# Patient Record
Sex: Female | Born: 1965 | Race: Black or African American | Hispanic: No | Marital: Single | Smoking: Former smoker
Health system: Southern US, Community
[De-identification: ages and names within clinical notes are randomized; demographics above are authoritative.]

## PROBLEM LIST (undated history)

## (undated) DIAGNOSIS — C8191 Hodgkin lymphoma, unspecified, lymph nodes of head, face, and neck: Secondary | ICD-10-CM

## (undated) DIAGNOSIS — R599 Enlarged lymph nodes, unspecified: Secondary | ICD-10-CM

## (undated) DIAGNOSIS — M62838 Other muscle spasm: Secondary | ICD-10-CM

## (undated) DIAGNOSIS — Z1231 Encounter for screening mammogram for malignant neoplasm of breast: Secondary | ICD-10-CM

## (undated) DIAGNOSIS — R3915 Urgency of urination: Secondary | ICD-10-CM

## (undated) DIAGNOSIS — I10 Essential (primary) hypertension: Secondary | ICD-10-CM

## (undated) DIAGNOSIS — Z01811 Encounter for preprocedural respiratory examination: Secondary | ICD-10-CM

## (undated) DIAGNOSIS — R221 Localized swelling, mass and lump, neck: Secondary | ICD-10-CM

## (undated) DIAGNOSIS — R7303 Prediabetes: Secondary | ICD-10-CM

## (undated) DIAGNOSIS — Z452 Encounter for adjustment and management of vascular access device: Secondary | ICD-10-CM

## (undated) DIAGNOSIS — IMO0002 Reserved for concepts with insufficient information to code with codable children: Secondary | ICD-10-CM

## (undated) DIAGNOSIS — Z13220 Encounter for screening for lipoid disorders: Secondary | ICD-10-CM

## (undated) DIAGNOSIS — R52 Pain, unspecified: Secondary | ICD-10-CM

## (undated) DIAGNOSIS — Z01818 Encounter for other preprocedural examination: Secondary | ICD-10-CM

## (undated) DIAGNOSIS — C8590 Non-Hodgkin lymphoma, unspecified, unspecified site: Secondary | ICD-10-CM

## (undated) DIAGNOSIS — M79674 Pain in right toe(s): Secondary | ICD-10-CM

## (undated) DIAGNOSIS — D649 Anemia, unspecified: Secondary | ICD-10-CM

## (undated) DIAGNOSIS — K219 Gastro-esophageal reflux disease without esophagitis: Secondary | ICD-10-CM

## (undated) DIAGNOSIS — R220 Localized swelling, mass and lump, head: Secondary | ICD-10-CM

## (undated) DIAGNOSIS — N893 Dysplasia of vagina, unspecified: Secondary | ICD-10-CM

## (undated) DIAGNOSIS — E669 Obesity, unspecified: Principal | ICD-10-CM

## (undated) DIAGNOSIS — C801 Malignant (primary) neoplasm, unspecified: Secondary | ICD-10-CM

## (undated) DIAGNOSIS — T8859XA Other complications of anesthesia, initial encounter: Secondary | ICD-10-CM

## (undated) DIAGNOSIS — J45909 Unspecified asthma, uncomplicated: Secondary | ICD-10-CM

## (undated) HISTORY — PX: PORTACATH PLACEMENT: SHX2246

## (undated) HISTORY — PX: OTHER SURGICAL HISTORY: SHX169

## (undated) HISTORY — PX: ABDOMINAL HYSTERECTOMY: SHX81

---

## 2009-03-24 LAB — HM PAP SMEAR

## 2009-03-25 LAB — HEPATITIS B SURFACE ANTIBODY
Hep B S Ab Interp: NEGATIVE — AB
Hep B S Ab: <0.1 Index — ABNORMAL LOW (ref >1.00)

## 2009-03-25 LAB — RPR
RPR: NONREACTIVE
RPR: NONREACTIVE

## 2009-03-25 LAB — HEP B SURFACE AB
Hep B surface Ab Interp.: NEGATIVE — AB
Hepatitis B surface Ab: <0.1 Index — ABNORMAL LOW (ref >1.00)

## 2009-03-25 LAB — HEP B SURFACE AG
Hep B surface Ag Interp.: NEGATIVE
Hepatitis B surface Ag: <0.1 Index (ref <1.00)

## 2009-03-27 LAB — HSV TYPE 2-SPECIFIC ABS, IGG W/REFL SUPPLEMENTAL TESTING: HSV-2 Glycoprotein, IgG: 6.4 IV

## 2009-05-13 LAB — HIV 1/2 AB SCREEN W RFLX CONFIRM
HIV 1/2 Interpretation: NONREACTIVE
HIV1/2 INTERPRETATION, HHIVI: NONREACTIVE

## 2011-04-24 MED ORDER — SERTRALINE 50 MG TAB
50 mg | ORAL_TABLET | Freq: Every day | ORAL | Status: DC
Start: 2011-04-24 — End: 2012-05-26

## 2011-04-24 NOTE — Progress Notes (Signed)
Subjective:   45 y.o. female for annual routine Pap and checkup.  No LMP recorded. Patient has had a hysterectomy.    Social History: has sex with males.  Pertinent past medical hstory: see below.    There are no active problems to display for this patient.    No past medical history on file.  Past Surgical History   Procedure Date   ??? Hx gyn      BTL   ??? Hx hysterectomy 2004     History   Substance Use Topics   ??? Smoking status: Current Everyday Smoker   ??? Smokeless tobacco: Not on file   ??? Alcohol Use: Yes        ROS:  Feeling well. No dyspnea or chest pain on exertion.  No abdominal pain, change in bowel habits, black or bloody stools.  No urinary tract symptoms. GYN ROS: no menses, no abnormal bleeding, pelvic pain or discharge, no breast pain or new or enlarging lumps on self exam. No neurological complaints.    Objective:   BP 150/97   Pulse 90   Wt 176 lb (79.833 kg)  The patient appears well, alert, oriented x 3, in no distress.  ENT normal.  Neck supple. No adenopathy or thyromegaly. PERLA. Lungs are clear, good air entry, no wheezes, rhonchi or rales. S1 and S2 normal, no murmurs, regular rate and rhythm. Abdomen soft without tenderness, guarding, mass or organomegaly. Extremities show no edema, normal peripheral pulses. Neurological is normal, no focal findings.    BREAST EXAM: breasts appear normal, no suspicious masses, no skin or nipple changes or axillary nodes    PELVIC EXAM: normal external genitalia, vulva, vagina, and adnexa; cx/uterus absent    Assessment/Plan:   45 yo g5p5 well woman  mammogram  pap smear of vagina  zoloft 50mg  po daily for anxiety; if not improved in 2 weeks, will increase to 100mg   return annually or prn  reviewed diet, exercise and weight control.

## 2011-04-24 NOTE — Progress Notes (Signed)
Addended by: Luanne Bras on: 04/24/2011 02:44 PM     Modules accepted: Level of Service

## 2011-04-24 NOTE — Patient Instructions (Addendum)
MyChart Activation    Thank you for requesting access to MyChart. Please follow the instructions below to securely access and download your online medical record. MyChart allows you to send messages to your doctor, view your test results, renew your prescriptions, schedule appointments, and more.    How Do I Sign Up?    1. In your internet browser, go to [email or web address]  2. Click on the First Time User? Click Here link in the Sign In box. You will be redirect to the New Member Sign Up page.  3. Enter your MyChart Access Code exactly as it appears below. You will not need to use this code after you???ve completed the sign-up process. If you do not sign up before the expiration date, you must request a new code.    MyChart Access Code: [email or web address]  Expires: 07/23/2011 11:49 AM (This is the date your MyChart access code will expire)    4. Enter the last four digits of your Social Security Number (xxxx) and Date of Birth (mm/dd/yyyy) as indicated and click Submit. You will be taken to the next sign-up page.  5. Create a MyChart ID. This will be your MyChart login ID and cannot be changed, so think of one that is secure and easy to remember.  6. Create a MyChart password. You can change your password at any time.  7. Enter your Password Reset Question and Answer. This can be used at a later time if you forget your password.   8. Enter your e-mail address. You will receive e-mail notification when new information is available in MyChart.  9. Click Sign Up. You can now view and download portions of your medical record.  10. Click the Download Summary menu link to download a portable copy of your medical information.    Additional Information    If you have questions, please visit the Frequently Asked Questions section of the MyChart website at [email or web address] Remember, MyChart is NOT to be used for urgent needs. For medical emergencies, dial 911.        Pap Test: After Your Visit   Your Care Instructions  The Pap test (also called a Pap smear) is a screening test for cancer of the cervix, which is the lower part of the uterus that opens into the vagina. The test can help your doctor find early changes in the cells that could lead to cancer.  The sample of cells taken during your test has been sent to a lab so that an expert can look at the cells. It usually takes a week or two to get the results back.  Follow-up care is a key part of your treatment and safety. Be sure to make and go to all appointments, and call your doctor if you are having problems. It???s also a good idea to know your test results and keep a list of the medicines you take.  What do the results mean?  ?? A normal result means that the test did not find any abnormal cells in the sample.  ?? An abnormal result can mean many things. Most of these are not cancer. The results of your test may be abnormal because:  ?? You have an infection of the vagina or cervix, such as a yeast infection.  ?? You have an IUD (intrauterine device for birth control).  ?? You have low estrogen levels after menopause that are causing the cells to change.  ?? You have cell changes that may be a sign of precancer  or cancer. The results are ranked based on how serious the changes might be.  There are many other reasons why you might not get a normal result. If the results were abnormal, you may need to get another test within a few weeks or months. If the results show changes that could be a sign of cancer, you may need a test called a colposcopy, which provides a more complete view of the cervix.  Sometimes the lab cannot use the sample because it does not contain enough cells or was not preserved well. If so, you may need to have the test again. This is not common, but it does happen from time to time.  When should you call for help?  Watch closely for changes in your health, and be sure to contact your doctor if:   ?? You have vaginal bleeding or pain for more than 2 days after the test. It is normal to have a small amount of bleeding for a day or two after the test.   Where can you learn more?   Go to [email or web address]  Enter [phone] in the search box to learn more about "Pap Test: After Your Visit."   ?? 2006-2012 Healthwise, Incorporated. Care instructions adapted under license by [employer] (which disclaims liability or warranty for this information). This care instruction is for use with your licensed healthcare professional. If you have questions about a medical condition or this instruction, always ask your healthcare professional. Health[employer], Incorporated disclaims any warranty or liability for your use of this information.  Content Version: 9.4.94723; Last Revised: January 30, 2010              Breast Cancer Screening: After Your Visit  Your Care Instructions  A breast X-ray (mammogram) and an exam by your doctor can help find breast cancer early, when it is easier to treat. If you are age 44 or older, ask your doctor when to start and how often to have a mammogram. The X-ray can spot tumors that are too small to be felt by hand. (It also can show harmless lumps, such as fluid-filled cysts).  During a breast exam, your doctor will feel your breasts for unusual lumps or any other possible signs of cancer. During a mammogram, a machine squeezes your breasts to make them flatter and easier to X-ray. You may feel some brief discomfort during the test. After the test, a doctor who is an expert at reading X-rays will study your mammogram. Your doctor will tell you the results and whether you need any follow-up tests.  Follow-up care is a key part of your treatment and safety. Be sure to make and go to all appointments, and call your doctor if you are having problems. It???s also a good idea to know your test results and keep a list of the medicines you take.  What should you do to get ready for a mammogram?   ?? If you are still having menstrual periods, have your mammogram done within 2 weeks after your menstrual period ends. The test will be more comfortable at this time than it would be before your period.  ?? On the day of the mammogram, do not use any deodorant, perfume, powders, or lotions on your breasts or armpits. They may affect the X-rays.  ?? Remove any jewelry. You will need to take off your clothes above the waist. You will put on a cloth or paper top. If you are concerned about an area of your breast,  show the technologist so that the area can be noted.  How can you care for yourself at home?  ?? If you have breast pain after the mammogram, take an over-the-counter pain medicine, such as acetaminophen (Tylenol), ibuprofen (Advil, Motrin), or naproxen (Aleve). Read and follow all instructions on the label.  ?? Do not take two or more pain medicines at the same time unless the doctor told you to. Many pain medicines have acetaminophen, which is Tylenol. Too much acetaminophen (Tylenol) can be harmful.  When should you call for help?  Watch closely for changes in your health, and be sure to contact your doctor if:  ?? You notice any changes in your breasts or the skin on your breasts. These may include lumps, fluid leaking suddenly from your nipples, or changes to the skin on your breast or nipple.   Where can you learn more?   Go to [email or web address]  Enter H706 in the search box to learn more about "Breast Cancer Screening: After Your Visit."   ?? 2006-2012 Healthwise, Incorporated. Care instructions adapted under license by [employer] (which disclaims liability or warranty for this information). This care instruction is for use with your licensed healthcare professional. If you have questions about a medical condition or this instruction, always ask your healthcare professional. Healthwise, Incorporated disclaims any warranty or liability for your use of this information.   Content Version: 9.4.94723; Last Revised: February 09, 2010

## 2011-04-26 NOTE — Progress Notes (Signed)
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 Please schedule for colposcopy of vagina. thanks  ______

## 2011-04-26 NOTE — Telephone Encounter (Signed)
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  Please schedule for colposcopy of vagina.  thanks

## 2011-04-26 NOTE — Telephone Encounter (Signed)
Left message on patient voicemail  to call the office.

## 2011-05-03 MED ORDER — HYDROCHLOROTHIAZIDE 25 MG TAB
25 mg | ORAL_TABLET | Freq: Every day | ORAL | Status: DC
Start: 2011-05-03 — End: 2011-05-20

## 2011-05-03 NOTE — Progress Notes (Signed)
HISTORY OF PRESENT ILLNESS    Melissa Leblanc is a 45 y.o. year old female who comes in today as a new patient to our practice to be seen for: Elevated blood pressure.    Hypertension ROS: no TIA's, no chest pain on exertion, no dyspnea on exertion, no swelling of ankles.   New concerns: none.   Past medications tried and failed none.    Does have a lot of stress and was started on Zoloft by OB about 1-2 weeks ago.    Current Outpatient Prescriptions on File Prior to Visit   Medication Sig Dispense Refill   ??? sertraline (ZOLOFT) 50 mg tablet Take 1 Tab by mouth daily.  90 Tab  5     Objective:   BP 142/100   Pulse 69   Temp(Src) 99 ??F (37.2 ??C) (Oral)   Resp 16   Ht 5\' 5"  (1.651 m)   Wt 176 lb (79.833 kg)   BMI 29.29 kg/m2   SpO2 98%  GEN:  Appears stated age in NAD.  HEENT: Conjunctiva/lids normal.  External ears and nose without lesions/trauma.  Hearing Intact.  Tongue midline.  NECK: Trachea midline.  Supple.  Full ROM  CARDIAC:  regular rate and rhythm. no Murmur, no peripheral edema.  LUNGS: lungs clear to auscultation, no accessory muscle use.  MS: no clubbing/cyanosis.  SKIN: Warm/dry without rash.    Assessment/Plan:  Encounter Diagnoses   Name Primary?   ??? HTN (hypertension) Yes   ??? Well woman exam      Orders Placed This Encounter   ??? LIPID PANEL     Standing Status: Future      Number of Occurrences:       Standing Expiration Date: 10/31/2011   ??? METABOLIC PANEL, COMPREHENSIVE     Standing Status: Future      Number of Occurrences:       Standing Expiration Date: 10/31/2011   ??? CBC WITH AUTOMATED DIFF   ??? hydrochlorothiazide (HYDRODIURIL) 25 mg tablet     Sig: Take 1 Tab by mouth daily.     Dispense:  30 Tab     Refill:  1     Will give DASH diet and discussed smoking cessation.    RTC 2 weeks.    Labs done fasting in 1 week.

## 2011-05-03 NOTE — Patient Instructions (Addendum)
DASH Diet: After Your Visit  Your Care Instructions  The DASH diet is an eating plan that can help lower your blood pressure. DASH stands for Dietary Approaches to Stop Hypertension. Hypertension is high blood pressure.  The DASH diet focuses on eating foods that are high in calcium, potassium, and magnesium. These nutrients can lower blood pressure. The foods that are highest in these nutrients are fruits, vegetables, low-fat dairy products, nuts, seeds, and legumes. But taking calcium, potassium, and magnesium supplements instead of eating foods that are high in those nutrients does not have the same effect. The DASH diet also includes whole grains, fish, and poultry.  The DASH diet is one of several lifestyle changes your doctor may recommend to lower your high blood pressure. Your doctor may also want you to decrease the amount of sodium in your diet. Lowering sodium while following the DASH diet can lower blood pressure even further than just the DASH diet alone.  Follow-up care is a key part of your treatment and safety. Be sure to make and go to all appointments, and call your doctor if you are having problems. It???s also a good idea to know your test results and keep a list of the medicines you take.  How can you care for yourself at home?  Following the DASH diet  ?? Eat 4 to 5 servings of fruit each day. A serving is 1 medium-sized piece of fruit, ?? cup chopped or canned fruit, 1/4 cup dried fruit, or 4 ounces (?? cup) of fruit juice. Choose fruit more often than fruit juice.   ?? Eat 4 to 5 servings of vegetables each day. A serving is 1 cup of lettuce or raw leafy vegetables, ?? cup of chopped or cooked vegetables, or 4 ounces (?? cup) of vegetable juice. Choose vegetables more often than vegetable juice.   ?? Get 2 to 3 servings of low-fat and fat-free dairy each day. A serving is 8 ounces of milk, 1 cup of yogurt, or 1 ?? ounces of cheese.    ?? Eat 7 to 8 servings of grains each day. A serving is 1 slice of bread, 1 ounce of dry cereal, or ?? cup of cooked rice, pasta, or cooked cereal. Try to choose whole-grain products as much as possible.   ?? Limit lean meat, poultry, and fish to 6 ounces each day. Six ounces is about the size of two decks of cards.   ?? Eat 4 to 5 servings of nuts, seeds, and legumes (cooked dried beans, lentils, and split peas) each week. A serving is 1/3 cup of nuts, 2 tablespoons of seeds, or ?? cup cooked dried beans or peas.   ?? Limit sweets and added sugars to 5 servings or less a week. A serving is 1 tablespoon jelly or jam, ?? cup sorbet, or 1 cup of lemonade.   Tips for success  ?? Start small. Do not try to make dramatic changes to your diet all at once. You might feel that you are missing out on your favorite foods and then be more likely to not follow the plan. Make small changes, and stick with them. Once those changes become habit, add a few more changes.   ?? Try some of the following:   ?? Make it a goal to eat a fruit or vegetable at every meal and at snacks. This will make it easy to get the recommended amount of fruits and vegetables each day.   ?? Try yogurt topped with   fruit and nuts for a snack or healthy dessert.   ?? Add lettuce, tomato, cucumber, and onion to sandwiches.   ?? Combine a ready-made pizza crust with low-fat mozzarella cheese and lots of vegetable toppings. Try using tomatoes, squash, spinach, broccoli, carrots, cauliflower, and onions.   ?? Have a variety of cut-up vegetables with a low-fat dip as an appetizer instead of chips and dip.   ?? Sprinkle sunflower seeds or chopped almonds over salads. Or try adding chopped walnuts or almonds to cooked vegetables.   ?? Try some vegetarian meals using beans and peas. Add garbanzo or kidney beans to salads. Make burritos and tacos with mashed pinto beans or black beans.     Where can you learn more?    Go to http://www.healthwise.net/[facility]    Enter H967 in the search box to learn more about "DASH Diet: After Your Visit."    ?? 2006-2012 Healthwise, Incorporated. Care instructions adapted under license by [employer] (which disclaims liability or warranty for this information). This care instruction is for use with your licensed healthcare professional. If you have questions about a medical condition or this instruction, always ask your healthcare professional. Healthwise, Incorporated disclaims any warranty or liability for your use of this information.  Content Version: 9.4.94723; Last Revised: November 01, 2010

## 2011-05-08 LAB — METABOLIC PANEL, COMPREHENSIVE
A-G Ratio: 1.2 (ref 1.1–2.5)
ALT (SGPT): 12 IU/L (ref 0–40)
AST (SGOT): 14 IU/L (ref 0–40)
Albumin: 4.2 g/dL (ref 3.5–5.5)
Alk. phosphatase: 65 IU/L (ref 25–150)
BUN/Creatinine ratio: 11 (ref 9–23)
BUN: 8 mg/dL (ref 6–24)
Bilirubin, total: 0.2 mg/dL (ref 0.0–1.2)
CO2: 22 mmol/L (ref 20–32)
Calcium: 9.9 mg/dL (ref 8.7–10.2)
Chloride: 101 mmol/L (ref 97–108)
Creatinine: 0.74 mg/dL (ref 0.57–1.00)
GFR est non-AA: 99 mL/min/{1.73_m2} (ref >59)
GLOBULIN, TOTAL: 3.5 g/dL (ref 1.5–4.5)
Glucose: 106 mg/dL — ABNORMAL HIGH (ref 65–99)
Potassium: 4.8 mmol/L (ref 3.5–5.2)
Protein, total: 7.7 g/dL (ref 6.0–8.5)
Sodium: 140 mmol/L (ref 134–144)
eGFR If African American: 114 mL/min/{1.73_m2} (ref >59)

## 2011-05-08 LAB — LIPID PANEL
Cholesterol, total: 207 mg/dL — ABNORMAL HIGH (ref 100–199)
HDL Cholesterol: 53 mg/dL (ref >39)
LDL, calculated: 130 mg/dL — ABNORMAL HIGH (ref 0–99)
Triglyceride: 121 mg/dL (ref 0–149)
VLDL, calculated: 24 mg/dL (ref 5–40)

## 2011-05-08 NOTE — Telephone Encounter (Signed)
Called patient left message on her home number reminding her to keep her 05/17/11 appointment so that Dr. Sidney Ace can recheck her HTN and also go over her lab results that day.

## 2011-05-08 NOTE — Telephone Encounter (Signed)
Message copied by Melina Schools on Wed May 08, 2011  8:42 AM  ------       Message from: [facility]       Created: Wed May 08, 2011  8:17 AM         Please let Pt know needs to return for appt to discuss lab results (prediabetes, lipids).

## 2011-05-08 NOTE — Progress Notes (Signed)
Quick Note:    Patient has follow up on 05/17/11, called and reminded pt to make sure she makes it.  ______

## 2011-05-10 NOTE — Progress Notes (Signed)
45 yo g5p5 presents for colposcopy.  Recent pap of vagina - vain 1    Pe: There were no vitals taken for this visit.  Nad, rrr, ctab, abd soft nt nd  Extr: no c/c/e  V/v: awe at after application of acetic acid.  bx taken at vaginal cuff approx 11:00    A/P: 45 yo g5p5 with vain  1.  bx pending.  F/u with pap in 6 months; repeat colpo if vain2 +    jc

## 2011-05-10 NOTE — Patient Instructions (Addendum)
MyChart Activation    Thank you for requesting access to MyChart. Please follow the instructions below to securely access and download your online medical record. MyChart allows you to send messages to your doctor, view your test results, renew your prescriptions, schedule appointments, and more.    How Do I Sign Up?    1. In your internet browser, go to [email or web address]  2. Click on the First Time User? Click Here link in the Sign In box. You will be redirect to the New Member Sign Up page.  3. Enter your MyChart Access Code exactly as it appears below. You will not need to use this code after you???ve completed the sign-up process. If you do not sign up before the expiration date, you must request a new code.    MyChart Access Code: [email or web address]  Expires: 07/23/2011 11:49 AM (This is the date your MyChart access code will expire)    4. Enter the last four digits of your Social Security Number (xxxx) and Date of Birth (mm/dd/yyyy) as indicated and click Submit. You will be taken to the next sign-up page.  5. Create a MyChart ID. This will be your MyChart login ID and cannot be changed, so think of one that is secure and easy to remember.  6. Create a MyChart password. You can change your password at any time.  7. Enter your Password Reset Question and Answer. This can be used at a later time if you forget your password.   8. Enter your e-mail address. You will receive e-mail notification when new information is available in MyChart.  9. Click Sign Up. You can now view and download portions of your medical record.  10. Click the Download Summary menu link to download a portable copy of your medical information.    Additional Information    If you have questions, please visit the Frequently Asked Questions section of the MyChart website at [email or web address] Remember, MyChart is NOT to be used for urgent needs. For medical emergencies, dial 911.      Colposcopy: What to Expect at Home   Your Recovery  You may feel some soreness in your vagina for a day or two if you had a biopsy. Some vaginal bleeding or discharge is normal for up to a week after a biopsy. The discharge may be dark-colored if a solution was put on your cervix. You can use a sanitary pad for the bleeding.  It may take a week or two for you to get the test results.  This care sheet gives you a general idea about how long it will take for you to recover. But each person recovers at a different pace. Follow the steps below to feel better as quickly as possible.  How can you care for yourself at home?  Activity  ?? You can return to work and most daily activities right after the test.   Exercise  ?? Do not exercise for 1 day after the test.   Medicines  ?? Take an over-the-counter pain medicine, such as acetaminophen (Tylenol), ibuprofen (Advil, Motrin), or naproxen (Aleve). Read and follow all instructions on the label. Do not take two or more pain medicines at the same time unless the doctor told you to. Many pain medicines have acetaminophen, which is Tylenol. Too much acetaminophen (Tylenol) can be harmful.   Other instructions  ?? Use a pad if you have some bleeding.   ?? Do not douche, have sexual intercourse, or use tampons for 1 week  if you had a biopsy. This will allow time for your cervix to heal.   ?? You can take a bath or shower anytime after the test.   Follow-up care is a key part of your treatment and safety. Be sure to make and go to all appointments, and call your doctor if you are having problems. It's also a good idea to know your test results and keep a list of the medicines you take.  When should you call for help?  Call your doctor now or seek immediate medical care if:  ?? You have severe vaginal bleeding. You are passing clots of blood and soaking through your usual pads or tampons each hour for 2 or more hours.   ?? You have pain that does not get better after you take pain medicine.    ?? You have signs of infection, such as:   ?? Increased pain.   ?? Bad-smelling vaginal discharge.   ?? A fever.   Watch closely for any changes in your health, and be sure to contact your doctor if:  ?? You have questions or concerns.     Where can you learn more?    Go to [email or web address]   Enter M523 in the search box to learn more about "Colposcopy: What to Expect at Home."    ?? 2006-2012 Healthwise, Incorporated. Care instructions adapted under license by [employer] (which disclaims liability or warranty for this information). This care instruction is for use with your licensed healthcare professional. If you have questions about a medical condition or this instruction, always ask your healthcare professional. Healthwise, Incorporated disclaims any warranty or liability for your use of this information.  Content Version: 9.4.94723; Last Revised: April 20, 2010

## 2011-05-14 NOTE — Progress Notes (Signed)
Quick Note:    Vaginal bx is normal  ______

## 2011-05-16 NOTE — Telephone Encounter (Signed)
Message copied by Gardiner Rhyme on Thu May 16, 2011  2:16 PM  ------       Message from: Sheran Luz B       Created: Tue May 14, 2011  5:55 PM         Vaginal bx is normal

## 2011-05-16 NOTE — Telephone Encounter (Signed)
Message copied by Gardiner Rhyme on Thu May 16, 2011  9:39 AM  ------       Message from: Sheran Luz B       Created: Tue May 14, 2011  5:55 PM         Vaginal bx is normal

## 2011-05-16 NOTE — Telephone Encounter (Signed)
Spoke with patient informed her that her cervical biopsy was normal. Pt verbalized understanding.

## 2011-05-16 NOTE — Telephone Encounter (Signed)
Left message on patient voicemail to call the office.

## 2011-05-20 MED ORDER — LISINOPRIL-HYDROCHLOROTHIAZIDE 20 MG-25 MG TAB
20-25 mg | ORAL_TABLET | Freq: Every day | ORAL | Status: DC
Start: 2011-05-20 — End: 2011-09-10

## 2011-05-20 NOTE — Patient Instructions (Signed)
DASH Diet: After Your Visit  Your Care Instructions  The DASH diet is an eating plan that can help lower your blood pressure. DASH stands for Dietary Approaches to Stop Hypertension. Hypertension is high blood pressure.  The DASH diet focuses on eating foods that are high in calcium, potassium, and magnesium. These nutrients can lower blood pressure. The foods that are highest in these nutrients are fruits, vegetables, low-fat dairy products, nuts, seeds, and legumes. But taking calcium, potassium, and magnesium supplements instead of eating foods that are high in those nutrients does not have the same effect. The DASH diet also includes whole grains, fish, and poultry.  The DASH diet is one of several lifestyle changes your doctor may recommend to lower your high blood pressure. Your doctor may also want you to decrease the amount of sodium in your diet. Lowering sodium while following the DASH diet can lower blood pressure even further than just the DASH diet alone.  Follow-up care is a key part of your treatment and safety. Be sure to make and go to all appointments, and call your doctor if you are having problems. It???s also a good idea to know your test results and keep a list of the medicines you take.  How can you care for yourself at home?  Following the DASH diet  ?? Eat 4 to 5 servings of fruit each day. A serving is 1 medium-sized piece of fruit, ?? cup chopped or canned fruit, 1/4 cup dried fruit, or 4 ounces (?? cup) of fruit juice. Choose fruit more often than fruit juice.   ?? Eat 4 to 5 servings of vegetables each day. A serving is 1 cup of lettuce or raw leafy vegetables, ?? cup of chopped or cooked vegetables, or 4 ounces (?? cup) of vegetable juice. Choose vegetables more often than vegetable juice.   ?? Get 2 to 3 servings of low-fat and fat-free dairy each day. A serving is 8 ounces of milk, 1 cup of yogurt, or 1 ?? ounces of cheese.    ?? Eat 7 to 8 servings of grains each day. A serving is 1 slice of bread, 1 ounce of dry cereal, or ?? cup of cooked rice, pasta, or cooked cereal. Try to choose whole-grain products as much as possible.   ?? Limit lean meat, poultry, and fish to 6 ounces each day. Six ounces is about the size of two decks of cards.   ?? Eat 4 to 5 servings of nuts, seeds, and legumes (cooked dried beans, lentils, and split peas) each week. A serving is 1/3 cup of nuts, 2 tablespoons of seeds, or ?? cup cooked dried beans or peas.   ?? Limit sweets and added sugars to 5 servings or less a week. A serving is 1 tablespoon jelly or jam, ?? cup sorbet, or 1 cup of lemonade.   Tips for success  ?? Start small. Do not try to make dramatic changes to your diet all at once. You might feel that you are missing out on your favorite foods and then be more likely to not follow the plan. Make small changes, and stick with them. Once those changes become habit, add a few more changes.   ?? Try some of the following:   ?? Make it a goal to eat a fruit or vegetable at every meal and at snacks. This will make it easy to get the recommended amount of fruits and vegetables each day.   ?? Try yogurt topped with   fruit and nuts for a snack or healthy dessert.   ?? Add lettuce, tomato, cucumber, and onion to sandwiches.   ?? Combine a ready-made pizza crust with low-fat mozzarella cheese and lots of vegetable toppings. Try using tomatoes, squash, spinach, broccoli, carrots, cauliflower, and onions.   ?? Have a variety of cut-up vegetables with a low-fat dip as an appetizer instead of chips and dip.   ?? Sprinkle sunflower seeds or chopped almonds over salads. Or try adding chopped walnuts or almonds to cooked vegetables.   ?? Try some vegetarian meals using beans and peas. Add garbanzo or kidney beans to salads. Make burritos and tacos with mashed pinto beans or black beans.     Where can you learn more?    Go to http://www.healthwise.net/[facility]    Enter H967 in the search box to learn more about "DASH Diet: After Your Visit."    ?? 2006-2012 Healthwise, Incorporated. Care instructions adapted under license by [place] (which disclaims liability or warranty for this information). This care instruction is for use with your licensed healthcare professional. If you have questions about a medical condition or this instruction, always ask your healthcare professional. Healthwise, Incorporated disclaims any warranty or liability for your use of this information.  Content Version: 9.4.94723; Last Revised: November 01, 2010          Prediabetes: After Your Visit  Your Care Instructions  Prediabetes is a warning sign that you are at risk for getting type 2 diabetes. It means that your blood sugar is higher than it should be. Most people who get type 2 diabetes have prediabetes first. The good news is that lifestyle changes may help you get your blood sugar back to normal and avoid or delay diabetes. Also, pregnant women who get gestational diabetes may have prediabetes first.  Type 2 diabetes is a lifelong disease in which the body does not respond properly to a hormone called insulin or does not make enough of the hormone. Insulin helps sugar from your food enter your body cells to be used as energy.  Without insulin, the sugar cannot get into the cells to do its work. It stays in the blood instead. This can cause high blood sugar levels. A person has diabetes when the blood sugar stays too high too much of the time.  Follow-up care is a key part of your treatment and safety. Be sure to make and go to all appointments, and call your doctor if you are having problems. It???s also a good idea to know your test results and keep a list of the medicines you take.  How can you care for yourself at home?  ?? Watch your weight. A healthy weight helps your body use insulin properly.    ?? Eat a balanced diet. This may help you prevent or delay diabetes. Try to eat an even amount of carbohydrate throughout the day. This can help you avoid sudden peaks in blood sugar.   ?? Ask your doctor if you should see a dietitian. A registered dietitian can help you develop a meal plan that fits your lifestyle.   ?? Get at least 30 minutes of exercise on most days of the week. Exercise helps control your blood sugar. It also helps you maintain a healthy weight. Walking is a good choice. You also may want to do other activities, such as running, swimming, cycling, or playing tennis or team sports.   ?? Do not smoke. Smoking can make prediabetes worse. If you   need help quitting, talk to your doctor about stop-smoking programs and medicines. These can increase your chances of quitting for good.   ?? If your doctor prescribed medicines, take them exactly as prescribed. Call your doctor if you think you are having a problem with your medicine. You will get more details on the specific medicines your doctor prescribes.   When should you call for help?  Watch closely for changes in your health, and be sure to contact your doctor if:  ?? You have any symptoms of diabetes. These may include:   ?? Being thirsty more often.   ?? Urinating more.   ?? Being hungrier.   ?? Losing weight.   ?? Being very tired.   ?? Having blurry vision.   ?? You have a wound that will not heal.   ?? You have an infection that will not go away.   ?? You have problems with your blood pressure.   ?? You want more information about diabetes and how you can keep from getting it.     Where can you learn more?    Go to http://www.healthwise.net/[facility]   Enter I222 in the search box to learn more about "Prediabetes: After Your Visit."     ?? 2006-2012 Healthwise, Incorporated. Care instructions adapted under license by [employer] (which disclaims liability or warranty for this information). This care instruction is for use with your licensed healthcare professional. If you have questions about a medical condition or this instruction, always ask your healthcare professional. Healthwise, Incorporated disclaims any warranty or liability for your use of this information.  Content Version: 9.4.94723; Last Revised: September 05, 2009

## 2011-05-20 NOTE — Progress Notes (Signed)
Subjective:   Melissa Leblanc is a 45 y.o. female with hypertension.    Hypertension ROS: taking medications as instructed, no medication side effects noted, no TIA's, no chest pain on exertion, no dyspnea on exertion, no swelling of ankles.  Never Tx with any other BP med.  New concerns: none.     Current Outpatient Prescriptions   Medication Sig Dispense Refill   ??? hydrochlorothiazide (HYDRODIURIL) 25 mg tablet Take 1 Tab by mouth daily.  30 Tab  1   ??? sertraline (ZOLOFT) 50 mg tablet Take 1 Tab by mouth daily.  90 Tab  5      Patient Active Problem List   Diagnoses Code   ??? 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     Lab Results   Component Value Date/Time    Cholesterol, total 207 05/07/2011  7:57 AM    HDL Cholesterol 53 05/07/2011  7:57 AM    LDL, calculated 130 05/07/2011  7:57 AM    VLDL, calculated 24 05/07/2011  7:57 AM    Triglyceride 121 05/07/2011  7:57 AM     Lab Results   Component Value Date/Time    Sodium 140 05/07/2011  7:57 AM    Potassium 4.8 05/07/2011  7:57 AM    Chloride 101 05/07/2011  7:57 AM    CO2 22 05/07/2011  7:57 AM    Glucose 106 05/07/2011  7:57 AM    BUN 8 05/07/2011  7:57 AM    Creatinine 0.74 05/07/2011  7:57 AM    BUN/Creatinine ratio 11 05/07/2011  7:57 AM    GFR est non-AA 99 05/07/2011  7:57 AM    Calcium 9.9 05/07/2011  7:57 AM    Bilirubin, total 0.2 05/07/2011  7:57 AM    ALT 12 05/07/2011  7:57 AM    AST 14 05/07/2011  7:57 AM    Alk. phosphatase 65 05/07/2011  7:57 AM    Protein, total 7.7 05/07/2011  7:57 AM    Albumin 4.2 05/07/2011  7:57 AM    A-G Ratio 1.2 05/07/2011  7:57 AM     Wt Readings from Last 3 Encounters:   05/20/11 172 lb (78.019 kg)   05/03/11 176 lb (79.833 kg)   04/24/11 176 lb (79.833 kg)       Objective:   BP 155/92   Pulse 85   Temp(Src) 97.7 ??F (36.5 ??C) (Oral)   Resp 16   Ht 5\' 5"  (1.651 m)   Wt 172 lb (78.019 kg)   BMI 28.62 kg/m2   SpO2 98%    BP 155/92   Pulse 85   Temp(Src) 97.7 ??F (36.5 ??C) (Oral)   Resp 16   Ht 5\' 5"  (1.651 m)   Wt 172 lb (78.019 kg)   BMI 28.62 kg/m2   SpO2 98%  GEN:  Appears stated age in NAD.  HEENT: Conjunctiva/lids normal.  External ears and nose without lesions/trauma.  Hearing Intact.  Tongue midline.  NECK: Trachea midline.  Supple.  Full ROM  CARDIAC:  regular rate and rhythm. no Murmur, no peripheral edema.  LUNGS: lungs clear to auscultation, no accessory muscle use.  MS: no clubbing/cyanosis.  SKIN: Warm/dry without rash.    Labs from 05/07/11 show Pre DM (Fasting glucose 106, LDL 130)     Assessment:    Encounter Diagnoses   Name Primary?   ??? HTN (hypertension) Yes   ??? Prediabetes      HTN: uncontrolled    Plan:  Orders Placed This Encounter   ??? lisinopril-hydrochlorothiazide (PRINZIDE, ZESTORETIC) 20-25 mg per tablet     Sig: Take 1 Tab by mouth daily.     Dispense:  30 Tab     Refill:  3     Discussed lifestyle changes.    Start exercise 1/2 per day.    RTC 2 weeks.

## 2011-06-03 DIAGNOSIS — R7303 Prediabetes: Secondary | ICD-10-CM | POA: Insufficient documentation

## 2011-06-03 DIAGNOSIS — I10 Essential (primary) hypertension: Secondary | ICD-10-CM | POA: Insufficient documentation

## 2011-06-03 NOTE — Patient Instructions (Signed)
Elevated Blood Pressure: After Your Visit  Your Care Instructions  Blood pressure is a measure of how hard the blood pushes against the walls of your arteries. It's normal for blood pressure to go up and down throughout the day. But if it stays up over time, you have high blood pressure.  Two numbers tell you your blood pressure. The first number is the systolic pressure. It shows how hard the blood pushes when your heart is pumping. The second number is the diastolic pressure. It shows how hard the blood pushes between heartbeats, when your heart is relaxed and filling with blood. A normal blood pressure in adults is less than 120/80 (say "120 over 80"). High blood pressure is 140/90 or higher.  The main test for high blood pressure is simple, fast, and painless. To diagnose high blood pressure, your doctor will test your blood pressure at different times. You may have to check your blood pressure at home if there is reason to think that the results in the doctor's office aren't accurate.  If you are diagnosed with high blood pressure, you can work with your doctor to make a long-term plan to manage it.  Follow-up care is a key part of your treatment and safety. Be sure to make and go to all appointments, and call your doctor if you are having problems. It's also a good idea to know your test results and keep a list of the medicines you take.  How can you care for yourself at home?  ?? Do not smoke. Smoking increases your risk for heart attack and stroke. If you need help quitting, talk to your doctor about stop-smoking programs and medicines. These can increase your chances of quitting for good.   ?? Stay at a healthy weight.   ?? Limit sodium.   ?? Be physically active. Get at least 30 minutes of exercise on most days of the week. Walking is a good choice. You also may want to do other activities, such as running, swimming, cycling, or playing tennis or team sports.    ?? Avoid or limit alcohol. Talk to your doctor about whether you can drink any alcohol.   ?? Eat plenty of fruits, vegetables, and low-fat dairy products. Eat less saturated and total fats.   ?? Learn how to check your blood pressure at home.   When should you call for help?  Call your doctor now or seek immediate medical care if:  ?? Your blood pressure is much higher than normal (such as 180/110 or higher).   ?? You think high blood pressure is causing symptoms such as:   ?? Severe headache.   ?? Blurry vision.   ?? Nausea or vomiting.   Watch closely for changes in your health, and be sure to contact your doctor if:  ?? You do not get better as expected.     Where can you learn more?    Go to [email or web address]   Enter [id number] in the search box to learn more about "Elevated Blood Pressure: After Your Visit."    ?? 2006-2012 Healthwise, Incorporated. Care instructions adapted under license by [employer] (which disclaims liability or warranty for this information). This care instruction is for use with your licensed healthcare professional. If you have questions about a medical condition or this instruction, always ask your healthcare professional. [employer] any warranty or liability for your use of this information.  Content Version: 9.4.94723; Last Revised: December 12, 2010  DASH Diet: After Your Visit  Your Care Instructions  The DASH diet is an eating plan that can help lower your blood pressure. DASH stands for Dietary Approaches to Stop Hypertension. Hypertension is high blood pressure.   The DASH diet focuses on eating foods that are high in calcium, potassium, and magnesium. These nutrients can lower blood pressure. The foods that are highest in these nutrients are fruits, vegetables, low-fat dairy products, nuts, seeds, and legumes. But taking calcium, potassium, and magnesium supplements instead of eating foods that are high in those nutrients does not have the same effect. The DASH diet also includes whole grains, fish, and poultry.  The DASH diet is one of several lifestyle changes your doctor may recommend to lower your high blood pressure. Your doctor may also want you to decrease the amount of sodium in your diet. Lowering sodium while following the DASH diet can lower blood pressure even further than just the DASH diet alone.  Follow-up care is a key part of your treatment and safety. Be sure to make and go to all appointments, and call your doctor if you are having problems. It???s also a good idea to know your test results and keep a list of the medicines you take.  How can you care for yourself at home?  Following the DASH diet  ?? Eat 4 to 5 servings of fruit each day. A serving is 1 medium-sized piece of fruit, ?? cup chopped or canned fruit, 1/4 cup dried fruit, or 4 ounces (?? cup) of fruit juice. Choose fruit more often than fruit juice.   ?? Eat 4 to 5 servings of vegetables each day. A serving is 1 cup of lettuce or raw leafy vegetables, ?? cup of chopped or cooked vegetables, or 4 ounces (?? cup) of vegetable juice. Choose vegetables more often than vegetable juice.   ?? Get 2 to 3 servings of low-fat and fat-free dairy each day. A serving is 8 ounces of milk, 1 cup of yogurt, or 1 ?? ounces of cheese.   ?? Eat 7 to 8 servings of grains each day. A serving is 1 slice of bread, 1 ounce of dry cereal, or ?? cup of cooked rice, pasta, or cooked cereal. Try to choose whole-grain products as much as possible.    ?? Limit lean meat, poultry, and fish to 6 ounces each day. Six ounces is about the size of two decks of cards.   ?? Eat 4 to 5 servings of nuts, seeds, and legumes (cooked dried beans, lentils, and split peas) each week. A serving is 1/3 cup of nuts, 2 tablespoons of seeds, or ?? cup cooked dried beans or peas.   ?? Limit sweets and added sugars to 5 servings or less a week. A serving is 1 tablespoon jelly or jam, ?? cup sorbet, or 1 cup of lemonade.   Tips for success  ?? Start small. Do not try to make dramatic changes to your diet all at once. You might feel that you are missing out on your favorite foods and then be more likely to not follow the plan. Make small changes, and stick with them. Once those changes become habit, add a few more changes.   ?? Try some of the following:   ?? Make it a goal to eat a fruit or vegetable at every meal and at snacks. This will make it easy to get the recommended amount of fruits and vegetables each day.   ?? Try yogurt topped   with fruit and nuts for a snack or healthy dessert.   ?? Add lettuce, tomato, cucumber, and onion to sandwiches.   ?? Combine a ready-made pizza crust with low-fat mozzarella cheese and lots of vegetable toppings. Try using tomatoes, squash, spinach, broccoli, carrots, cauliflower, and onions.   ?? Have a variety of cut-up vegetables with a low-fat dip as an appetizer instead of chips and dip.   ?? Sprinkle sunflower seeds or chopped almonds over salads. Or try adding chopped walnuts or almonds to cooked vegetables.   ?? Try some vegetarian meals using beans and peas. Add garbanzo or kidney beans to salads. Make burritos and tacos with mashed pinto beans or black beans.     Where can you learn more?    Go to [email or web address]   Enter H967 in the search box to learn more about "DASH Diet: After Your Visit."     ?? 2006-2012 Healthwise, Incorporated. Care instructions adapted under license by [employer] (which disclaims liability or warranty for this information). This care instruction is for use with your licensed healthcare professional. If you have questions about a medical condition or this instruction, always ask your healthcare professional. Healthwise, Inc[employer] disclaims any warranty or liability for your use of this information.  Content Version: 9.4.94723; Last Revised: November 01, 2010          Prediabetes: After Your Visit  Your Care Instructions  Prediabetes is a warning sign that you are at risk for getting type 2 diabetes. It means that your blood sugar is higher than it should be. Most people who get type 2 diabetes have prediabetes first. The good news is that lifestyle changes may help you get your blood sugar back to normal and avoid or delay diabetes. Also, pregnant women who get gestational diabetes may have prediabetes first.  Type 2 diabetes is a lifelong disease in which the body does not respond properly to a hormone called insulin or does not make enough of the hormone. Insulin helps sugar from your food enter your body cells to be used as energy.  Without insulin, the sugar cannot get into the cells to do its work. It stays in the blood instead. This can cause high blood sugar levels. A person has diabetes when the blood sugar stays too high too much of the time.  Follow-up care is a key part of your treatment and safety. Be sure to make and go to all appointments, and call your doctor if you are having problems. It???s also a good idea to know your test results and keep a list of the medicines you take.  How can you care for yourself at home?  ?? Watch your weight. A healthy weight helps your body use insulin properly.    ?? Eat a balanced diet. This may help you prevent or delay diabetes. Try to eat an even amount of carbohydrate throughout the day. This can help you avoid sudden peaks in blood sugar.   ?? Ask your doctor if you should see a dietitian. A registered dietitian can help you develop a meal plan that fits your lifestyle.   ?? Get at least 30 minutes of exercise on most days of the week. Exercise helps control your blood sugar. It also helps you maintain a healthy weight. Walking is a good choice. You also may want to do other activities, such as running, swimming, cycling, or playing tennis or team sports.   ?? Do not smoke. Smoking can make prediabetes worse. If  you need help quitting, talk to your doctor about stop-smoking programs and medicines. These can increase your chances of quitting for good.   ?? If your doctor prescribed medicines, take them exactly as prescribed. Call your doctor if you think you are having a problem with your medicine. You will get more details on the specific medicines your doctor prescribes.   When should you call for help?  Watch closely for changes in your health, and be sure to contact your doctor if:  ?? You have any symptoms of diabetes. These may include:   ?? Being thirsty more often.   ?? Urinating more.   ?? Being hungrier.   ?? Losing weight.   ?? Being very tired.   ?? Having blurry vision.   ?? You have a wound that will not heal.   ?? You have an infection that will not go away.   ?? You have problems with your blood pressure.   ?? You want more information about diabetes and how you can keep from getting it.     Where can you learn more?    Go to [email or web address]   Enter I222 in the search box to learn more about "Prediabetes: After Your Visit."     ?? 2006-2012 Healthwise, Incorporated. Care instructions adapted under license by [employer] (which disclaims liability or warranty for this information). This care instruction is for use with your licensed healthcare professional. If you have questions about a medical condition or this instruction, always ask your healthcare professional. Health[employer], Inc[employer] disclaims any warranty or liability for your use of this information.  Content Version: 9.4.94723; Last Revised: September 05, 2009

## 2011-06-03 NOTE — Progress Notes (Signed)
Subjective:   Melissa Leblanc is a 45 y.o. female with hypertension, prediabetes.    Hypertension ROS: taking medications as instructed, no medication side effects noted, no TIA's, no chest pain on exertion, no dyspnea on exertion, no swelling of ankles.   No issues with med for last 2 weeks.  Not much in the way of exercise yet.    Other symptoms and concerns: none, does admit to some salty foods, but much improved.  Does admit to eating a lot of "junk" from time to time.  Past medications tried and failed none.    Current Outpatient Prescriptions   Medication Sig Dispense Refill   ??? lisinopril-hydrochlorothiazide (PRINZIDE, ZESTORETIC) 20-25 mg per tablet Take 1 Tab by mouth daily.  30 Tab  3   ??? sertraline (ZOLOFT) 50 mg tablet Take 1 Tab by mouth daily.  90 Tab  5      Past Medical History   Diagnosis Date   ??? Hypertension      Family History   Problem Relation Age of Onset   ??? Cancer Mother    ??? Breast Cancer Mother    ??? Cancer Father    ??? Alcohol abuse Neg Hx    ??? Arthritis-rheumatoid Neg Hx    ??? Asthma Neg Hx    ??? Bleeding Prob Neg Hx    ??? Diabetes Neg Hx    ??? Elevated Lipids Neg Hx    ??? Headache Neg Hx    ??? Heart Disease Neg Hx    ??? Hypertension Neg Hx    ??? Lung Disease Neg Hx    ??? Migraines Neg Hx    ??? Psychiatric Disorder Neg Hx    ??? Stroke Neg Hx    ??? Mental Retardation Neg Hx      Lab Results   Component Value Date/Time    Cholesterol, total 207 05/07/2011  7:57 AM    HDL Cholesterol 53 05/07/2011  7:57 AM    LDL, calculated 130 05/07/2011  7:57 AM    VLDL, calculated 24 05/07/2011  7:57 AM    Triglyceride 121 05/07/2011  7:57 AM     Lab Results   Component Value Date/Time    Sodium 140 05/07/2011  7:57 AM    Potassium 4.8 05/07/2011  7:57 AM    Chloride 101 05/07/2011  7:57 AM    CO2 22 05/07/2011  7:57 AM    Glucose 106 05/07/2011  7:57 AM    BUN 8 05/07/2011  7:57 AM    Creatinine 0.74 05/07/2011  7:57 AM    BUN/Creatinine ratio 11 05/07/2011  7:57 AM    GFR est non-AA 99 05/07/2011  7:57 AM     Calcium 9.9 05/07/2011  7:57 AM    Bilirubin, total 0.2 05/07/2011  7:57 AM    ALT 12 05/07/2011  7:57 AM    AST 14 05/07/2011  7:57 AM    Alk. phosphatase 65 05/07/2011  7:57 AM    Protein, total 7.7 05/07/2011  7:57 AM    Albumin 4.2 05/07/2011  7:57 AM    A-G Ratio 1.2 05/07/2011  7:57 AM     Wt Readings from Last 3 Encounters:   06/03/11 172 lb (78.019 kg)   05/20/11 172 lb (78.019 kg)   05/03/11 176 lb (79.833 kg)     Objective:   BP 116/78   Pulse 80   Temp(Src) 100.7 ??F (38.2 ??C) (Oral)   Resp 16   Ht 5\' 5"  (1.651 m)  Wt 172 lb (78.019 kg)   BMI 28.62 kg/m2   SpO2 98%    GEN:  Appears stated age in NAD.  CARDIAC:  RRR S1S2. No Murmur, No peripheral edema.  LUNGS: CTAB w/ normal effort.  MS: No clubbing/cyanosis.    Assessment:    Hypertension well controlled, improved.   Prediabetes    Plan:   current treatment plan is effective, no change in therapy  reviewed diet, exercise and weight control  recommended sodium restriction.    Recheck BMP (lisinopril).    RCT 3 months.

## 2011-06-04 LAB — METABOLIC PANEL, BASIC
BUN/Creatinine ratio: 10 (ref 9–23)
BUN: 8 mg/dL (ref 6–24)
CO2: 22 mmol/L (ref 20–32)
Calcium: 9.1 mg/dL (ref 8.7–10.2)
Chloride: 99 mmol/L (ref 97–108)
Creatinine: 0.78 mg/dL (ref 0.57–1.00)
GFR est non-AA: 93 mL/min/{1.73_m2} (ref >59)
Glucose: 66 mg/dL (ref 65–99)
Potassium: 4.1 mmol/L (ref 3.5–5.2)
Sodium: 136 mmol/L (ref 134–144)
eGFR If African American: 107 mL/min/{1.73_m2} (ref >59)

## 2011-06-04 NOTE — Progress Notes (Signed)
Quick Note:    Letter sent  ______

## 2011-06-04 NOTE — Progress Notes (Signed)
Quick Note:    Please send letter or call Pt and let them know labs are within normal limits. Blood sugar is looking good and blood pressure med is causing no problems. Keep up the good work and we'll see her in 3 months.  ______

## 2011-09-10 MED ORDER — LISINOPRIL-HYDROCHLOROTHIAZIDE 20 MG-25 MG TAB
20-25 mg | ORAL_TABLET | Freq: Every day | ORAL | Status: DC
Start: 2011-09-10 — End: 2012-02-07

## 2011-09-10 NOTE — Patient Instructions (Signed)
Low Back Pain: Exercises  Your Care Instructions  Here are some examples of typical rehabilitation exercises for your condition. Start each exercise slowly. Ease off the exercise if you start to have pain.  Your doctor or physical therapist will tell you when you can start these exercises and which ones will work best for you.  How to do the exercises  Press-up    1. Lie on your stomach, supporting your body with your forearms.   2. Press your elbows down into the floor to raise your upper back. As you do this, relax your stomach muscles and allow your back to arch without using your back muscles. As your press up, do not let your hips or pelvis come off the floor.   3. Hold for 15 to 30 seconds, then relax.   4. Repeat 2 to 4 times.   Alternate arm and leg (bird dog) exercise    Note: Do this exercise slowly. Try to keep your body straight at all times, and do not let one hip drop lower than the other.  1. Start on the floor, on your hands and knees.   2. Tighten your belly muscles.   3. Raise one leg off the floor, and hold it straight out behind you. Be careful not to let your hip drop down, because that will twist your trunk.   4. Hold for about 6 seconds, then lower your leg and switch to the other leg.   5. Repeat 8 to 12 times on each leg.   6. Over time, work up to holding for 10 to 30 seconds each time.   7. If you feel stable and secure with your leg raised, try raising the opposite arm straight out in front of you at the same time.   Knee-to-chest exercise    1. Lie on your back with your knees bent and your feet flat on the floor.   2. Bring one knee to your chest, keeping the other foot flat on the floor (or keeping the other leg straight, whichever feels better on your lower back).   3. Keep your lower back pressed to the floor. Hold for at least 15 to 30 seconds.   4. Relax, and lower the knee to the starting position.   5. Repeat with the other leg. Repeat 2 to 4 times with each leg.   6. To get  more stretch, put your other leg flat on the floor while pulling your knee to your chest.   Curl-ups    1. Lie on the floor on your back with your knees bent at a 90-degree angle. Your feet should be flat on the floor, about 12 inches from your buttocks.   2. Cross your arms over your chest.   3. Slowly tighten your belly muscles and raise your shoulder blades off the floor.   4. Keep your head in line with your body, and do not press your chin to your chest.   5. Hold this position for 1 or 2 seconds, then slowly lower yourself back down to the floor.   6. Repeat 8 to 12 times.   Pelvic tilt exercise    1. Lie on your back with your knees bent.   2. "Brace" your stomach. This means to tighten your muscles by pulling in and imagining your belly button moving toward your spine. You should feel like your back is pressing to the floor and your hips and pelvis are rocking back.     3. Hold for about 6 seconds while you breathe smoothly.   4. Repeat 8 to 12 times.   Heel dig bridging    1. Lie on your back with both knees bent and your ankles bent so that only your heels are digging into the floor. Your knees should be bent about 90 degrees.   2. Then push your heels into the floor, squeeze your buttocks, and lift your hips off the floor until your shoulders, hips, and knees are all in a straight line.   3. Hold for about 6 seconds as you continue to breathe normally, and then slowly lower your hips back down to the floor and rest for up to 10 seconds.   4. Do 8 to 12 repetitions.   Hamstring stretch in doorway    1. Lie on your back in a doorway, with one leg through the open door.   2. Slide your leg up the wall to straighten your knee. You should feel a gentle stretch down the back of your leg.   3. Hold the stretch for at least 15 to 30 seconds. Do not arch your back, point your toes, or bend either knee. Keep one heel touching the floor and the other heel touching the wall.   4. Repeat with your other leg.   5. Do 2  to 4 times for each leg.   Hip flexor stretch    1. Kneel on the floor with one knee bent and one leg behind you. Place your forward knee over your foot. Keep your other knee touching the floor.   2. Slowly push your hips forward until you feel a stretch in the upper thigh of your rear leg.   3. Hold the stretch for at least 15 to 30 seconds. Repeat with your other leg.   4. Do 2 to 4 times on each side.   Wall sit    1. Stand with your back 10 to 12 inches away from a wall.   2. Lean into the wall until your back is flat against it.   3. Slowly slide down until your knees are slightly bent, pressing your lower back into the wall.   4. Hold for about 6 seconds, then slide back up the wall.   5. Repeat 8 to 12 times.   Follow-up care is a key part of your treatment and safety. Be sure to make and go to all appointments, and call your doctor if you are having problems. It's also a good idea to know your test results and keep a list of the medicines you take.    Where can you learn more?    Go to http://www.[email or web address].net/[email or web address]   Enter Z938 in the search box to learn more about "Low Back Pain: Exercises."    ?? 2006-2012 [employer]. Care instructions adapted under license by [facility] (which disclaims liability or warranty for this information). This care instruction is for use with your licensed healthcare professional. If you have questions about a medical condition or this instruction, always ask your healthcare professional. Healthwise, Incorporated disclaims any warranty or liability for your use of this information.  Content Version: 9.5.76532; Last Revised: July 03, 2010

## 2011-09-10 NOTE — Progress Notes (Signed)
Subjective:   Melissa Leblanc is a 46 y.o. female with hypertension & pre-diabetes.    Hypertension ROS: taking medications as instructed, no medication side effects noted, no TIA's, no chest pain on exertion, no dyspnea on exertion, no swelling of ankles.     Other symptoms and concerns: some back pain lower and up to shoulder.  Feels stiff.  Worse with sitting any time.  Has been trying to stretch it out.  Also pain with laying down.  Rated 5/10.    No numbness, tingling, weakness, F, C, incontinence. All othert systems reviewed and neg.  Did have flu about 2 weeks ago.    She has improved diet and is walking for exercise.  Also working on stopping smoking.    Current Outpatient Prescriptions   Medication Sig Dispense Refill   ??? lisinopril-hydrochlorothiazide (PRINZIDE, ZESTORETIC) 20-25 mg per tablet Take 1 Tab by mouth daily.  30 Tab  3   ??? sertraline (ZOLOFT) 50 mg tablet Take 1 Tab by mouth daily.  90 Tab  5      Past Medical History   Diagnosis Date   ??? Hypertension      Family History   Problem Relation Age of Onset   ??? Cancer Mother    ??? Breast Cancer Mother    ??? Cancer Father    ??? Alcohol abuse Neg Hx    ??? Arthritis-rheumatoid Neg Hx    ??? Asthma Neg Hx    ??? Bleeding Prob Neg Hx    ??? Diabetes Neg Hx    ??? Elevated Lipids Neg Hx    ??? Headache Neg Hx    ??? Heart Disease Neg Hx    ??? Hypertension Neg Hx    ??? Lung Disease Neg Hx    ??? Migraines Neg Hx    ??? Psychiatric Disorder Neg Hx    ??? Stroke Neg Hx    ??? Mental Retardation Neg Hx      Lab Results   Component Value Date/Time    Cholesterol, total 207 05/07/2011  7:57 AM    HDL Cholesterol 53 05/07/2011  7:57 AM    LDL, calculated 130 05/07/2011  7:57 AM    VLDL, calculated 24 05/07/2011  7:57 AM    Triglyceride 121 05/07/2011  7:57 AM     Lab Results   Component Value Date/Time    Sodium 136 06/03/2011 11:10 AM    Potassium 4.1 06/03/2011 11:10 AM    Chloride 99 06/03/2011 11:10 AM    CO2 22 06/03/2011 11:10 AM    Glucose 66 06/03/2011 11:10 AM    BUN 8 06/03/2011 11:10  AM    Creatinine 0.78 06/03/2011 11:10 AM    BUN/Creatinine ratio 10 06/03/2011 11:10 AM    GFR est non-AA 93 06/03/2011 11:10 AM    Calcium 9.1 06/03/2011 11:10 AM    Bilirubin, total 0.2 05/07/2011  7:57 AM    ALT 12 05/07/2011  7:57 AM    AST 14 05/07/2011  7:57 AM    Alk. phosphatase 65 05/07/2011  7:57 AM    Protein, total 7.7 05/07/2011  7:57 AM    Albumin 4.2 05/07/2011  7:57 AM    A-G Ratio 1.2 05/07/2011  7:57 AM     Wt Readings from Last 3 Encounters:   09/10/11 169 lb (76.658 kg)   06/03/11 172 lb (78.019 kg)   05/20/11 172 lb (78.019 kg)       Objective:     BP 124/84  Pulse 80   Temp(Src) 97.7 ??F (36.5 ??C) (Oral)   Resp 16   Ht 5\' 5"  (1.651 m)   Wt 169 lb (76.658 kg)   BMI 28.12 kg/m2   SpO2 100%  CARDIAC:  RRR S1S2. No Murmur, No peripheral edema.  LUNGS: CTAB w/ normal effort.  GEN:  Appears stated age in NAD.  NEURO:  Sensation intact to light touch.  Reflexes +2/4 patellar and Achilles bilaterally.  M/S:  Examined standing and supine.  SLR negative.  Slump negative.  Standing flexion test positive right  Stork positive right  ASIS low right  Iliac crests high left Pubes level bilateral   Medial malleolus low right  Sacral base posterior right  ILA inferior right  Sphinx test Positive TTA at L4 on the left LE Strength +5/5 bilateral   EXT:  no clubbing/cyanosis.  no edema.  SKIN: Warm & dry w/o rash.      Assessment:    Encounter Diagnoses   Name Primary?   ??? HTN (hypertension) Yes   ??? Lumbago    ??? Lumbar region somatic dysfunction    ??? Pelvic somatic dysfunction    ??? Sacral region somatic dysfunction      Plan:   current treatment plan is effective, no change in therapy.  Orders Placed This Encounter   ??? PR OSTEOPATHIC MANIP,3-4 BODY REGN     RTC 3 months, sooner if needed.  Fastin labs 1 week prior.    Lumbar, Sacral, Pelvic SD treated with ME.  Correction of previous malalignments verified after Tx.    Pt tolerated well.  Notes improvement of Sx and pain is now rated 1/10.    HEP/stretches daily.  Discussed stetching/strengthening/posture.

## 2011-11-08 ENCOUNTER — Other Ambulatory Visit

## 2011-11-08 MED ORDER — FLUCONAZOLE 150 MG TAB
150 mg | ORAL_TABLET | Freq: Every day | ORAL | Status: AC
Start: 2011-11-08 — End: 2011-11-09

## 2011-11-08 NOTE — Progress Notes (Signed)
46 yo g5p5 presents for repeat pap.  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  Here for f/u    Past Medical History   Diagnosis Date   ??? Hypertension      Past Surgical History   Procedure Date   ??? Hx gyn      BTL   ??? Hx hysterectomy 2004     OB History     Grav Para Term Preterm Abortions TAB SAB Ect Mult Living    5 5 5  0 0 0 0 0 0 5        History     Social History   ??? Marital Status: SINGLE     Spouse Name: N/A     Number of Children: N/A   ??? Years of Education: N/A     Occupational History   ??? Not on file.     Social History Main Topics   ??? Smoking status: Current Everyday Smoker   ??? Smokeless tobacco: Not on file   ??? Alcohol Use: Yes   ??? Drug Use: No   ??? Sexually Active: No     Other Topics Concern   ??? Not on file     Social History Narrative   ??? No narrative on file     Pe: Blood pressure 130/93, pulse 91, weight 170 lb (77.111 kg).  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  Repeat pap  2.  colpo if abnormal; if normal - yearly screening  3.  mammo due 04/2012  4.  Diflucan for yeast infection    jc

## 2011-11-08 NOTE — Patient Instructions (Signed)
MyChart Activation    Thank you for requesting access to MyChart. Please follow the instructions below to securely access and download your online medical record. MyChart allows you to send messages to your doctor, view your test results, renew your prescriptions, schedule appointments, and more.    How Do I Sign Up?    1. In your internet browser, go to [email or web address]  2. Click on the First Time User? Click Here link in the Sign In box. You will be redirect to the New Member Sign Up page.  3. Enter your MyChart Access Code exactly as it appears below. You will not need to use this code after you???ve completed the sign-up process. If you do not sign up before the expiration date, you must request a new code.    MyChart Access Code: [email or web address]  Expires: 02/06/2012 10:17 AM (This is the date your MyChart access code will expire)    4. Enter the last four digits of your Social Security Number (xxxx) and Date of Birth (mm/dd/yyyy) as indicated and click Submit. You will be taken to the next sign-up page.  5. Create a MyChart ID. This will be your MyChart login ID and cannot be changed, so think of one that is secure and easy to remember.  6. Create a MyChart password. You can change your password at any time.  7. Enter your Password Reset Question and Answer. This can be used at a later time if you forget your password.   8. Enter your e-mail address. You will receive e-mail notification when new information is available in MyChart.  9. Click Sign Up. You can now view and download portions of your medical record.  10. Click the Download Summary menu link to download a portable copy of your medical information.    Additional Information    If you have questions, please visit the Frequently Asked Questions section of the MyChart website at [email or web address] Remember, MyChart is NOT to be used for urgent needs. For medical emergencies, dial 911.

## 2011-11-12 NOTE — Progress Notes (Signed)
Quick Note:    Pap is normal. pls let pt know thanks  ______

## 2011-11-13 NOTE — Telephone Encounter (Signed)
Spoke with patient informed her of normal pap. Pt verbalized understanding.

## 2011-11-13 NOTE — Telephone Encounter (Signed)
Message copied by Gardiner Rhyme on Wed Nov 13, 2011  9:29 AM  ------       Message from: Sheran Luz B       Created: Tue Nov 12, 2011  9:31 AM         Pap is normal.  pls let pt know thanks
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
## 2011-11-26 NOTE — Telephone Encounter (Signed)
Called patient on [phone] (home)  not her number or voice mail.  Then tried phone number [phone] (this is a number on an order) left a message for her to call me back (no detail to why non-secure line).      We have received two faxes for an order on this patient that we have never ordered before need to know if it goes to her OB/GYN or who ordered these products before.  The forms are on my desk.

## 2011-11-30 LAB — ETHYL ALCOHOL: ALCOHOL(ETHYL),SERUM: 222 MG/DL — ABNORMAL HIGH (ref 0–3)

## 2011-11-30 MED ORDER — SODIUM CHLORIDE 0.9% BOLUS IV
0.9 % | Freq: Once | INTRAVENOUS | Status: DC
Start: 2011-11-30 — End: 2011-11-30

## 2011-11-30 MED ORDER — NAPROXEN 500 MG TAB
500 mg | ORAL_TABLET | Freq: Two times a day (BID) | ORAL | Status: DC
Start: 2011-11-30 — End: 2013-09-29

## 2011-11-30 MED FILL — SODIUM CHLORIDE 0.9 % IV: INTRAVENOUS | Qty: 1000

## 2011-11-30 NOTE — ED Notes (Signed)
Patient left AMA.
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
## 2011-11-30 NOTE — ED Notes (Signed)
Patient attempting to walk out the front door with IV in hand and fluids running. Patient was brought back to room where she refused to let nurse take IV out of her head, instead she took it out herself while using profanity to the nurse. Security was asked to escort patient out of hospital. Housekeeping was called to clean blood up off floor.

## 2011-12-03 LAB — METABOLIC PANEL, COMPREHENSIVE
A-G Ratio: 1.1 (ref 1.1–2.5)
ALT (SGPT): 14 IU/L (ref 0–32)
AST (SGOT): 19 IU/L (ref 0–40)
Albumin: 3.1 g/dL — ABNORMAL LOW (ref 3.5–5.5)
Alk. phosphatase: 56 IU/L (ref 25–150)
BUN/Creatinine ratio: 8 — ABNORMAL LOW (ref 9–23)
BUN: 5 mg/dL — ABNORMAL LOW (ref 6–24)
Bilirubin, total: 0.4 mg/dL (ref 0.0–1.2)
CO2: 23 mmol/L (ref 20–32)
Calcium: 8.3 mg/dL — ABNORMAL LOW (ref 8.7–10.2)
Chloride: 104 mmol/L (ref 97–108)
Creatinine: 0.66 mg/dL (ref 0.57–1.00)
GFR est non-AA: 108 mL/min/{1.73_m2} (ref >59)
GLOBULIN, TOTAL: 2.7 g/dL (ref 1.5–4.5)
Glucose: 96 mg/dL (ref 65–99)
Potassium: 4.3 mmol/L (ref 3.5–5.2)
Protein, total: 5.8 g/dL — ABNORMAL LOW (ref 6.0–8.5)
Sodium: 136 mmol/L (ref 134–144)
eGFR If African American: 124 mL/min/{1.73_m2} (ref >59)

## 2011-12-03 LAB — LIPID PANEL
Cholesterol, total: 154 mg/dL (ref 100–199)
HDL Cholesterol: 48 mg/dL (ref >39)
LDL, calculated: 88 mg/dL (ref 0–99)
Triglyceride: 89 mg/dL (ref 0–149)
VLDL, calculated: 18 mg/dL (ref 5–40)

## 2011-12-13 LAB — AMB POC URINALYSIS DIP STICK AUTO W/O MICRO
Bilirubin (UA POC): NEGATIVE
Blood (UA POC): NEGATIVE
Glucose (UA POC): NEGATIVE
Ketones (UA POC): NEGATIVE
Leukocyte esterase (UA POC): NEGATIVE
Nitrites (UA POC): NEGATIVE
Protein (UA POC): NEGATIVE mg/dL
Specific gravity (UA POC): 1.01 (ref 1.001–1.035)
Urobilinogen (UA POC): 0.2 (ref 0.2–1)
pH (UA POC): 5.5 (ref 4.6–8.0)

## 2011-12-13 MED ORDER — SOLIFENACIN 5 MG TAB
5 mg | ORAL_TABLET | Freq: Every day | ORAL | Status: DC
Start: 2011-12-13 — End: 2012-02-07
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
## 2011-12-13 NOTE — Patient Instructions (Signed)
Kegel Exercises: After Your Visit  Your Care Instructions  Kegel exercises strengthen muscles around the bladder that control the flow of urine. They can help prevent urine leakage and keep the pelvic organs in place. Kegel exercises are sometime called "pelvic floor" exercises.  A woman who just had a baby might want to try Kegel exercises to strengthen pelvic muscles weakened by pregnancy and delivery. A man or woman may use Kegel exercises to treat urine leakage.  Kegel exercises are done by tightening the muscles used during urination. The exercises are repeated during each session. You usually need to do these exercises for several weeks to get better.  Follow-up care is a key part of your treatment and safety. Be sure to make and go to all appointments, and call your doctor if you are having problems. It???s also a good idea to know your test results and keep a list of the medicines you take.  How can you care for yourself at home?  ?? Do pelvic floor (Kegel) exercises, which tighten and strengthen pelvic muscles. To do Kegel exercises:   ?? Find the muscles you need to strengthen. To do this, tighten the muscles that stop your urine while you are going to the bathroom. These are the same muscles you squeeze during Kegel exercises.   ?? Squeeze the muscles as hard as you can. Your belly and rear end (buttocks) should not move.   ?? Hold the squeeze for 3 seconds. Then relax for 3 seconds.   ?? Repeat this as many times as you can. Work up to 15 times a session.   ?? Try to do three sessions every day.   ?? You can check to see if you are using the right muscles by placing a finger in your vagina and squeezing around it. You are doing them right when you feel pressure around your finger. Your doctor may also suggest that you put special weights in your vagina while doing the exercises.   ?? Do not smoke. It can irritate the bladder. If you need help quitting, talk to your doctor about stop-smoking programs and  medicines. These can increase your chances of quitting for good.     Where can you learn more?    Go to http://www.healthwise.net/[facility]   Enter Q681 in the search box to learn more about "Kegel Exercises: After Your Visit."    ?? 2006-2013 Healthwise, Incorporated. Care instructions adapted under license by [employer] (which disclaims liability or warranty for this information). This care instruction is for use with your licensed healthcare professional. If you have questions about a medical condition or this instruction, always ask your healthcare professional. Healthwise, Incorporated disclaims any warranty or liability for your use of this information.  Content Version: 9.6.101520; Last Revised: July 06, 2010

## 2011-12-27 DIAGNOSIS — N3941 Urge incontinence: Secondary | ICD-10-CM | POA: Insufficient documentation
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
## 2012-02-07 MED ORDER — LISINOPRIL-HYDROCHLOROTHIAZIDE 20 MG-25 MG TAB
20-25 mg | ORAL_TABLET | Freq: Every day | ORAL | Status: DC
Start: 2012-02-07 — End: 2012-06-24

## 2012-02-07 MED ORDER — FLUTICASONE 50 MCG/ACTUATION NASAL SPRAY, SUSP
50 mcg/actuation | Freq: Every day | NASAL | Status: DC
Start: 2012-02-07 — End: 2013-09-29

## 2012-02-07 MED ORDER — SOLIFENACIN 5 MG TAB
5 mg | ORAL_TABLET | Freq: Every day | ORAL | Status: DC
Start: 2012-02-07 — End: 2013-09-29

## 2012-02-07 NOTE — Progress Notes (Signed)
HISTORY OF PRESENT ILLNESS    Melissa Leblanc is a 46 y.o. year old female here today to follow up for:  Urine Incontinence, HTN, itchy throat    Has been taking 1/2 tab of Vesicare for over a month as too much cramping with a whole tab.  Stil going frequently but able to hold her urine better now.  Still has nocturia but depending on how many liquids she drinks.  Does seem to drink more when she gets some scratching in her throat and that will make her drink more.  Denies nasal congestion but does seem to need to blow her nose in the morning and will have rhinorrhea.    Would also like refill of zestoretic for stable well controlled HTN.    Current Outpatient Prescriptions   Medication Sig Dispense Refill   ??? solifenacin (VESICARE) 5 mg tablet Take 1 Tab by mouth daily.  30 Tab  1   ??? naproxen (NAPROSYN) 500 mg tablet Take 1 Tab by mouth two (2) times daily (with meals).  20 Tab  0   ??? lisinopril-hydrochlorothiazide (PRINZIDE, ZESTORETIC) 20-25 mg per tablet Take 1 Tab by mouth daily.  90 Tab  1   ??? sertraline (ZOLOFT) 50 mg tablet Take 1 Tab by mouth daily.  90 Tab  5     Past Medical History   Diagnosis Date   ??? Hypertension    ??? Prediabetes    ??? VAIN I (vaginal intraepithelial neoplasia grade I)        ROS:  No F, C    Objective:  BP 118/78   Pulse 92   Temp(Src) 98 ??F (36.7 ??C) (Oral)   Resp 16   Ht 5\' 5"  (1.651 m)   Wt 170 lb (77.111 kg)   BMI 28.29 kg/m2   SpO2 98%  GEN:  Appears stated age in NAD.  HEENT: Conjunctiva/lids normal.  External ears and nose without lesions/trauma.  Hearing Intact.  Tongue midline.  + boggy turbinates b/l and cobblestoning.  NECK: Trachea midline.  Supple.  Full ROM  CARDIAC:  regular rate and rhythm. no Murmur, no peripheral edema.  LUNGS: lungs clear to auscultation, no accessory muscle use.  MS: no clubbing/cyanosis.  SKIN: Warm/dry without rash.  ABD: NTND, No HSM    Assessment/Plan:   1. Urge incontinence  solifenacin (VESICARE) 5 mg tablet   2. HTN (hypertension)   lisinopril-hydrochlorothiazide (PRINZIDE, ZESTORETIC) 20-25 mg per tablet   3. Allergic rhinitis  fluticasone (FLONASE) 50 mcg/actuation nasal spray     Discussed flonase PRN and 1/2 tab vesicare.    RCT 3-6 months pr PRN.

## 2012-02-07 NOTE — Patient Instructions (Signed)
Allergies: After Your Visit  Your Care Instructions  Allergies occur when your body's defense system (immune system) overreacts to certain substances. The immune system treats a harmless substance as if it were a harmful germ or virus. Many things can cause this overreaction, including pollens, medicine, food, dust, animal dander, and mold.  Allergies can be mild or severe. Mild allergies can be managed with home treatment. But medicine may be needed to prevent problems.  Managing your allergies is an important part of staying healthy. Your doctor may suggest that you have allergy testing to help find out what is causing your allergies. When you know what things trigger your symptoms, you can avoid them. This can prevent allergy symptoms and other health problems.  For severe allergies that cause reactions that affect your whole body (anaphylactic reactions), your doctor may prescribe a shot of epinephrine (such as EpiPen) to carry with you in case you have a severe reaction. Learn how to give yourself the shot and keep it with you at all times. Make sure it is not expired.  Follow-up care is a key part of your treatment and safety. Be sure to make and go to all appointments, and call your doctor if you are having problems. It's also a good idea to know your test results and keep a list of the medicines you take.  How can you care for yourself at home?  ?? If you have been told by your doctor that dust or dust mites are causing your allergy, decrease the dust around your bed:   ?? Wash sheets, pillowcases, and other bedding in hot water every week.   ?? Use dust-proof covers for pillows, duvets, and mattresses. Avoid plastic covers because they tear easily and do not "breathe." Wash as instructed on the label.   ?? Do not use any blankets and pillows that you do not need.   ?? Use blankets that you can wash in your washing machine.   ?? Consider removing drapes and carpets, which attract and hold dust, from your  bedroom.   ?? If you are allergic to house dust and mites, do not use home humidifiers. Your doctor can suggest ways you can control dust and mites.   ?? Look for signs of cockroaches. Cockroaches cause allergic reactions. Use cockroach baits to get rid of them. Then, clean your home well. Cockroaches like areas where grocery bags, newspapers, empty bottles, or cardboard boxes are stored. Do not keep these inside your home, and keep trash and food containers sealed. Seal off any spots where cockroaches might enter your home.   ?? If you are allergic to mold, get rid of furniture, rugs, and drapes that smell musty. Check for mold in the bathroom.   ?? If you are allergic to outdoor pollen or mold spores, use air-conditioning. Change or clean all filters every month. Keep windows closed.   ?? If you are allergic to pollen, stay inside when pollen counts are high. Use a vacuum cleaner with a HEPA filter or a double-thickness filter at least two times each week.   ?? Stay inside when air pollution is bad. Avoid paint fumes, perfumes, and other strong odors.   ?? Avoid conditions that make your allergies worse. Stay away from smoke. Do not smoke or let anyone else smoke in your house. Do not use fireplaces or wood-burning stoves.   ?? If you are allergic to your pets, change the air filter in your furnace every month. Use high-efficiency filters.   ??   If you are allergic to pet dander, keep pets outside or out of your bedroom. Old carpet and cloth furniture can hold a lot of animal dander. You may need to replace them.   When should you call for help?  Call 911 anytime you think you may need emergency care. For example, call if:  ?? You have severe trouble breathing.   ?? You passed out (lost consciousness).   Note: After calling 911, use the allergy kit prescribed by your doctor if you have one.  Watch closely for changes in your health, and be sure to contact your doctor if:  ?? You need help controlling your allergies.   ?? You  have questions about allergy testing.   ?? You do not get better as expected.     Where can you learn more?    Go to http://www.healthwise.net/[facility]   Enter W171 in the search box to learn more about "Allergies: After Your Visit."    ?? 2006-2013 Healthwise, Incorporated. Care instructions adapted under license by [employer] (which disclaims liability or warranty for this information). This care instruction is for use with your licensed healthcare professional. If you have questions about a medical condition or this instruction, always ask your healthcare professional. Healthwise, Incorporated disclaims any warranty or liability for your use of this information.  Content Version: 9.7.130178; Last Revised: October 26, 2010

## 2012-03-25 NOTE — Telephone Encounter (Signed)
Called and left a non-detailed message asking patient to call back.  We need to reschedule her 05/11/12 appointment provider will be out of office.

## 2012-04-17 ENCOUNTER — Encounter

## 2012-05-05 ENCOUNTER — Encounter

## 2012-05-14 NOTE — Progress Notes (Signed)
Spoke with patient she forgot about her appointment on 05/13/2012 we did reschedule her appointment for 05/25/2012.

## 2012-05-26 MED ORDER — PAROXETINE 20 MG TAB
20 mg | ORAL_TABLET | Freq: Every day | ORAL | Status: DC
Start: 2012-05-26 — End: 2012-06-24
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
## 2012-05-26 NOTE — Patient Instructions (Signed)
Depression Treatment: After Your Visit  Your Care Instructions  Depression is a condition that affects the way you feel, think, and act. It causes symptoms such as low energy, loss of interest in daily activities, and sadness or grouchiness that goes on for a long time. Depression is very common and affects men and women of all ages.  Depression is a medical illness caused by changes in the natural chemicals in your brain. It is not a character flaw, and it does not mean that you are a bad or weak person. It does not mean that you are going crazy.  It is important to know that depression can be treated. Medicines, counseling, and self-care can all help. Many people do not get help because they are embarrassed or think that they will get over the depression on their own. But some people do not get better without treatment.  Follow-up care is a key part of your treatment and safety. Be sure to make and go to all appointments, and call your doctor if you are having problems. It???s also a good idea to know your test results and keep a list of the medicines you take.  How can you care for yourself at home?  Learn about antidepressant medicines  Antidepressant medicines can improve or end the symptoms of depression. You may need to take the medicine for at least 6 months, and often longer. Keep taking your medicine even if you feel better. If you stop taking it too soon, your symptoms may come back or get worse.  You may start to feel better within 1 to 3 weeks of taking antidepressant medicine. But it can take as many as 6 to 8 weeks to see more improvement. Talk to your doctor if you have problems with your medicine or if you do not notice any improvement after 3 weeks.  Antidepressants can make you feel tired, dizzy, or nervous. Some people have dry mouth, constipation, headaches, sexual problems, an upset stomach, or diarrhea. Many of these side effects are mild and go away on their own after you take the medicine  for a few weeks. Some may last longer. Talk to your doctor if side effects bother you too much. You might be able to try a different medicine. If you are pregnant or breast-feeding, talk to your doctor about what medicines you can take.  Learn about counseling  In many cases, counseling can work as well as medicines to treat mild to moderate depression. Counseling is done by licensed mental health providers, such as psychologists, social workers, and some types of nurses. It can be done in one-on-one sessions or in a group setting. Many people find group sessions helpful.  Cognitive-behavioral therapy is a type of counseling. In this treatment therapy, you learn how to see and change unhelpful thinking styles that may be adding to your depression. Counseling and medicines often work well when used together.  To manage depression  ?? Be physically active. Getting 30 minutes of exercise each day is good for your body and your mind. Begin slowly if it is hard for you to get started. If you already exercise, keep it up.   ?? Plan something pleasant for yourself every day. Include activities that you have enjoyed in the past.   ?? Get enough sleep. Talk to your doctor if you have problems sleeping.   ?? Eat a balanced diet. If you do not feel hungry, eat small snacks rather than large meals.   ?? Do   not drink alcohol, use illegal drugs, or take medicines that your doctor has not prescribed for you. They may interfere with your treatment.   ?? Spend time with family and friends. It may help to speak openly about your depression with people you trust.   ?? Take your medicines exactly as prescribed. Call your doctor if you think you are having a problem with your medicine.   ?? Do not make major life decisions while you are depressed. Depression may change the way you think. You will be able to make better decisions after you feel better.   ?? Think positively. Challenge negative thoughts with statements such as "I am hopeful";  "Things will get better"; and "I can ask for the help I need." Write down these statements and read them often, even if you don't believe them yet.   ?? Be patient with yourself. It took time for your depression to develop, and it will take time for your symptoms to improve. Do not take on too much or be too hard on yourself.   ?? Learn all you can about depression from written and online materials.   ?? Check out behavioral health classes to learn more about dealing with depression.   When should you call for help?  Call 911 anytime you think you may need emergency care. For example, call if:  ?? You feel you cannot stop from hurting yourself or someone else.   Call your doctor now or seek immediate medical care if:  ?? You hear voices.   ?? You feel much more depressed.   Watch closely for changes in your health, and be sure to contact your doctor if:  ?? You are having problems with your depression medicine.   ?? You are not getting better as expected.     Where can you learn more?    Go to http://www.healthwise.net/[facility]   Enter G693 in the search box to learn more about "Depression Treatment: After Your Visit."    ?? 2006-2013 Health[employer]. Care instructions adapted under license by [facility] (which disclaims liability or warranty for this information). This care instruction is for use with your licensed healthcare professional. If you have questions about a medical condition or this instruction, always ask your healthcare professional. Healthwise, Incorporated disclaims any warranty or liability for your use of this information.  Content Version: 9.7.130178; Last Revised: November 03, 2009

## 2012-06-24 MED ORDER — PAROXETINE 20 MG TAB
20 mg | ORAL_TABLET | Freq: Every day | ORAL | Status: DC
Start: 2012-06-24 — End: 2013-09-29

## 2012-06-24 MED ORDER — LISINOPRIL-HYDROCHLOROTHIAZIDE 20 MG-25 MG TAB
20-25 mg | ORAL_TABLET | Freq: Every day | ORAL | Status: DC
Start: 2012-06-24 — End: 2013-02-28
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
## 2012-06-25 LAB — CBC WITH AUTOMATED DIFF
ABS. BASOPHILS: 0 10*3/uL (ref 0.0–0.2)
ABS. EOSINOPHILS: 0 10*3/uL (ref 0.0–0.4)
ABS. IMM. GRANS.: 0 10*3/uL (ref 0.0–0.1)
ABS. MONOCYTES: 0.5 10*3/uL (ref 0.1–0.9)
ABS. NEUTROPHILS: 2.9 10*3/uL (ref 1.4–7.0)
Abs Lymphocytes: 1 10*3/uL (ref 0.7–3.1)
BASOPHILS: 0 % (ref 0–3)
EOSINOPHILS: 0 % (ref 0–5)
HCT: 40.8 % (ref 34.0–46.6)
HGB: 13.3 g/dL (ref 11.1–15.9)
IMMATURE GRANULOCYTES: 0 % (ref 0–2)
Lymphocytes: 23 % (ref 14–46)
MCH: 28.1 pg (ref 26.6–33.0)
MCHC: 32.6 g/dL (ref 31.5–35.7)
MCV: 86 fL (ref 79–97)
MONOCYTES: 11 % (ref 4–12)
NEUTROPHILS: 66 % (ref 40–74)
PLATELET: 313 10*3/uL (ref 155–379)
RBC: 4.74 x10E6/uL (ref 3.77–5.28)
RDW: 16.4 % — ABNORMAL HIGH (ref 12.3–15.4)
WBC: 4.4 10*3/uL (ref 3.4–10.8)

## 2012-06-25 LAB — METABOLIC PANEL, COMPREHENSIVE
A-G Ratio: 1.1 (ref 1.1–2.5)
ALT (SGPT): 13 IU/L (ref 0–32)
AST (SGOT): 19 IU/L (ref 0–40)
Albumin: 3.9 g/dL (ref 3.5–5.5)
Alk. phosphatase: 80 IU/L (ref 39–117)
BUN/Creatinine ratio: 7 — ABNORMAL LOW (ref 9–23)
BUN: 5 mg/dL — ABNORMAL LOW (ref 6–24)
Bilirubin, total: 0.2 mg/dL (ref 0.0–1.2)
CO2: 25 mmol/L (ref 19–28)
Calcium: 9.8 mg/dL (ref 8.7–10.2)
Chloride: 97 mmol/L (ref 97–108)
Creatinine: 0.72 mg/dL (ref 0.57–1.00)
GFR est AA: 117 mL/min/{1.73_m2} (ref >59)
GFR est non-AA: 101 mL/min/{1.73_m2} (ref >59)
GLOBULIN, TOTAL: 3.6 g/dL (ref 1.5–4.5)
Glucose: 87 mg/dL (ref 65–99)
Potassium: 4.4 mmol/L (ref 3.5–5.2)
Protein, total: 7.5 g/dL (ref 6.0–8.5)
Sodium: 136 mmol/L (ref 134–144)

## 2012-06-29 LAB — HEMATOPATH CONSULTATION, SMEAR
Platelets: NORMAL
RBC: NORMAL
WBC: NORMAL

## 2012-06-30 ENCOUNTER — Encounter

## 2012-07-03 ENCOUNTER — Encounter

## 2012-07-03 NOTE — Telephone Encounter (Signed)
LM that she needs CT to eval neck mass but I will call her later today to discuss person to person.

## 2012-07-09 ENCOUNTER — Encounter

## 2012-07-09 MED ORDER — IOVERSOL 320 MG/ML IV SOLN
320 mg iodine/mL | Freq: Once | INTRAVENOUS | Status: AC
Start: 2012-07-09 — End: 2012-07-09
  Administered 2012-07-09: 16:00:00 via INTRAVENOUS

## 2012-07-09 MED FILL — OPTIRAY 320 MG IODINE/ML INTRAVENOUS SOLUTION: 320 mg iodine/mL | INTRAVENOUS | Qty: 100

## 2012-07-10 NOTE — Telephone Encounter (Signed)
Pt called office would like results of her ct scan . She can be reached [phone]. Thanks

## 2012-07-13 ENCOUNTER — Encounter

## 2012-07-14 NOTE — Telephone Encounter (Signed)
Pt called office states her phone is currently not in service . She can be reached at [phone]. FYI:  Thanks.

## 2012-07-16 NOTE — Telephone Encounter (Signed)
Called and LM about CT.

## 2012-07-16 NOTE — Telephone Encounter (Signed)
Left message I will call her back.  Goes straight to voicemail.

## 2012-07-23 ENCOUNTER — Encounter

## 2012-07-23 MED ORDER — LORAZEPAM 0.5 MG TAB
0.5 mg | ORAL_TABLET | Freq: Three times a day (TID) | ORAL | Status: DC | PRN
Start: 2012-07-23 — End: 2013-09-29

## 2012-07-23 NOTE — Telephone Encounter (Signed)
Pt called office would like return call to [phone]. Confirmed alternate number again is [phone]. Thanks

## 2012-07-23 NOTE — Telephone Encounter (Signed)
Discussed we need to do biopsy as CT and ultrasound very suspicious for lymphoma.  She wanted something to help calm her down so I gave her #4 0.5mg  ativan and advised her to take them with her to the CT guided biopsy and ask if they will give her something there or should she take the ativan she brought.  She will return to office after biopsy complete for results.

## 2012-07-27 ENCOUNTER — Inpatient Hospital Stay: Payer: MEDICARE

## 2012-07-27 LAB — METABOLIC PANEL, BASIC
Anion gap: 6 mmol/L (ref 3.0–18)
BUN/Creatinine ratio: 11 — ABNORMAL LOW (ref 12–20)
BUN: 7 MG/DL (ref 7.0–18)
CO2: 26 MMOL/L (ref 21–32)
Calcium: 9.3 MG/DL (ref 8.5–10.1)
Chloride: 107 MMOL/L (ref 100–108)
Creatinine: 0.64 MG/DL (ref 0.6–1.3)
GFR est AA: >60 mL/min/{1.73_m2} (ref >60)
GFR est non-AA: >60 mL/min/{1.73_m2} (ref >60)
Glucose: 83 MG/DL (ref 74–99)
Potassium: 4.7 MMOL/L (ref 3.5–5.5)
Sodium: 139 MMOL/L (ref 136–145)

## 2012-07-27 LAB — CBC W/O DIFF
HCT: 37 % (ref 35.0–45.0)
HGB: 12.4 g/dL (ref 12.0–16.0)
MCH: 26.9 PG (ref 24.0–34.0)
MCHC: 33.5 g/dL (ref 31.0–37.0)
MCV: 80.3 FL (ref 74.0–97.0)
MPV: 10 FL (ref 9.2–11.8)
PLATELET: 308 10*3/uL (ref 135–420)
RBC: 4.61 M/uL (ref 4.20–5.30)
RDW: 15.8 % — ABNORMAL HIGH (ref 11.6–14.5)
WBC: 4.4 10*3/uL — ABNORMAL LOW (ref 4.6–13.2)

## 2012-07-27 LAB — PTT: aPTT: 31.9 s (ref 24.6–37.7)

## 2012-07-27 LAB — PROTHROMBIN TIME + INR
INR: 0.9 (ref 0.8–1.2)
Prothrombin time: 11.8 s (ref 11.5–15.2)

## 2012-07-27 MED ORDER — MIDAZOLAM 1 MG/ML IJ SOLN
1 mg/mL | INTRAMUSCULAR | Status: DC | PRN
Start: 2012-07-27 — End: 2012-07-27
  Administered 2012-07-27 (×2): via INTRAVENOUS

## 2012-07-27 MED ORDER — HYDROCODONE-ACETAMINOPHEN 5 MG-325 MG TAB
5-325 mg | ORAL | Status: DC | PRN
Start: 2012-07-27 — End: 2012-07-27

## 2012-07-27 MED ORDER — SODIUM CHLORIDE 0.9 % IJ SYRG
INTRAMUSCULAR | Status: DC | PRN
Start: 2012-07-27 — End: 2012-08-27

## 2012-07-27 MED ORDER — NALOXONE 0.4 MG/ML INJECTION
0.4 mg/mL | INTRAMUSCULAR | Status: DC | PRN
Start: 2012-07-27 — End: 2012-07-27

## 2012-07-27 MED ORDER — MIDAZOLAM 1 MG/ML IJ SOLN
1 mg/mL | INTRAMUSCULAR | Status: DC
Start: 2012-07-27 — End: 2012-07-27

## 2012-07-27 MED ORDER — FENTANYL CITRATE (PF) 50 MCG/ML IJ SOLN
50 mcg/mL | INTRAMUSCULAR | Status: DC | PRN
Start: 2012-07-27 — End: 2012-07-27
  Administered 2012-07-27 (×2): via INTRAVENOUS

## 2012-07-27 MED ORDER — SODIUM CHLORIDE 0.9 % IV
INTRAVENOUS | Status: DC
Start: 2012-07-27 — End: 2012-08-27

## 2012-07-27 MED ORDER — FENTANYL CITRATE (PF) 50 MCG/ML IJ SOLN
50 mcg/mL | INTRAMUSCULAR | Status: DC
Start: 2012-07-27 — End: 2012-07-27

## 2012-07-27 MED ORDER — SODIUM CHLORIDE 0.9 % IV
INTRAVENOUS | Status: DC
Start: 2012-07-27 — End: 2012-07-27
  Administered 2012-07-27: 17:00:00 via INTRAVENOUS

## 2012-07-27 MED ORDER — SODIUM CHLORIDE 0.9 % IJ SYRG
Freq: Three times a day (TID) | INTRAMUSCULAR | Status: DC
Start: 2012-07-27 — End: 2012-08-27

## 2012-07-27 MED ORDER — FLUMAZENIL 0.1 MG/ML IV SOLN
0.1 mg/mL | INTRAVENOUS | Status: DC | PRN
Start: 2012-07-27 — End: 2012-07-27

## 2012-07-27 MED FILL — MIDAZOLAM 1 MG/ML IJ SOLN: 1 mg/mL | INTRAMUSCULAR | Qty: 2

## 2012-07-27 MED FILL — SODIUM CHLORIDE 0.9 % IV: INTRAVENOUS | Qty: 1000

## 2012-07-27 MED FILL — FENTANYL CITRATE (PF) 50 MCG/ML IJ SOLN: 50 mcg/mL | INTRAMUSCULAR | Qty: 2

## 2012-07-27 MED FILL — BD POSIFLUSH NORMAL SALINE 0.9 % INJECTION SYRINGE: INTRAMUSCULAR | Qty: 10

## 2012-07-27 NOTE — Other (Signed)
Received report and effective handoff at the bedside  from [employer]. Pt in stable condition.

## 2012-07-27 NOTE — Progress Notes (Signed)
Dr. Marney Doctor called for follow-up of previous call. MD to be in route to evaluate patient

## 2012-07-27 NOTE — Progress Notes (Signed)
Dr. Marney Doctor and Steward Drone, RN at bedside. Orders to observe patient for another hour.

## 2012-07-27 NOTE — Progress Notes (Signed)
Increased swelling noted around biopsy site/ bandage. No bleeding noted. Dr. Larry Sierras called to make aware, he will call back to check on patient. Patient in no acute distress.

## 2012-07-27 NOTE — Other (Addendum)
Seen again by Dr. Algis Downs. Melissa Leblanc. Swelling on neck had been on the left side of neck prior to biopsy procedure per Dr. Larry Leblanc.  Discharged home via wheelchair assisted by friend Kenton Kingfisher to the car in stable condition. Vital signs stable.

## 2012-07-27 NOTE — Progress Notes (Signed)
Swelling to left neck remains unchanged. No obvious bleeding, area soft to touch and tender with palpation at biopsy site when evaluated by Dr. Larry Sierras. Bandage is dry and intact. Patient in no distress.

## 2012-07-27 NOTE — Other (Signed)
TRANSFER - OUT REPORT:    Verbal report given to [facility] RN on Melissa Leblanc  being transferred to  Phase 2 for routine post - op       Report consisted of patient???s Situation, Background, Assessment and   Recommendations(SBAR).     Information from the following report(s) SBAR, Kardex and MAR was reviewed with the receiving nurse.    Opportunity for questions and clarification was provided.

## 2012-07-27 NOTE — H&P (Signed)
OUTPATIENT HISTORY AND PHYSICAL      Today 07/27/2012     Indication/Symptoms:   Melissa Leblanc is a 46 y.o. female here with left supraclavicular lymphadenopathy here for US guided LN Bx    Current Meds:    Prior to Admission medications    Medication Sig Start Date End Date Taking? Authorizing Provider   LORazepam (ATIVAN) 0.5 mg tablet Take 1-2 Tabs by mouth every eight (8) hours as needed for Anxiety. 1/2 hour before procedure. 07/23/12  Yes Steffanie Dunn, DO   PARoxetine (PAXIL) 20 mg tablet Take 1 Tab by mouth daily. 06/24/12  Yes Steffanie Dunn, DO   lisinopril-hydrochlorothiazide (PRINZIDE, ZESTORETIC) 20-25 mg per tablet Take 1 Tab by mouth daily. 06/24/12  Yes Steffanie Dunn, DO   solifenacin (VESICARE) 5 mg tablet Take 1 Tab by mouth daily. 02/07/12  Yes Steffanie Dunn, DO   fluticasone (FLONASE) 50 mcg/actuation nasal spray 2 Sprays by Both Nostrils route daily. 02/07/12  Yes Steffanie Dunn, DO   naproxen (NAPROSYN) 500 mg tablet Take 1 Tab by mouth two (2) times daily (with meals). 11/30/11  Yes Rene Moncion, DO       Allergies:    No Known Allergies    Comorbid Conditions:    Past Medical History   Diagnosis Date   ??? Hypertension    ??? Prediabetes    ??? VAIN I (vaginal intraepithelial neoplasia grade I)           Past Surgical History   Procedure Laterality Date   ??? Hx gyn       BTL   ??? Hx hysterectomy  2004     Data:    BP 112/72   Pulse 76   Temp(Src) 98.7 ??F (37.1 ??C)   Resp 18   Ht 5\' 5"  (1.651 m)   Wt 161 lb 1.6 oz   BMI 26.81 kg/m2   SpO2 97%:  Recent Labs      07/27/12   1145   PLT  308     Recent Labs      07/27/12   1145   INR  0.9   APTT  31.9       The H & P and/or progress notes and any available imaging were reviewed.  The risks, indications and possible alternatives to the procedure, including doing nothing, were discussed and informed consent was obtained.    Physical Exam:      Mental status:   Alert and oriented.   Examination specific to the procedure proposed to be performed and any co  morbid conditions:   Mallampati classification 2 ,  ASA2   Heart:   RRR.   Lungs:   CTAB.  No wheezes, rales or rhonchi.    The patient is an appropriate candidate to undergo the planned procedure and sedation.    Delia Heady, MD

## 2012-07-27 NOTE — Progress Notes (Signed)
Swelling noted around biopsy site/bandage. Patient indicates that the area remains numb to touch and is without discomfort. Patient and family also indicate that the left neck was swollen prior to procedure mildly.

## 2012-07-27 NOTE — Procedures (Signed)
RADIOLOGY POST PROCEDURE NOTE     July 27, 2012       2:14 PM     Preoperative Diagnosis:   Left supraclavicular lymphadenopathy/    Postoperative Diagnosis:  Same.    Operator:  Dr. Larry Sierras    Assistant:  None.    Type of Anesthesia: 1% plain lidocaine and IV moderate sedation with Versed and Fentanyl    Procedure/Description:  US guided core needle Bx of the left supraclavicular LN    Findings:   See report.    Estimated blood Loss:  Minimal    Specimen Removed:   yes    Blood transfusions:  None.    Implants:  None.    Complications: None    Condition: Stable    Discharge Plan:  discharge home  after 2 hours of observation if stable and no bleeding.    Delia Heady, MD

## 2012-07-27 NOTE — Procedures (Signed)
Procedures  by Chanda Busing, MD at 07/27/12 1413                Author: Chanda Busing, MD  Service: --  Author Type: Physician       Filed: 07/27/12 1415  Date of Service: 07/27/12 1413  Status: Signed          Editor: Chanda Busing, MD (Physician)                         RADIOLOGY POST PROCEDURE NOTE          July 27, 2012       2:14 PM        Preoperative Diagnosis:   Left supraclavicular lymphadenopathy/      Postoperative Diagnosis:  Same.      Operator:  Dr. Larry Sierras      Assistant:  None.      Type of Anesthesia: 1% plain lidocaine and IV moderate sedation with Versed and Fentanyl      Procedure/Description:  US guided core needle Bx of the left supraclavicular LN      Findings:   See report.      Estimated blood Loss:  Minimal      Specimen Removed:   yes      Blood transfusions:  None.      Implants:  None.      Complications: None      Condition: Stable      Discharge Plan:  discharge home  after 2 hours of observation if stable and no bleeding.      Delia Heady, MD

## 2012-07-29 NOTE — Telephone Encounter (Signed)
Called patient on [phone] (home)  (non-secure) left message for her to call and set up an appointment to see Dr. Sidney Ace.  If patient calls to find out why please get Cheryl/cma

## 2012-07-29 NOTE — Progress Notes (Signed)
Quick Note:    Left patient message to call and set up appt.  ______

## 2012-07-29 NOTE — Progress Notes (Signed)
Quick Note:    Spoke with patient she will call back to set up appointment needs to arrange a ride  ______

## 2012-07-29 NOTE — Telephone Encounter (Signed)
Dr. Sidney Ace spoke with patient.

## 2012-07-29 NOTE — Telephone Encounter (Signed)
Patient called back advised that Dr. Sidney Ace needs to see her to discuss her pathology results.  Tried to set up an appointment patient stated she needs to talk with her Daughter-in-law to see when she can bring her in.  She will call back to set up appointment.

## 2012-07-29 NOTE — Progress Notes (Signed)
Quick Note:    Patient has follow up on 08/04/12 @ 11:40 am    ______

## 2012-07-29 NOTE — Telephone Encounter (Signed)
Patient's mother Kenton Kingfisher) called on behalf of her daughter today. Her daughter is very worried and would like to know the results of her biopsy. The only apt available is at 2pm on Friday but they cannot make that because her mother has to be to work at 3. She would like to know if she could have her results discussed with by a different doctor or if there is anything that can be done. Her mother can be reached at [phone].

## 2012-07-31 MED ORDER — ALPRAZOLAM 0.5 MG TAB
0.5 mg | ORAL_TABLET | Freq: Three times a day (TID) | ORAL | Status: DC | PRN
Start: 2012-07-31 — End: 2013-09-29

## 2012-07-31 NOTE — Telephone Encounter (Signed)
Left message for patient to return call to office.

## 2012-07-31 NOTE — Progress Notes (Signed)
Patient here today with Daughter who assists with history.    HISTORY OF PRESENT ILLNESS    Melissa Leblanc is a 46 y.o. year old female here today to follow up for:  Biopsy Results    Has had ultrasound and CT done suspicious for lymphoma and needle Bx also very suspicious but wants more Sx.    Does have some night sweats and some abdominal cramps but denies night sweats.    Current Outpatient Prescriptions   Medication Sig Dispense Refill   ??? LORazepam (ATIVAN) 0.5 mg tablet Take 1-2 Tabs by mouth every eight (8) hours as needed for Anxiety. 1/2 hour before procedure.  4 Tab  0   ??? PARoxetine (PAXIL) 20 mg tablet Take 1 Tab by mouth daily.  90 Tab  1   ??? lisinopril-hydrochlorothiazide (PRINZIDE, ZESTORETIC) 20-25 mg per tablet Take 1 Tab by mouth daily.  90 Tab  1   ??? solifenacin (VESICARE) 5 mg tablet Take 1 Tab by mouth daily.  90 Tab  1   ??? fluticasone (FLONASE) 50 mcg/actuation nasal spray 2 Sprays by Both Nostrils route daily.  1 Bottle  3   ??? naproxen (NAPROSYN) 500 mg tablet Take 1 Tab by mouth two (2) times daily (with meals).  20 Tab  0     No current facility-administered medications for this visit.     Facility-Administered Medications Ordered in Other Visits   Medication Dose Route Frequency Provider Last Rate Last Dose   ??? 0.9% sodium chloride infusion  100 mL/hr IntraVENous RAD CONTINUOUS Dmitri E Samoilov, MD       ??? sodium chloride (NS) flush 5-10 mL  5-10 mL IntraVENous Q8H Dmitri E Samoilov, MD       ??? sodium chloride (NS) flush 5-10 mL  5-10 mL IntraVENous PRN Delia Heady, MD         Past Medical History   Diagnosis Date   ??? Hypertension    ??? Prediabetes    ??? VAIN I (vaginal intraepithelial neoplasia grade I)        Objective:  BP 140/86   Pulse 79   Temp(Src) 100.6 ??F (38.1 ??C) (Oral)   Resp 16   Ht 5\' 5"  (1.651 m)   Wt 164 lb (74.39 kg)   BMI 27.29 kg/m2   SpO2 98%  GEN:  Appears stated age in NAD.  HEENT: Conjunctiva/lids normal.  External ears and nose without lesions/trauma.  Hearing Intact.   Tongue midline.  NECK: Trachea midline.  Supple.  Full ROM  CARDIAC:  regular rate and rhythm. no Murmur, no peripheral edema.  LUNGS: lungs clear to auscultation, no accessory muscle use.  MS: no clubbing/cyanosis.  Strange swelling left neck and infraclavicular.  SKIN: Warm/dry without rash.      Assessment/Plan:   1. Neck mass  REFERRAL TO GENERAL SURGERY   2. Adenopathy  REFERRAL TO GENERAL SURGERY   3. Lymphoma  REFERRAL TO GENERAL SURGERY   4. Anxiety  ALPRAZolam (XANAX) 0.5 mg tablet     Will refer to gen surgery and await biopsy results.  Discussed likely lymphoma and need for tissue sample and likely plan going forward.  Xanax as needed.

## 2012-07-31 NOTE — Patient Instructions (Signed)
Anxiety Disorder: After Your Visit  Your Care Instructions  Anxiety is a normal reaction to stress. Difficult situations can cause you to have symptoms such as sweaty palms and a nervous feeling.  In an anxiety disorder, the symptoms are far more severe. Constant worry, muscle tension, trouble sleeping, nausea and diarrhea, and other symptoms can make normal daily activities difficult or impossible. These symptoms may occur for no reason, and they can affect your work, school, or social life. Medicines, counseling, and self-care can all help.  Follow-up care is a key part of your treatment and safety. Be sure to make and go to all appointments, and call your doctor if you are having problems. It's also a good idea to know your test results and keep a list of the medicines you take.  How can you care for yourself at home?  ?? Take medicines exactly as directed. Call your doctor if you think you are having a problem with your medicine.  ?? Go to your counseling sessions and follow-up appointments.  ?? Recognize and accept your anxiety. Then, when you are in a situation that makes you anxious, say to yourself, "This is not an emergency. I feel uncomfortable, but I am not in danger. I can keep going even if I feel anxious."  ?? Be kind to your body:  ?? Relieve tension with exercise or a massage.  ?? Get enough rest.  ?? Avoid alcohol, caffeine, nicotine, and illegal drugs. They can increase your anxiety level and cause sleep problems.  ?? Learn and do relaxation techniques. See below for more about these techniques.  ?? Engage your mind. Get out and do something you enjoy. Go to a funny movie, or take a walk or hike. Plan your day. Having too much or too little to do can make you anxious.  ?? Keep a record of your symptoms. Discuss your fears with a good friend or family member, or join a support group for people with similar problems. Talking to others sometimes relieves stress.  ?? Get involved in social groups, or volunteer  to help others. Being alone sometimes makes things seem worse than they are.  ?? Get at least 30 minutes of exercise on most days of the week to relieve stress. Walking is a good choice. You also may want to do other activities, such as running, swimming, cycling, or playing tennis or team sports.  Relaxation techniques  Do relaxation exercises 10 to 20 minutes a day. You can play soothing, relaxing music while you do them, if you wish.  ?? Tell others in your house that you are going to do your relaxation exercises. Ask them not to disturb you.  ?? Find a comfortable place, away from all distractions and noise.  ?? Lie down on your back, or sit with your back straight.  ?? Focus on your breathing. Make it slow and steady.  ?? Breathe in through your nose. Breathe out through either your nose or mouth.  ?? Breathe deeply, filling up the area between your navel and your rib cage. Breathe so that your belly goes up and down.  ?? Do not hold your breath.  ?? Breathe like this for 5 to 10 minutes. Notice the feeling of calmness throughout your whole body.  As you continue to breathe slowly and deeply, relax by doing the following for another 5 to 10 minutes:  ?? Tighten and relax each muscle group in your body. You can begin at your toes and work   your way up to your head.  ?? Imagine your muscle groups relaxing and becoming heavy.  ?? Empty your mind of all thoughts.  ?? Let yourself relax more and more deeply.  ?? Become aware of the state of calmness that surrounds you.  ?? When your relaxation time is over, you can bring yourself back to alertness by moving your fingers and toes and then your hands and feet and then stretching and moving your entire body. Sometimes people fall asleep during relaxation, but they usually wake up shortly afterward.  ?? Always give yourself time to return to full alertness before you drive a car or do anything that might cause an accident if you are not fully alert. Never play a relaxation tape while  you drive a car.  When should you call for help?  Call 911 anytime you think you may need emergency care. For example, call if:  ?? You feel you cannot stop from hurting yourself or someone else.  Watch closely for changes in your health, and be sure to contact your doctor if:  ?? You have anxiety or fear that affects your life.  ?? You have symptoms of anxiety that are new or different from those you had before.    Where can you learn more?    Go to http://www.healthwise.net/[facility]   Enter P754 in the search box to learn more about "Anxiety Disorder: After Your Visit."    ?? 2006-2013 Healthwise[employer]. Care instructions adapted under license by [employer] (which disclaims liability or warranty for this information). This care instruction is for use with your licensed healthcare professional. If you have questions about a medical condition or this instruction, always ask your healthcare professional. Healthwise, Incorporated disclaims any warranty or liability for your use of this information.  Content Version: 9.8.193578; Last Revised: February 13, 2010
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
## 2012-08-24 ENCOUNTER — Encounter

## 2012-08-24 LAB — CBC WITH AUTOMATED DIFF
ABS. BASOPHILS: 0 10*3/uL (ref 0.0–0.06)
ABS. EOSINOPHILS: 0 10*3/uL (ref 0.0–0.4)
ABS. LYMPHOCYTES: 1.6 10*3/uL (ref 0.9–3.6)
ABS. MONOCYTES: 0.5 10*3/uL (ref 0.05–1.2)
ABS. NEUTROPHILS: 2.6 10*3/uL (ref 1.8–8.0)
BASOPHILS: 0 % (ref 0–2)
EOSINOPHILS: 0 % (ref 0–5)
HCT: 35.2 % (ref 35.0–45.0)
HGB: 11.6 g/dL — ABNORMAL LOW (ref 12.0–16.0)
LYMPHOCYTES: 34 % (ref 21–52)
MCH: 26.6 PG (ref 24.0–34.0)
MCHC: 33 g/dL (ref 31.0–37.0)
MCV: 80.7 FL (ref 74.0–97.0)
MONOCYTES: 10 % (ref 3–10)
MPV: 10.2 FL (ref 9.2–11.8)
NEUTROPHILS: 56 % (ref 40–73)
PLATELET: 312 10*3/uL (ref 135–420)
RBC: 4.36 M/uL (ref 4.20–5.30)
RDW: 15.7 % — ABNORMAL HIGH (ref 11.6–14.5)
WBC: 4.7 10*3/uL (ref 4.6–13.2)

## 2012-08-24 LAB — METABOLIC PANEL, BASIC
Anion gap: 6 mmol/L (ref 3.0–18)
BUN/Creatinine ratio: 8 — ABNORMAL LOW (ref 12–20)
BUN: 6 MG/DL — ABNORMAL LOW (ref 7.0–18)
CO2: 29 MMOL/L (ref 21–32)
Calcium: 9 MG/DL (ref 8.5–10.1)
Chloride: 105 MMOL/L (ref 100–108)
Creatinine: 0.78 MG/DL (ref 0.6–1.3)
GFR est AA: >60 mL/min/{1.73_m2} (ref >60)
GFR est non-AA: >60 mL/min/{1.73_m2} (ref >60)
Glucose: 85 MG/DL (ref 74–99)
Potassium: 4.5 MMOL/L (ref 3.5–5.5)
Sodium: 140 MMOL/L (ref 136–145)

## 2012-08-24 LAB — EKG, 12 LEAD, INITIAL
Atrial Rate: 75 {beats}/min
Calculated P Axis: 43 degrees
Calculated R Axis: 39 degrees
Calculated T Axis: 31 degrees
Diagnosis: NORMAL
P-R Interval: 154 ms
Q-T Interval: 390 ms
QRS Duration: 86 ms
QTC Calculation (Bezet): 435 ms
Ventricular Rate: 75 {beats}/min

## 2012-08-26 ENCOUNTER — Inpatient Hospital Stay: Payer: MEDICARE

## 2012-08-26 LAB — DRUG SCREEN, URINE
AMPHETAMINES: NEGATIVE
BARBITURATES: NEGATIVE
BENZODIAZEPINES: NEGATIVE
COCAINE: NEGATIVE
METHADONE: NEGATIVE
OPIATES: NEGATIVE
PCP(PHENCYCLIDINE): NEGATIVE
THC (TH-CANNABINOL): NEGATIVE

## 2012-08-26 LAB — GLUCOSE, POC
Glucose (POC): 90 mg/dL (ref 70–110)
Glucose (POC): 94 mg/dL (ref 70–110)

## 2012-08-26 MED ORDER — BUPIVACAINE (PF) 0.25 % (2.5 MG/ML) IJ SOLN
0.25 % (2.5 mg/mL) | INTRAMUSCULAR | Status: DC | PRN
Start: 2012-08-26 — End: 2012-08-26
  Administered 2012-08-26: 14:00:00

## 2012-08-26 MED ORDER — SODIUM CHLORIDE 0.9 % IJ SYRG
INTRAMUSCULAR | Status: DC | PRN
Start: 2012-08-26 — End: 2012-08-26

## 2012-08-26 MED ORDER — PROMETHAZINE 25 MG/ML INJECTION
25 mg/mL | INTRAMUSCULAR | Status: DC | PRN
Start: 2012-08-26 — End: 2012-08-26

## 2012-08-26 MED ORDER — HYDROCODONE-ACETAMINOPHEN 5 MG-325 MG TAB
5-325 mg | ORAL_TABLET | ORAL | Status: DC
Start: 2012-08-26 — End: 2012-09-01

## 2012-08-26 MED ORDER — NALOXONE 0.4 MG/ML INJECTION
0.4 mg/mL | INTRAMUSCULAR | Status: DC | PRN
Start: 2012-08-26 — End: 2012-08-26

## 2012-08-26 MED ORDER — BUPIVACAINE (PF) 0.25 % (2.5 MG/ML) IJ SOLN
0.25 % (2.5 mg/mL) | INTRAMUSCULAR | Status: AC
Start: 2012-08-26 — End: ?

## 2012-08-26 MED ORDER — LACTATED RINGERS IV
INTRAVENOUS | Status: DC
Start: 2012-08-26 — End: 2012-08-26

## 2012-08-26 MED ORDER — MIDAZOLAM 1 MG/ML IJ SOLN
1 mg/mL | INTRAMUSCULAR | Status: AC
Start: 2012-08-26 — End: ?

## 2012-08-26 MED ORDER — CEFAZOLIN 2 GM/50 ML IN DEXTROSE (ISO-OSMOTIC) IVPB
2 gram/50 mL | Freq: Once | INTRAVENOUS | Status: DC
Start: 2012-08-26 — End: 2012-08-26

## 2012-08-26 MED ORDER — HYDROMORPHONE (PF) 2 MG/ML IJ SOLN
2 mg/mL | INTRAMUSCULAR | Status: DC | PRN
Start: 2012-08-26 — End: 2012-08-26
  Administered 2012-08-26 (×2): via INTRAVENOUS

## 2012-08-26 MED ORDER — FENTANYL CITRATE (PF) 50 MCG/ML IJ SOLN
50 mcg/mL | INTRAMUSCULAR | Status: AC
Start: 2012-08-26 — End: ?

## 2012-08-26 MED ORDER — SODIUM CHLORIDE 0.9 % IJ SYRG
Freq: Three times a day (TID) | INTRAMUSCULAR | Status: DC
Start: 2012-08-26 — End: 2012-08-26

## 2012-08-26 MED ORDER — CEFAZOLIN 2 GM/50 ML IN DEXTROSE (ISO-OSMOTIC) IVPB
2 gram/50 mL | INTRAVENOUS | Status: AC
Start: 2012-08-26 — End: 2012-08-26
  Administered 2012-08-26: 14:00:00 via INTRAVENOUS

## 2012-08-26 MED ORDER — LACTATED RINGERS IV
INTRAVENOUS | Status: DC
Start: 2012-08-26 — End: 2012-08-26
  Administered 2012-08-26: 13:00:00 via INTRAVENOUS

## 2012-08-26 MED ORDER — ONDANSETRON (PF) 4 MG/2 ML INJECTION
4 mg/2 mL | Freq: Once | INTRAMUSCULAR | Status: DC | PRN
Start: 2012-08-26 — End: 2012-08-26
  Administered 2012-08-26: 16:00:00 via INTRAVENOUS

## 2012-08-26 MED ORDER — FAMOTIDINE 20 MG TAB
20 mg | Freq: Once | ORAL | Status: AC
Start: 2012-08-26 — End: 2012-08-26
  Administered 2012-08-26: 13:00:00 via ORAL

## 2012-08-26 MED FILL — BD POSIFLUSH NORMAL SALINE 0.9 % INJECTION SYRINGE: INTRAMUSCULAR | Qty: 10

## 2012-08-26 MED FILL — MIDAZOLAM 1 MG/ML IJ SOLN: 1 mg/mL | INTRAMUSCULAR | Qty: 2

## 2012-08-26 MED FILL — BUPIVACAINE (PF) 0.25 % (2.5 MG/ML) IJ SOLN: 0.25 % (2.5 mg/mL) | INTRAMUSCULAR | Qty: 30

## 2012-08-26 MED FILL — LACTATED RINGERS IV: INTRAVENOUS | Qty: 1000

## 2012-08-26 MED FILL — DILAUDID (PF) 2 MG/ML INJECTION SOLUTION: 2 mg/mL | INTRAMUSCULAR | Qty: 1

## 2012-08-26 MED FILL — CEFAZOLIN 2 GM/50 ML IN DEXTROSE (ISO-OSMOTIC) IVPB: 2 gram/50 mL | INTRAVENOUS | Qty: 50

## 2012-08-26 MED FILL — ONDANSETRON HCL 4 MG/2 ML INTRAVENOUS SYRINGE: 4 mg/2 mL | INTRAVENOUS | Qty: 2

## 2012-08-26 MED FILL — FENTANYL CITRATE (PF) 50 MCG/ML IJ SOLN: 50 mcg/mL | INTRAMUSCULAR | Qty: 2

## 2012-08-26 MED FILL — FAMOTIDINE 20 MG TAB: 20 mg | ORAL | Qty: 1
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
## 2012-08-26 NOTE — Op Note (Signed)
[facility]                               [address] [place], [place] [address]                                 OPERATIVE REPORT    PATIENT:     Melissa Leblanc, Melissa Leblanc  MRN:            [id number]      DATE:   08/26/2012  BILLING:     [id number]  ROOM:        PACUPACUPL  ATTENDING:   Marylyn Ishihara, MD  DICTATING:   Marylyn Ishihara, MD      PREOPERATIVE DIAGNOSIS: Left supraclavicular lymphadenopathy.    POSTOPERATIVE DIAGNOSIS: Left supraclavicular lymphadenopathy.    PROCEDURES PERFORMED: Excisional biopsy of left supraclavicular lymph  nodes.    SURGEON: Jezreel Justiniano P. Mayford Knife, MD    ASSISTANT: Lucretia Roers    ANESTHESIA: General and local (0.25% Marcaine plain, 1% lidocaine plain 1:1  solution).    FINDINGS: Three palpable lymph nodes in the left supraclavicular region.    SPECIMENS REMOVED: Two lymph nodes, one for permanent, the other one fresh  for flow cytology.    ESTIMATED BLOOD LOSS: 10 mL.    FLUIDS GIVEN: 700 mL.    OPERATIVE INDICATION: Painful supraclavicular lymphadenopathy on the left  in a patient who is a tobacco abuser.    DESCRIPTION OF PROCEDURE: The patient was identified in the holding area,  where consent for a left supraclavicular lymph node excisional biopsy was  verified. In the operating room, the patient was placed under general  anesthesia. She did receive perioperative cefazolin 2 g. The left chest  wall and neck were prepped and draped in sterile fashion using  chlorhexidine solution with a 3 minute dry time for the chlorhexidine.  Sterile drapes were applied.   The time-out was performed to ensure correct procedure. Local anesthetic  was infiltrated into the skin and deep dermal tissues overlying the  enlarged lymph nodes. The incision was made using a #15 blade through the  skin and subcutaneous tissue. The rest of the dissection down to the lymph  nodes through the platysma was performed using electrocautery. A   combination of sharp dissection and electrocautery was used to expose the  lymph nodes. The lymphatic vessels were clipped with medium size clip, and  ligated.    Two lymph nodes were removed, the first was sent fresh for flow cytometry.  The second was sent for permanent pathology.    The area was irrigated using saline solution. Hemostasis was obtained using  electrocautery. A Surgicel was placed into the cavity to prevent hematoma  formation. The platysma was reapproximated using 3-0 Vicryl suture in a  running fashion. Running subcuticular stitch of 4-0 Monocryl was placed in  the skin. The patient tolerated the procedure very well. Sterile dressings  were applied.    DISPOSITION: The patient was extubated and stable upon transport to the  recovery room.                     Marylyn Ishihara, MD    CPW:wmx  D: 08/26/2012 T: 08/26/2012  10:36 A  Job: [id number]  CScriptDoc #: [id number]  cc:   Elissa Hefty [facility], DO        Marylyn Ishihara, MD

## 2012-08-26 NOTE — Brief Op Note (Signed)
BRIEF OPERATIVE NOTE    Date of Procedure: 08/26/2012   Preoperative Diagnosis: 785.16  Postoperative Diagnosis: 785.16    Procedure(s):  LEFT SUPRACLAVICULAR LYMPH NODE BIOPSY  Surgeon(s) and Role:     * Marylyn Ishihara, MD - Primary  Anesthesia: General   Estimated Blood Loss: 10 mL  Specimens:   ID Type Source Tests Collected by Time Destination   1 : LEFT LYMPH NODE Fresh Lymph Node  Marylyn Ishihara, MD 08/26/2012 0935 Pathology   2 : LEFT LYMPH NODE (NECK) Preservative Lymph Node  Marylyn Ishihara, MD 08/26/2012 1000 Pathology      Findings: lymphadenopathy   Complications: None  Implants: * No implants in log *  Job ID:  [id number]

## 2012-08-26 NOTE — Progress Notes (Signed)
Post-Anesthesia Evaluation & Assessment    Visit Vitals   Item Reading   ??? BP 130/84   ??? Pulse 61   ??? Temp 97.5 ??F (36.4 ??C)   ??? Resp 19   ??? Ht 5\' 5"  (1.651 m)   ??? Wt 73.483 kg (162 lb)   ??? BMI 26.96 kg/m2   ??? SpO2 98%       Nausea/Vomiting: no nausea and no vomiting    Post-operative hydration adequate.    Pain score (VAS): 0    Mental status & Level of consciousness: alert and oriented x 3    Neurological status: moves all extremities, sensation grossly intact    Pulmonary status: airway patent, no supplemental oxygen required    Complications related to anesthesia: none    Patient has met all discharge requirements.    Additional comments:        Philomena Course, MD

## 2012-08-26 NOTE — Interval H&P Note (Signed)
H&P Update:  Zaylia Riolo was seen and examined.  History and physical has been reviewed. There have been no significant clinical changes since the completion of the originally dated History and Physical.    Signed By: Marylyn Ishihara, MD     August 26, 2012 6:25 AM

## 2012-08-26 NOTE — Progress Notes (Signed)
Patient armband removed and shredded  If you experience chest pain call 911. Do not drive yourself.     I have reviewed discharge instructions with the patient and daughter in law.  The patient and daughter in law verbalized understanding.

## 2012-08-26 NOTE — Op Note (Signed)
[facility]                               [address] [place], [place] [address]                                 OPERATIVE REPORT    PATIENT:     Melissa Leblanc, Melissa Leblanc  MRN:            [id number]      DATE:   08/26/2012  BILLING:     [id number]  ROOM:        PACUPACUPL  ATTENDING:   Marylyn Ishihara, MD  DICTATING:   Marylyn Ishihara, MD      PREOPERATIVE DIAGNOSIS: Left supraclavicular lymphadenopathy.    POSTOPERATIVE DIAGNOSIS: Left supraclavicular lymphadenopathy.    PROCEDURES PERFORMED: Excisional biopsy of left supraclavicular lymph  nodes.    SURGEON: Teriyah Purington P. Mayford Knife, MD    ASSISTANT: Lucretia Roers    ANESTHESIA: General and local (0.25% Marcaine plain, 1% lidocaine plain 1:1  solution).    FINDINGS: Three palpable lymph nodes in the left supraclavicular region.    SPECIMENS REMOVED: Two lymph nodes, one for permanent, the other one fresh  for flow cytology.    ESTIMATED BLOOD LOSS: 10 mL.    FLUIDS GIVEN: 700 mL.    OPERATIVE INDICATION: Painful supraclavicular lymphadenopathy on the left  in a patient who is a tobacco abuser.    DESCRIPTION OF PROCEDURE: The patient was identified in the holding area,  where consent for a left supraclavicular lymph node excisional biopsy was  verified. In the operating room, the patient was placed under general  anesthesia. She did receive perioperative cefazolin 2 g. The left chest  wall and neck were prepped and draped in sterile fashion using  chlorhexidine solution with a 3 minute dry time for the chlorhexidine.  Sterile drapes were applied.   The time-out was performed to ensure correct procedure. Local anesthetic  was infiltrated into the skin and deep dermal tissues overlying the  enlarged lymph nodes. The incision was made using a #15 blade through the  skin and subcutaneous tissue. The rest of the dissection down to the lymph  nodes through the platysma was performed using electrocautery.  A  combination of sharp dissection and electrocautery was used to expose the  lymph nodes. The lymphatic vessels were clipped with medium size clip, and  ligated.    Two lymph nodes were removed, the first was sent fresh for flow cytometry.  The second was sent for permanent pathology.    The area was irrigated using saline solution. Hemostasis was obtained using  electrocautery. A Surgicel was placed into the cavity to prevent hematoma  formation. The platysma was reapproximated using 3-0 Vicryl suture in a  running fashion. Running subcuticular stitch of 4-0 Monocryl was placed in  the skin. The patient tolerated the procedure very well. Sterile dressings  were applied.    DISPOSITION: The patient was extubated and stable upon transport to the  recovery room.                     Marylyn Ishihara, MD    CPW:wmx  D: 08/26/2012 T: 08/26/2012  10:36 A  Job: [id number]  CScriptDoc #: [id number]  cc:   Elissa Hefty [facility], DO        Marylyn Ishihara, MD

## 2012-08-28 MED FILL — ONDANSETRON (PF) 4 MG/2 ML INJECTION: 4 mg/2 mL | INTRAMUSCULAR | Qty: 2

## 2012-08-28 MED FILL — KETOROLAC TROMETHAMINE 30 MG/ML INJECTION: 30 mg/mL (1 mL) | INTRAMUSCULAR | Qty: 1

## 2012-08-28 MED FILL — LIDOCAINE (PF) 20 MG/ML (2 %) IJ SOLN: 20 mg/mL (2 %) | INTRAMUSCULAR | Qty: 3.75

## 2012-08-28 MED FILL — LACTATED RINGERS IV: INTRAVENOUS | Qty: 1000

## 2012-08-28 MED FILL — DIPRIVAN 10 MG/ML INTRAVENOUS EMULSION: 10 mg/mL | INTRAVENOUS | Qty: 20

## 2012-09-01 MED ORDER — HYDROCODONE-ACETAMINOPHEN 5 MG-325 MG TAB
5-325 mg | ORAL_TABLET | ORAL | Status: DC
Start: 2012-09-01 — End: 2013-01-15

## 2012-09-01 MED ORDER — HYDROCODONE-ACETAMINOPHEN 5 MG-325 MG TAB
5-325 mg | ORAL_TABLET | Freq: Four times a day (QID) | ORAL | Status: DC | PRN
Start: 2012-09-01 — End: 2013-01-15

## 2012-09-01 NOTE — Telephone Encounter (Signed)
The patient had surgery on 08-26-2012 by Dr Mayford Knife. The patient was given Vicodin for pain. The pt called to say that she is experiencing numbness and itching . I have advised the pt to stop taking the medication and to take tylenol for pain and to apply cool compresses to the area. I have sent a message to Dr Mayford Knife via phone to advise him and to let him know the patient is requesting another medication. The patient is aware that Dr Mayford Knife is not in the office . I will call the patient with a new medication order when one is ordered.

## 2012-09-04 NOTE — Telephone Encounter (Signed)
Pt called office would like to know if provider has results from her lymph node surgery. She can be reached at [phone]. Thanks

## 2012-09-04 NOTE — Telephone Encounter (Signed)
Discussed likely Hodgkins and need for referral to onc and mediport to prepare for chemo.  All questions answered.

## 2012-09-04 NOTE — Progress Notes (Signed)
Eliyanah Elgersma P. Mayford Knife, M.D. FACS  PROGRESS NOTE    Name: Melissa Leblanc MRN: [id number]   DOB: Nov 20, 1965 Hospital: [facility]   Date: 09/04/2012 Admission Date: No admission date for patient encounter.     Subjective:  Pt without c/o.  We discussed the diagnosis of Hodgkin's lymphoma and the need to see Dr. Alanda Slim - med onc.  Also the need for mediport placement   Objective:  BP 124/64   Pulse 72   Temp(Src) 99.6 ??F (37.6 ??C) (Oral)   Resp 16   Ht 5\' 5"  (1.651 m)   Wt 73.483 kg (162 lb)   BMI 26.96 kg/m2    Physical Exam:    General: well developed and well nourished    Skin:  Left anterior neck incision healing well.      Labs:  No results found for this or any previous visit (from the past 24 hour(s)).    Current Medications:  Current Outpatient Prescriptions   Medication Sig Dispense Refill   ??? HYDROcodone-acetaminophen (NORCO) 5-325 mg per tablet Take 1-2 Tabs by mouth every six (6) hours as needed for Pain.  40 Tab  0   ??? HYDROcodone-acetaminophen (NORCO) 5-325 mg per tablet 1- 2 tablets every 4 -6 hours as needed for pain  30 Tab  o   ??? ALPRAZolam (XANAX) 0.5 mg tablet Take 1 Tab by mouth three (3) times daily as needed for Anxiety.  30 Tab  0   ??? LORazepam (ATIVAN) 0.5 mg tablet Take 1-2 Tabs by mouth every eight (8) hours as needed for Anxiety. 1/2 hour before procedure.  4 Tab  0   ??? PARoxetine (PAXIL) 20 mg tablet Take 1 Tab by mouth daily.  90 Tab  1   ??? lisinopril-hydrochlorothiazide (PRINZIDE, ZESTORETIC) 20-25 mg per tablet Take 1 Tab by mouth daily.  90 Tab  1   ??? solifenacin (VESICARE) 5 mg tablet Take 1 Tab by mouth daily.  90 Tab  1   ??? fluticasone (FLONASE) 50 mcg/actuation nasal spray 2 Sprays by Both Nostrils route daily.  1 Bottle  3   ??? naproxen (NAPROSYN) 500 mg tablet Take 1 Tab by mouth two (2) times daily (with meals).  20 Tab  0     No current facility-administered medications for this visit.       Chart and notes reviewed. Data reviewed. I have evaluated and examined the patient.         IMPRESSION:   ?? Pt with sclerosing Hodgkin's lymphoma.       PLAN:/DISCUSION:   ?? CT chest abd pelvis  ?? Referral to Dr. Alanda Slim  ?? F/u in 3 wks        Marylyn Ishihara, MD

## 2012-09-10 ENCOUNTER — Encounter

## 2012-09-10 MED ORDER — IOVERSOL 320 MG/ML IV SOLN
320 mg iodine/mL | Freq: Once | INTRAVENOUS | Status: AC
Start: 2012-09-10 — End: 2012-09-10
  Administered 2012-09-10: 18:00:00 via INTRAVENOUS

## 2012-09-10 MED FILL — OPTIRAY 320 MG IODINE/ML INTRAVENOUS SOLUTION: 320 mg iodine/mL | INTRAVENOUS | Qty: 100

## 2012-09-11 LAB — CREATININE, POC
Creatinine, POC: 0.6 MG/DL (ref 0.6–1.3)
GFRAA, POC: >60 mL/min/{1.73_m2} (ref >60)
GFRNA, POC: >60 mL/min/{1.73_m2} (ref >60)

## 2012-09-11 NOTE — Progress Notes (Signed)
Hematology/Oncology Consultation Note    Name: Melissa Leblanc  Date: 09/11/2012  DOB: [birth year]    CP: Steffanie Dunn, M.D.    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    Subjective:   Chief complaint: Hodgkin's disease    History of present illness:  Melissa Leblanc is a 47 year old African American woman who has recently been diagnosed with nodular sclerosing Hodgkin's disease involving the lymph nodes of the neck.  The patient reports that since November of 2013 she has noticed progressive swelling in the neck most notably the left side of the neck.  She has also experienced unexplained fevers, night sweats, and weight loss.  On 07/09/2012 a CT scan of the left neck revealed bilateral lymphadenopathy in the lower neck most pronounced in the left, in the posterior triangle, and supraclavicular region.  There was also evidence of lymphadenopathy in the superior mediastinum.  This was concerning for lymphoma.  The patient was subsequently seen by Dr. Mayford Knife and on 08/26/2012 she had a lymph node dissection on the left neck and this was positive for nodular sclerosing Hodgkin's disease.  She has now been referred to medical oncology for a complete assessment and treatment recommendations.  She had CT scans of the chest, abdomen, and pelvis done yesterday but results are not available for review.  She continues to experience fevers, night sweats, and weight loss.  Past Medical History   Diagnosis Date   ??? Hypertension    ??? Prediabetes    ??? VAIN I (vaginal intraepithelial neoplasia grade I)    ??? Anxiety    ??? Lymphoma    ??? Neck mass        No Known Allergies    Past Surgical History   Procedure Laterality Date   ??? Hx gyn       BTL   ??? Hx hysterectomy  2004       History     Social History   ??? Marital Status: SINGLE     Spouse Name: N/A     Number of Children: N/A   ??? Years of Education: N/A     Occupational History   ??? Not on file.     Social  History Main Topics   ??? Smoking status: Current Every Day Smoker -- 0.25 packs/day for 25 years   ??? Smokeless tobacco: Never Used   ??? Alcohol Use: 3.0 oz/week     6 Cans of beer per week      Comment: every 3 to 4 days   ??? Drug Use: Yes     Special: Marijuana      Comment: one or 2 puffs occasionally   ??? Sexually Active: No     Other Topics Concern   ??? Not on file     Social History Narrative   ??? No narrative on file       Family History   Problem Relation Age of Onset   ??? Cancer Mother    ??? Breast Cancer Mother    ??? Cancer Father    ??? Alcohol abuse Neg Hx    ??? Arthritis-rheumatoid Neg Hx    ??? Asthma Neg Hx    ??? Bleeding Prob Neg Hx    ??? Diabetes Neg Hx    ??? Elevated Lipids Neg Hx    ??? Headache Neg Hx    ??? Heart Disease Neg Hx    ???  Hypertension Neg Hx    ??? Lung Disease Neg Hx    ??? Migraines Neg Hx    ??? Psychiatric Disorder Neg Hx    ??? Stroke Neg Hx    ??? Mental Retardation Neg Hx        Current Outpatient Prescriptions   Medication Sig Dispense Refill   ??? HYDROcodone-acetaminophen (NORCO) 5-325 mg per tablet Take 1-2 Tabs by mouth every six (6) hours as needed for Pain.  40 Tab  0   ??? HYDROcodone-acetaminophen (NORCO) 5-325 mg per tablet 1- 2 tablets every 4 -6 hours as needed for pain  30 Tab  o   ??? ALPRAZolam (XANAX) 0.5 mg tablet Take 1 Tab by mouth three (3) times daily as needed for Anxiety.  30 Tab  0   ??? LORazepam (ATIVAN) 0.5 mg tablet Take 1-2 Tabs by mouth every eight (8) hours as needed for Anxiety. 1/2 hour before procedure.  4 Tab  0   ??? PARoxetine (PAXIL) 20 mg tablet Take 1 Tab by mouth daily.  90 Tab  1   ??? lisinopril-hydrochlorothiazide (PRINZIDE, ZESTORETIC) 20-25 mg per tablet Take 1 Tab by mouth daily.  90 Tab  1   ??? solifenacin (VESICARE) 5 mg tablet Take 1 Tab by mouth daily.  90 Tab  1   ??? fluticasone (FLONASE) 50 mcg/actuation nasal spray 2 Sprays by Both Nostrils route daily.  1 Bottle  3   ??? naproxen (NAPROSYN) 500 mg tablet Take 1 Tab by mouth two (2) times daily (with meals).  20 Tab  0      No current facility-administered medications for this visit.     Review of Systems    General ROS:The patient he is complaining of fevers, night sweats, and weight loss.  She also has enlarged lymph glands in the neck area.  Recently she had a biopsy which confirmed lymphoma.  Psychological ROS: patient denies having any psychological symptoms such as hallucinations depression or anxiety.  Ophthalmic ROS:the patient denies having any visual impairment or eye discomfort.  ENT ROS: there are no abnormalities reported.  Allergy and Immunology ROS:the patient denies having any seasonal allergies or allergies to medications other than those already outlined above.  Hematological and Lymphatic ROS: the patient denies having any bruising, bleeding or lymphadenopathy.  Endocrine ROS: the patient denies having any heat or cold intolerance.  There is no history of diabetes or thyroid disorders.  Breast ROS: the patient denies having any history of breast mass, nipple discharge, or lumps.  Respiratory ROS:the patient denies having any cough, shortness of breath, or dyspnea on exertion.  Cardiovascular ROS: there are no complaints of chest pain, palpitations, chest pounding, or dyspnea on exertion.  Gastrointestinal ROS: the patient denies having nausea, emesis, diarrhea, constipation, or blood in the stool.  Genito-Urinary ROS: the patient denies having urinary urgency, frequency, or dysuria.  Musculoskeletal ROS: with the exception of mild arthralgias the patient has no other musculoskeletal complaints.  Neurological ROS: he denies having any numbness, tingling, or neurologic deficits.  Dermatological GNF:AOZHYQM denies having any unexplained rash, skin ulcerations, or hives.      Objective:   BP 120/80   Pulse 88   Temp(Src) 98.8 ??F (37.1 ??C)   Ht 5\' 5"  (1.651 m)   Wt 72.576 kg (160 lb)   BMI 26.63 kg/m2     Physical Exam:   Gen. Appearance: the patient is in no acute distress.  Skin: There is no evidence of bruise or rash.  HEENT: The head is normocephalic and atraumatic.  The conjunctiva and sclera are clear.  Pupils are equal, round, reactive to light, and accommodation.  The extraocular movements are intact.  ENT reveals no oral mucosal lesions or ulcerations.  Neck: Supple with lymphadenopathy which is more pronounced on the left supraclavicular area.  A small palpable lymph glands are noted on the right as well.  There is no thyromegaly.  Lungs: Clear to auscultation and percussion; there are no wheezes or rhonchi.  Heart: Regular rate and rhythm; there are no murmurs, gallops, or rubs.  Abdomen: Bowel sounds are present and normal.  There is no guarding, tenderness, or hepatosplenomegaly.  Neurologic: There are no focal neurologic deficits.  Lymphatics: There is no palpable axillary or inguinal lymphadenopathy.    Lab data:  Pathology report from 08/26/2012 confirmed nodular sclerosing question lymphoma in the left neck lymph node.  Her  CT scans from 07/09/2012 confirmed bilateral lymphadenopathy in the lower neck most pronounced on the left involving the posterior triangle and supraclavicular regions.  There was also superior mediastinal lymphadenopathy.         Assessment:   1. Nodular sclerosing Hodgkin's lymphoma    Plan:   The patient has completed CT scans of the chest, abdomen, and pelvis and the results of pending.  We will also schedule a PET scans as well.  The patient will need a bone marrow biopsy as part of her staging and I have therefore reviewed the risk, benefits, and side effects of the procedure.  We will tentatively plan to perform the bone marrow biopsy on 09/18/2012.  On the day of the biopsy additional blood tests including a metabolic panel, sedimentation rate, liver function tests, a CBC, and iron profile will be ordered.  The patient will also be referred back to the surgeon to have a power port inserted in preparation for her chemotherapy which most likely will be comprised of the ABVD regimen.    Orders  Placed This Encounter   ??? PET/CT TUMOR IMAGE SKULL THIGH W (INI)     Standing Status: Future      Number of Occurrences:       Standing Expiration Date: 10/12/2013     Order Specific Question:  Reason for Exam     Answer:  recently diagnosed lymphoma     Order Specific Question:  Is Patient Allergic to Contrast Dye?     Answer:  No     Order Specific Question:  Is Patient Pregnant?     Answer:  No           Kennon Holter, MD  09/11/2012

## 2012-09-14 NOTE — Progress Notes (Signed)
LM on daughter's cell at 1209pm regarding appt with Dr Thad Ranger for her mother.  appt is 09/15/12 @ 9am.

## 2012-09-15 DIAGNOSIS — C8191 Hodgkin lymphoma, unspecified, lymph nodes of head, face, and neck: Secondary | ICD-10-CM | POA: Insufficient documentation
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
## 2012-09-18 LAB — AMB POC COMPLETE CBC, AUTOMATED ONCOLOGY
ABS. GRANS (POC): 2.5 10*3/uL (ref 1.8–9.5)
HCT (POC): 38.7 % (ref 36–48)
HGB (POC): 11.7 g/dL — AB (ref 12–16)
LYMPHOCYTES (POC): 24.3 % (ref 24–44)
PLATELET (POC): 312 10*3/uL (ref 140–440)
WBC (POC): 3.9 10*3/uL — AB (ref 4.5–13)

## 2012-09-18 NOTE — Addendum Note (Signed)
Addended by: Rosezena Sensor on: 09/18/2012 12:16 PM     Modules accepted: Orders

## 2012-09-18 NOTE — Progress Notes (Signed)
Procedure note/bone marrow biopsy    09/18/2012  [name]  Date of birth 1966/07/04    Diagnosis: Hodgkin's lymphoma    Melissa Leblanc consented to undergo a bone marrow biopsy as part of her staging evaluation for Hodgkin lymphoma.  The right posterior iliac crest was prepped in a sterile fashion.  Local anesthesia was achieved with 2% Xylocaine.  An attempt was made at the bone marrow biopsy but due to the patient's level of anxiety and cost and moving the procedure had to be aborted.  Therefore we were not able to obtain a core or aspirate.  She will be referred to interventional radiologist at the hospital to have the procedure done under CT guidance. Initially the procedure has been rescheduled to be done on 09/21/2012 at 8 AM at the hospital by the interventional radiologist.    Sharon Seller A. Alanda Slim, MD, Jerrel Ivory

## 2012-09-18 NOTE — Progress Notes (Signed)
Quick Note:    Labs reviewed and noted.  ______

## 2012-09-20 LAB — PROTEIN ELECTROPHORESIS
A/G ratio: 0.8 (ref 0.7–2.0)
ALPHA-2 GLOBULIN: 0.7 g/dL (ref 0.4–1.2)
Albumin: 3.5 g/dL (ref 3.2–5.6)
Alpha-1-globulin: 0.2 g/dL (ref 0.1–0.4)
Beta globulin: 1 g/dL (ref 0.6–1.3)
Gamma globulin: 2.3 g/dL — ABNORMAL HIGH (ref 0.5–1.6)
Globulin, total: 4.2 g/dL (ref 2.0–4.5)

## 2012-09-20 LAB — METABOLIC PANEL, COMPREHENSIVE
A-G Ratio: 1 — ABNORMAL LOW (ref 1.1–2.5)
ALT (SGPT): 10 IU/L (ref 0–32)
AST (SGOT): 12 IU/L (ref 0–40)
Albumin: 3.8 g/dL (ref 3.5–5.5)
Alk. phosphatase: 73 IU/L (ref 39–117)
BUN/Creatinine ratio: 20 (ref 9–23)
BUN: 13 mg/dL (ref 6–24)
Bilirubin, total: 0.2 mg/dL (ref 0.0–1.2)
CO2: 26 mmol/L (ref 19–28)
Calcium: 9 mg/dL (ref 8.7–10.2)
Chloride: 100 mmol/L (ref 97–108)
Creatinine: 0.66 mg/dL (ref 0.57–1.00)
GFR est AA: 123 mL/min/{1.73_m2} (ref >59)
GFR est non-AA: 107 mL/min/{1.73_m2} (ref >59)
GLOBULIN, TOTAL: 3.9 g/dL (ref 1.5–4.5)
Glucose: 87 mg/dL (ref 65–99)
Potassium: 4.3 mmol/L (ref 3.5–5.2)
Protein, total: 7.7 g/dL (ref 6.0–8.5)
Sodium: 139 mmol/L (ref 134–144)

## 2012-09-20 LAB — FERRITIN: Ferritin: 195 ng/mL — ABNORMAL HIGH (ref 15–150)

## 2012-09-20 LAB — HEPATIC FUNCTION PANEL: Bilirubin, direct: 0.05 mg/dL (ref 0.00–0.40)

## 2012-09-20 LAB — IRON PROFILE
Iron % saturation: 21 % (ref 15–55)
Iron: 50 ug/dL (ref 35–155)
TIBC: 236 ug/dL — ABNORMAL LOW (ref 250–450)
UIBC: 186 ug/dL (ref 150–375)

## 2012-09-20 LAB — SED RATE (ESR): Sed rate (ESR): 42 mm/hr — ABNORMAL HIGH (ref 0–32)

## 2012-09-21 ENCOUNTER — Encounter

## 2012-09-21 MED ORDER — SODIUM CHLORIDE 0.9 % IV
INTRAVENOUS | Status: DC
Start: 2012-09-21 — End: 2012-09-22

## 2012-09-21 MED FILL — SODIUM CHLORIDE 0.9 % IV: INTRAVENOUS | Qty: 1000

## 2012-09-21 NOTE — Progress Notes (Signed)
Patient had breakfast this AM therefore, BM had to be [facility] to 09/24/12 @ [facility] per [place] ([facility] Dept)  Patient appt for Dr Alanda Slim will be  [facility] to 10/02/12.  Called patient @ 1025am with new appt.

## 2012-09-21 NOTE — Progress Notes (Signed)
Quick Note:    Results reviewed and noted.  ______

## 2012-09-24 ENCOUNTER — Inpatient Hospital Stay: Payer: MEDICARE

## 2012-09-24 LAB — DRUG SCREEN, URINE
AMPHETAMINES: NEGATIVE
BARBITURATES: NEGATIVE
BENZODIAZEPINES: NEGATIVE
COCAINE: NEGATIVE
METHADONE: NEGATIVE
OPIATES: NEGATIVE
PCP(PHENCYCLIDINE): NEGATIVE
THC (TH-CANNABINOL): NEGATIVE

## 2012-09-24 LAB — CBC W/O DIFF
HCT: 37 % (ref 35.0–45.0)
HGB: 12.5 g/dL (ref 12.0–16.0)
MCH: 27.1 PG (ref 24.0–34.0)
MCHC: 33.8 g/dL (ref 31.0–37.0)
MCV: 80.3 FL (ref 74.0–97.0)
MPV: 10.2 FL (ref 9.2–11.8)
PLATELET: 289 10*3/uL (ref 135–420)
RBC: 4.61 M/uL (ref 4.20–5.30)
RDW: 16.7 % — ABNORMAL HIGH (ref 11.6–14.5)
WBC: 3.8 10*3/uL — ABNORMAL LOW (ref 4.6–13.2)

## 2012-09-24 LAB — DIFFERENTIAL, AUTO.
ABS. BASOPHILS: 0 10*3/uL (ref 0.0–0.06)
ABS. EOSINOPHILS: 0.2 10*3/uL (ref 0.0–0.4)
ABS. LYMPHOCYTES: 1.1 10*3/uL (ref 0.8–3.5)
ABS. MONOCYTES: 0.5 10*3/uL (ref 0–1.0)
ABS. NEUTROPHILS: 2 10*3/uL (ref 1.8–8.0)
BASOPHILS: 0 % (ref 0–3)
EOSINOPHILS: 4 % (ref 0–5)
LYMPHOCYTES: 28 % (ref 20–51)
MONOCYTES: 13 % — ABNORMAL HIGH (ref 2–9)
NEUTROPHILS: 55 % (ref 42–75)
PLATELET COMMENTS: ADEQUATE

## 2012-09-24 LAB — METABOLIC PANEL, BASIC
Anion gap: 7 mmol/L (ref 3.0–18)
BUN/Creatinine ratio: 10 — ABNORMAL LOW (ref 12–20)
BUN: 7 MG/DL (ref 7.0–18)
CO2: 25 MMOL/L (ref 21–32)
Calcium: 8.6 MG/DL (ref 8.5–10.1)
Chloride: 107 MMOL/L (ref 100–108)
Creatinine: 0.72 MG/DL (ref 0.6–1.3)
GFR est AA: >60 mL/min/{1.73_m2} (ref >60)
GFR est non-AA: >60 mL/min/{1.73_m2} (ref >60)
Glucose: 70 MG/DL — ABNORMAL LOW (ref 74–99)
Potassium: 4.2 MMOL/L (ref 3.5–5.5)
Sodium: 139 MMOL/L (ref 136–145)

## 2012-09-24 LAB — GLUCOSE, POC
Glucose (POC): 77 mg/dL (ref 70–110)
Glucose (POC): 68 mg/dL — ABNORMAL LOW (ref 70–110)

## 2012-09-24 LAB — PTT: aPTT: 32 s (ref 24.6–37.7)

## 2012-09-24 LAB — PROTHROMBIN TIME + INR
INR: 1 (ref 0.8–1.2)
Prothrombin time: 12.5 s (ref 11.5–15.2)

## 2012-09-24 MED ORDER — LIDOCAINE (PF) 10 MG/ML (1 %) IJ SOLN
10 mg/mL (1 %) | Freq: Once | INTRAMUSCULAR | Status: AC
Start: 2012-09-24 — End: 2012-09-24
  Administered 2012-09-24: 13:00:00 via SUBCUTANEOUS

## 2012-09-24 MED ORDER — OXYCODONE-ACETAMINOPHEN 5 MG-325 MG TAB
5-325 mg | ORAL_TABLET | ORAL | Status: DC
Start: 2012-09-24 — End: 2012-10-23

## 2012-09-24 MED ORDER — FAMOTIDINE 20 MG TAB
20 mg | Freq: Once | ORAL | Status: DC
Start: 2012-09-24 — End: 2012-09-24

## 2012-09-24 MED ORDER — SODIUM CHLORIDE 0.9 % IV
INTRAVENOUS | Status: DC
Start: 2012-09-24 — End: 2012-09-28

## 2012-09-24 MED ORDER — DOCUSATE SODIUM 50 MG CAP
50 mg | ORAL_CAPSULE | Freq: Two times a day (BID) | ORAL | Status: AC
Start: 2012-09-24 — End: 2012-12-23

## 2012-09-24 MED ORDER — SODIUM CHLORIDE 0.9 % IJ SYRG
INTRAMUSCULAR | Status: DC | PRN
Start: 2012-09-24 — End: 2012-09-24

## 2012-09-24 MED ORDER — MIDAZOLAM 1 MG/ML IJ SOLN
1 mg/mL | INTRAMUSCULAR | Status: DC | PRN
Start: 2012-09-24 — End: 2012-09-28
  Administered 2012-09-24 (×2): via INTRAVENOUS

## 2012-09-24 MED ORDER — NALOXONE 0.4 MG/ML INJECTION
0.4 mg/mL | INTRAMUSCULAR | Status: DC | PRN
Start: 2012-09-24 — End: 2012-09-28

## 2012-09-24 MED ORDER — FAMOTIDINE (PF) 20 MG/2 ML IV
20 mg/2 mL | INTRAVENOUS | Status: DC
Start: 2012-09-24 — End: 2012-09-24

## 2012-09-24 MED ORDER — SODIUM CHLORIDE 0.9 % IJ SYRG
Freq: Three times a day (TID) | INTRAMUSCULAR | Status: DC
Start: 2012-09-24 — End: 2012-09-24

## 2012-09-24 MED ORDER — HEPARIN (PORCINE) IN NS (PF) 1,000 UNIT/500 ML IV
1000 unit/500 mL | INTRAVENOUS | Status: DC | PRN
Start: 2012-09-24 — End: 2012-09-24
  Administered 2012-09-24: 15:00:00

## 2012-09-24 MED ORDER — SODIUM CHLORIDE 0.9 % INJECTION
20 mg/2 mL | Freq: Two times a day (BID) | INTRAMUSCULAR | Status: DC
Start: 2012-09-24 — End: 2012-09-24
  Administered 2012-09-24: 14:00:00 via INTRAVENOUS

## 2012-09-24 MED ORDER — LACTATED RINGERS IV
INTRAVENOUS | Status: DC
Start: 2012-09-24 — End: 2012-09-24

## 2012-09-24 MED ORDER — FENTANYL CITRATE (PF) 50 MCG/ML IJ SOLN
50 mcg/mL | INTRAMUSCULAR | Status: AC
Start: 2012-09-24 — End: ?

## 2012-09-24 MED ORDER — MIDAZOLAM 1 MG/ML IJ SOLN
1 mg/mL | INTRAMUSCULAR | Status: DC
Start: 2012-09-24 — End: 2012-09-24

## 2012-09-24 MED ORDER — BUPIVACAINE (PF) 0.25 % (2.5 MG/ML) IJ SOLN
0.25 % (2.5 mg/mL) | INTRAMUSCULAR | Status: AC
Start: 2012-09-24 — End: ?

## 2012-09-24 MED ORDER — FENTANYL CITRATE (PF) 50 MCG/ML IJ SOLN
50 mcg/mL | INTRAMUSCULAR | Status: DC
Start: 2012-09-24 — End: 2012-09-24

## 2012-09-24 MED ORDER — HEPARIN (PORCINE) IN NS (PF) 1,000 UNIT/500 ML IV
1000 unit/500 mL | INTRAVENOUS | Status: AC
Start: 2012-09-24 — End: ?

## 2012-09-24 MED ORDER — MIDAZOLAM 1 MG/ML IJ SOLN
1 mg/mL | INTRAMUSCULAR | Status: AC
Start: 2012-09-24 — End: ?

## 2012-09-24 MED ORDER — FLUMAZENIL 0.1 MG/ML IV SOLN
0.1 mg/mL | INTRAVENOUS | Status: DC | PRN
Start: 2012-09-24 — End: 2012-09-28

## 2012-09-24 MED ORDER — CEFAZOLIN 2 GM/50 ML IN DEXTROSE (ISO-OSMOTIC) IVPB
2 gram/50 mL | Freq: Once | INTRAVENOUS | Status: AC
Start: 2012-09-24 — End: 2012-09-24
  Administered 2012-09-24: 14:00:00 via INTRAVENOUS

## 2012-09-24 MED ORDER — PROMETHAZINE 25 MG/ML INJECTION
25 mg/mL | INTRAMUSCULAR | Status: DC | PRN
Start: 2012-09-24 — End: 2012-09-24

## 2012-09-24 MED ORDER — BUPIVACAINE 0.25 % (2.5 MG/ML) IJ SOLN
0.25 % (2.5 mg/mL) | INTRAMUSCULAR | Status: DC | PRN
Start: 2012-09-24 — End: 2012-09-24
  Administered 2012-09-24: 14:00:00 via SUBCUTANEOUS

## 2012-09-24 MED ORDER — IBUPROFEN 800 MG TAB
800 mg | ORAL_TABLET | Freq: Three times a day (TID) | ORAL | Status: DC | PRN
Start: 2012-09-24 — End: 2014-07-28

## 2012-09-24 MED ORDER — FENTANYL CITRATE (PF) 50 MCG/ML IJ SOLN
50 mcg/mL | INTRAMUSCULAR | Status: DC | PRN
Start: 2012-09-24 — End: 2012-09-28
  Administered 2012-09-24 (×3): via INTRAVENOUS

## 2012-09-24 MED ORDER — FENTANYL CITRATE (PF) 50 MCG/ML IJ SOLN
50 mcg/mL | INTRAMUSCULAR | Status: DC | PRN
Start: 2012-09-24 — End: 2012-09-24
  Administered 2012-09-24 (×2): via INTRAVENOUS

## 2012-09-24 MED FILL — FENTANYL CITRATE (PF) 50 MCG/ML IJ SOLN: 50 mcg/mL | INTRAMUSCULAR | Qty: 4

## 2012-09-24 MED FILL — CEFAZOLIN 2 GM/50 ML IN DEXTROSE (ISO-OSMOTIC) IVPB: 2 gram/50 mL | INTRAVENOUS | Qty: 50

## 2012-09-24 MED FILL — FAMOTIDINE 20 MG TAB: 20 mg | ORAL | Qty: 1

## 2012-09-24 MED FILL — LIDOCAINE (PF) 10 MG/ML (1 %) IJ SOLN: 10 mg/mL (1 %) | INTRAMUSCULAR | Qty: 30

## 2012-09-24 MED FILL — BD POSIFLUSH NORMAL SALINE 0.9 % INJECTION SYRINGE: INTRAMUSCULAR | Qty: 10

## 2012-09-24 MED FILL — MIDAZOLAM 1 MG/ML IJ SOLN: 1 mg/mL | INTRAMUSCULAR | Qty: 2

## 2012-09-24 MED FILL — SODIUM CHLORIDE 0.9 % IV: INTRAVENOUS | Qty: 1000

## 2012-09-24 MED FILL — HEPARIN (PORCINE) IN NS (PF) 1,000 UNIT/500 ML IV: 1000 unit/500 mL | INTRAVENOUS | Qty: 500

## 2012-09-24 MED FILL — BUPIVACAINE (PF) 0.25 % (2.5 MG/ML) IJ SOLN: 0.25 % (2.5 mg/mL) | INTRAMUSCULAR | Qty: 30

## 2012-09-24 MED FILL — FENTANYL CITRATE (PF) 50 MCG/ML IJ SOLN: 50 mcg/mL | INTRAMUSCULAR | Qty: 2

## 2012-09-24 MED FILL — MIDAZOLAM 1 MG/ML IJ SOLN: 1 mg/mL | INTRAMUSCULAR | Qty: 4

## 2012-09-24 MED FILL — LACTATED RINGERS IV: INTRAVENOUS | Qty: 1000

## 2012-09-24 MED FILL — FAMOTIDINE (PF) 20 MG/2 ML IV: 20 mg/2 mL | INTRAVENOUS | Qty: 2

## 2012-09-24 NOTE — Procedures (Signed)
RADIOLOGY POST PROCEDURE NOTE     September 24, 2012       9:19 AM     Preoperative Diagnosis:   Lymphoma    Postoperative Diagnosis:  Same.    Operator:  Dr. Larry Sierras    Assistant:  None.    Type of Anesthesia: 1% plain lidocaine and IV moderate sedation     Procedure/Description:  Bone marrow Bx    Findings:   See report.    Estimated blood Loss:  Minimal    Specimen Removed:   yes    Blood transfusions:  None.    Implants:  None.    Complications: None    Condition: Stable    Discharge Plan:  continue present therapy    Delia Heady, MD

## 2012-09-24 NOTE — Brief Op Note (Addendum)
BRIEF OPERATIVE NOTE    Date of Procedure: 09/24/2012   Preoperative Diagnosis: HODGKINS DISEASE OF HEAD/FACE/NECK  Postoperative Diagnosis: HODGKINS DISEASE OF HEAD/FACE/NECK    Procedure(s):  LEFT CEPHALIC VEIN MEDI-PORT PLACEMENT/C-ARM  Surgeon(s) and Role:     * Marylyn Ishihara, MD - Primary  Anesthesia: General   Estimated Blood Loss: 50 mL  Specimens: * No specimens in log *   Findings: both ports aspirate and flush with ease.     Complications: None.  Implants:   Implant Name Type Inv. Item Serial No. Manufacturer Lot No. LRB No. Used Action   PORT DL POWERPORT 7.8G MRI --  - [id number]   [id number]   BARD PERIPHERAL VASCULAR [id number] N/A 1 Implanted     Job ID:  [id number]

## 2012-09-24 NOTE — Other (Signed)
TRANSFER - OUT REPORT:    Verbal report given to  [facility] RN on Melissa Leblanc  being transferred to Pre-OP for routine post - op       Report consisted of patient???s Situation, Background, Assessment and   Recommendations(SBAR).     Information from the following report(s) SBAR, Kardex and MAR was reviewed with the receiving nurse.    Opportunity for questions and clarification was provided.

## 2012-09-24 NOTE — H&P (Signed)
OUTPATIENT HISTORY AND PHYSICAL      Today 09/24/2012     Indication/Symptoms:   Melissa Leblanc is a 47 y.o. female here for Bone marrow Bx. NHL.    Current Meds:    Prior to Admission medications    Medication Sig Start Date End Date Taking? Authorizing Provider   HYDROcodone-acetaminophen (NORCO) 5-325 mg per tablet Take 1-2 Tabs by mouth every six (6) hours as needed for Pain. 09/01/12   Marylyn Ishihara, MD   HYDROcodone-acetaminophen [facility]) 5-325 mg per tablet 1- 2 tablets every 4 -6 hours as needed for pain 09/01/12   Marylyn Ishihara, MD   ALPRAZolam [facility]) 0.5 mg tablet Take 1 Tab by mouth three (3) times daily as needed for Anxiety. 07/31/12   Steffanie Dunn, DO   LORazepam (ATIVAN) 0.5 mg tablet Take 1-2 Tabs by mouth every eight (8) hours as needed for Anxiety. 1/2 hour before procedure. 07/23/12   Steffanie Dunn, DO   PARoxetine (PAXIL) 20 mg tablet Take 1 Tab by mouth daily. 06/24/12   Steffanie Dunn, DO   lisinopril-hydrochlorothiazide (PRINZIDE, ZESTORETIC) 20-25 mg per tablet Take 1 Tab by mouth daily. 06/24/12   Steffanie Dunn, DO   solifenacin (VESICARE) 5 mg tablet Take 1 Tab by mouth daily. 02/07/12   Steffanie Dunn, DO   fluticasone (FLONASE) 50 mcg/actuation nasal spray 2 Sprays by Both Nostrils route daily. 02/07/12   Steffanie Dunn, DO   naproxen (NAPROSYN) 500 mg tablet Take 1 Tab by mouth two (2) times daily (with meals). 11/30/11   Rene Moncion, DO       Allergies:    No Known Allergies    Comorbid Conditions:    Past Medical History   Diagnosis Date   ??? Hypertension    ??? Prediabetes    ??? VAIN I (vaginal intraepithelial neoplasia grade I)    ??? Anxiety    ??? Lymphoma    ??? Neck mass    ??? Cancer      lymph node cancer   ??? Diabetes      border line diabetes   ??? GERD (gastroesophageal reflux disease)    ??? Asthma           Past Surgical History   Procedure Laterality Date   ??? Hx gyn       BTL   ??? Hx hysterectomy  2004   ??? Hx other surgical       lymph node biopsies neck     Data:    There were no  vitals taken for this visit.:  Recent Labs      09/24/12   0635   PLT  289     Recent Labs      09/24/12   0635   INR  1.0   APTT  32.0       The H & P and/or progress notes and any available imaging were reviewed.  The risks, indications and possible alternatives to the procedure, including doing nothing, were discussed and informed consent was obtained.    Physical Exam:      Mental status:   Alert and oriented.   Examination specific to the procedure proposed to be performed and any co morbid conditions:   Mallampati classification 2 ,  ASA2   Heart:   RRR.   Lungs:   CTAB.  No wheezes, rales or rhonchi.    The patient is an appropriate candidate to undergo the planned procedure  and sedation.    Berneice Heinrich, MD

## 2012-09-24 NOTE — Op Note (Signed)
[facility]                               [address]                          [place], [place] [address]                                 OPERATIVE REPORT    PATIENT:     Melissa Leblanc, Melissa Leblanc  MRN:            [id number]      DATE:   09/24/2012  BILLING:     [id number]  ROOM:        [id number]  ATTENDING:   Kaitlinn Iversen P. Hurley Sobel, MD  DICTATING:   Lotus Santillo P. Caralynn Gelber, MD      PREOPERATIVE DIAGNOSIS: Hodgkin lymphoma.    POSTOPERATIVE DIAGNOSIS: Hodgkin lymphoma.    PROCEDURES PERFORMED: Attempted left cephalic vein MediPort placement and  right cephalic vein MediPort placement.    SURGEON: Tucker Steedley P. Shannen Flansburg, MD    ASSISTANT: John Bernier    ANESTHESIA: General and local 0.25% Marcaine plain.    SPECIMENS REMOVED: None.    FINDINGS: Both ports aspirated and flushed with ease. Tip of catheter at  the right atrium.    ESTIMATED BLOOD LOSS: 50 mL.    FLUIDS GIVEN: 1 L.    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    DESCRIPTION OF PROCEDURE: The patient was identified in the holding area  with her family where consent for left cephalic vein MediPort placement was  verified. In the operating room, the patient was placed under general  anesthesia, both arms were tucked. The chest and neck were prepped and  draped in sterile fashion using chlorhexidine solution and sterile drapes.  The time-out was performed to ensure correct procedure. The local  anesthetic was infiltrated into the skin and deep dermal tissues in the  left deltopectoral groove. The 15 blade was used to create an incision  carried down through skin into subcutaneous tissue. Electrocautery was used  for hemostasis and dissection down to expose the cephalic vein which was  isolated between 3-0 silk suture. Venotomy was created. The hockey stick  was inserted. The catheter was inserted and under fluoroscopic guidance, I  guided around the subclavian, but it turned into  the internal jugular vein.  The standard guidewire that comes with the PowerPort was inserted and it  also, under fluoroscopic guidance, was seen to coil up toward the internal  jugular. The guidewire was then inserted and it also was seen to take a  track up toward the internal jugular and not across into the superior vena  cava. Venipuncture of the subclavian vein directly was performed and again  the wires would not pass across into the superior vena cava. A decision was  made to abort placement on the left and look at placement on the right. We  did try venipuncture into the jugular vein and due to the large lymph nodes  in this area, we were unable to get successful venipuncture. The wound was  irrigated and closed using interrupted 3-0 Vicryl suture and a running  subcuticular 4-0 Monocryl. Mastisol, Steri-Strips and sterile dressing were  applied. The right shoulder was exposed, it had already   been prepped. Local  anesthetic was infiltrated into the skin and deep dermal tissues in the  deltopectoral groove. The 15 blade was used to make an incision, which was  carried down through skin into subcutaneous tissue. The electrocautery was  used for hemostasis and dissection down to expose the cephalic vein. The  cephalic vein was then dissected free circumferentially and isolated  between 3-0 silk sutures. Venotomy was created using the 15 blade. The  hockey stick was inserted and the catheter was placed under fluoroscopic  guidance to 20 cm. The cephalic vein was then ligated between 3-0 silk  sutures. The pocket for the port was created. The port was attached to the  catheter which was cut at 26 cm. The catheter flushed with ease through the  port. Both lumens aspirated and flushed with ease. The port was secured  into the pocket using 2-0 Prolene suture. The wound was then irrigated  using saline solution. The port was accessed through the skin and again  aspirated and flushed with ease. The wound was closed  using 3-0 Vicryl  interrupted stitches of 4-0 Monocryl running subcuticular closure.  Mastisol, Steri-Strips and sterile dressing were applied. The patient  tolerated the procedure very well.    DISPOSITION: She was stable upon transport to the recovery room.                     Wanell Lorenzi P. Anurag Scarfo, MD    CPW:wmx  D: 09/24/2012 T: 09/24/2012 12:21 P  Job: [id number]  CScriptDoc #: [id number]  cc:   JASON M MCHUGH, DO        Petrea Fredenburg P. Tranell Wojtkiewicz, MD

## 2012-09-24 NOTE — Interval H&P Note (Signed)
H&P Update:  Melissa Leblanc was seen and examined.  History and physical has been reviewed. There have been no significant clinical changes since the completion of the originally dated History and Physical.    Signed By: Marylyn Ishihara, MD     September 24, 2012 8:22 AM
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
## 2012-09-24 NOTE — Procedures (Signed)
Procedures  by Chanda Busing, MD at 09/24/12 [phone]                Author: Chanda Busing, MD  Service: --  Author Type: Physician       Filed: 09/24/12 0919  Date of Service: 09/24/12 4782  Status: Signed          Editor: Chanda Busing, MD (Physician)            Procedure Orders        1. BONE MARROW ASPIRATION [[id number]] ordered by Chanda Busing, MD at 09/24/12 0753                                        RADIOLOGY POST PROCEDURE NOTE          September 24, 2012       9:19 AM        Preoperative Diagnosis:   Lymphoma      Postoperative Diagnosis:  Same.      Operator:  Dr. Larry Sierras      Assistant:  None.      Type of Anesthesia: 1% plain lidocaine and IV moderate sedation       Procedure/Description:  Bone marrow Bx      Findings:   See report.      Estimated blood Loss:  Minimal      Specimen Removed:   yes      Blood transfusions:  None.      Implants:  None.      Complications: None      Condition: Stable      Discharge Plan:  continue present therapy      Delia Heady, MD

## 2012-09-24 NOTE — Op Note (Signed)
[facility]                               [address] [place], [place] [address]                                 OPERATIVE REPORT    PATIENT:     Melissa Leblanc, Melissa Leblanc  MRN:            [id number]      DATE:   09/24/2012  BILLING:     [id number]  ROOM:        [id number]  ATTENDING:   Marylyn Ishihara, MD  DICTATING:   Marylyn Ishihara, MD      PREOPERATIVE DIAGNOSIS: Hodgkin lymphoma.    POSTOPERATIVE DIAGNOSIS: Hodgkin lymphoma.    PROCEDURES PERFORMED: Attempted left cephalic vein MediPort placement and  right cephalic vein MediPort placement.    SURGEON: Keygan Dumond P. Mayford Knife, MD    ASSISTANT: Kirstie Mirza    ANESTHESIA: General and local 0.25% Marcaine plain.    SPECIMENS REMOVED: None.    FINDINGS: Both ports aspirated and flushed with ease. Tip of catheter at  the right atrium.    ESTIMATED BLOOD LOSS: 50 mL.    FLUIDS GIVEN: 1 L.    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    DESCRIPTION OF PROCEDURE: The patient was identified in the holding area  with her family where consent for left cephalic vein MediPort placement was  verified. In the operating room, the patient was placed under general  anesthesia, both arms were tucked. The chest and neck were prepped and  draped in sterile fashion using chlorhexidine solution and sterile drapes.  The time-out was performed to ensure correct procedure. The local  anesthetic was infiltrated into the skin and deep dermal tissues in the  left deltopectoral groove. The 15 blade was used to create an incision  carried down through skin into subcutaneous tissue. Electrocautery was used  for hemostasis and dissection down to expose the cephalic vein which was  isolated between 3-0 silk suture. Venotomy was created. The hockey stick  was inserted. The catheter was inserted and under fluoroscopic guidance, I  guided around the subclavian, but it turned into  the internal jugular vein.  The standard guidewire that comes with the PowerPort was inserted and it  also, under fluoroscopic guidance, was seen to coil up toward the internal  jugular. The guidewire was then inserted and it also was seen to take a  track up toward the internal jugular and not across into the superior vena  cava. Venipuncture of the subclavian vein directly was performed and again  the wires would not pass across into the superior vena cava. A decision was  made to abort placement on the left and look at placement on the right. We  did try venipuncture into the jugular vein and due to the large lymph nodes  in this area, we were unable to get successful venipuncture. The wound was  irrigated and closed using interrupted 3-0 Vicryl suture and a running  subcuticular 4-0 Monocryl. Mastisol, Steri-Strips and sterile dressing were  applied. The right shoulder was exposed, it had already  been prepped. Local  anesthetic was infiltrated into the skin and deep dermal tissues in the  deltopectoral groove. The 15 blade was used to make an incision, which was  carried down through skin into subcutaneous tissue. The electrocautery was  used for hemostasis and dissection down to expose the cephalic vein. The  cephalic vein was then dissected free circumferentially and isolated  between 3-0 silk sutures. Venotomy was created using the 15 blade. The  hockey stick was inserted and the catheter was placed under fluoroscopic  guidance to 20 cm. The cephalic vein was then ligated between 3-0 silk  sutures. The pocket for the port was created. The port was attached to the  catheter which was cut at 26 cm. The catheter flushed with ease through the  port. Both lumens aspirated and flushed with ease. The port was secured  into the pocket using 2-0 Prolene suture. The wound was then irrigated  using saline solution. The port was accessed through the skin and again  aspirated and flushed with ease. The wound was closed  using 3-0 Vicryl  interrupted stitches of 4-0 Monocryl running subcuticular closure.  Mastisol, Steri-Strips and sterile dressing were applied. The patient  tolerated the procedure very well.    DISPOSITION: She was stable upon transport to the recovery room.                     Marylyn Ishihara, MD    CPW:wmx  D: 09/24/2012 T: 09/24/2012 12:21 P  Job: [id number]  CScriptDoc #: [id number]  cc:   Elissa Hefty MCHUGH, DO        Marylyn Ishihara, MD

## 2012-09-25 MED FILL — ONDANSETRON (PF) 4 MG/2 ML INJECTION: 4 mg/2 mL | INTRAMUSCULAR | Qty: 2

## 2012-09-25 MED FILL — GLYCOPYRROLATE 0.2 MG/ML IJ SOLN: 0.2 mg/mL | INTRAMUSCULAR | Qty: 2

## 2012-09-25 MED FILL — LACTATED RINGERS IV: INTRAVENOUS | Qty: 1000

## 2012-09-25 MED FILL — LIDOCAINE (PF) 20 MG/ML (2 %) IJ SOLN: 20 mg/mL (2 %) | INTRAMUSCULAR | Qty: 5

## 2012-09-25 MED FILL — DIPRIVAN 10 MG/ML INTRAVENOUS EMULSION: 10 mg/mL | INTRAVENOUS | Qty: 20

## 2012-09-25 NOTE — Progress Notes (Signed)
Quick Note:    Labs reviewed and noted.  ______

## 2012-09-29 NOTE — Addendum Note (Signed)
Addended by: Rosezena Sensor on: 09/29/2012 03:11 PM     Modules accepted: Orders

## 2012-09-30 NOTE — Telephone Encounter (Signed)
The patient had her mediport done on 09/24/2012. The patient wants an refill on percocet. I have asked the patient how many percocet was she taking that she  Is out already. The patient stated "2 at a time". She does not think the motrin is working. The patient does not have an post op appointment with [place] as of yet. The patient does have an appointment with Dr Alanda Slim on Friday. I have suggested to continue her motrin and to try tylenol 500 mg X2 to equal 1000 mg. She will try this to see if she gets any relief. The patient will ask Dr Alanda Slim for more pain medication at her appointment on Friday.

## 2012-10-02 MED ORDER — LIDOCAINE-PRILOCAINE 2.5 %-2.5 % TOPICAL CREAM
CUTANEOUS | Status: DC
Start: 2012-10-02 — End: 2013-07-06

## 2012-10-02 MED ORDER — PROCHLORPERAZINE MALEATE 10 MG TAB
10 mg | ORAL_TABLET | ORAL | Status: DC
Start: 2012-10-02 — End: 2015-04-13

## 2012-10-02 MED ORDER — WARFARIN 1 MG TAB
1 mg | ORAL_TABLET | ORAL | Status: DC
Start: 2012-10-02 — End: 2013-05-25

## 2012-10-02 MED ORDER — DIPHENHYDRAMINE 25 MG CAP
25 mg | ORAL_CAPSULE | ORAL | Status: DC
Start: 2012-10-02 — End: 2015-04-13

## 2012-10-02 NOTE — Progress Notes (Signed)
Hematology/medical oncology progress note    10/02/2012  Melissa Leblanc  Date of birth: [birth year]    PCP: Steffanie Dunn, MD    Diagnosis: Stage II Hodgkin's lymphoma    Melissa Leblanc has returned to the clinic to review the results of her recent bone marrow biopsy which was done as part of her staging evaluation for Hodgkin's lymphoma.  I have explained to the patient that her bone marrow reveals active mixed trilineage hematopoiesis with no evidence of Hodgkin's lymphoma involving the bone marrow.  She now has a power port in place and we will therefore plan to begin her systemic chemotherapy with the ABVD regimen next week.  I have discussed the risk, benefits, and potential side effects of the treatment regimen in detail.  Additional tests will include a MUGA scan which will be completed over the next 2-3 weeks.  Prescriptions for her premedications will be e-prescribed to her pharmacy. Additionally, I have explained to the patient that after she completes her systemic chemotherapy she will be referred to the radiation oncologist for consideration of involved field radiation therapy.  I will plan to see her back in the clinic in 4 weeks.  (I have reviewed the bone marrow biopsy results and finalized the treatment recommendations with the patient over 30 min.)    Jeffrie Stander A. Alanda Slim, MD

## 2012-10-07 MED ORDER — BLEOMYCIN 15 UNIT SOLUTION FOR INJECTION
15 unit | Freq: Once | INTRAMUSCULAR | Status: AC
Start: 2012-10-07 — End: 2012-10-08
  Administered 2012-10-08: 17:00:00 via INTRAVENOUS

## 2012-10-07 MED ORDER — VINBLASTINE 1 MG/ML IV
1 mg/mL | Freq: Once | INTRAVENOUS | Status: AC
Start: 2012-10-07 — End: 2012-10-08
  Administered 2012-10-08: 18:00:00 via INTRAVENOUS

## 2012-10-07 MED ORDER — DACARBAZINE 200 MG IV SOLUTION
200 mg | Freq: Once | INTRAVENOUS | Status: AC
Start: 2012-10-07 — End: 2012-10-08
  Administered 2012-10-08: 18:00:00 via INTRAVENOUS

## 2012-10-07 MED ORDER — DOXORUBICIN 10 MG/5 ML IV
10 mg/5 mL | Freq: Once | INTRAVENOUS | Status: AC
Start: 2012-10-07 — End: 2012-10-08
  Administered 2012-10-08: 16:00:00 via INTRAVENOUS

## 2012-10-07 MED ORDER — PALONOSETRON 0.25 MG/5 ML IV
0.25 mg/5 mL | Freq: Once | INTRAVENOUS | Status: AC
Start: 2012-10-07 — End: 2012-10-08
  Administered 2012-10-08: 15:00:00 via INTRAVENOUS

## 2012-10-07 MED ORDER — DIPHENHYDRAMINE HCL 50 MG/ML IJ SOLN
50 mg/mL | Freq: Once | INTRAMUSCULAR | Status: AC
Start: 2012-10-07 — End: 2012-10-08
  Administered 2012-10-08: 15:00:00 via INTRAVENOUS

## 2012-10-07 MED ORDER — SODIUM CHLORIDE 0.9 % IV
15 unit | Freq: Once | INTRAVENOUS | Status: AC
Start: 2012-10-07 — End: 2012-10-08
  Administered 2012-10-08: 16:00:00 via INTRAVENOUS

## 2012-10-07 MED ORDER — SODIUM CHLORIDE 0.9 % IV
4 mg/mL | Freq: Once | INTRAVENOUS | Status: AC
Start: 2012-10-07 — End: 2012-10-08
  Administered 2012-10-08: 15:00:00 via INTRAVENOUS

## 2012-10-08 ENCOUNTER — Encounter

## 2012-10-08 LAB — AMB POC COMPLETE CBC, AUTOMATED ONCOLOGY
ABS. GRANS (POC): 2.3 10*3/uL (ref 1.8–9.5)
ABS. GRANS (POC): 2.3 10*3/uL (ref 1.8–9.5)
HCT (POC): 37.2 % (ref 36–48)
HCT (POC): 37.2 % (ref 36–48)
HGB (POC): 11.1 g/dL — AB (ref 12–16)
HGB (POC): 11.1 g/dL — AB (ref 12–16)
LYMPHOCYTES (POC): 29.6 % (ref 24–44)
LYMPHOCYTES (POC): 29.6 % (ref 24–44)
PLATELET (POC): 273 10*3/uL (ref 140–440)
PLATELET (POC): 273 10*3/uL (ref 140–440)
WBC (POC): 3.8 10*3/uL — AB (ref 4.5–13)
WBC (POC): 3.8 10*3/uL — AB (ref 4.5–13)

## 2012-10-08 MED ORDER — SODIUM CHLORIDE 0.9 % IV
INTRAVENOUS | Status: DC
Start: 2012-10-08 — End: 2012-10-12
  Administered 2012-10-08: 15:00:00 via INTRAVENOUS

## 2012-10-08 MED ORDER — SODIUM CHLORIDE 0.9 % IJ SYRG
INTRAMUSCULAR | Status: DC | PRN
Start: 2012-10-08 — End: 2012-10-12
  Administered 2012-10-08 (×2): via INTRAVENOUS

## 2012-10-08 MED ORDER — HEPARIN LOCK FLUSH 100 UNIT/ML IV SOLN
100 unit/mL | INTRAVENOUS | Status: AC
Start: 2012-10-08 — End: 2012-10-08
  Administered 2012-10-08: 19:00:00 via INTRAVENOUS

## 2012-10-08 MED ORDER — HEPARIN LOCK FLUSH 100 UNIT/ML IV SOLN
100 unit/mL | INTRAVENOUS | Status: DC | PRN
Start: 2012-10-08 — End: 2012-10-12

## 2012-10-08 NOTE — Progress Notes (Signed)
[facility] Progress Note    Date: October 08, 2012    Name: Melissa Leblanc              MRN: [id number]              DOB: Nov 08, 1965    [name] was assessed and education was provided.     [name] vitals were reviewed.  Visit Vitals   Item Reading   ??? BP 117/76   ??? Pulse 80   ??? Temp 99.6 ??F (37.6 ??C)   ??? Resp 16   ??? Ht 5\' 5"  (1.651 m)   ??? Wt 73.483 kg (162 lb)   ??? BMI 26.96 kg/m2       Lab results were obtained and reviewed.  Recent Results (from the past 12 hour(s))   AMB POC COMPLETE CBC, AUTOMATED ONCOLOGY    Collection Time     10/08/12 10:20 AM       Result Value Range    HGB (POC) 11.1 (*) 12 - 16 g/dL    HCT (POC) 40.9  36 - 48 %    WBC (POC) 3.8 (*) 4.5 - 13 K/uL    PLATELET (POC) 273  140 - 440 K/uL    LYMPHOCYTES (POC) 29.6%  24 - 44 %    ABS. GRANS (POC) 2.3  1.8 - 9.5 K/uL         Chemotherapy Cycle: Cycle 1, Day 1     Pre-medications were administered as ordered and chemotherapy given as ordered.     [name] tolerated infusion, and had no complaints at this time.    [facility] condition at 1425. She is to return on 10/22/12 at 1100 for her next appointment for Chemo cycle 1, Day 15.    Fransico Setters, RN  October 08, 2012  2:38 PM

## 2012-10-09 NOTE — Progress Notes (Signed)
Quick Note:    Results reviewed and noted.  ______

## 2012-10-15 MED ORDER — F-18 FLUORODEOXYGLUCOSE
Freq: Once | Status: AC
Start: 2012-10-15 — End: 2012-10-15
  Administered 2012-10-15: 16:00:00 via INTRAVENOUS

## 2012-10-15 MED FILL — F-18 FLUORODEOXYGLUCOSE: Qty: 14

## 2012-10-21 MED ORDER — PALONOSETRON 0.25 MG/5 ML IV
0.25 mg/5 mL | Freq: Once | INTRAVENOUS | Status: AC
Start: 2012-10-21 — End: 2012-10-22
  Administered 2012-10-22: 17:00:00 via INTRAVENOUS

## 2012-10-21 MED ORDER — SODIUM CHLORIDE 0.9 % IV
200 mg | Freq: Once | INTRAVENOUS | Status: AC
Start: 2012-10-21 — End: 2012-10-22
  Administered 2012-10-22: 19:00:00 via INTRAVENOUS

## 2012-10-21 MED ORDER — BLEOMYCIN 15 UNIT SOLUTION FOR INJECTION
15 unit | Freq: Once | INTRAMUSCULAR | Status: AC
Start: 2012-10-21 — End: 2012-10-22
  Administered 2012-10-22: 18:00:00 via INTRAVENOUS

## 2012-10-21 MED ORDER — SODIUM CHLORIDE 0.9 % IV
105 mg/5 mL | Freq: Once | INTRAVENOUS | Status: AC
Start: 2012-10-21 — End: 2012-10-22
  Administered 2012-10-22: 17:00:00 via INTRAVENOUS

## 2012-10-21 MED ORDER — DEXAMETHASONE SODIUM PHOSPHATE 4 MG/ML IJ SOLN
4 mg/mL | Freq: Once | INTRAMUSCULAR | Status: AC
Start: 2012-10-21 — End: 2012-10-22
  Administered 2012-10-22: 17:00:00 via INTRAVENOUS

## 2012-10-21 MED ORDER — DIPHENHYDRAMINE HCL 50 MG/ML IJ SOLN
50 mg/mL | Freq: Once | INTRAMUSCULAR | Status: AC
Start: 2012-10-21 — End: 2012-10-22
  Administered 2012-10-22: 17:00:00 via INTRAVENOUS

## 2012-10-21 MED ORDER — VINBLASTINE 1 MG/ML IV
1 mg/mL | Freq: Once | INTRAVENOUS | Status: AC
Start: 2012-10-21 — End: 2012-10-22
  Administered 2012-10-22: 19:00:00 via INTRAVENOUS

## 2012-10-21 MED FILL — DEXAMETHASONE SODIUM PHOSPHATE 4 MG/ML IJ SOLN: 4 mg/mL | INTRAMUSCULAR | Qty: 5

## 2012-10-21 MED FILL — BLEOMYCIN 15 UNIT SOLUTION FOR INJECTION: 15 unit | INTRAMUSCULAR | Qty: 18

## 2012-10-21 MED FILL — DACARBAZINE 200 MG IV SOLUTION: 200 mg | INTRAVENOUS | Qty: 679

## 2012-10-21 MED FILL — VINBLASTINE 1 MG/ML IV: 1 mg/mL | INTRAVENOUS | Qty: 11

## 2012-10-21 MED FILL — DOXORUBICIN 10 MG/5 ML IV: 10 mg/5 mL | INTRAVENOUS | Qty: 22.5

## 2012-10-22 LAB — AMB POC COMPLETE CBC, AUTOMATED ONCOLOGY
ABS. GRANS (POC): 1 10*3/uL — AB (ref 1.8–9.5)
HCT (POC): 35.1 % — AB (ref 36–48)
HGB (POC): 10.8 g/dL — AB (ref 12–16)
LYMPHOCYTES (POC): 43.5 % (ref 24–44)
PLATELET (POC): 253 10*3/uL (ref 140–440)
WBC (POC): 2.1 10*3/uL — AB (ref 4.5–13)

## 2012-10-22 MED ORDER — HEPARIN LOCK FLUSH 100 UNIT/ML IV SOLN
100 unit/mL | INTRAVENOUS | Status: DC | PRN
Start: 2012-10-22 — End: 2012-10-26
  Administered 2012-10-22: 20:00:00 via INTRAVENOUS

## 2012-10-22 MED ORDER — PEGFILGRASTIM 6 MG/0.6ML SC SYRG
6 mg/0. mL | Freq: Once | SUBCUTANEOUS | Status: AC
Start: 2012-10-22 — End: 2012-10-23
  Administered 2012-10-23: 21:00:00 via SUBCUTANEOUS

## 2012-10-22 MED ORDER — SODIUM CHLORIDE 0.9 % IJ SYRG
INTRAMUSCULAR | Status: DC | PRN
Start: 2012-10-22 — End: 2012-10-26
  Administered 2012-10-22 (×2): via INTRAVENOUS

## 2012-10-22 MED ORDER — SODIUM CHLORIDE 0.9 % IV
INTRAVENOUS | Status: DC
Start: 2012-10-22 — End: 2012-10-26
  Administered 2012-10-22: 16:00:00 via INTRAVENOUS

## 2012-10-22 NOTE — Progress Notes (Addendum)
[facility] Progress Note    Date: October 22, 2012    Name: Melissa Leblanc              MRN: [id number]              DOB: [birth year]    Ms. [name] was assessed and education was provided.     Ms. [name]'s vitals were reviewed.  Visit Vitals   Item Reading   ??? BP 117/76   ??? Pulse 87   ??? Temp 99.9 ??F (37.7 ??C)   ??? Resp 16   ??? Ht 5\' 5"  (1.651 m)   ??? Wt 75.297 kg (166 lb)   ??? BMI 27.62 kg/m2       Lab results were obtained and reviewed.  No results found for this or any previous visit (from the past 12 hour(s)).      Chemotherapy Cycle: C1 D15 of 6 cycles.     Pre-medications were administered as ordered except for Tylenol because Pt had Tylenol containing med at 0800 this am at home.  Chemo given as ordered.     Ms. [name] tolerated infusion and had no complaints at this time.   Ms. [name] was [facility] in stable condition at 1524. She is to return on 10/23/12 at 1600 for her next appointment for Neulasta injection.     Fransico Setters, RN  October 22, 2012

## 2012-10-23 MED ORDER — OXYCODONE-ACETAMINOPHEN 5 MG-325 MG TAB
5-325 mg | ORAL_TABLET | ORAL | Status: DC
Start: 2012-10-23 — End: 2012-10-30

## 2012-10-23 NOTE — Progress Notes (Signed)
[facility] Progress Note    Date: October 23, 2012    Name: Ellyse Rotolo    MRN: [id number]         DOB: 11/26/1965      Ms. Bau was assessed and education was provided.     Ms. Rambert's vitals were reviewed.  Visit Vitals   Item Reading   ??? BP 144/91   ??? Pulse 66   ??? Temp 99.8 ??F (37.7 ??C)   ??? Resp 16     Neulasta given as ordered.    Ms. Broadfoot was [facility] in stable condition at 1602. She is to return on 11/05/12 at 1100 for her next appointment.    Fransico Setters, RN  October 23, 2012  4:06 PM

## 2012-10-23 NOTE — Progress Notes (Signed)
Quick Note:    Results reviewed and noted.  ______

## 2012-10-30 LAB — AMB POC COMPLETE CBC, AUTOMATED ONCOLOGY
ABS. GRANS (POC): 10.6 10*3/uL — AB (ref 1.8–9.5)
HCT (POC): 36.8 % (ref 36–48)
HGB (POC): 11.2 g/dL — AB (ref 12–16)
LYMPHOCYTES (POC): 18.9 % — AB (ref 24–44)
PLATELET (POC): 152 10*3/uL (ref 140–440)
WBC (POC): 14.9 10*3/uL — AB (ref 4.5–13)

## 2012-10-30 MED ORDER — FERROUS SULFATE 325 MG (65 MG ELEMENTAL IRON) TAB
325 mg (65 mg iron) | ORAL_TABLET | Freq: Every day | ORAL | Status: DC
Start: 2012-10-30 — End: 2013-09-29

## 2012-10-30 MED ORDER — OXYCODONE-ACETAMINOPHEN 5 MG-325 MG TAB
5-325 mg | ORAL_TABLET | ORAL | Status: DC
Start: 2012-10-30 — End: 2012-11-13

## 2012-10-30 NOTE — Progress Notes (Signed)
Quick Note:    Labs reviewed and noted.  ______
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
## 2012-10-31 LAB — METABOLIC PANEL, COMPREHENSIVE
A-G Ratio: 1.3 (ref 1.1–2.5)
ALT (SGPT): 11 IU/L (ref 0–32)
AST (SGOT): 15 IU/L (ref 0–40)
Albumin: 4.1 g/dL (ref 3.5–5.5)
Alk. phosphatase: 117 IU/L (ref 39–117)
BUN/Creatinine ratio: 8 — ABNORMAL LOW (ref 9–23)
BUN: 5 mg/dL — ABNORMAL LOW (ref 6–24)
Bilirubin, total: 0.1 mg/dL (ref 0.0–1.2)
CO2: 23 mmol/L (ref 19–28)
Calcium: 9.4 mg/dL (ref 8.7–10.2)
Chloride: 102 mmol/L (ref 97–108)
Creatinine: 0.65 mg/dL (ref 0.57–1.00)
GFR est AA: 124 mL/min/{1.73_m2} (ref >59)
GFR est non-AA: 108 mL/min/{1.73_m2} (ref >59)
GLOBULIN, TOTAL: 3.1 g/dL (ref 1.5–4.5)
Glucose: 76 mg/dL (ref 65–99)
Potassium: 4 mmol/L (ref 3.5–5.2)
Protein, total: 7.2 g/dL (ref 6.0–8.5)
Sodium: 140 mmol/L (ref 134–144)

## 2012-10-31 LAB — IRON PROFILE
Iron % saturation: 24 % (ref 15–55)
Iron: 64 ug/dL (ref 35–155)
TIBC: 269 ug/dL (ref 250–450)
UIBC: 205 ug/dL (ref 150–375)

## 2012-10-31 LAB — SED RATE (ESR): Sed rate (ESR): 38 mm/hr — ABNORMAL HIGH (ref 0–32)

## 2012-10-31 LAB — FERRITIN: Ferritin: 480 ng/mL — ABNORMAL HIGH (ref 15–150)

## 2012-11-02 NOTE — Progress Notes (Signed)
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  ______

## 2012-11-03 NOTE — Progress Notes (Signed)
Quick Note:    Pt currently on treatment with ABVD.  ______

## 2012-11-03 NOTE — Progress Notes (Signed)
Quick Note:    Results reviewed and noted.  ______

## 2012-11-04 MED ORDER — PALONOSETRON 0.25 MG/5 ML IV
0.25 mg/5 mL | Freq: Once | INTRAVENOUS | Status: AC
Start: 2012-11-04 — End: 2012-11-05
  Administered 2012-11-05: 16:00:00 via INTRAVENOUS

## 2012-11-04 MED ORDER — SODIUM CHLORIDE 0.9 % IV
1 mg/mL | Freq: Once | INTRAVENOUS | Status: AC
Start: 2012-11-04 — End: 2012-11-05
  Administered 2012-11-05: 18:00:00 via INTRAVENOUS

## 2012-11-04 MED ORDER — DEXAMETHASONE SODIUM PHOSPHATE 4 MG/ML IJ SOLN
4 mg/mL | Freq: Once | INTRAMUSCULAR | Status: AC
Start: 2012-11-04 — End: 2012-11-05
  Administered 2012-11-05: 17:00:00 via INTRAVENOUS

## 2012-11-04 MED ORDER — SODIUM CHLORIDE 0.9 % IV
15 unit | Freq: Once | INTRAVENOUS | Status: AC
Start: 2012-11-04 — End: 2012-11-05
  Administered 2012-11-05: 17:00:00 via INTRAVENOUS

## 2012-11-04 MED ORDER — DOXORUBICIN 10 MG/5 ML IV
105 mg/5 mL | Freq: Once | INTRAVENOUS | Status: AC
Start: 2012-11-04 — End: 2012-11-05
  Administered 2012-11-05: 17:00:00 via INTRAVENOUS

## 2012-11-04 MED ORDER — DIPHENHYDRAMINE HCL 50 MG/ML IJ SOLN
50 mg/mL | Freq: Once | INTRAMUSCULAR | Status: AC
Start: 2012-11-04 — End: 2012-11-05
  Administered 2012-11-05: 16:00:00 via INTRAVENOUS

## 2012-11-04 MED ORDER — DACARBAZINE 200 MG IV SOLUTION
200 mg | Freq: Once | INTRAVENOUS | Status: AC
Start: 2012-11-04 — End: 2012-11-05
  Administered 2012-11-05: 19:00:00 via INTRAVENOUS

## 2012-11-05 ENCOUNTER — Encounter

## 2012-11-05 LAB — AMB POC COMPLETE CBC, AUTOMATED ONCOLOGY
ABS. GRANS (POC): 3.1 10*3/uL (ref 1.8–9.5)
HCT (POC): 37.9 % (ref 36–48)
HGB (POC): 11 g/dL — AB (ref 12–16)
LYMPHOCYTES (POC): 32 % (ref 24–44)
PLATELET (POC): 165 10*3/uL (ref 140–440)
WBC (POC): 5.2 10*3/uL (ref 4.5–13)

## 2012-11-05 MED ORDER — HEPARIN LOCK FLUSH 100 UNIT/ML IV SOLN
100 unit/mL | INTRAVENOUS | Status: DC | PRN
Start: 2012-11-05 — End: 2012-11-09
  Administered 2012-11-05: 20:00:00 via INTRAVENOUS

## 2012-11-05 MED ORDER — SODIUM CHLORIDE 0.9 % IJ SYRG
INTRAMUSCULAR | Status: DC | PRN
Start: 2012-11-05 — End: 2012-11-09
  Administered 2012-11-05 (×2): via INTRAVENOUS

## 2012-11-05 MED ORDER — ALLOPURINOL 300 MG TAB
300 mg | ORAL_TABLET | Freq: Every day | ORAL | Status: DC
Start: 2012-11-05 — End: 2013-02-28

## 2012-11-05 MED ORDER — SODIUM CHLORIDE 0.9 % IV
INTRAVENOUS | Status: DC
Start: 2012-11-05 — End: 2012-11-09
  Administered 2012-11-05: 16:00:00 via INTRAVENOUS

## 2012-11-05 NOTE — Progress Notes (Signed)
[facility] Progress Note    Date: November 05, 2012    Name: Melissa Leblanc              MRN: [id number]              DOB: Jan 16, 1966    Ms. [name] was assessed and education was provided.     Ms. [name]'s vitals were reviewed.  Visit Vitals   Item Reading   ??? BP 120/79   ??? Pulse 95   ??? Temp 97.5 ??F (36.4 ??C)   ??? Resp 16   ??? Ht 5\' 5"  (1.651 m)   ??? Wt 75.297 kg (166 lb)   ??? BMI 27.62 kg/m2       Lab results were obtained and reviewed.  Chemotherapy Cycle: 2, Day 1     Pre-medications were administered as ordered and chemotherapy was given as ordered.     Ms. [name] tolerated infusion, and had no complaints at this time.  Patient armband removed and shredded.    [facility] [name] [facility] at 1545. She is to return on 11/19/12 at 0900 for her next appointment for chemo ABVD Cycle 2, Day 15.    Fransico Setters, RN  November 05, 2012

## 2012-11-05 NOTE — Progress Notes (Signed)
Quick Note:    Labs reviewed and noted.  ______

## 2012-11-05 NOTE — Progress Notes (Signed)
Patient seen in [facility] today with complaints of pain and swelling in joint of right big toe. There is warmth. No swelling of legs. No injury or trauma. Will also check Uric acid level as she is s/p 1 cycle of ABVD for Hodgkins Lymphoma and suspect possible high uric acid from tumor lysis. Will start allopurinol 300 mg daily x 14 days. Encouraged to use warm compress and current pain medication. Call with worsening symptoms.

## 2012-11-13 ENCOUNTER — Encounter

## 2012-11-13 MED ORDER — OXYCODONE-ACETAMINOPHEN 5 MG-325 MG TAB
5-325 mg | ORAL_TABLET | ORAL | Status: DC
Start: 2012-11-13 — End: 2012-11-27

## 2012-11-18 MED ORDER — BLEOMYCIN 15 UNIT SOLUTION FOR INJECTION
15 unit | Freq: Once | INTRAMUSCULAR | Status: AC
Start: 2012-11-18 — End: 2012-11-19
  Administered 2012-11-19: 16:00:00 via INTRAVENOUS

## 2012-11-18 MED ORDER — SODIUM CHLORIDE 0.9 % IV
4 mg/mL | Freq: Once | INTRAVENOUS | Status: AC
Start: 2012-11-18 — End: 2012-11-19
  Administered 2012-11-19: 15:00:00 via INTRAVENOUS

## 2012-11-18 MED ORDER — DOXORUBICIN 10 MG/5 ML IV
105 mg/5 mL | Freq: Once | INTRAVENOUS | Status: AC
Start: 2012-11-18 — End: 2012-11-19
  Administered 2012-11-19: 15:00:00 via INTRAVENOUS

## 2012-11-18 MED ORDER — DIPHENHYDRAMINE HCL 50 MG/ML IJ SOLN
50 mg/mL | Freq: Once | INTRAMUSCULAR | Status: AC
Start: 2012-11-18 — End: 2012-11-19
  Administered 2012-11-19: 14:00:00 via INTRAVENOUS

## 2012-11-18 MED ORDER — PALONOSETRON 0.25 MG/5 ML IV
0.25 mg/5 mL | Freq: Once | INTRAVENOUS | Status: AC
Start: 2012-11-18 — End: 2012-11-19
  Administered 2012-11-19: 14:00:00 via INTRAVENOUS

## 2012-11-18 MED ORDER — DACARBAZINE 200 MG IV SOLUTION
200 mg | Freq: Once | INTRAVENOUS | Status: AC
Start: 2012-11-18 — End: 2012-11-19
  Administered 2012-11-19: 16:00:00 via INTRAVENOUS

## 2012-11-18 MED ORDER — SODIUM CHLORIDE 0.9 % IV
1 mg/mL | Freq: Once | INTRAVENOUS | Status: AC
Start: 2012-11-18 — End: 2012-11-19
  Administered 2012-11-19: 16:00:00 via INTRAVENOUS

## 2012-11-19 LAB — AMB POC COMPLETE CBC, AUTOMATED ONCOLOGY
ABS. GRANS (POC): 2 10*3/uL (ref 1.8–9.5)
HCT (POC): 37 % (ref 36–48)
HGB (POC): 11.6 g/dL — AB (ref 12–16)
LYMPHOCYTES (POC): 39 % (ref 24–44)
PLATELET (POC): 230 10*3/uL (ref 140–440)
WBC (POC): 3.8 10*3/uL — AB (ref 4.5–13)

## 2012-11-19 MED ORDER — SODIUM CHLORIDE 0.9 % IV
INTRAVENOUS | Status: DC
Start: 2012-11-19 — End: 2012-11-23
  Administered 2012-11-19: 14:00:00 via INTRAVENOUS

## 2012-11-19 MED ORDER — SODIUM CHLORIDE 0.9 % IJ SYRG
INTRAMUSCULAR | Status: DC | PRN
Start: 2012-11-19 — End: 2012-11-23
  Administered 2012-11-19: 18:00:00 via INTRAVENOUS

## 2012-11-19 MED ORDER — HEPARIN LOCK FLUSH 100 UNIT/ML IV SOLN
100 unit/mL | INTRAVENOUS | Status: DC | PRN
Start: 2012-11-19 — End: 2012-11-23
  Administered 2012-11-19: 18:00:00 via INTRAVENOUS

## 2012-11-19 MED ORDER — PEGFILGRASTIM 6 MG/0.6ML SC SYRG
6 mg/0. mL | Freq: Once | SUBCUTANEOUS | Status: AC
Start: 2012-11-19 — End: 2012-11-20
  Administered 2012-11-20: 15:00:00 via SUBCUTANEOUS

## 2012-11-19 NOTE — Progress Notes (Signed)
Quick Note:    Labs reviewed and noted.  ______
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
## 2012-11-19 NOTE — Progress Notes (Signed)
[facility] Progress Note    Date: November 19, 2012    Name: Melissa Leblanc              MRN: [id number]              DOB: [birth year]    Melissa Leblanc was assessed and education was provided.     Melissa Leblanc's vitals were reviewed.  Visit Vitals   Item Reading   ??? BP 128/79   ??? Pulse 89   ??? Temp 98.7 ??F (37.1 ??C)   ??? Ht 5\' 5"  (1.651 m)   ??? Wt 75.297 kg (166 lb)   ??? BMI 27.62 kg/m2       Lab results were obtained and reviewed.  Recent Results (from the past 12 hour(s))   AMB POC COMPLETE CBC, AUTOMATED ONCOLOGY    Collection Time     11/19/12 10:24 AM       Result Value Range    HGB (POC) 11.6 (*) 12 - 16 g/dL    HCT (POC) 40.9  36 - 48 %    WBC (POC) 3.8 (*) 4.5 - 13 K/uL    PLATELET (POC) 230  140 - 440 K/uL    LYMPHOCYTES (POC) 39.0%  24 - 44 %    ABS. GRANS (POC) 2.0  1.8 - 9.5 K/uL       Chemotherapy Cycle: C2, D15.     Pre-medications were administered as ordered and chemotherapy was given as ordered.  Melissa Leblanc tolerated infusion, and had no complaints at this time.    Melissa Leblanc [facility] in [facility] condition at 1355. She is to return on 11/20/12 at 1030 for her next appointment for Neulasta injection.    Fransico Setters, RN  November 19, 2012

## 2012-11-20 MED ORDER — AMOXICILLIN-CLAVULANATE 500 MG-125 MG TAB
500-125 mg | ORAL_TABLET | Freq: Three times a day (TID) | ORAL | Status: AC
Start: 2012-11-20 — End: 2012-11-30

## 2012-11-20 NOTE — Progress Notes (Signed)
Hematology/Oncology  Progress Note    Name: Melissa Leblanc  Date: 11/20/2012  DOB: [birth year]    RUE:AVWUJ Francene Boyers, MD    Melissa Leblanc is a 47 year old female who was seen for management of her stage IIB nodular sclerosing Hodgkin's disease.  Current therapy: ABVD , cycle 2, day 15, completed    Subjective:     The patient is here in the clinic today reporting that she is experiencing a considerable degree of fatigue and weakness from her chemotherapy.  She did have a mild degree of nausea but this was controlled reasonably well with the anti-emetic medication.  She reports that the night sweats and unexplained fevers have significantly diminished since starting therapy. She does have a pelvic abscess which ruptured a couple days ago.  The pain has subsided from this area at this time.  She has not experienced any fever or chills.  She continues to have some purulent drainage from the ruptured abscess. There are no other current complaints or concerns.    Past Medical History   Diagnosis Date   ??? Hypertension    ??? Prediabetes    ??? VAIN I (vaginal intraepithelial neoplasia grade I)    ??? Anxiety    ??? Lymphoma    ??? Neck mass    ??? Cancer      lymph node cancer   ??? Diabetes      border line diabetes   ??? GERD (gastroesophageal reflux disease)    ??? Asthma      Past Surgical History   Procedure Laterality Date   ??? Hx gyn       BTL   ??? Hx hysterectomy  2004   ??? Hx other surgical       lymph node biopsies neck     History     Social History   ??? Marital Status: SINGLE     Spouse Name: N/A     Number of Children: N/A   ??? Years of Education: N/A     Occupational History   ??? Not on file.     Social History Main Topics   ??? Smoking status: Current Every Day Smoker -- 0.25 packs/day for 25 years   ??? Smokeless tobacco: Never Used   ??? Alcohol Use: 3.0 oz/week     6 Cans of beer per week      Comment: every 3 to 4 days   ??? Drug Use: Yes     Special: Marijuana      Comment: one or 2 puffs occasionally   ??? Sexually Active: No     Other Topics  Concern   ??? Not on file     Social History Narrative   ??? No narrative on file     Family History   Problem Relation Age of Onset   ??? Cancer Mother    ??? Breast Cancer Mother    ??? Cancer Father    ??? Alcohol abuse Neg Hx    ??? Arthritis-rheumatoid Neg Hx    ??? Asthma Neg Hx    ??? Bleeding Prob Neg Hx    ??? Diabetes Neg Hx    ??? Elevated Lipids Neg Hx    ??? Headache Neg Hx    ??? Heart Disease Neg Hx    ??? Hypertension Neg Hx    ??? Lung Disease Neg Hx    ??? Migraines Neg Hx    ??? Psychiatric Disorder Neg Hx    ??? Stroke Neg Hx    ???  Mental Retardation Neg Hx      Current Outpatient Prescriptions   Medication Sig Dispense Refill   ??? oxyCODONE-acetaminophen (PERCOCET) 5-325 mg per tablet 1-2 tablets every 4-6 hours prn pain  60 Tab  0   ??? allopurinol (ZYLOPRIM) 300 mg tablet Take 1 Tab by mouth daily.  14 Tab  0   ??? ferrous sulfate 325 mg (65 mg iron) tablet Take 1 Tab by mouth Daily (before breakfast).  30 Tab  3   ??? lidocaine-prilocaine (EMLA) topical cream Apply to mediport 1 hour prior to chemo  30 g  0   ??? warfarin (COUMADIN) 1 mg tablet Take 1 tab po qd at 6pm or after  30 Tab  6   ??? prochlorperazine (COMPAZINE) 10 mg tablet Take 1 tab po Q 6 hours with Benadryl 25mg  for nausea  60 Tab  2   ??? diphenhydrAMINE (BENADRYL) 25 mg capsule Take 1 tab po Q 6 hours with compazine for nausea  30 Cap  2   ??? ibuprofen (MOTRIN) 800 mg tablet Take 1 Tab by mouth three (3) times daily as needed for Pain.  40 Tab  0   ??? docusate sodium (COLACE) 50 mg capsule Take 2 Caps by mouth two (2) times a day for 90 days.  60 Cap  2   ??? HYDROcodone-acetaminophen (NORCO) 5-325 mg per tablet Take 1-2 Tabs by mouth every six (6) hours as needed for Pain.  40 Tab  0   ??? HYDROcodone-acetaminophen (NORCO) 5-325 mg per tablet 1- 2 tablets every 4 -6 hours as needed for pain  30 Tab  o   ??? ALPRAZolam (XANAX) 0.5 mg tablet Take 1 Tab by mouth three (3) times daily as needed for Anxiety.  30 Tab  0   ??? LORazepam (ATIVAN) 0.5 mg tablet Take 1-2 Tabs by mouth every  eight (8) hours as needed for Anxiety. 1/2 hour before procedure.  4 Tab  0   ??? PARoxetine (PAXIL) 20 mg tablet Take 1 Tab by mouth daily.  90 Tab  1   ??? lisinopril-hydrochlorothiazide (PRINZIDE, ZESTORETIC) 20-25 mg per tablet Take 1 Tab by mouth daily.  90 Tab  1   ??? solifenacin (VESICARE) 5 mg tablet Take 1 Tab by mouth daily.  90 Tab  1   ??? fluticasone (FLONASE) 50 mcg/actuation nasal spray 2 Sprays by Both Nostrils route daily.  1 Bottle  3   ??? naproxen (NAPROSYN) 500 mg tablet Take 1 Tab by mouth two (2) times daily (with meals).  20 Tab  0     Facility-Administered Medications Ordered in Other Visits   Medication Dose Route Frequency Provider Last Rate Last Dose   ??? [EXPIRED] heparin (porcine) 100 unit/mL injection 500 Units  500 Units IntraVENous PRN Kennon Holter, MD   500 Units at 11/19/12 1348   ??? sodium chloride (NS) flush 10-40 mL  10-40 mL IntraVENous PRN Kennon Holter, MD   10 mL at 11/19/12 1348   ??? [EXPIRED] 0.9% sodium chloride infusion 250 mL  250 mL IntraVENous CONTINUOUS Kennon Holter, MD   250 mL at 11/19/12 1023   ??? [COMPLETED] pegfilgrastim (NEULASTA) injection 6 mg  6 mg SubCUTAneous ONCE Kennon Holter, MD   6 mg at 11/20/12 1106   ??? [COMPLETED] vinBLAStine (VELBAN) 11 mg in 0.9% sodium chloride 50 mL chemo infusion  11 mg IntraVENous ONCE Kennon Holter, MD   11 mg at 11/19/12 1158   ??? [COMPLETED] dacarbazine  679 mg in 0.9% sodium chloride 250 mL IVPB  679 mg IntraVENous ONCE Kennon Holter, MD   679 mg at 11/19/12 1226       Review of Systems  All items on the multiple organ system review are negative, except as outlined above.    Objective:   BP 115/70   Pulse 75   Temp(Src) 99.8 ??F (37.7 ??C)   Wt 75.29 kg (165 lb 15.8 oz)   BMI 27.62 kg/m2  ECOG Ps=0-1, Pain score=0    Physical Exam:   Gen. Appearance: The patient is in no acute distress.  Skin: There is no bruise or rash.  HEENT: The exam is unremarkable.  Neck: Supple without lymphadenopathy or thyromegaly.  Lungs: Clear to  auscultation and percussion; there are no wheezes or rhonchi.  Heart: Regular rate and rhythm; there are no murmurs, gallops, or rubs.  Abdomen: Bowel sounds are present and normal.  There is no guarding, tenderness, or hepatosplenomegaly.  Extremities: There is no clubbing, cyanosis, or edema.  Neurologic: There are no focal neurologic deficits.  Lymphatics: There is no palpable peripheral lymphadenopathy.    Lab data:      Results for orders placed in visit on 11/19/12   AMB POC COMPLETE CBC, AUTOMATED ONCOLOGY       Result Value Range    HGB (POC) 11.6 (*) 12 - 16 g/dL    HCT (POC) 57.8  36 - 48 %    WBC (POC) 3.8 (*) 4.5 - 13 K/uL    PLATELET (POC) 230  140 - 440 K/uL    LYMPHOCYTES (POC) 39.0%  24 - 44 %    ABS. GRANS (POC) 2.0  1.8 - 9.5 K/uL            Assessment:     1. Hodgkin's disease of head, face, and neck    2. Anemia      The current CBC reveals a hemoglobin is now 10.6 g/dL with hematocrit of 46%.    Plan:   I have explained to the patient that the anemia is in part related to chemotherapy and her underlying malignancy.  I will check a ferritin level and iron profile at this time.  The CBC will be monitored and if the hematocrit should decline below 30% interventional therapy with either Procrit or Aranesp will be recommended. At this time, I will prescribe Augmentin 500 mg 3 times daily for 10 days since she has recently had a abscess in the pelvic area, primarily confined to the pubic area.  A sedimentation rate, and liver function tests, and comprehensive metabolic panel will be obtained. Cycle 3 of her treatment program should begin in 2 weeks. She will continue with her current chemotherapy regimen and I will plan to see her back in clinic in one month.  Orders Placed This Encounter   ??? METABOLIC PANEL, COMPREHENSIVE   ??? IRON PROFILE   ??? FERRITIN   ??? SED RATE (ESR)   ??? HEPATIC FUNCTION PANEL       Kennon Holter, MD  11/20/2012

## 2012-11-20 NOTE — Progress Notes (Signed)
[facility] Progress Note    Date: November 20, 2012    Name: Melissa Leblanc    MRN: [id number]         DOB: 27-Jul-1966      Ms. Friese was assessed and education was provided.     Ms. Girtman's vitals were reviewed.  Visit Vitals   Item Reading   ??? BP 115/70   ??? Pulse 75   ??? Temp 98.8 ??F (37.1 ??C)   ??? Resp 16       Neulasta given as ordered.  Ms. Cubillos tolerated well, and had no complaints.  Patient armband removed and shredded.    [facility] condition at 1010. She is to return on 12/03/12 at 0900 for her next appointment for chemo ABVD C3, D1.    Fransico Setters, RN  November 20, 2012  11:12 AM

## 2012-11-21 LAB — METABOLIC PANEL, COMPREHENSIVE
A-G Ratio: 1.3 (ref 1.1–2.5)
ALT (SGPT): 10 IU/L (ref 0–32)
AST (SGOT): 13 IU/L (ref 0–40)
Albumin: 4 g/dL (ref 3.5–5.5)
Alk. phosphatase: 71 IU/L (ref 39–117)
BUN/Creatinine ratio: 11 (ref 9–23)
BUN: 7 mg/dL (ref 6–24)
Bilirubin, total: 0.2 mg/dL (ref 0.0–1.2)
CO2: 22 mmol/L (ref 19–28)
Calcium: 8.7 mg/dL (ref 8.7–10.2)
Chloride: 103 mmol/L (ref 97–108)
Creatinine: 0.65 mg/dL (ref 0.57–1.00)
GFR est AA: 124 mL/min/{1.73_m2} (ref >59)
GFR est non-AA: 108 mL/min/{1.73_m2} (ref >59)
GLOBULIN, TOTAL: 3.2 g/dL (ref 1.5–4.5)
Glucose: 78 mg/dL (ref 65–99)
Potassium: 4.2 mmol/L (ref 3.5–5.2)
Protein, total: 7.2 g/dL (ref 6.0–8.5)
Sodium: 138 mmol/L (ref 134–144)

## 2012-11-21 LAB — IRON PROFILE
Iron % saturation: 11 % — ABNORMAL LOW (ref 15–55)
Iron: 27 ug/dL — ABNORMAL LOW (ref 35–155)
TIBC: 241 ug/dL — ABNORMAL LOW (ref 250–450)
UIBC: 214 ug/dL (ref 150–375)

## 2012-11-21 LAB — HEPATIC FUNCTION PANEL: Bilirubin, direct: 0.07 mg/dL (ref 0.00–0.40)

## 2012-11-21 LAB — FERRITIN: Ferritin: 179 ng/mL — ABNORMAL HIGH (ref 15–150)

## 2012-11-23 NOTE — Progress Notes (Signed)
Quick Note:    Labs reviewed and noted.  ______

## 2012-11-27 DIAGNOSIS — M25552 Pain in left hip: Secondary | ICD-10-CM | POA: Insufficient documentation

## 2012-11-27 DIAGNOSIS — M549 Dorsalgia, unspecified: Secondary | ICD-10-CM | POA: Insufficient documentation

## 2012-11-27 LAB — AMB POC COMPLETE CBC, AUTOMATED ONCOLOGY
ABS. GRANS (POC): 10.3 10*3/uL — AB (ref 1.8–9.5)
HCT (POC): 35.6 % — AB (ref 36–48)
HGB (POC): 11.1 g/dL — AB (ref 12–16)
LYMPHOCYTES (POC): 17.3 % — AB (ref 24–44)
PLATELET (POC): 158 10*3/uL (ref 140–440)
WBC (POC): 13.5 10*3/uL — AB (ref 4.5–13)

## 2012-11-27 MED ORDER — CYCLOBENZAPRINE 5 MG TAB
5 mg | ORAL_TABLET | Freq: Three times a day (TID) | ORAL | Status: DC | PRN
Start: 2012-11-27 — End: 2014-12-12

## 2012-11-27 MED ORDER — OXYCODONE-ACETAMINOPHEN 7.5 MG-325 MG TAB
ORAL_TABLET | Freq: Four times a day (QID) | ORAL | Status: DC | PRN
Start: 2012-11-27 — End: 2013-01-15

## 2012-11-27 NOTE — Progress Notes (Signed)
Quick Note:    Results reviewed and noted.  ______

## 2012-11-27 NOTE — Progress Notes (Signed)
Hematology/Oncology  Progress Note    Name: Melissa Leblanc  Date: 11/27/2012  DOB: Oct 26, 1965    PCP: Dr. Sidney Ace    Ms. Melissa Leblanc is a 47 year old female who was seen for complaints of musculoskeletal back pain. The patient has Hodgkin's lymphoma and is currently undergoing chemotherapy for this.    Current therapy: ABVD regimen    Subjective:     The patient is here in the clinic today reporting that she has severe low back pain.  She had been doing some work in a home and subsequently has developed severe musculoskeletal back pain.  She has run out of her Percocet pain medication.  She decided to come here for additional evaluation and treatment recommendations.    Past Medical History   Diagnosis Date   ??? Hypertension    ??? Prediabetes    ??? VAIN I (vaginal intraepithelial neoplasia grade I)    ??? Anxiety    ??? Lymphoma    ??? Neck mass    ??? Cancer      lymph node cancer   ??? Diabetes      border line diabetes   ??? GERD (gastroesophageal reflux disease)    ??? Asthma      Past Surgical History   Procedure Laterality Date   ??? Hx gyn       BTL   ??? Hx hysterectomy  2004   ??? Hx other surgical       lymph node biopsies neck     History     Social History   ??? Marital Status: SINGLE     Spouse Name: N/A     Number of Children: N/A   ??? Years of Education: N/A     Occupational History   ??? Not on file.     Social History Main Topics   ??? Smoking status: Current Every Day Smoker -- 0.25 packs/day for 25 years   ??? Smokeless tobacco: Never Used   ??? Alcohol Use: 3.0 oz/week     6 Cans of beer per week      Comment: every 3 to 4 days   ??? Drug Use: Yes     Special: Marijuana      Comment: one or 2 puffs occasionally   ??? Sexually Active: No     Other Topics Concern   ??? Not on file     Social History Narrative   ??? No narrative on file     Family History   Problem Relation Age of Onset   ??? Cancer Mother    ??? Breast Cancer Mother    ??? Cancer Father    ??? Alcohol abuse Neg Hx    ??? Arthritis-rheumatoid Neg Hx    ??? Asthma Neg Hx    ??? Bleeding Prob Neg Hx     ??? Diabetes Neg Hx    ??? Elevated Lipids Neg Hx    ??? Headache Neg Hx    ??? Heart Disease Neg Hx    ??? Hypertension Neg Hx    ??? Lung Disease Neg Hx    ??? Migraines Neg Hx    ??? Psychiatric Disorder Neg Hx    ??? Stroke Neg Hx    ??? Mental Retardation Neg Hx      Current Outpatient Prescriptions   Medication Sig Dispense Refill   ??? cyclobenzaprine (FLEXERIL) 5 mg tablet Take 1 Tab by mouth three (3) times daily as needed for Muscle Spasm(s).  90 Tab  3   ???  oxyCODONE-acetaminophen (PERCOCET) 7.5-325 mg per tablet Take 1 Tab by mouth every six (6) hours as needed for Pain.  120 Tab  3   ??? amoxicillin-clavulanate (AUGMENTIN) 500-125 mg per tablet Take 1 Tab by mouth three (3) times daily for 10 days.  30 Tab  0   ??? allopurinol (ZYLOPRIM) 300 mg tablet Take 1 Tab by mouth daily.  14 Tab  0   ??? ferrous sulfate 325 mg (65 mg iron) tablet Take 1 Tab by mouth Daily (before breakfast).  30 Tab  3   ??? lidocaine-prilocaine (EMLA) topical cream Apply to mediport 1 hour prior to chemo  30 g  0   ??? warfarin (COUMADIN) 1 mg tablet Take 1 tab po qd at 6pm or after  30 Tab  6   ??? prochlorperazine (COMPAZINE) 10 mg tablet Take 1 tab po Q 6 hours with Benadryl 25mg  for nausea  60 Tab  2   ??? diphenhydrAMINE (BENADRYL) 25 mg capsule Take 1 tab po Q 6 hours with compazine for nausea  30 Cap  2   ??? ibuprofen (MOTRIN) 800 mg tablet Take 1 Tab by mouth three (3) times daily as needed for Pain.  40 Tab  0   ??? docusate sodium (COLACE) 50 mg capsule Take 2 Caps by mouth two (2) times a day for 90 days.  60 Cap  2   ??? HYDROcodone-acetaminophen (NORCO) 5-325 mg per tablet Take 1-2 Tabs by mouth every six (6) hours as needed for Pain.  40 Tab  0   ??? HYDROcodone-acetaminophen (NORCO) 5-325 mg per tablet 1- 2 tablets every 4 -6 hours as needed for pain  30 Tab  o   ??? ALPRAZolam (XANAX) 0.5 mg tablet Take 1 Tab by mouth three (3) times daily as needed for Anxiety.  30 Tab  0   ??? LORazepam (ATIVAN) 0.5 mg tablet Take 1-2 Tabs by mouth every eight (8) hours as  needed for Anxiety. 1/2 hour before procedure.  4 Tab  0   ??? PARoxetine (PAXIL) 20 mg tablet Take 1 Tab by mouth daily.  90 Tab  1   ??? lisinopril-hydrochlorothiazide (PRINZIDE, ZESTORETIC) 20-25 mg per tablet Take 1 Tab by mouth daily.  90 Tab  1   ??? solifenacin (VESICARE) 5 mg tablet Take 1 Tab by mouth daily.  90 Tab  1   ??? fluticasone (FLONASE) 50 mcg/actuation nasal spray 2 Sprays by Both Nostrils route daily.  1 Bottle  3   ??? naproxen (NAPROSYN) 500 mg tablet Take 1 Tab by mouth two (2) times daily (with meals).  20 Tab  0       Review of Systems  All items on the multiple organ system review are negative, except as outlined above.    Objective:   BP 124/83   Pulse 103   Temp(Src) 98.9 ??F (37.2 ??C) (Oral)   Ht 5\' 5"  (1.651 m)   Wt 77.111 kg (170 lb)   BMI 28.29 kg/m2    Physical Exam:   Gen. Appearance: The patient is in no acute distress.  Skin: There is no bruise or rash.  HEENT: The exam is unremarkable.  Neck: Supple without lymphadenopathy or thyromegaly.  Lungs: Clear to auscultation and percussion; there are no wheezes or rhonchi.  Heart: Regular rate and rhythm; there are no murmurs, gallops, or rubs.  Abdomen: Bowel sounds are present and normal.  There is no guarding, tenderness, or hepatosplenomegaly.  Extremities: There is no clubbing, cyanosis, or  edema.  Neurologic: There are no focal neurologic deficits.  Lymphatics: There is no palpable peripheral lymphadenopathy. Musculoskeletal/back: The patient has some focal paraspinal  Muscle spasm in the thoracic and lumbar area.    Lab data:      Results for orders placed in visit on 11/27/12   AMB POC COMPLETE CBC, AUTOMATED ONCOLOGY       Result Value Range    HGB (POC) 11.1 (*) 12 - 16 g/dL    HCT (POC) 16.1 (*) 36 - 48 %    WBC (POC) 13.5 (*) 4.5 - 13 K/uL    PLATELET (POC) 158  140 - 440 K/uL    LYMPHOCYTES (POC) 17.3 (*) 24 - 44 %    ABS. GRANS (POC) 10.3 (*) 1.8 - 9.5 K/uL            Assessment:     1. Hodgkin's disease of head, face, and neck     2. Back pain      I have explained to the patient.  She has developed musculoskeletal back pain.    Plan:   I recommended that the patient begin using a heating pad to the lower back.  She will also begin Flexeril 5 mg 3 times daily and I will change her Percocet 7.5 mg with instructions to take 1 tablet every 6 hours.  She was also instructed to rest on a farm surface and to keep her appointments that already on the books, for after she completes her next dose of chemotherapy.  Orders Placed This Encounter   ??? AMB POC COMPLETE CBC, AUTOMATED ONCOLOGY   ??? cyclobenzaprine (FLEXERIL) 5 mg tablet     Sig: Take 1 Tab by mouth three (3) times daily as needed for Muscle Spasm(s).     Dispense:  90 Tab     Refill:  3   ??? oxyCODONE-acetaminophen (PERCOCET) 7.5-325 mg per tablet     Sig: Take 1 Tab by mouth every six (6) hours as needed for Pain.     Dispense:  120 Tab     Refill:  3       Dhriti Fales A Alanda Slim, MD  11/27/2012

## 2012-12-02 MED ORDER — DIPHENHYDRAMINE HCL 50 MG/ML IJ SOLN
50 mg/mL | Freq: Once | INTRAMUSCULAR | Status: AC
Start: 2012-12-02 — End: 2012-12-03
  Administered 2012-12-03: 14:00:00 via INTRAVENOUS

## 2012-12-02 MED ORDER — PALONOSETRON 0.25 MG/5 ML IV
0.25 mg/5 mL | Freq: Once | INTRAVENOUS | Status: AC
Start: 2012-12-02 — End: 2012-12-03
  Administered 2012-12-03: 14:00:00 via INTRAVENOUS

## 2012-12-02 MED ORDER — DEXAMETHASONE SODIUM PHOSPHATE 4 MG/ML IJ SOLN
4 mg/mL | Freq: Once | INTRAMUSCULAR | Status: AC
Start: 2012-12-02 — End: 2012-12-03
  Administered 2012-12-03: 15:00:00 via INTRAVENOUS

## 2012-12-02 MED ORDER — DOXORUBICIN 10 MG/5 ML IV
105 mg/5 mL | Freq: Once | INTRAVENOUS | Status: AC
Start: 2012-12-02 — End: 2012-12-03
  Administered 2012-12-03: 15:00:00 via INTRAVENOUS

## 2012-12-02 MED ORDER — SODIUM CHLORIDE 0.9 % IV
200 mg | Freq: Once | INTRAVENOUS | Status: AC
Start: 2012-12-02 — End: 2012-12-03
  Administered 2012-12-03: 17:00:00 via INTRAVENOUS

## 2012-12-02 MED ORDER — BLEOMYCIN 15 UNIT SOLUTION FOR INJECTION
15 unit | Freq: Once | INTRAMUSCULAR | Status: AC
Start: 2012-12-02 — End: 2012-12-03
  Administered 2012-12-03: 16:00:00 via INTRAVENOUS

## 2012-12-02 MED ORDER — VINBLASTINE 1 MG/ML IV
1 mg/mL | Freq: Once | INTRAVENOUS | Status: AC
Start: 2012-12-02 — End: 2012-12-03
  Administered 2012-12-03: 16:00:00 via INTRAVENOUS

## 2012-12-03 LAB — AMB POC COMPLETE CBC, AUTOMATED ONCOLOGY
ABS. GRANS (POC): 4.3 10*3/uL (ref 1.8–9.5)
HCT (POC): 36.7 % (ref 36–48)
HGB (POC): 11.6 g/dL — AB (ref 12–16)
LYMPHOCYTES (POC): 23 % (ref 24–44)
PLATELET (POC): 179 10*3/uL (ref 140–440)
WBC (POC): 6.4 10*3/uL (ref 4.5–13)

## 2012-12-03 MED ORDER — SODIUM CHLORIDE 0.9 % IV
INTRAVENOUS | Status: DC
Start: 2012-12-03 — End: 2012-12-07
  Administered 2012-12-03: 14:00:00 via INTRAVENOUS

## 2012-12-03 MED ORDER — HEPARIN LOCK FLUSH 100 UNIT/ML IV SOLN
100 unit/mL | INTRAVENOUS | Status: DC | PRN
Start: 2012-12-03 — End: 2012-12-07
  Administered 2012-12-03: 18:00:00 via INTRAVENOUS

## 2012-12-03 MED ORDER — SODIUM CHLORIDE 0.9 % IJ SYRG
INTRAMUSCULAR | Status: DC | PRN
Start: 2012-12-03 — End: 2012-12-07
  Administered 2012-12-03 (×2): via INTRAVENOUS

## 2012-12-03 NOTE — Progress Notes (Signed)
Quick Note:    Labs reviewed and noted.  ______

## 2012-12-03 NOTE — Progress Notes (Signed)
[facility] Progress Note    Date: December 03, 2012    Name: Melissa Leblanc              MRN: [id number]              DOB: 19-Jan-1966    [name] was assessed and education was provided.     [name]'s vitals were reviewed.  Visit Vitals   Item Reading   ??? BP 134/83   ??? Pulse 99   ??? Temp 97 ??F (36.1 ??C)   ??? Resp 16   ??? Ht 5\' 5"  (1.651 m)   ??? Wt 78.472 kg (173 lb)   ??? BMI 28.79 kg/m2       Lab results were obtained and reviewed.    Chemotherapy Cycle: C3, D1.     Pre-medications were administered as ordered and chemotherapy was given as ordered.    [name] tolerated infusion, and had no complaints at this time.  Patient armband removed and shredded.    [name] [facility] in stable condition at 1355. She is to return on 12/17/12 at 0900 for her next appointment for chemo ABVD C3, D15.     Fransico Setters, RN  December 03, 2012  10:01 AM

## 2012-12-15 MED ORDER — DIPHENHYDRAMINE HCL 50 MG/ML IJ SOLN
50 mg/mL | Freq: Once | INTRAMUSCULAR | Status: AC
Start: 2012-12-15 — End: 2012-12-17
  Administered 2012-12-17: 15:00:00 via INTRAVENOUS

## 2012-12-15 MED ORDER — SODIUM CHLORIDE 0.9 % IV
4 mg/mL | Freq: Once | INTRAVENOUS | Status: AC
Start: 2012-12-15 — End: 2012-12-17
  Administered 2012-12-17: 15:00:00 via INTRAVENOUS

## 2012-12-15 MED ORDER — BLEOMYCIN 15 UNIT SOLUTION FOR INJECTION
15 unit | Freq: Once | INTRAMUSCULAR | Status: AC
Start: 2012-12-15 — End: 2012-12-17
  Administered 2012-12-17: 16:00:00 via INTRAVENOUS

## 2012-12-15 MED ORDER — VINBLASTINE 1 MG/ML IV
1 mg/mL | Freq: Once | INTRAVENOUS | Status: AC
Start: 2012-12-15 — End: 2012-12-17
  Administered 2012-12-17: 16:00:00 via INTRAVENOUS

## 2012-12-15 MED ORDER — PALONOSETRON 0.25 MG/5 ML IV
0.25 mg/5 mL | Freq: Once | INTRAVENOUS | Status: AC
Start: 2012-12-15 — End: 2012-12-17
  Administered 2012-12-17: 15:00:00 via INTRAVENOUS

## 2012-12-15 MED ORDER — DOXORUBICIN 10 MG/5 ML IV
105 mg/5 mL | Freq: Once | INTRAVENOUS | Status: AC
Start: 2012-12-15 — End: 2012-12-17
  Administered 2012-12-17: 15:00:00 via INTRAVENOUS

## 2012-12-15 MED ORDER — SODIUM CHLORIDE 0.9 % IV
200 mg | Freq: Once | INTRAVENOUS | Status: AC
Start: 2012-12-15 — End: 2012-12-17
  Administered 2012-12-17: 17:00:00 via INTRAVENOUS

## 2012-12-16 MED ORDER — SODIUM CHLORIDE 0.9 % IJ SYRG
INTRAMUSCULAR | Status: DC | PRN
Start: 2012-12-16 — End: 2012-12-21
  Administered 2012-12-17 (×2): via INTRAVENOUS

## 2012-12-16 MED ORDER — PEGFILGRASTIM 6 MG/0.6ML SC SYRG
6 mg/0. mL | Freq: Once | SUBCUTANEOUS | Status: AC
Start: 2012-12-16 — End: 2012-12-18
  Administered 2012-12-18: 15:00:00 via SUBCUTANEOUS

## 2012-12-16 MED ORDER — SODIUM CHLORIDE 0.9 % IV
INTRAVENOUS | Status: AC
Start: 2012-12-16 — End: 2012-12-17
  Administered 2012-12-17: 15:00:00 via INTRAVENOUS

## 2012-12-16 MED ORDER — HEPARIN LOCK FLUSH 100 UNIT/ML IV SOLN
100 unit/mL | INTRAVENOUS | Status: AC | PRN
Start: 2012-12-16 — End: 2012-12-17

## 2012-12-17 LAB — AMB POC COMPLETE CBC, AUTOMATED ONCOLOGY
ABS. GRANS (POC): 1.3 10*3/uL — AB (ref 1.8–9.5)
HCT (POC): 34.1 % — AB (ref 36–48)
HGB (POC): 11.1 g/dL — AB (ref 12–16)
LYMPHOCYTES (POC): 32 % (ref 24–44)
PLATELET (POC): 225 10*3/uL (ref 140–440)
WBC (POC): 2.5 10*3/uL — AB (ref 4.5–13)

## 2012-12-17 MED ORDER — HEPARIN LOCK FLUSH 100 UNIT/ML IV SOLN
100 unit/mL | INTRAVENOUS | Status: DC | PRN
Start: 2012-12-17 — End: 2012-12-21
  Administered 2012-12-17: 18:00:00 via INTRAVENOUS

## 2012-12-17 NOTE — Progress Notes (Signed)
[facility] Progress Note    Date: Dec 17, 2012    Name: Melissa Leblanc              MRN: [id number]              DOB: [birth year]    [name] was assessed and education was provided.     [name]'s vitals were reviewed.  Visit Vitals   Item Reading   ??? BP 128/82   ??? Pulse 81   ??? Temp 97.9 ??F (36.6 ??C)   ??? Resp 16   ??? Ht 5\' 5"  (1.651 m)   ??? Wt 79.379 kg (175 lb)   ??? BMI 29.12 kg/m2       Lab results were obtained and reviewed.  Recent Results (from the past 12 hour(s))   AMB POC COMPLETE CBC, AUTOMATED ONCOLOGY    Collection Time     12/17/12 11:51 AM       Result Value Range    HGB (POC) 11.1 (*) 12 - 16 g/dL    HCT (POC) 40.9 (*) 36 - 48 %    WBC (POC) 2.5 (*) 4.5 - 13 K/uL    PLATELET (POC) 225  140 - 440 K/uL    LYMPHOCYTES (POC) 32.0%  24 - 44 %    ABS. GRANS (POC) 1.3 (*) 1.8 - 9.5 K/uL         Chemotherapy Cycle: ABVD C3, D15.     Pre-medications were administered as ordered and chemotherapy was given as ordered.     [name] tolerated infusion, and had no complaints at this time    [name] was [facility] in stable condition at 1405. She is to return on 12/18/12 at 1600 for her next appointment for Neulasta.    Fransico Setters, RN  Dec 17, 2012

## 2012-12-17 NOTE — Progress Notes (Signed)
Quick Note:    Labs reviewed and noted.  ______
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
## 2012-12-18 NOTE — Progress Notes (Signed)
[facility] Progress Note    Date: Dec 18, 2012    Name: Vonnetta Akey    MRN: [id number]         DOB: 04-Jun-1966      Ms. Morro was assessed and education was provided.     Ms. Cornette's vitals were reviewed.  Visit Vitals   Item Reading   ??? BP 123/80   ??? Pulse 89   ??? Temp 99.1 ??F (37.3 ??C)   ??? Resp 16     Neulasta given as ordered.  Ms. Dinsmore tolerated well, and had no complaints.  Patient armband removed and shredded.    Ms. Harshman [facility] in stable condition at 1125. She is to return on 12/31/12 at 0900 for her next appointment for ABVD C4, D1.    Fransico Setters, RN  Dec 18, 2012  11:29 AM

## 2012-12-19 LAB — METABOLIC PANEL, COMPREHENSIVE
A-G Ratio: 1.3 (ref 1.1–2.5)
ALT (SGPT): 13 IU/L (ref 0–32)
AST (SGOT): 13 IU/L (ref 0–40)
Albumin: 3.8 g/dL (ref 3.5–5.5)
Alk. phosphatase: 60 IU/L (ref 39–117)
BUN/Creatinine ratio: 14 (ref 9–23)
BUN: 8 mg/dL (ref 6–24)
Bilirubin, total: 0.1 mg/dL (ref 0.0–1.2)
CO2: 23 mmol/L (ref 19–28)
Calcium: 8.7 mg/dL (ref 8.7–10.2)
Chloride: 106 mmol/L (ref 97–108)
Creatinine: 0.59 mg/dL (ref 0.57–1.00)
GFR est AA: 128 mL/min/{1.73_m2} (ref >59)
GFR est non-AA: 111 mL/min/{1.73_m2} (ref >59)
GLOBULIN, TOTAL: 2.9 g/dL (ref 1.5–4.5)
Glucose: 79 mg/dL (ref 65–99)
Potassium: 4.5 mmol/L (ref 3.5–5.2)
Protein, total: 6.7 g/dL (ref 6.0–8.5)
Sodium: 141 mmol/L (ref 134–144)

## 2012-12-19 LAB — SED RATE (ESR)

## 2012-12-30 MED ORDER — SODIUM CHLORIDE 0.9 % IV
1 mg/mL | Freq: Once | INTRAVENOUS | Status: AC
Start: 2012-12-30 — End: 2012-12-31
  Administered 2012-12-31: 19:00:00 via INTRAVENOUS

## 2012-12-30 MED ORDER — DEXAMETHASONE SODIUM PHOSPHATE 4 MG/ML IJ SOLN
4 mg/mL | Freq: Once | INTRAMUSCULAR | Status: AC
Start: 2012-12-30 — End: 2012-12-31
  Administered 2012-12-31: 17:00:00 via INTRAVENOUS

## 2012-12-30 MED ORDER — SODIUM CHLORIDE 0.9 % IV
105 mg/5 mL | Freq: Once | INTRAVENOUS | Status: AC
Start: 2012-12-30 — End: 2012-12-31
  Administered 2012-12-31: 18:00:00 via INTRAVENOUS

## 2012-12-30 MED ORDER — DIPHENHYDRAMINE HCL 50 MG/ML IJ SOLN
50 mg/mL | Freq: Once | INTRAMUSCULAR | Status: AC
Start: 2012-12-30 — End: 2012-12-31
  Administered 2012-12-31: 17:00:00 via INTRAVENOUS

## 2012-12-30 MED ORDER — PALONOSETRON 0.25 MG/5 ML IV
0.25 mg/5 mL | Freq: Once | INTRAVENOUS | Status: AC
Start: 2012-12-30 — End: 2012-12-31
  Administered 2012-12-31: 17:00:00 via INTRAVENOUS

## 2012-12-30 MED ORDER — SODIUM CHLORIDE 0.9 % IV
15 unit | Freq: Once | INTRAVENOUS | Status: AC
Start: 2012-12-30 — End: 2012-12-31
  Administered 2012-12-31: 18:00:00 via INTRAVENOUS

## 2012-12-30 MED ORDER — SODIUM CHLORIDE 0.9 % IV
200 mg | Freq: Once | INTRAVENOUS | Status: AC
Start: 2012-12-30 — End: 2012-12-31
  Administered 2012-12-31: 19:00:00 via INTRAVENOUS

## 2012-12-30 MED FILL — VINBLASTINE 1 MG/ML IV: 1 mg/mL | INTRAVENOUS | Qty: 11

## 2012-12-30 MED FILL — DOXORUBICIN 10 MG/5 ML IV: 10 mg/5 mL | INTRAVENOUS | Qty: 22.5

## 2012-12-30 MED FILL — BLEOMYCIN 15 UNIT SOLUTION FOR INJECTION: 15 unit | INTRAMUSCULAR | Qty: 18

## 2012-12-30 MED FILL — DEXAMETHASONE SODIUM PHOSPHATE 4 MG/ML IJ SOLN: 4 mg/mL | INTRAMUSCULAR | Qty: 5

## 2012-12-30 MED FILL — DACARBAZINE 200 MG IV SOLUTION: 200 mg | INTRAVENOUS | Qty: 679

## 2012-12-31 LAB — CBC WITH 3 PART DIFF
ABS. LYMPHOCYTES: 1.4 10*3/uL (ref 1.1–5.9)
ABS. MIXED CELLS: 0.6 10*3/uL (ref 0.0–2.3)
ABS. NEUTROPHILS: 5.8 10*3/uL (ref 1.8–9.5)
HCT: 36.1 % (ref 36–48)
HGB: 12.3 g/dL (ref 12.0–16.0)
LYMPHOCYTES: 18 % (ref 14–44)
MCH: 28.2 PG (ref 25.0–35.0)
MCHC: 34.1 g/dL (ref 31–37)
MCV: 82.8 FL (ref 78–102)
Mixed cells: 7 % (ref 0.1–17)
NEUTROPHILS: 75 % — ABNORMAL HIGH (ref 40–70)
PLATELET: 135 10*3/uL — ABNORMAL LOW (ref 140–440)
RBC: 4.36 M/uL (ref 4.10–5.10)
RDW: 20.2 % — ABNORMAL HIGH (ref 11.5–14.5)
WBC: 7.8 10*3/uL (ref 4.5–13.0)

## 2012-12-31 MED ORDER — HEPARIN LOCK FLUSH 100 UNIT/ML IV SOLN
100 unit/mL | INTRAVENOUS | Status: AC
Start: 2012-12-31 — End: 2012-12-31
  Administered 2012-12-31: 20:00:00

## 2012-12-31 MED ORDER — SODIUM CHLORIDE 0.9 % IJ SYRG
Freq: Three times a day (TID) | INTRAMUSCULAR | Status: DC
Start: 2012-12-31 — End: 2013-01-04
  Administered 2012-12-31: 20:00:00 via INTRAVENOUS

## 2012-12-31 MED ORDER — SODIUM CHLORIDE 0.9 % IV
INTRAVENOUS | Status: DC
Start: 2012-12-31 — End: 2013-01-04
  Administered 2012-12-31: 16:00:00 via INTRAVENOUS

## 2012-12-31 MED FILL — DIPHENHYDRAMINE HCL 50 MG/ML IJ SOLN: 50 mg/mL | INTRAMUSCULAR | Qty: 1

## 2012-12-31 MED FILL — ALOXI 0.25 MG/5 ML INTRAVENOUS SOLUTION: 0.25 mg/5 mL | INTRAVENOUS | Qty: 1

## 2012-12-31 MED FILL — HEPARIN LOCK FLUSH 100 UNIT/ML IV SOLN: 100 unit/mL | INTRAVENOUS | Qty: 5

## 2012-12-31 NOTE — Addendum Note (Signed)
Addended by: Rosezena Sensor on: 12/31/2012 02:02 PM     Modules accepted: Orders

## 2012-12-31 NOTE — Progress Notes (Signed)
Quick Note:    Labs reviewed and noted.  ______

## 2012-12-31 NOTE — Progress Notes (Signed)
1125- Pt arrives late.  States she's had a difficulty morning getting motivated. She has been fatigued.  Vitals stable.  RUC MP accessed.  Good blood return and flushes easily with ns.  Lab obtained./cl    1300- Lab good for chemo.  NS infusing kvo and pre-meds started as ordered./cl    1350- Adriamycin infusing over 10 min./cl    1400- Adria complete. NS infusing to flush./cl    1420- bleomycin infusing over 10 min    1430- bleo complete.  Tolerated well.  NS infusing to flush./cl    1450-vinblastine infusing over 10 min./cl    1500- vinblastine complete.  Tolerated well. NS infusing to flush./cl    1510- DTIC infusing over 1 hr./cl    1610- DTIC complete.  No complaints.  NS infusing to flush./cl    1625- Infusion complete.  Tolerated well. MP flushed per written order and de-accessed.  She will return 01/14/13 Foe C4D8 ABVD.  Discharged to home in stable condition./cl

## 2013-01-13 MED ORDER — VINBLASTINE 1 MG/ML IV
1 mg/mL | Freq: Once | INTRAVENOUS | Status: AC
Start: 2013-01-13 — End: 2013-01-14
  Administered 2013-01-14: 20:00:00 via INTRAVENOUS

## 2013-01-13 MED ORDER — PALONOSETRON 0.25 MG/5 ML IV
0.25 mg/5 mL | Freq: Once | INTRAVENOUS | Status: AC
Start: 2013-01-13 — End: 2013-01-14
  Administered 2013-01-14: 18:00:00 via INTRAVENOUS

## 2013-01-13 MED ORDER — DIPHENHYDRAMINE HCL 50 MG/ML IJ SOLN
50 mg/mL | Freq: Once | INTRAMUSCULAR | Status: AC
Start: 2013-01-13 — End: 2013-01-14
  Administered 2013-01-14: 18:00:00 via INTRAVENOUS

## 2013-01-13 MED ORDER — DACARBAZINE 200 MG IV SOLUTION
200 mg | Freq: Once | INTRAVENOUS | Status: AC
Start: 2013-01-13 — End: 2013-01-14
  Administered 2013-01-14: 20:00:00 via INTRAVENOUS

## 2013-01-13 MED ORDER — DEXAMETHASONE SODIUM PHOSPHATE 4 MG/ML IJ SOLN
4 mg/mL | Freq: Once | INTRAMUSCULAR | Status: AC
Start: 2013-01-13 — End: 2013-01-14
  Administered 2013-01-14: 18:00:00 via INTRAVENOUS

## 2013-01-13 MED ORDER — SODIUM CHLORIDE 0.9 % IV
15 unit | Freq: Once | INTRAVENOUS | Status: AC
Start: 2013-01-13 — End: 2013-01-14
  Administered 2013-01-14: 19:00:00 via INTRAVENOUS

## 2013-01-13 MED ORDER — DOXORUBICIN 10 MG/5 ML IV
10 mg/5 mL | Freq: Once | INTRAVENOUS | Status: AC
Start: 2013-01-13 — End: 2013-01-14
  Administered 2013-01-14: 19:00:00 via INTRAVENOUS

## 2013-01-13 MED FILL — DEXAMETHASONE SODIUM PHOSPHATE 4 MG/ML IJ SOLN: 4 mg/mL | INTRAMUSCULAR | Qty: 5

## 2013-01-13 MED FILL — BLEOMYCIN 15 UNIT SOLUTION FOR INJECTION: 15 unit | INTRAMUSCULAR | Qty: 18

## 2013-01-13 MED FILL — DACARBAZINE 200 MG IV SOLUTION: 200 mg | INTRAVENOUS | Qty: 679

## 2013-01-13 MED FILL — DOXORUBICIN 10 MG/5 ML IV: 10 mg/5 mL | INTRAVENOUS | Qty: 22.5

## 2013-01-13 MED FILL — VINBLASTINE 1 MG/ML IV: 1 mg/mL | INTRAVENOUS | Qty: 11

## 2013-01-14 MED ORDER — PEGFILGRASTIM 6 MG/0.6ML SC SYRG
6 mg/0. mL | Freq: Once | SUBCUTANEOUS | Status: AC
Start: 2013-01-14 — End: 2013-01-15
  Administered 2013-01-15: 19:00:00 via SUBCUTANEOUS

## 2013-01-14 MED ORDER — HEPARIN LOCK FLUSH 100 UNIT/ML IV SOLN
100 unit/mL | INTRAVENOUS | Status: DC | PRN
Start: 2013-01-14 — End: 2013-01-18
  Administered 2013-01-14: 21:00:00 via INTRAVENOUS

## 2013-01-14 MED ORDER — SODIUM CHLORIDE 0.9 % IV
INTRAVENOUS | Status: DC
Start: 2013-01-14 — End: 2013-01-18
  Administered 2013-01-14: 16:00:00 via INTRAVENOUS

## 2013-01-14 MED ORDER — SODIUM CHLORIDE 0.9 % IJ SYRG
INTRAMUSCULAR | Status: DC | PRN
Start: 2013-01-14 — End: 2013-01-18
  Administered 2013-01-14 (×2): via INTRAVENOUS

## 2013-01-14 MED FILL — HEPARIN LOCK FLUSH 100 UNIT/ML IV SOLN: 100 unit/mL | INTRAVENOUS | Qty: 5

## 2013-01-14 MED FILL — ALOXI 0.25 MG/5 ML INTRAVENOUS SOLUTION: 0.25 mg/5 mL | INTRAVENOUS | Qty: 1

## 2013-01-14 MED FILL — BD POSIFLUSH NORMAL SALINE 0.9 % INJECTION SYRINGE: INTRAMUSCULAR | Qty: 40

## 2013-01-14 MED FILL — DIPHENHYDRAMINE HCL 50 MG/ML IJ SOLN: 50 mg/mL | INTRAMUSCULAR | Qty: 1

## 2013-01-14 MED FILL — SODIUM CHLORIDE 0.9 % IV: INTRAVENOUS | Qty: 250

## 2013-01-14 NOTE — Progress Notes (Signed)
[facility] Progress Note    Date: Jan 14, 2013    Name: Azilee Pirro              MRN: [id number]              DOB: 1966-05-09    Ms. Prange was assessed and education was provided.     Ms. Sansoucie's vitals were reviewed.  Visit Vitals   Item Reading   ??? BP 119/71   ??? Pulse 88   ??? Temp 98.8 ??F (37.1 ??C)   ??? Resp 16   ??? Ht 5\' 5"  (1.651 m)   ??? Wt 78.472 kg (173 lb)   ??? BMI 28.79 kg/m2       Lab results were obtained and reviewed.      Chemotherapy Cycle:  C4, D15.     Pre-medications were administered as ordered and chemotherapy was given as ordered.     Ms. Padia tolerated infusion, and had no complaints at this time.  Patient armband removed and shredded.    Ms. Labo was [facility] in stable condition at 1727. She is to return on 01/15/13 at 1600 for her next appointment for Neulasta.    Fransico Setters, RN  Jan 14, 2013

## 2013-01-15 LAB — CBC WITH AUTOMATED DIFF
ABS. BASOPHILS: 0 10*3/uL (ref 0.0–0.06)
ABS. BASOPHILS: 0 10*3/uL (ref 0.0–0.06)
ABS. EOSINOPHILS: 0 10*3/uL (ref 0.0–0.4)
ABS. EOSINOPHILS: 0 10*3/uL (ref 0.0–0.4)
ABS. LYMPHOCYTES: 1.1 10*3/uL (ref 0.9–3.6)
ABS. LYMPHOCYTES: 1.1 10*3/uL (ref 0.9–3.6)
ABS. MONOCYTES: 0.7 10*3/uL (ref 0.05–1.2)
ABS. MONOCYTES: 0.7 10*3/uL (ref 0.05–1.2)
ABS. NEUTROPHILS: 1.8 10*3/uL (ref 1.8–8.0)
ABS. NEUTROPHILS: 1.8 10*3/uL (ref 1.8–8.0)
BASOPHILS: 0 % (ref 0–2)
BASOPHILS: 0 % (ref 0–2)
EOSINOPHILS: 1 % (ref 0–5)
EOSINOPHILS: 1 % (ref 0–5)
HCT: 35.9 % (ref 35.0–45.0)
HCT: 35.9 % (ref 35.0–45.0)
HGB: 12.1 g/dL (ref 12.0–16.0)
HGB: 12.1 g/dL (ref 12.0–16.0)
LYMPHOCYTES: 30 % (ref 21–52)
LYMPHOCYTES: 30 % (ref 21–52)
MCH: 27.4 PG (ref 24.0–34.0)
MCH: 27.4 PG (ref 24.0–34.0)
MCHC: 33.7 g/dL (ref 31.0–37.0)
MCHC: 33.7 g/dL (ref 31.0–37.0)
MCV: 81.2 FL (ref 74.0–97.0)
MCV: 81.2 FL (ref 74.0–97.0)
MONOCYTES: 20 % — ABNORMAL HIGH (ref 3–10)
MONOCYTES: 20 % — ABNORMAL HIGH (ref 3–10)
MPV: 10.4 FL (ref 9.2–11.8)
MPV: 10.4 FL (ref 9.2–11.8)
NEUTROPHILS: 49 % (ref 40–73)
NEUTROPHILS: 49 % (ref 40–73)
PLATELET: 216 10*3/uL (ref 135–420)
PLATELET: 216 10*3/uL (ref 135–420)
RBC: 4.42 M/uL (ref 4.20–5.30)
RBC: 4.42 M/uL (ref 4.20–5.30)
RDW: 18.8 % — ABNORMAL HIGH (ref 11.6–14.5)
RDW: 18.8 % — ABNORMAL HIGH (ref 11.6–14.5)
WBC: 3.6 10*3/uL — ABNORMAL LOW (ref 4.6–13.2)
WBC: 3.6 10*3/uL — ABNORMAL LOW (ref 4.6–13.2)

## 2013-01-15 MED ORDER — OXYCODONE-ACETAMINOPHEN 7.5 MG-325 MG TAB
ORAL_TABLET | Freq: Four times a day (QID) | ORAL | Status: DC | PRN
Start: 2013-01-15 — End: 2013-03-19

## 2013-01-15 MED FILL — NEULASTA 6 MG/0.6 ML SUBCUTANEOUS SYRINGE: 6 mg/0. mL | SUBCUTANEOUS | Qty: 1
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
## 2013-01-15 NOTE — Progress Notes (Signed)
Quick Note:    Labs reviewed and noted.  ______

## 2013-01-15 NOTE — Progress Notes (Signed)
[facility] OPIC Progress Note    Date: Jan 15, 2013    Name: Melissa Leblanc    MRN: [id number]         DOB: October 17, 1965      [name] was assessed and education was provided.     [name]'s vitals were reviewed.  Visit Vitals   Item Reading   ??? BP 141/83   ??? Pulse 73   ??? Temp 99.3 ??F (37.4 ??C)   ??? Resp 16       Neulasta was administered as ordered.    [name] tolerated well, and had no complaints.  Patient armband removed and shredded.    [name] [facility] condition at 1515. She is to return on 01/28/13 at 1100 for her next appointment. For ABVD C5, D1/  Fransico Setters, RN  Jan 15, 2013  3:37 PM

## 2013-01-27 MED ORDER — DOXORUBICIN 10 MG/5 ML IV
10 mg/5 mL | Freq: Once | INTRAVENOUS | Status: AC
Start: 2013-01-27 — End: 2013-01-28
  Administered 2013-01-28: 17:00:00 via INTRAVENOUS

## 2013-01-27 MED ORDER — PALONOSETRON 0.25 MG/5 ML IV
0.25 mg/5 mL | Freq: Once | INTRAVENOUS | Status: AC
Start: 2013-01-27 — End: 2013-01-28
  Administered 2013-01-28: 16:00:00 via INTRAVENOUS

## 2013-01-27 MED ORDER — BLEOMYCIN 15 UNIT SOLUTION FOR INJECTION
15 unit | Freq: Once | INTRAMUSCULAR | Status: AC
Start: 2013-01-27 — End: 2013-01-28
  Administered 2013-01-28: 17:00:00 via INTRAVENOUS

## 2013-01-27 MED ORDER — DIPHENHYDRAMINE HCL 50 MG/ML IJ SOLN
50 mg/mL | Freq: Once | INTRAMUSCULAR | Status: AC
Start: 2013-01-27 — End: 2013-01-28
  Administered 2013-01-28: 16:00:00 via INTRAVENOUS

## 2013-01-27 MED ORDER — SODIUM CHLORIDE 0.9 % IV
1 mg/mL | Freq: Once | INTRAVENOUS | Status: AC
Start: 2013-01-27 — End: 2013-01-28
  Administered 2013-01-28: 18:00:00 via INTRAVENOUS

## 2013-01-27 MED ORDER — DEXAMETHASONE SODIUM PHOSPHATE 4 MG/ML IJ SOLN
4 mg/mL | Freq: Once | INTRAMUSCULAR | Status: AC
Start: 2013-01-27 — End: 2013-01-28
  Administered 2013-01-28: 16:00:00 via INTRAVENOUS

## 2013-01-27 MED ORDER — SODIUM CHLORIDE 0.9 % IV
200 mg | Freq: Once | INTRAVENOUS | Status: AC
Start: 2013-01-27 — End: 2013-01-28
  Administered 2013-01-28: 18:00:00 via INTRAVENOUS

## 2013-01-27 MED FILL — DACARBAZINE 200 MG IV SOLUTION: 200 mg | INTRAVENOUS | Qty: 679

## 2013-01-27 MED FILL — DOXORUBICIN 10 MG/5 ML IV: 10 mg/5 mL | INTRAVENOUS | Qty: 22.5

## 2013-01-27 MED FILL — BLEOMYCIN 15 UNIT SOLUTION FOR INJECTION: 15 unit | INTRAMUSCULAR | Qty: 18

## 2013-01-27 MED FILL — DEXAMETHASONE SODIUM PHOSPHATE 4 MG/ML IJ SOLN: 4 mg/mL | INTRAMUSCULAR | Qty: 5

## 2013-01-27 MED FILL — VINBLASTINE 1 MG/ML IV: 1 mg/mL | INTRAVENOUS | Qty: 11

## 2013-01-28 MED ORDER — SODIUM CHLORIDE 0.9 % IJ SYRG
INTRAMUSCULAR | Status: DC | PRN
Start: 2013-01-28 — End: 2013-02-01
  Administered 2013-01-28 (×2): via INTRAVENOUS

## 2013-01-28 MED ORDER — HEPARIN LOCK FLUSH 100 UNIT/ML IV SOLN
100 unit/mL | INTRAVENOUS | Status: DC | PRN
Start: 2013-01-28 — End: 2013-02-01
  Administered 2013-01-28: 19:00:00 via INTRAVENOUS

## 2013-01-28 MED ORDER — SODIUM CHLORIDE 0.9 % IV
INTRAVENOUS | Status: DC
Start: 2013-01-28 — End: 2013-02-01
  Administered 2013-01-28: 16:00:00 via INTRAVENOUS

## 2013-01-28 MED FILL — ALOXI 0.25 MG/5 ML INTRAVENOUS SOLUTION: 0.25 mg/5 mL | INTRAVENOUS | Qty: 1

## 2013-01-28 MED FILL — HEPARIN LOCK FLUSH 100 UNIT/ML IV SOLN: 100 unit/mL | INTRAVENOUS | Qty: 5

## 2013-01-28 MED FILL — DIPHENHYDRAMINE HCL 50 MG/ML IJ SOLN: 50 mg/mL | INTRAMUSCULAR | Qty: 1

## 2013-01-28 MED FILL — SODIUM CHLORIDE 0.9 % IV: INTRAVENOUS | Qty: 250

## 2013-01-28 MED FILL — BD POSIFLUSH NORMAL SALINE 0.9 % INJECTION SYRINGE: INTRAMUSCULAR | Qty: 40

## 2013-01-28 NOTE — Progress Notes (Signed)
[facility] OPIC Progress Note    Date: January 28, 2013    Name: Melissa Leblanc              MRN: [id number]              DOB: September 05, 1965    [name] was assessed and education was provided.     [name]'s vitals were reviewed.  Visit Vitals   Item Reading   ??? BP 124/81   ??? Pulse 80   ??? Temp 98.6 ??F (37 ??C)   ??? Resp 16   ??? Ht 5\' 5"  (1.651 m)   ??? Wt 78.472 kg (173 lb)   ??? BMI 28.79 kg/m2   ??? Breastfeeding No       Lab results were obtained and reviewed.Hct 36.0, Hgb 12.1, platelets 138, Abs Neutrophils 4.9.    Chemotherapy Cycle: C5, D1.     Pre-medications were administered as ordered and chemotherapy was given as ordered.    [name] tolerated infusion, and had no complaints at this time.  Patient armband removed and shredded.    [name] was [facility] in stable condition at 1540. She is to return on 02/11/13 at 1900 for her next appointment fir chemo C5, D15.     Fransico Setters, RN  January 28, 2013

## 2013-02-10 MED ORDER — DEXAMETHASONE SODIUM PHOSPHATE 4 MG/ML IJ SOLN
4 mg/mL | Freq: Once | INTRAMUSCULAR | Status: AC
Start: 2013-02-10 — End: 2013-02-11

## 2013-02-10 MED ORDER — PALONOSETRON 0.25 MG/5 ML IV
0.25 mg/5 mL | Freq: Once | INTRAVENOUS | Status: AC
Start: 2013-02-10 — End: 2013-02-11

## 2013-02-10 MED ORDER — SODIUM CHLORIDE 0.9 % IV
15 unit | Freq: Once | INTRAVENOUS | Status: AC
Start: 2013-02-10 — End: 2013-02-11

## 2013-02-10 MED ORDER — DOXORUBICIN 10 MG/5 ML IV
105 mg/5 mL | Freq: Once | INTRAVENOUS | Status: AC
Start: 2013-02-10 — End: 2013-02-11

## 2013-02-10 MED ORDER — SODIUM CHLORIDE 0.9 % IV
200 mg | Freq: Once | INTRAVENOUS | Status: AC
Start: 2013-02-10 — End: 2013-02-11

## 2013-02-10 MED ORDER — DIPHENHYDRAMINE HCL 50 MG/ML IJ SOLN
50 mg/mL | Freq: Once | INTRAMUSCULAR | Status: AC
Start: 2013-02-10 — End: 2013-02-11

## 2013-02-10 MED ORDER — PEGFILGRASTIM 6 MG/0.6ML SC SYRG
6 mg/0. mL | Freq: Once | SUBCUTANEOUS | Status: AC
Start: 2013-02-10 — End: 2013-02-13

## 2013-02-10 MED ORDER — VINBLASTINE 1 MG/ML IV
1 mg/mL | Freq: Once | INTRAVENOUS | Status: AC
Start: 2013-02-10 — End: 2013-02-11

## 2013-02-11 MED ORDER — HEPARIN LOCK FLUSH 100 UNIT/ML IV SOLN
100 unit/mL | INTRAVENOUS | Status: AC | PRN
Start: 2013-02-11 — End: 2013-02-12

## 2013-02-11 MED ORDER — SODIUM CHLORIDE 0.9 % IJ SYRG
INTRAMUSCULAR | Status: DC | PRN
Start: 2013-02-11 — End: 2013-03-14

## 2013-02-11 MED ORDER — SODIUM CHLORIDE 0.9 % IV
INTRAVENOUS | Status: AC
Start: 2013-02-11 — End: 2013-02-12

## 2013-02-11 NOTE — Progress Notes (Signed)
Pt did not show up for today's appt and did not return phone messages left at 0935, 1000 and 1140.  Message left with pt contact, Loraine Leriche,  at 1146.  Ms Norton Blizzard called back at 1150 stating that Ms Vanderloop has decided to stop treatment and that Ms Soohoo was not present to talk with me.  I encouraged Ms Norton Blizzard to have Ms Petrosky please call us expressing our concern for her.  Ms Fernicola has an appointment with Dr Alanda Slim tomorrow.

## 2013-02-15 NOTE — Telephone Encounter (Signed)
Patient missed appt with the Opic side.  Also patient needs appt with Dr. Alanda Slim too.  Please call her to set-up.

## 2013-02-23 NOTE — Progress Notes (Signed)
We were informed today, Tuesday, 02-23-13, by Audry Riles, that Ms. Delawder's insurance has now changed to [employer].  And, since this change has occurred, she will no longer be able to receive her chemotherapy in the [facility], and instead, will have to be changed to a hospital site, which will likely be [facility]. The changes will be made, and Ms. Ellingwood will be notified accordingly, per Audry Riles, and Charlcie Cradle, the [place], at [facility]

## 2013-02-24 MED ORDER — VINBLASTINE 1 MG/ML IV
1 mg/mL | Freq: Once | INTRAVENOUS | Status: AC
Start: 2013-02-24 — End: 2013-02-25
  Administered 2013-02-25: 19:00:00 via INTRAVENOUS

## 2013-02-24 MED ORDER — SODIUM CHLORIDE 0.9 % IV
4 mg/mL | Freq: Once | INTRAVENOUS | Status: AC
Start: 2013-02-24 — End: 2013-02-25
  Administered 2013-02-25: 16:00:00 via INTRAVENOUS

## 2013-02-24 MED ORDER — DIPHENHYDRAMINE HCL 50 MG/ML IJ SOLN
50 mg/mL | Freq: Once | INTRAMUSCULAR | Status: AC
Start: 2013-02-24 — End: 2013-02-25
  Administered 2013-02-25: 16:00:00 via INTRAVENOUS

## 2013-02-24 MED ORDER — SODIUM CHLORIDE 0.9 % IV
105 mg/5 mL | Freq: Once | INTRAVENOUS | Status: AC
Start: 2013-02-24 — End: 2013-02-25
  Administered 2013-02-25: 17:00:00 via INTRAVENOUS

## 2013-02-24 MED ORDER — ONDANSETRON HCL 2 MG/ML IV
2 mg/mL | Freq: Once | INTRAVENOUS | Status: AC
Start: 2013-02-24 — End: 2013-02-25
  Administered 2013-02-25: 15:00:00 via INTRAVENOUS

## 2013-02-24 MED ORDER — DACARBAZINE 200 MG IV SOLUTION
200 mg | Freq: Once | INTRAVENOUS | Status: AC
Start: 2013-02-24 — End: 2013-02-25
  Administered 2013-02-25: 19:00:00 via INTRAVENOUS

## 2013-02-24 MED ORDER — SODIUM CHLORIDE 0.9 % IV
15 unit | Freq: Once | INTRAVENOUS | Status: AC
Start: 2013-02-24 — End: 2013-02-25
  Administered 2013-02-25: 18:00:00 via INTRAVENOUS

## 2013-02-24 MED FILL — DEXAMETHASONE SODIUM PHOSPHATE 4 MG/ML IJ SOLN: 4 mg/mL | INTRAMUSCULAR | Qty: 5

## 2013-02-24 MED FILL — VINBLASTINE 1 MG/ML IV: 1 mg/mL | INTRAVENOUS | Qty: 11

## 2013-02-24 MED FILL — BLEOMYCIN 15 UNIT SOLUTION FOR INJECTION: 15 unit | INTRAMUSCULAR | Qty: 18

## 2013-02-24 MED FILL — DACARBAZINE 200 MG IV SOLUTION: 200 mg | INTRAVENOUS | Qty: 679

## 2013-02-24 MED FILL — ONDANSETRON HCL 2 MG/ML IV: 2 mg/mL | INTRAVENOUS | Qty: 8

## 2013-02-24 MED FILL — DOXORUBICIN 10 MG/5 ML IV: 10 mg/5 mL | INTRAVENOUS | Qty: 22.5

## 2013-02-25 LAB — CBC WITH AUTOMATED DIFF
ABS. BASOPHILS: 0 10*3/uL (ref 0.0–0.1)
ABS. EOSINOPHILS: 0 10*3/uL (ref 0.0–0.4)
ABS. LYMPHOCYTES: 0.9 10*3/uL (ref 0.9–3.6)
ABS. MONOCYTES: 0.4 10*3/uL (ref 0.05–1.2)
ABS. NEUTROPHILS: 1.6 10*3/uL — ABNORMAL LOW (ref 1.8–8.0)
BASOPHILS: 0 % (ref 0–2)
EOSINOPHILS: 1 % (ref 0–5)
HCT: 36 % (ref 35.0–45.0)
HGB: 12.1 g/dL (ref 12.0–16.0)
LYMPHOCYTES: 30 % (ref 21–52)
MCH: 27.8 PG (ref 24.0–34.0)
MCHC: 33.6 g/dL (ref 31.0–37.0)
MCV: 82.8 FL (ref 74.0–97.0)
MONOCYTES: 14 % — ABNORMAL HIGH (ref 3–10)
MPV: 10.7 FL (ref 9.2–11.8)
NEUTROPHILS: 55 % (ref 40–73)
PLATELET: 174 10*3/uL (ref 135–420)
RBC: 4.35 M/uL (ref 4.20–5.30)
RDW: 17.7 % — ABNORMAL HIGH (ref 11.6–14.5)
WBC: 2.9 10*3/uL — ABNORMAL LOW (ref 4.6–13.2)

## 2013-02-25 MED ORDER — SODIUM CHLORIDE 0.9 % IV
INTRAVENOUS | Status: DC
Start: 2013-02-25 — End: 2013-03-01
  Administered 2013-02-25: 15:00:00 via INTRAVENOUS

## 2013-02-25 MED ORDER — SODIUM CHLORIDE 0.9 % IJ SYRG
INTRAMUSCULAR | Status: DC | PRN
Start: 2013-02-25 — End: 2013-03-01
  Administered 2013-02-25 (×2): via INTRAVENOUS

## 2013-02-25 MED ORDER — HEPARIN LOCK FLUSH 100 UNIT/ML IV SOLN
100 unit/mL | INTRAVENOUS | Status: DC | PRN
Start: 2013-02-25 — End: 2013-03-01
  Administered 2013-02-25: 20:00:00

## 2013-02-25 MED FILL — SODIUM CHLORIDE 0.9 % IV: INTRAVENOUS | Qty: 1000

## 2013-02-25 MED FILL — DIPHENHYDRAMINE HCL 50 MG/ML IJ SOLN: 50 mg/mL | INTRAMUSCULAR | Qty: 1

## 2013-02-25 MED FILL — BD POSIFLUSH NORMAL SALINE 0.9 % INJECTION SYRINGE: INTRAMUSCULAR | Qty: 40

## 2013-02-25 MED FILL — HEPARIN LOCK FLUSH 100 UNIT/ML IV SOLN: 100 unit/mL | INTRAVENOUS | Qty: 5

## 2013-02-25 NOTE — Progress Notes (Signed)
Quick Note:    Labs reviewed and noted.  ______

## 2013-02-25 NOTE — Progress Notes (Signed)
[facility] OPIC Progress Note    Date: February 25, 2013    Name: Melissa Leblanc    MRN: [id number]         DOB: April 18, 1966      Patient assessed and education was provided.     Patient vitals were reviewed.  Visit Vitals   Item Reading   ??? BP 160/87   ??? Pulse 73   ??? Temp 98.6 ??F (37 ??C)   ??? Resp 18   ??? Ht 5\' 5"  (1.651 m)   ??? Wt 79.516 kg (175 lb 4.8 oz)   ??? BMI 29.17 kg/m2   ??? SpO2 98%   ??? Breastfeeding No   medi-port accessed.blood sample obtained    Lab results were obtained and reviewed.  Recent Results (from the past 12 hour(s))   CBC WITH AUTOMATED DIFF    Collection Time     02/25/13 10:33 AM       Result Value Range    WBC 2.9 (*) 4.6 - 13.2 K/uL    RBC 4.35  4.20 - 5.30 M/uL    HGB 12.1  12.0 - 16.0 g/dL    HCT 82.9  56.2 - 13.0 %    MCV 82.8  74.0 - 97.0 FL    MCH 27.8  24.0 - 34.0 PG    MCHC 33.6  31.0 - 37.0 g/dL    RDW 86.5 (*) 78.4 - 14.5 %    PLATELET 174  135 - 420 K/uL    MPV 10.7  9.2 - 11.8 FL    NEUTROPHILS 55  40 - 73 %    LYMPHOCYTES 30  21 - 52 %    MONOCYTES 14 (*) 3 - 10 %    EOSINOPHILS 1  0 - 5 %    BASOPHILS 0  0 - 2 %    ABS. NEUTROPHILS 1.6 (*) 1.8 - 8.0 K/UL    ABS. LYMPHOCYTES 0.9  0.9 - 3.6 K/UL    ABS. MONOCYTES 0.4  0.05 - 1.2 K/UL    ABS. EOSINOPHILS 0.0  0.0 - 0.4 K/UL    ABS. BASOPHILS 0.0  0.0 - 0.1 K/UL    DF AUTOMATED         Pre-medications were administered as ordered and chemotherapy was initiated.     adriamycin was infused as per mar.tolerated well.+ blood returns.  Bleomycin infused as per mar.tolerated well.+blood returns.  Vinblastine infused as per mar.tolerated well.+blood returns.  Dacarbazine infused as per mar.tolerated well.+flush/blood returns from port.port de-accessed.site s s/s.gauze applied and taped down.  Patient Vitals for the past 8 hrs:   Temp Pulse Resp BP SpO2   02/25/13 1604 98.6 ??F (37 ??C) 73 18 160/87 mmHg 98 %   02/25/13 1429 98.8 ??F (37.1 ??C) 71 18 136/84 mmHg -   02/25/13 1024 98.3 ??F (36.8 ??C) 70 18 132/83 mmHg 99 %         [facility] condition at 1615  She is to return on 03/11/13 at 0900 for her next appointment.    GENEVA  RAMSAWH  February 25, 2013  4:24 PM

## 2013-02-28 LAB — METABOLIC PANEL, COMPREHENSIVE
A-G Ratio: 0.9 (ref 0.8–1.7)
ALT (SGPT): 30 U/L (ref 12.0–78.0)
AST (SGOT): 24 U/L (ref 15–37)
Albumin: 4.3 g/dL (ref 3.4–5.0)
Alk. phosphatase: 66 U/L (ref 45–117)
Anion gap: 7 mmol/L (ref 3.0–18)
BUN/Creatinine ratio: 14 (ref 12–20)
BUN: 11 MG/DL (ref 7.0–18)
Bilirubin, total: 0.5 MG/DL (ref 0.2–1.0)
CO2: 26 mmol/L (ref 21–32)
Calcium: 9.5 MG/DL (ref 8.5–10.1)
Chloride: 110 mmol/L — ABNORMAL HIGH (ref 100–108)
Creatinine: 0.78 MG/DL (ref 0.6–1.3)
GFR est AA: >60 mL/min/{1.73_m2} (ref >60)
GFR est non-AA: >60 mL/min/{1.73_m2} (ref >60)
Globulin: 4.6 g/dL — ABNORMAL HIGH (ref 2.0–4.0)
Glucose: 100 mg/dL — ABNORMAL HIGH (ref 74–99)
Potassium: 4.3 mmol/L (ref 3.5–5.5)
Protein, total: 8.9 g/dL — ABNORMAL HIGH (ref 6.4–8.2)
Sodium: 143 mmol/L (ref 136–145)

## 2013-02-28 LAB — EKG, 12 LEAD, INITIAL
Atrial Rate: 82 {beats}/min
Calculated P Axis: 63 degrees
Calculated R Axis: 23 degrees
Calculated T Axis: 32 degrees
Diagnosis: NORMAL
P-R Interval: 134 ms
Q-T Interval: 386 ms
QRS Duration: 78 ms
QTC Calculation (Bezet): 450 ms
Ventricular Rate: 82 {beats}/min

## 2013-02-28 LAB — CBC WITH AUTOMATED DIFF
ABS. BASOPHILS: 0 10*3/uL (ref 0.0–0.1)
ABS. EOSINOPHILS: 0 10*3/uL (ref 0.0–0.4)
ABS. LYMPHOCYTES: 0.6 10*3/uL — ABNORMAL LOW (ref 0.9–3.6)
ABS. MONOCYTES: 0.2 10*3/uL (ref 0.05–1.2)
ABS. NEUTROPHILS: 3.4 10*3/uL (ref 1.8–8.0)
BASOPHILS: 0 % (ref 0–2)
EOSINOPHILS: 0 % (ref 0–5)
HCT: 41.5 % (ref 35.0–45.0)
HGB: 14.2 g/dL (ref 12.0–16.0)
LYMPHOCYTES: 14 % — ABNORMAL LOW (ref 21–52)
MCH: 28 PG (ref 24.0–34.0)
MCHC: 34.2 g/dL (ref 31.0–37.0)
MCV: 81.9 FL (ref 74.0–97.0)
MONOCYTES: 5 % (ref 3–10)
MPV: 10.7 FL (ref 9.2–11.8)
NEUTROPHILS: 81 % — ABNORMAL HIGH (ref 40–73)
PLATELET: 170 10*3/uL (ref 135–420)
RBC: 5.07 M/uL (ref 4.20–5.30)
RDW: 17.6 % — ABNORMAL HIGH (ref 11.6–14.5)
WBC: 4.2 10*3/uL — ABNORMAL LOW (ref 4.6–13.2)

## 2013-02-28 LAB — LIPASE: Lipase: 99 U/L (ref 73–393)

## 2013-02-28 MED ORDER — MORPHINE 4 MG/ML SYRINGE
4 mg/mL | INTRAMUSCULAR | Status: AC
Start: 2013-02-28 — End: 2013-02-28
  Administered 2013-02-28: 22:00:00 via INTRAVENOUS

## 2013-02-28 MED ORDER — HYDROCODONE-ACETAMINOPHEN 5 MG-325 MG TAB
5-325 mg | ORAL_TABLET | ORAL | Status: DC | PRN
Start: 2013-02-28 — End: 2013-03-19

## 2013-02-28 MED ORDER — IOVERSOL 320 MG/ML IV SOLN
320 mg iodine/mL | Freq: Once | INTRAVENOUS | Status: AC
Start: 2013-02-28 — End: 2013-02-28
  Administered 2013-02-28: 22:00:00 via INTRAVENOUS

## 2013-02-28 MED FILL — OPTIRAY 320 MG IODINE/ML INTRAVENOUS SOLUTION: 320 mg iodine/mL | INTRAVENOUS | Qty: 100

## 2013-02-28 MED FILL — MORPHINE 4 MG/ML SYRINGE: 4 mg/mL | INTRAMUSCULAR | Qty: 1

## 2013-02-28 NOTE — ED Notes (Addendum)
Pt is a/ox4 arriving today by EMS with reports of left upper quadrant abd pain with n/v that started this am , pt reports that she has lymphoma and is receiving chemo

## 2013-02-28 NOTE — ED Notes (Signed)
Dr Charlett Nose in to see pt at this time.

## 2013-02-28 NOTE — ED Provider Notes (Addendum)
HPI Comments: Melissa Leblanc is a 47 y.o. Female with a pmhx of Lymphoma, Diabetes, HTN, and GERD presents to the ED c/o epigastric pain, nausea, and vomiting with onset in the morning 7/13. Pt states she receives chemotherapy treatment for her cancer approximately every 8-15 days, and she states she received her last treatment 7/10. Pt states "something like this has happened before, but it never lasted this long." Pt's relative reported that the pt has felt hot and cold. Pt denies CP, SOB, and diarrhea. No other symptoms or complaints were presented at this time.       Patient is a 47 y.o. female presenting with abdominal pain, nausea, and vomiting. The history is provided by the patient and a relative.   Abdominal Pain   Associated symptoms include nausea and vomiting. Pertinent negatives include no diarrhea and no chest pain.   Nausea   Associated symptoms include abdominal pain. Pertinent negatives include no diarrhea.   Vomiting   Associated symptoms include abdominal pain. Pertinent negatives include no diarrhea.        Past Medical History   Diagnosis Date   ??? Hypertension    ??? Prediabetes    ??? VAIN I (vaginal intraepithelial neoplasia grade I)    ??? Anxiety    ??? Lymphoma    ??? Neck mass    ??? Cancer      lymph node cancer   ??? Diabetes      border line diabetes   ??? GERD (gastroesophageal reflux disease)    ??? Asthma         Past Surgical History   Procedure Laterality Date   ??? Hx gyn       BTL   ??? Hx hysterectomy  2004   ??? Hx other surgical       lymph node biopsies neck         Family History   Problem Relation Age of Onset   ??? Cancer Mother    ??? Breast Cancer Mother    ??? Cancer Father    ??? Alcohol abuse Neg Hx    ??? Arthritis-rheumatoid Neg Hx    ??? Asthma Neg Hx    ??? Bleeding Prob Neg Hx    ??? Diabetes Neg Hx    ??? Elevated Lipids Neg Hx    ??? Headache Neg Hx    ??? Heart Disease Neg Hx    ??? Hypertension Neg Hx    ??? Lung Disease Neg Hx    ??? Migraines Neg Hx    ??? Psychiatric Disorder Neg Hx    ??? Stroke Neg Hx    ???  Mental Retardation Neg Hx         History     Social History   ??? Marital Status: SINGLE     Spouse Name: N/A     Number of Children: N/A   ??? Years of Education: N/A     Occupational History   ??? Not on file.     Social History Main Topics   ??? Smoking status: Current Every Day Smoker -- 0.25 packs/day for 25 years   ??? Smokeless tobacco: Never Used   ??? Alcohol Use: 3.0 oz/week     6 Cans of beer per week      Comment: every 3 to 4 days   ??? Drug Use: Yes     Special: Marijuana      Comment: one or 2 puffs occasionally   ??? Sexually Active: No  Other Topics Concern   ??? Not on file     Social History Narrative   ??? No narrative on file                  ALLERGIES: Review of patient's allergies indicates no known allergies.      Review of Systems   Constitutional: Negative.    HENT: Negative.    Eyes: Negative.    Respiratory: Negative.  Negative for shortness of breath.    Cardiovascular: Negative.  Negative for chest pain.   Gastrointestinal: Positive for nausea, vomiting and abdominal pain. Negative for diarrhea.   Endocrine: Negative.    Genitourinary: Negative.    Musculoskeletal: Negative.    Skin: Negative.    Allergic/Immunologic: Negative.    Neurological: Negative.    Hematological: Negative.    Psychiatric/Behavioral: Negative.    All other systems reviewed and are negative.        Filed Vitals:    02/28/13 1547 02/28/13 1600 02/28/13 1604   BP: 142/121 144/94 120/74   Pulse: 87 120 100   Temp: 98.2 ??F (36.8 ??C)     Resp: 16 21 17    Height: 5\' 5"  (1.651 m)     Weight: 71.215 kg (157 lb)     SpO2: 99% 98% 99%            Physical Exam   Constitutional: She is oriented to person, place, and time. She appears well-developed.   HENT:   Head: Normocephalic and atraumatic.   Eyes: EOM are normal. Pupils are equal, round, and reactive to light.   Neck: Normal range of motion. Neck supple.   Cardiovascular: Normal rate, regular rhythm and normal heart sounds.  Exam reveals no friction rub.    No murmur heard.   Pulmonary/Chest: Effort normal and breath sounds normal. No respiratory distress. She has no wheezes.   Abdominal: Soft. She exhibits no distension. There is tenderness. There is no rebound and no guarding.   Epigastric ; no rebound   Neg murphy and mcburney     Musculoskeletal: Normal range of motion.   Neurological: She is alert and oriented to person, place, and time.   Skin: Skin is warm and dry.   Psychiatric: She has a normal mood and affect. Her behavior is normal. Thought content normal.        MDM     Differential Diagnosis; Clinical Impression; Plan:      EKG: Normal sinus at 82, normal axis, normal intervals, no ST elevation or depression, no hypertrophy.      47 year old female with lymphoma presents with epigastric pain times one day associated with vomiting.  No diarrhea.    Ct negative    D/w d.r Alanda Slim, agree with dc/ follow up with him next week.       Procedures    Scribe Attestation:   Rosalio Macadamia 02/28/2013 4:17 PM scribing for and in the presence of Dr.Khiyan Crace Lorie Apley, MD     Rosalio Macadamia, Scribe       Provider Attestation:   I personally performed the services described in the documentation, reviewed the documentation, as recorded by the scribe in my presence, and it accurately and completely records my words and actions. February 28, 2013 at 7:24 PM - Algis Downs, MD

## 2013-02-28 NOTE — ED Notes (Signed)
Verbal shift change report given to Erick RN (oncoming nurse) by Madysen RN (offgoing nurse).  Report given with SBAR, ED Summary, Procedure Summary, MAR and Recent Results.

## 2013-02-28 NOTE — ED Notes (Signed)
I have reviewed discharge instructions with the patient.  The patient verbalized understanding.Patient armband removed and shredded

## 2013-02-28 NOTE — ED Notes (Signed)
Pt is noted for a mediport to her left upper chest

## 2013-02-28 NOTE — ED Notes (Signed)
Pt resting at this time with family at bedside, pt asking for another ginger ale at this time , pt given encouragement and support. Will continue to monitor and support.

## 2013-02-28 NOTE — ED Notes (Addendum)
Pt is a/ox4 ambulatory, pt c/o left upper abd pain with nausea and vomiting that started this am, pt reports that she is unable to keep any of her medications down,pt reports that she last ate at 9pm,  pt denies cp, pt denies sob, pt denies diarrhea, pt denies any recent injury and illness ,pt denies dizziness, pt reports history of lymphoma currently receiving chemo noting she has received #6 part 1. Pt denies any difficulty with her bowel or bladder, plan of care discussed questions encouraged.

## 2013-02-28 NOTE — ED Notes (Signed)
Pt noted to ask ED staff for something to eat Dr Charlett Nose gave verbal order to go ahead at this time. Pt given crackers.

## 2013-02-28 NOTE — ED Notes (Signed)
Pt able to eat crackers without complications family at bedside will continue to monitor and support.

## 2013-03-03 NOTE — Progress Notes (Signed)
Melissa Leblanc is a 47 y.o. Female with a pmhx of Lymphoma, Diabetes, HTN, and GERD presented to [facility] ED on 02/28/13 c/o epigastric pain, nausea, and vomiting with onset in the morning 7/13. Pt she receives chemotherapy treatment for her cancer approximately every 8-15 days, and received last treatment on 02/25/13.  Telephone call to patient who reported that she is feeling much better today. Reviewed plan of care. Patient has provided input to plan and agrees with goals.

## 2013-03-10 MED ORDER — SODIUM CHLORIDE 0.9 % IV
200 mg | Freq: Once | INTRAVENOUS | Status: AC
Start: 2013-03-10 — End: 2013-03-11
  Administered 2013-03-11: 18:00:00 via INTRAVENOUS

## 2013-03-10 MED ORDER — DIPHENHYDRAMINE HCL 50 MG/ML IJ SOLN
50 mg/mL | Freq: Once | INTRAMUSCULAR | Status: AC
Start: 2013-03-10 — End: 2013-03-11
  Administered 2013-03-11: 15:00:00 via INTRAVENOUS

## 2013-03-10 MED ORDER — SODIUM CHLORIDE 0.9 % IV
1 mg/mL | Freq: Once | INTRAVENOUS | Status: AC
Start: 2013-03-10 — End: 2013-03-11
  Administered 2013-03-11: 18:00:00 via INTRAVENOUS

## 2013-03-10 MED ORDER — SODIUM CHLORIDE 0.9 % IV
105 mg/5 mL | Freq: Once | INTRAVENOUS | Status: AC
Start: 2013-03-10 — End: 2013-03-11
  Administered 2013-03-11: 17:00:00 via INTRAVENOUS

## 2013-03-10 MED ORDER — SODIUM CHLORIDE 0.9 % IV
15 unit | Freq: Once | INTRAVENOUS | Status: AC
Start: 2013-03-10 — End: 2013-03-11
  Administered 2013-03-11: 17:00:00 via INTRAVENOUS

## 2013-03-10 MED ORDER — DEXAMETHASONE SODIUM PHOSPHATE 4 MG/ML IJ SOLN
4 mg/mL | Freq: Once | INTRAMUSCULAR | Status: AC
Start: 2013-03-10 — End: 2013-03-11
  Administered 2013-03-11: 15:00:00 via INTRAVENOUS

## 2013-03-10 MED ORDER — PEGFILGRASTIM 6 MG/0.6ML SC SYRG
6 mg/0. mL | Freq: Once | SUBCUTANEOUS | Status: AC
Start: 2013-03-10 — End: 2013-03-11

## 2013-03-10 MED ORDER — ONDANSETRON HCL 2 MG/ML IV
2 mg/mL | Freq: Once | INTRAVENOUS | Status: AC
Start: 2013-03-10 — End: 2013-03-11
  Administered 2013-03-11: 14:00:00 via INTRAVENOUS

## 2013-03-11 ENCOUNTER — Encounter

## 2013-03-11 LAB — CBC WITH AUTOMATED DIFF
ABS. BASOPHILS: 0 10*3/uL (ref 0.0–0.06)
ABS. EOSINOPHILS: 0 10*3/uL (ref 0.0–0.4)
ABS. LYMPHOCYTES: 0.8 10*3/uL — ABNORMAL LOW (ref 0.9–3.6)
ABS. MONOCYTES: 0.5 10*3/uL (ref 0.05–1.2)
ABS. NEUTROPHILS: 1.1 10*3/uL — ABNORMAL LOW (ref 1.8–8.0)
BASOPHILS: 0 % (ref 0–2)
EOSINOPHILS: 1 % (ref 0–5)
HCT: 34.5 % — ABNORMAL LOW (ref 35.0–45.0)
HGB: 11.6 g/dL — ABNORMAL LOW (ref 12.0–16.0)
LYMPHOCYTES: 33 % (ref 21–52)
MCH: 27.5 PG (ref 24.0–34.0)
MCHC: 33.6 g/dL (ref 31.0–37.0)
MCV: 81.8 FL (ref 74.0–97.0)
MONOCYTES: 21 % — ABNORMAL HIGH (ref 3–10)
MPV: 9.6 FL (ref 9.2–11.8)
NEUTROPHILS: 45 % (ref 40–73)
PLATELET: 183 10*3/uL (ref 135–420)
RBC: 4.22 M/uL (ref 4.20–5.30)
RDW: 17.5 % — ABNORMAL HIGH (ref 11.6–14.5)
WBC: 2.4 10*3/uL — ABNORMAL LOW (ref 4.6–13.2)

## 2013-03-11 MED ORDER — PEGFILGRASTIM 6 MG/0.6ML SC SYRG
6 mg/0. mL | Freq: Once | SUBCUTANEOUS | Status: AC
Start: 2013-03-11 — End: 2013-03-12
  Administered 2013-03-12: 20:00:00 via SUBCUTANEOUS

## 2013-03-11 MED ORDER — SODIUM CHLORIDE 0.9 % IV
INTRAVENOUS | Status: DC
Start: 2013-03-11 — End: 2013-03-15
  Administered 2013-03-11: 14:00:00 via INTRAVENOUS

## 2013-03-11 MED ORDER — SODIUM CHLORIDE 0.9 % IJ SYRG
INTRAMUSCULAR | Status: DC | PRN
Start: 2013-03-11 — End: 2013-03-15
  Administered 2013-03-11 (×2): via INTRAVENOUS

## 2013-03-11 MED ORDER — HEPARIN LOCK FLUSH 100 UNIT/ML IV SOLN
100 unit/mL | INTRAVENOUS | Status: DC | PRN
Start: 2013-03-11 — End: 2013-03-15
  Administered 2013-03-11: 20:00:00

## 2013-03-11 NOTE — Progress Notes (Signed)
Quick Note:    Results reviewed and noted.  ______

## 2013-03-11 NOTE — Progress Notes (Signed)
[facility] OPIC Progress Note    Date: March 11, 2013    Name: Melissa Leblanc    MRN: [id number]         DOB: 09-28-1965      Patient assessed and education was provided.     Patient vitals were reviewed.  Visit Vitals   Item Reading   ??? BP 115/78   ??? Pulse 96   ??? Temp 98.1 ??F (36.7 ??C)   ??? Resp 18   ??? Ht 5\' 5"  (1.651 m)   ??? Wt 79.289 kg (174 lb 12.8 oz)   ??? BMI 29.09 kg/m2   ??? SpO2 99%   upper medi-port accessed.blood sample obtained    Lab results were obtained and reviewed.  Recent Results (from the past 12 hour(s))   CBC WITH AUTOMATED DIFF    Collection Time     03/11/13  9:15 AM       Result Value Range    WBC 2.4 (*) 4.6 - 13.2 K/uL    RBC 4.22  4.20 - 5.30 M/uL    HGB 11.6 (*) 12.0 - 16.0 g/dL    HCT 11.9 (*) 14.7 - 45.0 %    MCV 81.8  74.0 - 97.0 FL    MCH 27.5  24.0 - 34.0 PG    MCHC 33.6  31.0 - 37.0 g/dL    RDW 82.9 (*) 56.2 - 14.5 %    PLATELET 183  135 - 420 K/uL    MPV 9.6  9.2 - 11.8 FL    NEUTROPHILS 45  40 - 73 %    LYMPHOCYTES 33  21 - 52 %    MONOCYTES 21 (*) 3 - 10 %    EOSINOPHILS 1  0 - 5 %    BASOPHILS 0  0 - 2 %    ABS. NEUTROPHILS 1.1 (*) 1.8 - 8.0 K/UL    ABS. LYMPHOCYTES 0.8 (*) 0.9 - 3.6 K/UL    ABS. MONOCYTES 0.5  0.05 - 1.2 K/UL    ABS. EOSINOPHILS 0.0  0.0 - 0.4 K/UL    ABS. BASOPHILS 0.0  0.0 - 0.06 K/UL    DF AUTOMATED       Kelly plasse np notified of above labs.okay to proceed with chemo  Pre-medications were administered as ordered and chemotherapy was initiated.     adriamycin was infused as per mar.tolerated well.+ blood returns.  Bleomycin infused as per mar.tolerated well.+blood returns.  Vinblastine infused as per mar.tolerated well.+blood returns.  Dacarbazine infused as per mar.tolerated well.+flush/blood returns from port.port de-accessed.site s s/s.gauze applied and taped down.  Patient Vitals for the past 8 hrs:   Temp Pulse Resp BP SpO2   03/11/13 1535 98.1 ??F (36.7 ??C) 96 18 115/78 mmHg 99 %   03/11/13 1400 97.9 ??F (36.6 ??C) 90 16 121/83 mmHg -   03/11/13 0856 99.1 ??F (37.3 ??C) 87  18 112/82 mmHg -         [facility] in stable condition at 1545  She is to return on 03/12/13 at 330 pm for her next appointment.    GENEVA  RAMSAWH  March 11, 2013  4:24 PM

## 2013-03-12 NOTE — Progress Notes (Signed)
[facility] OPIC Progress Note    Date: March 12, 2013    Name: Melissa Leblanc    MRN: [id number]         DOB: 01/31/1966      [name] arrived to [facility] at 1530 pm.    [name] was assessed and education was provided.     [name]'s vitals were reviewed.  Visit Vitals   Item Reading   ??? BP 144/91   ??? Pulse 86   ??? Temp 99 ??F (37.2 ??C)   ??? Resp 18           Nuelasta was administered as ordered SQ in patient's right upper arm ( right).    [name] tolerated well without complaints.    [name] was [facility] in stable condition at 1539 pm. She is to return on 04/09/2013 at 1030 am for her next appointment.    Katherina Right, RN  March 12, 2013  3:44 PM

## 2013-03-19 ENCOUNTER — Telehealth

## 2013-03-19 MED ORDER — OXYCODONE-ACETAMINOPHEN 7.5 MG-325 MG TAB
ORAL_TABLET | Freq: Four times a day (QID) | ORAL | Status: DC | PRN
Start: 2013-03-19 — End: 2013-05-25

## 2013-03-19 NOTE — Telephone Encounter (Signed)
Pt notified refill ready.

## 2013-03-30 LAB — AMB POC URINALYSIS DIP STICK AUTO W/O MICRO
Bilirubin (UA POC): NEGATIVE
Blood (UA POC): NEGATIVE
Glucose (UA POC): NEGATIVE
Ketones (UA POC): NEGATIVE
Leukocyte esterase (UA POC): NEGATIVE
Nitrites (UA POC): NEGATIVE
Protein (UA POC): NEGATIVE mg/dL
Specific gravity (UA POC): 1.01 (ref 1.001–1.035)
Urobilinogen (UA POC): 0.2 (ref 0.2–1)
pH (UA POC): 7 (ref 4.6–8.0)

## 2013-03-30 MED ORDER — LISINOPRIL-HYDROCHLOROTHIAZIDE 20 MG-25 MG TAB
20-25 mg | ORAL_TABLET | Freq: Every day | ORAL | Status: DC
Start: 2013-03-30 — End: 2013-09-29

## 2013-03-30 NOTE — Patient Instructions (Signed)
Urge Incontinence in Women: After Your Visit  Your Care Instructions  Urge incontinence occurs when the need to urinate is so strong that you cannot reach the toilet in time, even when your bladder contains only a small amount of urine. This is also called overactive bladder or unstable bladder. Some women may have no warning before they leak urine. This condition does not cause major health problems, but it can be embarrassing and can affect a woman's self-esteem and confidence.  Treatment can cure or improve your symptoms.  Follow-up care is a key part of your treatment and safety. Be sure to make and go to all appointments, and call your doctor if you are having problems. It's also a good idea to know your test results and keep a list of the medicines you take.  How can you care for yourself at home?  ?? Take your medicines exactly as prescribed. Call your doctor if you think you are having a problem with your medicine. You will get more details on the specific medicines your doctor prescribes.  ?? Limit caffeine and alcohol???they stimulate urine production.  ?? Urinate every 2 to 4 hours during waking hours, even if you feel that you do not have to go.  ?? Do pelvic floor (Kegel) exercises, which tighten and strengthen pelvic muscles. To do Kegel exercises:  ?? Squeeze the same muscles you would use to stop your urine. Your belly and thighs should not move.  ?? Hold the squeeze for 3 seconds, then relax for 3 seconds.  ?? Start with 3 seconds. Then add 1 second each week until you are able to squeeze for 10 seconds.  ?? Repeat the exercise 10 to 15 times for each session. Do three or more sessions each day.  ?? Try wearing pads that absorb the leaks.  ?? Keep skin in the genital area dry.  When should you call for help?  Call your doctor now or seek immediate medical care if:  ?? You have symptoms of a urinary infection. For example:  ?? You have blood or pus in your urine.  ?? You have pain in your back just below your rib  cage. This is called flank pain.  ?? You have a fever, chills, or body aches.  ?? It hurts to urinate.  ?? You have groin or belly pain.  Watch closely for changes in your health, and be sure to contact your doctor if:  ?? You have a hard time urinating when your bladder feels full.  ?? You feel like you need to urinate often.  ?? Your bladder feels full even after you urinate.  ?? Your symptoms are getting worse.   Where can you learn more?   Go to http://www.healthwise.net/[facility]  Enter U825 in the search box to learn more about "Urge Incontinence in Women: After Your Visit."   ?? 2006-2014 [employer]. Care instructions adapted under license by [facility] (which disclaims liability or warranty for this information). This care instruction is for use with your licensed healthcare professional. If you have questions about a medical condition or this instruction, always ask your healthcare professional. Healthwise, Incorporated disclaims any warranty or liability for your use of this information.  Content Version: 10.1.311062; Current as of: March 24, 2012
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
## 2013-04-02 LAB — CBC WITH 3 PART DIFF
ABS. LYMPHOCYTES: 1.4 10*3/uL (ref 1.1–5.9)
ABS. MIXED CELLS: 0.5 10*3/uL (ref 0.0–2.3)
ABS. NEUTROPHILS: 2.2 10*3/uL (ref 1.8–9.5)
HCT: 38.9 % (ref 36–48)
HGB: 12.8 g/dL (ref 12.0–16.0)
LYMPHOCYTES: 35 % (ref 14–44)
MCH: 27.7 PG (ref 25.0–35.0)
MCHC: 32.9 g/dL (ref 31–37)
MCV: 84.2 FL (ref 78–102)
Mixed cells: 13 % (ref 0.1–17)
NEUTROPHILS: 53 % (ref 40–70)
PLATELET: 247 10*3/uL (ref 140–440)
RBC: 4.62 M/uL (ref 4.10–5.10)
RDW: 18.9 % — ABNORMAL HIGH (ref 11.5–14.5)
WBC: 4.1 10*3/uL — ABNORMAL LOW (ref 4.5–13.0)

## 2013-04-02 NOTE — Progress Notes (Signed)
Quick Note:    Labs reviewed and noted.  ______
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
## 2013-04-03 LAB — METABOLIC PANEL, COMPREHENSIVE
A-G Ratio: 1.3 (ref 1.1–2.5)
ALT (SGPT): 13 IU/L (ref 0–32)
AST (SGOT): 22 IU/L (ref 0–40)
Albumin: 4 g/dL (ref 3.5–5.5)
Alk. phosphatase: 64 IU/L (ref 39–117)
BUN/Creatinine ratio: 16 (ref 9–23)
BUN: 11 mg/dL (ref 6–24)
Bilirubin, total: 0.2 mg/dL (ref 0.0–1.2)
CO2: 24 mmol/L (ref 18–29)
Calcium: 9.3 mg/dL (ref 8.7–10.2)
Chloride: 105 mmol/L (ref 97–108)
Creatinine: 0.7 mg/dL (ref 0.57–1.00)
GFR est AA: 121 mL/min/{1.73_m2} (ref >59)
GFR est non-AA: 105 mL/min/{1.73_m2} (ref >59)
GLOBULIN, TOTAL: 3 g/dL (ref 1.5–4.5)
Glucose: 98 mg/dL (ref 65–99)
Potassium: 4.6 mmol/L (ref 3.5–5.2)
Protein, total: 7 g/dL (ref 6.0–8.5)
Sodium: 140 mmol/L (ref 134–144)

## 2013-04-05 NOTE — Progress Notes (Signed)
Quick Note:    Results reviewed and noted.  ______

## 2013-04-15 ENCOUNTER — Encounter

## 2013-04-21 ENCOUNTER — Encounter

## 2013-04-21 MED ORDER — IOVERSOL 320 MG/ML IV SOLN
320 mg iodine/mL | Freq: Once | INTRAVENOUS | Status: AC
Start: 2013-04-21 — End: 2013-04-21
  Administered 2013-04-21: 18:00:00 via INTRAVENOUS

## 2013-05-07 LAB — CBC WITH 3 PART DIFF
ABS. LYMPHOCYTES: 1.4 10*3/uL (ref 1.1–5.9)
ABS. MIXED CELLS: 0.5 10*3/uL (ref 0.0–2.3)
ABS. NEUTROPHILS: 1.7 10*3/uL — ABNORMAL LOW (ref 1.8–9.5)
HCT: 38.2 % (ref 36–48)
HGB: 12.6 g/dL (ref 12.0–16.0)
LYMPHOCYTES: 39 % (ref 14–44)
MCH: 27.5 PG (ref 25.0–35.0)
MCHC: 33 g/dL (ref 31–37)
MCV: 83.4 FL (ref 78–102)
Mixed cells: 13 % (ref 0.1–17)
NEUTROPHILS: 48 % (ref 40–70)
PLATELET: 177 10*3/uL (ref 140–440)
RBC: 4.58 M/uL (ref 4.10–5.10)
RDW: 16 % — ABNORMAL HIGH (ref 11.5–14.5)
WBC: 3.6 10*3/uL — ABNORMAL LOW (ref 4.5–13.0)

## 2013-05-07 NOTE — Progress Notes (Signed)
Quick Note:    Labs reviewed and noted.  ______
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
## 2013-05-11 ENCOUNTER — Encounter

## 2013-05-11 LAB — METABOLIC PANEL, COMPREHENSIVE
A-G Ratio: 1.2 (ref 1.1–2.5)
ALT (SGPT): 12 IU/L (ref 0–32)
AST (SGOT): 19 IU/L (ref 0–40)
Albumin: 4 g/dL (ref 3.5–5.5)
Alk. phosphatase: 68 IU/L (ref 39–117)
BUN/Creatinine ratio: 8 — ABNORMAL LOW (ref 9–23)
BUN: 6 mg/dL (ref 6–24)
Bilirubin, total: 0.2 mg/dL (ref 0.0–1.2)
CO2: 22 mmol/L (ref 18–29)
Calcium: 9.5 mg/dL (ref 8.7–10.2)
Chloride: 101 mmol/L (ref 97–108)
Creatinine: 0.73 mg/dL (ref 0.57–1.00)
GFR est AA: 114 mL/min/{1.73_m2} (ref >59)
GFR est non-AA: 99 mL/min/{1.73_m2} (ref >59)
GLOBULIN, TOTAL: 3.3 g/dL (ref 1.5–4.5)
Glucose: 95 mg/dL (ref 65–99)
Potassium: 3.9 mmol/L (ref 3.5–5.2)
Protein, total: 7.3 g/dL (ref 6.0–8.5)
Sodium: 139 mmol/L (ref 134–144)

## 2013-05-11 NOTE — Progress Notes (Signed)
Quick Note:    Results reviewed and noted.  ______

## 2013-05-12 ENCOUNTER — Encounter

## 2013-05-25 ENCOUNTER — Encounter

## 2013-05-25 MED ORDER — WARFARIN 1 MG TAB
1 mg | ORAL_TABLET | ORAL | Status: DC
Start: 2013-05-25 — End: 2013-07-22

## 2013-05-25 MED ORDER — OXYCODONE-ACETAMINOPHEN 7.5 MG-325 MG TAB
ORAL_TABLET | Freq: Four times a day (QID) | ORAL | Status: DC | PRN
Start: 2013-05-25 — End: 2013-07-22

## 2013-06-02 NOTE — Progress Notes (Signed)
Patient was injected with 26.9 mCi of 99 mTc labeled RBC's.  3 views of the heart were taken.   Armband was removed and disposed of before the patient left.

## 2013-06-02 NOTE — Procedures (Signed)
[facility]                            [facility]                               [address]                          [place], [address] [address]                        CARDIAC MUGA SPECT REST REPORT    PATIENT:        Melissa Leblanc, Melissa Leblanc  MRN:                [id number]         DATE:       06/02/2013  BILLING:            [id number]      LOCATION:  REFERRING:      JASON M MCHUGH, DO  INTERPRETING:   Dacotah Cabello K. Wyatte Dames, MD      PROCEDURES PERFORMED: MUGA scan.    REASON FOR STUDY: Evaluate left ventricular systolic function in a patient  with history of Hodgkin's lymphoma of the head and neck post-chemotherapy.      The patient received an infusion of red blood cells tagged with 26.9 mCi of  technetium 99 using the UltraTag technique. The left ventricular systolic  function was assessed in the LAO and anterior projections.    FINDINGS: Left ventricular size and function appears to be normal. The  ejection fraction from manual processing was 64% without significant  regional wall motion abnormality. With the automated processing, the  ejection fraction was 67% without significant regional wall motion  abnormality.    CONCLUSIONS  1. Normal left ventricular chamber size and systolic function with a  calculated left ventricular ejection fraction between 64% and 67%.  2. No previous study available for comparison.                     Landi Biscardi K. Jeneane Pieczynski, MD    JKC:wmx  D: 06/02/2013  02:50 P  CScript: 06/02/2013 04:07 P  CQDocID #: [id number]  CScriptDoc #: [id number]   cc:   Taleya Whitcher K. Kilea Mccarey, MD         JASON M MCHUGH, DO         LLOYD A. SHABAZZ, MD

## 2013-06-02 NOTE — Procedures (Signed)
[facility]                            [facility]                               [address]                          [place] [address]                        CARDIAC MUGA SPECT REST REPORT    PATIENT:        Melissa Leblanc, Melissa Leblanc  MRN:                [id number]         DATE:       06/02/2013  BILLING:            [id number]      LOCATION:  REFERRING:      Elissa Hefty MCHUGH, DO  INTERPRETING:   Synetta Shadow, MD      PROCEDURES PERFORMED: MUGA scan.    REASON FOR STUDY: Evaluate left ventricular systolic function in a patient  with history of Hodgkin's lymphoma of the head and neck post-chemotherapy.      The patient received an infusion of red blood cells tagged with 26.9 mCi of  technetium 99 using the UltraTag technique. The left ventricular systolic  function was assessed in the LAO and anterior projections.    FINDINGS: Left ventricular size and function appears to be normal. The  ejection fraction from manual processing was 64% without significant  regional wall motion abnormality. With the automated processing, the  ejection fraction was 67% without significant regional wall motion  abnormality.    CONCLUSIONS  1. Normal left ventricular chamber size and systolic function with a  calculated left ventricular ejection fraction between 64% and 67%.  2. No previous study available for comparison.                     Synetta Shadow, MD    JKC:wmx  D: 06/02/2013  02:50 P  CScript: 06/02/2013 04:07 P  CQDocID #: [id number]  CScriptDoc #: [id number]   cc:   Synetta Shadow, MD         Elissa Hefty MCHUGH, DO         LLOYD A. Alanda Slim, MD

## 2013-06-15 MED ORDER — SODIUM CHLORIDE 0.9 % IJ SYRG
INTRAMUSCULAR | Status: DC | PRN
Start: 2013-06-15 — End: 2013-06-19
  Administered 2013-06-15: 15:00:00 via INTRAVENOUS

## 2013-06-15 MED ORDER — HEPARIN LOCK FLUSH 100 UNIT/ML IV SOLN
100 unit/mL | INTRAVENOUS | Status: DC | PRN
Start: 2013-06-15 — End: 2013-06-19
  Administered 2013-06-15: 15:00:00 via INTRAVENOUS

## 2013-06-15 NOTE — Progress Notes (Signed)
[facility] Progress Note    Date: June 15, 2013    Name: Jamilynn Whitacre    MRN: [id number]         DOB: [birth year]      Ms. Schmierer arrived to [facility] at 1111 am, ambulatory.    Ms. Tabbert was assessed and education was provided.     Ms. Dennington's vitals were reviewed.  Visit Vitals   Item Reading   ??? BP 147/95   ??? Pulse 113   ??? Temp 98.8 ??F (37.1 ??C)   ??? Resp 18       Each lumen of patient's upper right double lumen chest port accessed.  Blood return visualized in each lumen.  Flushed both lumens with normal saline followed by heparin per order.  De-accessed and band-aid applied to site.    Ms. Bains tolerated well without complaints.    [facility]. Jergens [facility] in [facility] condition at 1134 am.  She is to return on July 13, 2013 at 1030 am for her next appointment.    Katherina Right, RN  June 15, 2013

## 2013-07-06 MED ORDER — LIDOCAINE-PRILOCAINE 2.5 %-2.5 % TOPICAL CREAM
CUTANEOUS | Status: DC
Start: 2013-07-06 — End: 2015-03-29

## 2013-07-13 MED ORDER — HEPARIN LOCK FLUSH 100 UNIT/ML IV SOLN
100 unit/mL | INTRAVENOUS | Status: DC | PRN
Start: 2013-07-13 — End: 2013-07-17
  Administered 2013-07-13 (×2): via INTRAVENOUS

## 2013-07-13 MED ORDER — SODIUM CHLORIDE 0.9 % IJ SYRG
INTRAMUSCULAR | Status: DC | PRN
Start: 2013-07-13 — End: 2013-07-17
  Administered 2013-07-13 (×3)

## 2013-07-13 MED ORDER — WATER FOR INJECTION, STERILE INJECTION
2 mg | Freq: Once | INTRAMUSCULAR | Status: AC
Start: 2013-07-13 — End: 2013-07-13
  Administered 2013-07-13: 16:00:00

## 2013-07-13 NOTE — Progress Notes (Signed)
[facility] Progress Note    Date: July 13, 2013    Name: Nakeitha Milligan    MRN: [id number]         DOB: 11/02/1965    Monthly Port flush     Ms. Kendra was assessed and education was provided.     Ms. Lewallen's vitals were reviewed.  Visit Vitals   Item Reading   ??? BP 146/97   ??? Pulse 90   ??? Temp 98.5 ??F (36.9 ??C)   ??? Resp 16       Mediport was accessed with 20 gauge 1" huber(s) after chloroprep.    right chest inner single flushed     Blood return: YES    0 of blood collected for labs per protocol.    Flushed 20 of NS followed by Heparin 500u    Huber needle(s) removed. Band-Aid applied.    Right outer port accessed without blood flow noted.  Order received from Sandie Ano, NP for cathflo.  1117:  Cathflo instilled in outer port.    12:10 PM Outer port checked.  Brisk blood flow noted, 5cc of blood removed.  20cc NS flushed through followed by Heparin 500u. Bandaid applied.      Ms. Rhodes tolerated the procedure, and had no complaints. Patient armband removed and shredded.    Ms. Schurman was [facility] in stable condition at 12:15 PM   PM  . She is to return on 08/10/13 at 1100 for her next appointment.    Claris Gladden, RN  July 13, 2013  12:15 PM

## 2013-07-22 ENCOUNTER — Telehealth

## 2013-07-22 MED ORDER — OXYCODONE-ACETAMINOPHEN 7.5 MG-325 MG TAB
ORAL_TABLET | Freq: Four times a day (QID) | ORAL | Status: DC | PRN
Start: 2013-07-22 — End: 2013-09-07

## 2013-07-22 MED ORDER — WARFARIN 1 MG TAB
1 mg | ORAL_TABLET | ORAL | Status: DC
Start: 2013-07-22 — End: 2014-07-28

## 2013-07-22 NOTE — Telephone Encounter (Signed)
Patient needs a refill for her Percocet.  Please call her when ready.

## 2013-08-10 MED ORDER — SODIUM CHLORIDE 0.9 % IJ SYRG
INTRAMUSCULAR | Status: DC | PRN
Start: 2013-08-10 — End: 2013-08-14
  Administered 2013-08-10 (×2)

## 2013-08-10 MED ORDER — HEPARIN LOCK FLUSH 100 UNIT/ML IV SOLN
100 unit/mL | INTRAVENOUS | Status: DC | PRN
Start: 2013-08-10 — End: 2013-08-14
  Administered 2013-08-10 (×2): via INTRAVENOUS

## 2013-08-10 NOTE — Progress Notes (Signed)
[facility] Progress Note    Date: August 10, 2013    Name: Melissa Leblanc    MRN: [id number]         DOB: January 18, 1966    Monthly Port flush     Melissa Leblanc was assessed and education was provided.     Melissa Leblanc's vitals were reviewed.  Visit Vitals   Item Reading   ??? BP 103/68   ??? Pulse 84   ??? Temp 98.2 ??F (36.8 ??C)   ??? Resp 16   ??? Breastfeeding No       Mediport was accessed with 20 gauge 1" huber(s) after chloroprep.    right chest, double    Blood return: YES    0 of blood collected for labs per protocol.    Flushed 20 of NS followed by Heparin 500u in each port.    Huber needle(s) removed. Band-Aid applied.    Melissa Leblanc tolerated the procedure, and had no complaints. Patient armband removed and shredded.    Melissa Leblanc was [facility] in stable condition at 1125. She is to return on 09/07/13 at 0900 for her next appointment of Portflush.    Claris Gladden, RN  August 10, 2013  11:28 AM

## 2013-09-07 LAB — CBC WITH 3 PART DIFF
ABS. LYMPHOCYTES: 0.9 10*3/uL — ABNORMAL LOW (ref 1.1–5.9)
ABS. MIXED CELLS: 0.2 10*3/uL (ref 0.0–2.3)
ABS. NEUTROPHILS: 1.6 10*3/uL — ABNORMAL LOW (ref 1.8–9.5)
HCT: 39.6 % (ref 36–48)
HGB: 13.1 g/dL (ref 12.0–16.0)
LYMPHOCYTES: 33 % (ref 14–44)
MCH: 27.2 PG (ref 25.0–35.0)
MCHC: 33.1 g/dL (ref 31–37)
MCV: 82.3 FL (ref 78–102)
Mixed cells: 6 % (ref 0.1–17)
NEUTROPHILS: 61 % (ref 40–70)
PLATELET: 170 10*3/uL (ref 140–440)
RBC: 4.81 M/uL (ref 4.10–5.10)
RDW: 16.8 % — ABNORMAL HIGH (ref 11.5–14.5)
WBC: 2.7 10*3/uL — ABNORMAL LOW (ref 4.5–13.0)

## 2013-09-07 MED ORDER — OXYCODONE-ACETAMINOPHEN 7.5 MG-325 MG TAB
ORAL_TABLET | Freq: Four times a day (QID) | ORAL | Status: DC | PRN
Start: 2013-09-07 — End: 2013-11-02

## 2013-09-07 MED ORDER — HEPARIN LOCK FLUSH 100 UNIT/ML IV SOLN
100 unit/mL | INTRAVENOUS | Status: DC | PRN
Start: 2013-09-07 — End: 2013-09-11
  Administered 2013-09-07: 14:00:00 via INTRAVENOUS

## 2013-09-07 MED ORDER — SODIUM CHLORIDE 0.9 % IJ SYRG
INTRAMUSCULAR | Status: DC | PRN
Start: 2013-09-07 — End: 2013-09-11
  Administered 2013-09-07: 14:00:00 via INTRAVENOUS

## 2013-09-07 MED FILL — HEPARIN LOCK FLUSH 100 UNIT/ML IV SOLN: 100 unit/mL | INTRAVENOUS | Qty: 5

## 2013-09-07 MED FILL — BD POSIFLUSH NORMAL SALINE 0.9 % INJECTION SYRINGE: INTRAMUSCULAR | Qty: 40

## 2013-09-07 NOTE — Progress Notes (Signed)
Quick Note:    Labs reviewed and noted.  ______
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
## 2013-09-07 NOTE — Progress Notes (Signed)
[facility] Progress Note    Date: September 07, 2013    Name: Melissa Leblanc    MRN: [id number]         DOB: 12/09/1965      [name] arrived to [facility] at 0830 am, ambulatory.    [name] was assessed and education was provided.     [name] vitals were reviewed.  Visit Vitals   Item Reading   ??? BP 149/101   ??? Pulse 74   ??? Temp 98.3 ??F (36.8 ??C)   ??? Resp 18   ??? Breastfeeding No       Each lumen of patient's upper right double lumen chest port accessed.  Blood return visualized in each lumen.  Flushed both lumens with normal saline followed by heparin per order.  De-accessed and band-aid applied to site.    [name] tolerated well without complaints.    [name] was discharged from [place] in stable condition at 0848 am.  She is to return on 10/05/2013  at 1100 am for her next appointment.    Doylene Bode, RN  September 07, 2013

## 2013-09-08 LAB — METABOLIC PANEL, COMPREHENSIVE
A-G Ratio: 1.3 (ref 1.1–2.5)
ALT (SGPT): 13 IU/L (ref 0–32)
AST (SGOT): 17 IU/L (ref 0–40)
Albumin: 4.1 g/dL (ref 3.5–5.5)
Alk. phosphatase: 65 IU/L (ref 39–117)
BUN/Creatinine ratio: 14 (ref 9–23)
BUN: 10 mg/dL (ref 6–24)
Bilirubin, total: 0.2 mg/dL (ref 0.0–1.2)
CO2: 21 mmol/L (ref 18–29)
Calcium: 9.4 mg/dL (ref 8.7–10.2)
Chloride: 102 mmol/L (ref 97–108)
Creatinine: 0.7 mg/dL (ref 0.57–1.00)
GFR est AA: 120 mL/min/{1.73_m2} (ref >59)
GFR est non-AA: 104 mL/min/{1.73_m2} (ref >59)
GLOBULIN, TOTAL: 3.2 g/dL (ref 1.5–4.5)
Glucose: 90 mg/dL (ref 65–99)
Potassium: 4.7 mmol/L (ref 3.5–5.2)
Protein, total: 7.3 g/dL (ref 6.0–8.5)
Sodium: 140 mmol/L (ref 134–144)

## 2013-09-08 LAB — IRON PROFILE
Iron % saturation: 15 % (ref 15–55)
Iron: 46 ug/dL (ref 35–155)
TIBC: 303 ug/dL (ref 250–450)
UIBC: 257 ug/dL (ref 150–375)

## 2013-09-08 LAB — FERRITIN: Ferritin: 95 ng/mL (ref 15–150)

## 2013-09-08 LAB — SED RATE (ESR): Sed rate (ESR): 12 mm/hr (ref 0–32)

## 2013-09-08 NOTE — Progress Notes (Signed)
Quick Note:    Results reviewed and noted.  ______

## 2013-09-29 LAB — AMB POC URINALYSIS DIP STICK AUTO W/O MICRO
Blood (UA POC): NEGATIVE
Glucose (UA POC): NEGATIVE
Leukocyte esterase (UA POC): NEGATIVE
Nitrites (UA POC): NEGATIVE
Specific gravity (UA POC): 1.03 (ref 1.001–1.035)
Urobilinogen (UA POC): 0.2 (ref 0.2–1)
pH (UA POC): 5 (ref 4.6–8.0)

## 2013-09-29 MED ORDER — LISINOPRIL-HYDROCHLOROTHIAZIDE 20 MG-25 MG TAB
20-25 mg | ORAL_TABLET | Freq: Every day | ORAL | Status: DC
Start: 2013-09-29 — End: 2014-04-10

## 2013-09-29 NOTE — Progress Notes (Signed)
Patient is currently not taking the following medications:  n/a    Learning Assessment (baseline): Completed  Depression Screening: Completed  Ebola Screening: Completed  ADL Screening:  Completed  Abuse Screening: Completed    1. Have you been to the ER, urgent care clinic since your last visit?  Hospitalized since your last visit?No    2. Have you seen or consulted any other health care providers outside of the [facility] since your last visit?  Include any pap smears or colon screening. No

## 2013-09-29 NOTE — Patient Instructions (Signed)
Urge Incontinence in Women: After Your Visit  Your Care Instructions  Urge incontinence occurs when the need to urinate is so strong that you cannot reach the toilet in time, even when your bladder contains only a small amount of urine. This is also called overactive bladder or unstable bladder. Some women may have no warning before they leak urine. This condition does not cause major health problems, but it can be embarrassing and can affect a woman's self-esteem and confidence.  Treatment can cure or improve your symptoms.  Follow-up care is a key part of your treatment and safety. Be sure to make and go to all appointments, and call your doctor if you are having problems. It's also a good idea to know your test results and keep a list of the medicines you take.  How can you care for yourself at home?  ?? Take your medicines exactly as prescribed. Call your doctor if you think you are having a problem with your medicine. You will get more details on the specific medicines your doctor prescribes.  ?? Limit caffeine and alcohol???they stimulate urine production.  ?? Urinate every 2 to 4 hours during waking hours, even if you feel that you do not have to go.  ?? Do pelvic floor (Kegel) exercises, which tighten and strengthen pelvic muscles. To do Kegel exercises:  ?? Squeeze the same muscles you would use to stop your urine. Your belly and thighs should not move.  ?? Hold the squeeze for 3 seconds, then relax for 3 seconds.  ?? Start with 3 seconds. Then add 1 second each week until you are able to squeeze for 10 seconds.  ?? Repeat the exercise 10 to 15 times for each session. Do three or more sessions each day.  ?? Try wearing pads that absorb the leaks.  ?? Keep skin in the genital area dry.  When should you call for help?  Call your doctor now or seek immediate medical care if:  ?? You have symptoms of a urinary infection. For example:  ?? You have blood or pus in your urine.  ?? You have pain in your back just below your rib  cage. This is called flank pain.  ?? You have a fever, chills, or body aches.  ?? It hurts to urinate.  ?? You have groin or belly pain.  Watch closely for changes in your health, and be sure to contact your doctor if:  ?? You have a hard time urinating when your bladder feels full.  ?? You feel like you need to urinate often.  ?? Your bladder feels full even after you urinate.  ?? Your symptoms are getting worse.   Where can you learn more?   Go to http://www.healthwise.net/[facility]  Enter U825 in the search box to learn more about "Urge Incontinence in Women: After Your Visit."   ?? 2006-2014 Healthwise[employer] Inc[employer]. Care instructions adapted under license by [employer] (which disclaims liability or warranty for this information). This care instruction is for use with your licensed healthcare professional. If you have questions about a medical condition or this instruction, always ask your healthcare professional. Healthwise, Incorporated disclaims any warranty or liability for your use of this information.  Content Version: 10.2.346038; Current as of: March 24, 2012
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
## 2013-09-30 NOTE — Telephone Encounter (Signed)
[facility] for patient's supplies to see why she has not received them and we faxed the order on 08/31/13.  Found out that they have been trying to call patient and the number they have is incorrect.  I did advise when we saw patient she made reference to the fact her number changed.  Gave them the correct number to get in contact with her so they can confirm the order.

## 2013-10-12 MED ORDER — SODIUM CHLORIDE 0.9 % IJ SYRG
INTRAMUSCULAR | Status: DC | PRN
Start: 2013-10-12 — End: 2013-10-16
  Administered 2013-10-12: 18:00:00 via INTRAVENOUS

## 2013-10-12 MED ORDER — HEPARIN LOCK FLUSH 100 UNIT/ML IV SOLN
100 unit/mL | INTRAVENOUS | Status: AC | PRN
Start: 2013-10-12 — End: 2013-10-13
  Administered 2013-10-12: 18:00:00 via INTRAVENOUS

## 2013-10-12 MED FILL — HEPARIN LOCK FLUSH 100 UNIT/ML IV SOLN: 100 unit/mL | INTRAVENOUS | Qty: 10

## 2013-10-12 MED FILL — BD POSIFLUSH NORMAL SALINE 0.9 % INJECTION SYRINGE: INTRAMUSCULAR | Qty: 40

## 2013-10-12 NOTE — Progress Notes (Signed)
[facility] Progress Note    Date: October 12, 2013    Name: Melissa Leblanc    MRN: [id number]         DOB: 20-Oct-1965      [name] arrived to [facility] at 1253 pm, ambulatory.    [name] was assessed and education was provided.     [name] vitals were reviewed.  Visit Vitals   Item Reading   ??? BP 143/89   ??? Pulse 77   ??? Temp 98 ??F (36.7 ??C)   ??? Resp 18   ??? Breastfeeding No       Each lumen of patient's upper right double lumen chest port accessed.  Blood return visualized in each lumen.  Flushed both lumens with normal saline followed by heparin per order.  De-accessed and band-aid applied to site.    [name] tolerated well without complaints.    [name] was discharged from [place] in stable condition at 1310 pm.  She is to return on 11/08/13 at 1500 pm for her next appointment.     Kristeen Miss, RN  October 12, 2013

## 2013-11-02 ENCOUNTER — Encounter

## 2013-11-02 MED ORDER — OXYCODONE-ACETAMINOPHEN 7.5 MG-325 MG TAB
ORAL_TABLET | Freq: Four times a day (QID) | ORAL | Status: DC | PRN
Start: 2013-11-02 — End: 2013-12-28

## 2013-11-02 NOTE — Telephone Encounter (Signed)
PT WAS MADE AWARE OF A POSSIBLE 24-48 HOUR TIME FRAME FOR MEDICATION REFILL REQUESTS.

## 2013-11-08 ENCOUNTER — Encounter

## 2013-11-08 MED ORDER — SODIUM CHLORIDE 0.9 % IJ SYRG
INTRAMUSCULAR | Status: DC | PRN
Start: 2013-11-08 — End: 2013-11-12
  Administered 2013-11-08 (×2): via INTRAVENOUS

## 2013-11-08 MED ORDER — HEPARIN LOCK FLUSH 100 UNIT/ML IV SOLN
100 unit/mL | INTRAVENOUS | Status: DC | PRN
Start: 2013-11-08 — End: 2013-11-12
  Administered 2013-11-08 (×2)

## 2013-11-08 MED FILL — HEPARIN LOCK FLUSH 100 UNIT/ML IV SOLN: 100 unit/mL | INTRAVENOUS | Qty: 10

## 2013-11-08 MED FILL — BD POSIFLUSH NORMAL SALINE 0.9 % INJECTION SYRINGE: INTRAMUSCULAR | Qty: 40

## 2013-11-08 NOTE — Progress Notes (Signed)
[facility] OPIC Progress Note    Date: November 08, 2013    Name: Kynzie Polgar    MRN: [id number]         DOB: December 08, 1965    Monthly Port flush     Ms. Wander was assessed and education was provided.     Ms. Gallacher's vitals were reviewed and patient was observed for 5 minutes prior to procedure.  Visit Vitals   Item Reading   ??? BP 112/78   ??? Pulse 82   ??? Temp 98.3 ??F (36.8 ??C)   ??? Resp 18   ??? SpO2 99%   ??? Breastfeeding No       Mediports were accessed    Side:rt chest,/double    Blood return: YES to both ports    Flushed 10 ml of NS to each port, followed by Heparin 500 units to each port     Richardson needles removed. Gauze/tegaderm applied to sites.sites s s/s.    [name] tolerated the procedure, and had no complaints.    Ms. Leavey was discharged from [facility] in stable condition at 1525. She is to return on 12/06/13 at 1330 for her next appointment.    Adonis Housekeeper, RN  November 08, 2013  3:33 PM

## 2013-11-09 NOTE — Progress Notes (Signed)
Quick Note:    Results reviewed and noted.  ______

## 2013-12-06 MED ORDER — HEPARIN LOCK FLUSH 100 UNIT/ML IV SOLN
100 unit/mL | INTRAVENOUS | Status: AC | PRN
Start: 2013-12-06 — End: 2013-12-07

## 2013-12-06 MED ORDER — SODIUM CHLORIDE 0.9 % IJ SYRG
INTRAMUSCULAR | Status: DC | PRN
Start: 2013-12-06 — End: 2013-12-10
  Administered 2013-12-06 (×2): via INTRAVENOUS

## 2013-12-06 MED ORDER — HEPARIN LOCK FLUSH 100 UNIT/ML IV SOLN
100 unit/mL | INTRAVENOUS | Status: AC
Start: 2013-12-06 — End: 2013-12-07

## 2013-12-06 MED ORDER — HEPARIN LOCK FLUSH 100 UNIT/ML IV SOLN
100 unit/mL | INTRAVENOUS | Status: AC
Start: 2013-12-06 — End: 2013-12-06
  Administered 2013-12-06: 18:00:00

## 2013-12-06 MED FILL — HEPARIN LOCK FLUSH 100 UNIT/ML IV SOLN: 100 unit/mL | INTRAVENOUS | Qty: 5

## 2013-12-06 MED FILL — BD POSIFLUSH NORMAL SALINE 0.9 % INJECTION SYRINGE: INTRAMUSCULAR | Qty: 40

## 2013-12-06 NOTE — Progress Notes (Signed)
[facility] OPIC Progress Note    Date: December 06, 2013    Name: Melissa Leblanc    MRN: [id number]         DOB: 06-04-1966      [name] arrived to [facility] at 1325pm, ambulatory.    [name] was assessed and education was provided.     [name] vitals were reviewed.  Visit Vitals   Item Reading   ??? BP 130/81   ??? Pulse 83   ??? Temp 98.2 ??F (36.8 ??C)   ??? Resp 20   ??? SpO2 99%       Each lumen of patient's upper right double lumen chest port accessed.  Blood return visualized in each lumen.  Flushed both lumens with normal saline followed by heparin per order.  De-accessed and band-aid applied to site.    [name] tolerated well without complaints.    [name] was discharged from [place] [place] in stable condition at 1340pm.  She is to return on 01/03/14 at 0800 am for her next appointment.     Josephina Shih, RN  December 06, 2013

## 2013-12-07 LAB — CBC WITH 3 PART DIFF
ABS. LYMPHOCYTES: 1.1 10*3/uL (ref 1.1–5.9)
ABS. MIXED CELLS: 0.5 10*3/uL (ref 0.0–2.3)
ABS. NEUTROPHILS: 2.2 10*3/uL (ref 1.8–9.5)
HCT: 40.1 % (ref 36–48)
HGB: 13.6 g/dL (ref 12.0–16.0)
LYMPHOCYTES: 29 % (ref 14–44)
MCH: 28.2 PG (ref 25.0–35.0)
MCHC: 33.9 g/dL (ref 31–37)
MCV: 83.2 FL (ref 78–102)
Mixed cells: 13 % (ref 0.1–17)
NEUTROPHILS: 58 % (ref 40–70)
PLATELET: 173 10*3/uL (ref 140–440)
RBC: 4.82 M/uL (ref 4.10–5.10)
RDW: 16.1 % — ABNORMAL HIGH (ref 11.5–14.5)
WBC: 3.8 10*3/uL — ABNORMAL LOW (ref 4.5–13.0)

## 2013-12-07 NOTE — Progress Notes (Signed)
Quick Note:    Results reviewed and noted.  ______

## 2013-12-07 NOTE — Addendum Note (Signed)
Addended by: Lorie Apley on: 12/07/2013 12:30 PM     Modules accepted: Orders

## 2013-12-07 NOTE — Progress Notes (Signed)
Hematology/Oncology  Progress Note    Name: Melissa Leblanc  Date: 12/07/2013  DOB: 03-Oct-1965    [id number] Melissa Noa, MD    Melissa Leblanc is a 48 year old female who was seen for management of her stage IIB nodular sclerosing Hodgkin's disease.  Current therapy: the patient has completed a full course of ABVD chemotherapy.    Subjective:     Melissa Leblanc is a 48 year old African American woman who has a history of stage IIB nodular sclerosing Hodgkin's lymphoma.  Today she is complaining of some fullness over the left neck and some pain in the left shoulder.  She has occasionally experience fever and night sweats but have not experienced any weight loss.  There are no additional complaints.      Past Medical History   Diagnosis Date   ??? Hypertension    ??? Prediabetes    ??? VAIN I (vaginal intraepithelial neoplasia grade I)    ??? Anxiety    ??? Lymphoma ([place])    ??? Neck mass    ??? Cancer [facility])      lymph node cancer   ??? 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 GERD (gastroesophageal reflux disease)    ??? Asthma      Past Surgical History   Procedure Laterality Date   ??? Hx gyn       BTL   ??? Hx hysterectomy  2004   ??? Hx other surgical       lymph node biopsies neck   ??? Hx vascular access       double right chest mediport     History     Social History   ??? Marital Status: SINGLE     Spouse Name: N/A     Number of Children: N/A   ??? Years of Education: N/A     Occupational History   ??? Not on file.     Social History Main Topics   ??? Smoking status: Current Every Day Smoker -- 0.25 packs/day for 25 years   ??? Smokeless tobacco: Never Used   ??? Alcohol Use: 3.0 oz/week     6 Cans of beer per week      Comment: every 3 to 4 days   ??? Drug Use: Yes     Special: Marijuana      Comment: one or 2 puffs occasionally   ??? Sexual Activity: No     Other Topics Concern   ??? Not on file     Social History Narrative     Family History   Problem Relation Age of Onset   ??? Cancer Mother    ??? Breast Cancer Mother    ??? Cancer Father    ??? Alcohol abuse Neg Hx    ???  Arthritis-rheumatoid Neg Hx    ??? Asthma Neg Hx    ??? Bleeding Prob Neg Hx    ??? Diabetes Neg Hx    ??? Elevated Lipids Neg Hx    ??? Headache Neg Hx    ??? Heart Disease Neg Hx    ??? Hypertension Neg Hx    ??? Lung Disease Neg Hx    ??? Migraines Neg Hx    ??? Psychiatric Disorder Neg Hx    ??? Stroke Neg Hx    ??? Mental Retardation Neg Hx      Current Outpatient Prescriptions   Medication Sig Dispense Refill   ??? oxyCODONE-acetaminophen (PERCOCET) 7.5-325 mg per tablet Take 1  Tab by mouth every six (6) hours as needed for Pain.  120 Tab  0   ??? lisinopril-hydrochlorothiazide (PRINZIDE, ZESTORETIC) 20-25 mg per tablet Take 1 Tab by mouth daily.  90 Tab  1   ??? warfarin (COUMADIN) 1 mg tablet Take 1 tab po qd at 6pm or after  30 tablet  6   ??? lidocaine-prilocaine (EMLA) topical cream Apply to mediport 1 hour prior to chemo  30 g  6   ??? cyclobenzaprine (FLEXERIL) 5 mg tablet Take 1 Tab by mouth three (3) times daily as needed for Muscle Spasm(s).  90 Tab  3   ??? prochlorperazine (COMPAZINE) 10 mg tablet Take 1 tab po Q 6 hours with Benadryl 62m for nausea  60 Tab  2   ??? diphenhydrAMINE (BENADRYL) 25 mg capsule Take 1 tab po Q 6 hours with compazine for nausea  30 Cap  2   ??? ibuprofen (MOTRIN) 800 mg tablet Take 1 Tab by mouth three (3) times daily as needed for Pain.  40 Tab  0     Facility-Administered Medications Ordered in Other Visits   Medication Dose Route Frequency Provider Last Rate Last Dose   ??? sodium chloride (NS) flush 10-40 mL  10-40 mL IntraVENous PRN LJoycelyn Das MD   30 mL at 12/06/13 1337   ??? heparin (porcine) 100 unit/mL injection 500 Units  500 Units IntraVENous PRN LJoycelyn Das MD           Review of Systems  Constitutional: The patient he is complaining of some fatigue and weakness.  HEENT: The patient denies recent head trauma, eye pain, blurred vision,  hearing deficit, oropharyngeal mucosal pain or lesions, and the patient denies throat pain or discomfort.  Lymphatics: The patient suspect that the lymph  node to the left neck area have enlarged.  Hematologic: The patient denies having bruising, bleeding, or progressive fatigue.  Respiratory: Patient denies having shortness of breath, cough, sputum production, fever, or dyspnea on exertion.  Cardiovascular: The patient denies having leg pain, leg swelling, heart palpitations, chest permit, chest pain, or lightheadedness.  The patient denies having dyspnea on exertion.  Gastrointestinal: The patient denies having nausea, emesis, or diarrhea. The patient denies having any hematemesis or blood in the stool.  Genitourinary: Patient denies having urinary urgency, frequency, or dysuria.  The patient denies having blood in the urine.  Psychological: The patient denies having symptoms of nervousness, anxiety, depression, or thoughts of harming himself some of this.  Skin: Patient denies having skin rashes, skin, ulcerations, or unexplained itching or pruritus.  Musculoskeletal: The patient denies having pain in the joints or bones.     Objective:   BP 142/86   Pulse 66   Temp(Src) 98.7 ??F (37.1 ??C) (Oral)   Ht _0  (1.651 m)   Wt 81.92 kg (180 lb 9.6 oz)   BMI 30.05 kg/m2  ECOG Ps=0-1, Pain score=0    Physical Exam:   Gen. Appearance: The patient is in no acute distress.  Skin: There is no bruise or rash.  HEENT: The exam is unremarkable.  Neck: Supple without lymphadenopathy or thyromegaly.  Lungs: Clear to auscultation and percussion; there are no wheezes or rhonchi.  Heart: Regular rate and rhythm; there are no murmurs, gallops, or rubs.  Abdomen: Bowel sounds are present and normal.  There is no guarding, tenderness, or hepatosplenomegaly.  Extremities: There is no clubbing, cyanosis, or edema.  Neurologic: There are no focal neurologic deficits.  Lymphatics: There  is palpable supraclavicular cervical lymphadenopathy on the left.    Lab data:      Results for orders placed in visit on 09/29/13   AMB POC URINALYSIS DIP STICK AUTO W/O MICRO       Result Value Ref Range     Color (UA POC) Yellow      Clarity (UA POC) Clear      Glucose (UA POC) Negative  Negative    Bilirubin (UA POC) 1+  Negative    Ketones (UA POC) Trace  Negative    Specific gravity (UA POC) 1.030  1.001 - 1.035    Blood (UA POC) Negative  Negative    pH (UA POC) 5.0  4.6 - 8.0    Protein (UA POC) Trace  Negative mg/dL    Urobilinogen (UA POC) 0.2 mg/dL  0.2 - 1    Nitrites (UA POC) Negative  Negative    Leukocyte esterase (UA POC) Negative  Negative      CBC from today reveals a WBC, 3.8, hemoglobin 113.6 g/dL, hematocrit 40.1%, and the platelet count is 1 73,000.      Assessment:     1. Anemia, probable iron deficiency   2. Hodgkin's disease of head, face, and neck (HCC)    3. Leukopenia      Plan:   Anemia: I have explained to the patient and her CBC today showed a hemoglobin is now normal at 13.6 g/dL with hematocrit of 40.1%.  She was instructed to continue taking the iron supplement at least once daily.    Nodular sclerosing Hodgkin's lymphoma: Clinically she has palpable lymph glands in the left supraclavicular and anterior cervical region with fullness in the supraclavicular area as well.  I am concerned that she may be developing recurrent disease.  The patient will be referred to the hospital for CT scans of the neck, chest, abdomen, and pelvis to assess for recurrent Hodgkin's lymphoma.  I will have her return to the clinic in 2 weeks to review tests results and discuss options of management.     Leukopenia: The current CBC shows that she has developed leukopenia with a WBC count of 3.8.  Flow cytometry of the aorta at this time for further assessment.  I have explained to the patient and I suspect that she may be experiencing recurrent disease therefore after the CT scans have been completed we may need to schedule her for a bone marrow biopsy to rule out any evidence of bone marrow recurrence of Hodgkin's lymphoma.  Orders Placed This Encounter   ??? COMPLETE CBC & AUTO DIFF WBC   ??? InHouse CBC ([facility])      Standing Status: Future      Number of Occurrences: 1      Standing Expiration Date: 12/14/2013   ??? SED RATE (ESR)   ??? METABOLIC PANEL, COMPREHENSIVE   ??? IMMUNOPHENOTYPING PROFILE     Order Specific Question:  Specimen type     Answer:  Blood [2]       Joycelyn Das, MD  12/07/2013

## 2013-12-08 LAB — METABOLIC PANEL, COMPREHENSIVE
A-G Ratio: 1.4 (ref 1.1–2.5)
ALT (SGPT): 13 IU/L (ref 0–32)
AST (SGOT): 16 IU/L (ref 0–40)
Albumin: 4.2 g/dL (ref 3.5–5.5)
Alk. phosphatase: 64 IU/L (ref 39–117)
BUN/Creatinine ratio: 15 (ref 9–23)
BUN: 11 mg/dL (ref 6–24)
Bilirubin, total: 0.3 mg/dL (ref 0.0–1.2)
CO2: 21 mmol/L (ref 18–29)
Calcium: 8.9 mg/dL (ref 8.7–10.2)
Chloride: 103 mmol/L (ref 97–108)
Creatinine: 0.74 mg/dL (ref 0.57–1.00)
GFR est AA: 112 mL/min/{1.73_m2} (ref >59)
GFR est non-AA: 97 mL/min/{1.73_m2} (ref >59)
GLOBULIN, TOTAL: 2.9 g/dL (ref 1.5–4.5)
Glucose: 83 mg/dL (ref 65–99)
Potassium: 4.4 mmol/L (ref 3.5–5.2)
Protein, total: 7.1 g/dL (ref 6.0–8.5)
Sodium: 139 mmol/L (ref 134–144)

## 2013-12-08 LAB — SED RATE (ESR): Sed rate (ESR): 9 mm/hr (ref 0–32)

## 2013-12-08 NOTE — Progress Notes (Signed)
Quick Note:    Results reviewed and noted.  ______

## 2013-12-14 NOTE — Patient Instructions (Addendum)
Medicare Part B Preventive Services Limitations Recommendation Scheduled   Bone Mass Measurement  (age 47 & older, biennial) Requires diagnosis related to osteoporosis or estrogen deficiency. Biennial benefit unless patient has history of long-term glucocorticoid tx or baseline is needed because initial test was by other method  N/A   Cardiovascular Screening Blood Tests (every 5 years)  Total cholesterol, HDL, Triglycerides Order as a panel if possible  ordered   Colorectal Cancer Screening  -Fecal occult blood test (annual)  -Flexible sigmoidoscopy (5y)  -Screening colonoscopy (10y)  -Barium Enema   N/A   Counseling to Prevent Tobacco Use (up to 8 sessions per year)  - Counseling greater than 3 and up to 10 minutes  - Counseling greater than 10 minutes Patients must be asymptomatic of tobacco-related conditions to receive as preventive service  Discussed - cutting back   Diabetes Screening Tests (at least every 3 years, Medicare covers annually or at 32-month intervals for prediabetic patients)    Fasting blood sugar (FBS) or glucose tolerance test (GTT) Patient must be diagnosed with one of the following:  -Hypertension, Dyslipidemia, obesity, previous impaired FBS or GTT  ???Or any two of the following: overweight, FH of diabetes, age ?59, history of gestational diabetes, birth of baby weighing more than 9 pounds  ordered   Diabetes Self-Management Training (DSMT) (no USPSTF recommendation) Requires referral by treating physician for patient with diabetes or renal disease. 10 hours of initial DSMT session of no less than 30 minutes each in a continuous 63-month period.  2 hours of follow-up DSMT in subsequent years.  N/A   Glaucoma Screening (no USPSTF recommendation) Diabetes mellitus, family history, African American, age 51 or over, Hispanic American, age 55 or over  N/A   Human Immunodeficiency Virus (HIV) Screening (annually for increased risk patients)  HIV-1 and HIV-2 by EIA, ELISA, rapid antibody test, or  oral mucosa transudate Patient must be at increased risk for HIV infection per USPSTF guidelines or pregnant.  Tests covered annually for patients at increased risk.  Pregnant patients may receive up to 3 test during pregnancy.  N/A   Medical Nutrition Therapy (MNT) (for diabetes or renal disease not recommended schedule) Requires referral by treating physician for patient with diabetes or renal disease.  Can be provided in same year as diabetes self-management training (DSMT), and CMS recommends medical nutrition therapy take place after DSMT.  Up to 3 hours for initial year and 2 hours in subsequent years.  N/A   Prostate Cancer Screening (annually up to age 41)  - Digital rectal exam (DRE)  - Prostate specific antigen (PSA) Annually (age 14 or over), DRE not paid separately when covered E/M service is provided on same date  N/A   Seasonal Influenza Vaccination (annually)   2014   Pneumococcal Vaccination (once after 42)   N/A   Hepatitis B Vaccinations (if medium/high risk) Medium/high risk factors:  End-stage renal disease,  Hemophiliacs who received Factor VIII or IX concentrates, Clients of institutions for the mentally retarded, Persons who live in the same house as a HepB virus carrier, Homosexual men, Illicit injectable drug abusers.  N/A   Screening Mammography (biennial age 48-74)? Annually (age 89 or over)  ordered   Screening Pap Tests and Pelvic Examination (up to age 78 and after 50 if unknown history or abnormal study last 10 years) Every 55 months except high risk  N/A - hyster   Ultrasound Screening for Abdominal Aortic Aneurysm (AAA) (once) Patient must be referred through [facility]  and not have had a screening for abdominal aortic aneurysm before under Medicare.  Limited to patients who meet one of the following criteria:  - Men who are 85-62 years old and have smoked more than 100 cigarettes in their lifetime.  -Anyone with a FH of AAA  -Anyone recommended for screening by USPSTF  N/A   Family  Practice Management 2011    Health Maintenance Due   Topic Date Due   ??? Tdap Vaccine  04/27/1986   ??? Td Vaccine  04/27/1986   ??? Flu Vaccine  03/19/2013       Well Visit, Ages 39 to 46: After Your Visit  Your Care Instructions  Physical exams can help you stay healthy. Your doctor has checked your overall health and may have suggested ways to take good care of yourself. He or she also may have recommended tests. At home, you can help prevent illness with healthy eating, regular exercise, and other steps.  Follow-up care is a key part of your treatment and safety. Be sure to make and go to all appointments, and call your doctor if you are having problems. It's also a good idea to know your test results and keep a list of the medicines you take.  How can you care for yourself at home?  ?? Reach and stay at a healthy weight. This will lower your risk for many problems, such as obesity, diabetes, heart disease, and high blood pressure.  ?? Get at least 30 minutes of physical activity on most days of the week. Walking is a good choice. You also may want to do other activities, such as running, swimming, cycling, or playing tennis or team sports. Discuss any changes in your exercise program with your doctor.  ?? Do not smoke or allow others to smoke around you. If you need help quitting, talk to your doctor about stop-smoking programs and medicines. These can increase your chances of quitting for good.  ?? Talk to your doctor about whether you have any risk factors for sexually transmitted infections (STIs). Having one sex partner (who does not have STIs and does not have sex with anyone else) is a good way to avoid these infections.  ?? Use birth control if you do not want to have children at this time. Talk with your doctor about the choices available and what might be best for you.  ?? Always wear sunscreen on exposed skin. Make sure the sunscreen blocks ultraviolet rays (both UVA and UVB) and has a sun protection factor  (SPF) of at least 15. Use it every day, even when it is cloudy. Some doctors may recommend a higher SPF, such as 61.  ?? See a dentist one or two times a year for checkups and to have your teeth cleaned.  ?? Wear a seat belt in the car.  ?? Drink alcohol in moderation, if at all. That means no more than 2 drinks a day for men and 1 drink a day for women.  Follow your doctor's advice about when to have certain tests. These tests can spot problems early.  For everyone  ?? Cholesterol. Have the fat (cholesterol) in your blood tested after age 55. Your doctor will tell you how often to have this done based on your age, family history, or other things that can increase your risk for heart disease.  ?? Blood pressure. Experts suggest that healthy adults with normal blood pressure (119/79 mm Hg or below) have their blood pressure checked at  least every 1 to 2 years. This can be done during a routine doctor visit. If you have slightly higher or high blood pressure, your doctor will suggest more frequent tests.  ?? Vision. Talk with your doctor about how often to have a glaucoma test.  ?? Diabetes. Ask your doctor whether you should have tests for diabetes.  ?? Colon cancer. Have a test for colon cancer at age 58. You may have one of several tests. If you are younger than 81, you may need a test earlier if you have any risk factors. Risk factors include whether you already had a precancerous polyp removed from your colon or whether your parent, brother, sister, or child has had colon cancer.  For women  ?? Breast exam and mammogram. Talk to your doctor about when you should have a clinical breast exam and a mammogram. Medical experts differ on whether and how often women under 50 should have these tests. Your doctor can help you decide what is right for you.  ?? Pap test and pelvic exam. Begin Pap tests at age 42. A Pap test is the best way to find cervical cancer. The test often is part of a pelvic exam. Ask how often to have this  test.  ?? Tests for sexually transmitted infections (STIs). Ask whether you should have tests for STIs. You may be at risk if you have sex with more than one person, especially if your partners do not wear condoms.  For men  ?? Tests for sexually transmitted infections (STIs). Ask whether you should have tests for STIs. You may be at risk if you have sex with more than one person, especially if you do not wear a condom.  ?? Testicular cancer exam. Ask your doctor whether you should check your testicles regularly.  ?? Prostate exam. Talk to your doctor about whether you should have a blood test (called a PSA test) for prostate cancer. Experts differ on whether and when men should have this test. Some experts suggest it if you are older than 2 and are African-American or have a father or brother who got prostate cancer when he was younger than 63.  When should you call for help?  Watch closely for changes in your health, and be sure to contact your doctor if you have any problems or symptoms that concern you.   Where can you learn more?   Go to [email or web address]  Enter P072 in the search box to learn more about "Well Visit, Ages 30 to 34: After Your Visit."   ?? 2006-2015 [employer]. Care instructions adapted under license by [employer] (which disclaims liability or warranty for this information). This care instruction is for use with your licensed healthcare professional. If you have questions about a medical condition or this instruction, always ask your healthcare professional. [employer] any warranty or liability for your use of this information.  Content Version: 10.4.390249; Current as of: October 28, 2012

## 2013-12-14 NOTE — Progress Notes (Signed)
This is a Subsequent Medicare Annual Wellness Visit providing Personalized Prevention Plan Services (PPPS) (Performed 12 months after initial AWV and PPPS )    I have reviewed the patient's medical history in detail and updated the computerized patient record.    Has known NHL and seems to be recurring so restarting Tx with Dr. Nadyne Coombes.  Some weight loss and swelling left neck as well as hot flashes.  Has cut back smoking 1/2 ppd now.  Shoulder pain left for several months - worsening.    History     Past Medical History   Diagnosis Date   ??? Hypertension    ??? Prediabetes    ??? VAIN I (vaginal intraepithelial neoplasia grade I)    ??? Anxiety    ??? Lymphoma ([place])    ??? Neck mass    ??? Cancer [facility])      lymph node cancer   ??? Diabetes ([place])      border line diabetes   ??? GERD (gastroesophageal reflux disease)    ??? Asthma       Past Surgical History   Procedure Laterality Date   ??? Hx gyn       BTL   ??? Hx hysterectomy  2004   ??? Hx other surgical       lymph node biopsies neck   ??? Hx vascular access       double right chest mediport     Current Outpatient Prescriptions   Medication Sig Dispense Refill   ??? oxyCODONE-acetaminophen (PERCOCET) 7.5-325 mg per tablet Take 1 Tab by mouth every six (6) hours as needed for Pain.  120 Tab  0   ??? lisinopril-hydrochlorothiazide (PRINZIDE, ZESTORETIC) 20-25 mg per tablet Take 1 Tab by mouth daily.  90 Tab  1   ??? warfarin (COUMADIN) 1 mg tablet Take 1 tab po qd at 6pm or after  30 tablet  6   ??? lidocaine-prilocaine (EMLA) topical cream Apply to mediport 1 hour prior to chemo  30 g  6   ??? cyclobenzaprine (FLEXERIL) 5 mg tablet Take 1 Tab by mouth three (3) times daily as needed for Muscle Spasm(s).  90 Tab  3   ??? prochlorperazine (COMPAZINE) 10 mg tablet Take 1 tab po Q 6 hours with Benadryl 25mg  for nausea  60 Tab  2   ??? diphenhydrAMINE (BENADRYL) 25 mg capsule Take 1 tab po Q 6 hours with compazine for nausea  30 Cap  2   ??? ibuprofen (MOTRIN) 800 mg tablet Take 1 Tab by mouth three (3)  times daily as needed for Pain.  40 Tab  0     No Known Allergies  Family History   Problem Relation Age of Onset   ??? Cancer Mother    ??? Breast Cancer Mother    ??? Cancer Father    ??? Alcohol abuse Neg Hx    ??? Arthritis-rheumatoid Neg Hx    ??? Asthma Neg Hx    ??? Bleeding Prob Neg Hx    ??? Diabetes Neg Hx    ??? Elevated Lipids Neg Hx    ??? Headache Neg Hx    ??? Heart Disease Neg Hx    ??? Hypertension Neg Hx    ??? Lung Disease Neg Hx    ??? Migraines Neg Hx    ??? Psychiatric Disorder Neg Hx    ??? Stroke Neg Hx    ??? Mental Retardation Neg Hx      History   Substance Use  Topics   ??? Smoking status: Current Every Day Smoker -- 0.25 packs/day for 25 years   ??? Smokeless tobacco: Never Used   ??? Alcohol Use: 3.0 oz/week     6 Cans of beer per week      Comment: every 3 to 4 days     Patient Active Problem List   Diagnosis Code   ??? 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 Prediabetes 790.29   ??? HTN (hypertension) 401.9   ??? Urge incontinence 788.31   ??? Supraclavicular lymphadenopathy 785.6   ??? Hodgkin's disease of head, face, and neck (HCC) 201.91   ??? Anemia 285.9   ??? Back pain 724.5   ??? Leukopenia 288.50       Depression Risk Factor Screening:     PHQ 2 / 9, over the last two weeks 03/30/2013   Little interest or pleasure in doing things Not at all   Feeling down, depressed or hopeless Not at all   Total Score PHQ 2 0     Alcohol Risk Factor Screening:   On any occasion during the past 3 months, have you had more than 3 drinks containing alcohol?  Yes    Do you average more than 7 drinks per week?  Yes      Functional Ability and Level of Safety:     Hearing Loss   mild    Activities of Daily Living   Self-care.   Requires assistance with: no ADLs    Fall Risk   No flowsheet data found.  Abuse Screen   Patient is not abused    Review of Systems   A comprehensive review of systems was negative except for that written in the HPI.    Physical Examination     Evaluation of Cognitive Function:  Mood/affect:  happy   Appearance: age appropriate  Family member/caregiver input: N/A    BP 122/82   Pulse 74   Temp(Src) 98 ??F (36.7 ??C) (Oral)   Resp 16   Ht 5\' 5"  (1.651 m)   Wt 178 lb (80.74 kg)   BMI 29.62 kg/m2   SpO2 97%  Declined Breast Exam  Hyster so no need for PAP  + Hawkins left shoulder with poor ROM d/t pain    Patient Care Team:  Zackery Barefoot, DO as PCP - General (Family Practice)  Lia Hopping, MD (General Surgery)  Joycelyn Das, MD (Oncology)    Advice/Referrals/Counseling   Education and counseling provided:  Are appropriate based on today's review and evaluation  Influenza Vaccine  Cardiovascular screening blood test    Assessment/Plan       ICD-9-CM    1. Routine general medical examination at a health care facility V70.0    2. Screening for breast cancer V76.10 MAM MAMMO BI SCREENING DIGTL   3. Essential hypertension 401.9    4. Screening for hyperlipidemia V77.91 LIPID PANEL       5-10 year plan provided and discussed.  Will notify results lipid.  If desired return for shoulder Tx.     Visual Acuity Screening    Right eye Left eye Both eyes   Without correction: 20/70 20/50 20/50    With correction:        Not wearing glasses.

## 2013-12-14 NOTE — Progress Notes (Signed)
Patient is currently not taking the following medications:  n/a    Learning Assessment (baseline): Completed  Depression Screening: Completed  [place]ola Screening: Completed  Abuse Screening: completed    1. Have you been to the ER, urgent care clinic since your last visit?  Hospitalized since your last visit?No    2. Have you seen or consulted any other health care providers outside of the [facility] since your last visit?  Include any pap smears or colon screening. Yes see care team list

## 2013-12-15 ENCOUNTER — Encounter

## 2013-12-15 LAB — LIPID PANEL
Cholesterol, total: 202 mg/dL — ABNORMAL HIGH (ref 100–199)
HDL Cholesterol: 63 mg/dL (ref >39)
LDL, calculated: 122 mg/dL — ABNORMAL HIGH (ref 0–99)
Triglyceride: 84 mg/dL (ref 0–149)
VLDL, calculated: 17 mg/dL (ref 5–40)

## 2013-12-15 LAB — CVD REPORT

## 2013-12-15 MED ORDER — IOPAMIDOL 61 % IV SOLN
300 mg iodine /mL (61 %) | Freq: Once | INTRAVENOUS | Status: AC
Start: 2013-12-15 — End: 2013-12-15
  Administered 2013-12-15: 14:00:00 via INTRAVENOUS

## 2013-12-15 MED FILL — ISOVUE-300  61 % INTRAVENOUS SOLUTION: 300 mg iodine /mL (61 %) | INTRAVENOUS | Qty: 100

## 2013-12-19 MED ORDER — HYDROCODONE-ACETAMINOPHEN 5 MG-325 MG TAB
5-325 mg | ORAL_TABLET | Freq: Three times a day (TID) | ORAL | Status: DC | PRN
Start: 2013-12-19 — End: 2014-04-21

## 2013-12-19 MED ORDER — HYDROCODONE-ACETAMINOPHEN 5 MG-325 MG TAB
5-325 mg | ORAL | Status: AC
Start: 2013-12-19 — End: 2013-12-19
  Administered 2013-12-19: 14:00:00 via ORAL

## 2013-12-19 MED FILL — HYDROCODONE-ACETAMINOPHEN 5 MG-325 MG TAB: 5-325 mg | ORAL | Qty: 2

## 2013-12-19 NOTE — ED Notes (Signed)
"  I was playing foot ball with my grandson yesterday and I fell. My wrist is killing me."

## 2013-12-19 NOTE — ED Notes (Signed)
I have reviewed the discharge instructions with the patient. The patient verbalizes understanding.

## 2013-12-19 NOTE — ED Provider Notes (Signed)
HPI Comments: Patient is a 48 y.o. African American female with the following medical history:   Past Medical History:    Hypertension                                                                                               VAIN I (vaginal intraepithelial neoplasia grad*               Anxiety                                                       Lymphoma ([place])                                                                                              Cancer ([place])   Comment:lymph node cancer    Diabetes ([place])  Diet controlled                                              GERD (gastroesophageal reflux disease)                        Asthma                                                        Presents to the ED with left wrist pain since this morning when she woke up but states while she was playing football yesterday with her grandson she fell and believes she had her hand out stretched during the fall but still landed on part of the left side of her face.  It did not hurt yesterday but did hurt this morning to the point that she could not move it and she feels that the hand and fingers are starting to swell.  She denies loss or change of sensation, coldness of the extremity, previous injury or surgery on the wrist/arm or intentional injury.          Patient is a 48 y.o. female presenting with wrist pain. The history is provided by the patient.   Wrist Pain   This is a new problem. The current episode started 3 to 5 hours ago. The problem occurs constantly. The problem has been gradually worsening. The pain is present in the left wrist.  The quality of the pain is described as aching and pounding. The pain is at a severity of 10/10. The pain is severe. Associated symptoms include limited range of motion. Pertinent negatives include no numbness, no stiffness, no tingling, no itching, no back pain and no neck pain. The symptoms are aggravated by activity, contact, movement and palpation. She has tried nothing for  the symptoms. The treatment provided no relief. There has been a history of trauma.        Past Medical History   Diagnosis Date   ??? Hypertension    ??? Prediabetes    ??? VAIN I (vaginal intraepithelial neoplasia grade I)    ??? Anxiety    ??? Lymphoma ([place])    ??? Neck mass    ??? Cancer [facility])      lymph node cancer   ??? 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 GERD (gastroesophageal reflux disease)    ??? Asthma         Past Surgical History   Procedure Laterality Date   ??? Hx gyn       BTL   ??? Hx hysterectomy  2004   ??? Hx other surgical       lymph node biopsies neck   ??? Hx vascular access       double right chest mediport         Family History   Problem Relation Age of Onset   ??? Cancer Mother    ??? Breast Cancer Mother    ??? Cancer Father    ??? Alcohol abuse Neg Hx    ??? Arthritis-rheumatoid Neg Hx    ??? Asthma Neg Hx    ??? Bleeding Prob Neg Hx    ??? Diabetes Neg Hx    ??? Elevated Lipids Neg Hx    ??? Headache Neg Hx    ??? Heart Disease Neg Hx    ??? Hypertension Neg Hx    ??? Lung Disease Neg Hx    ??? Migraines Neg Hx    ??? Psychiatric Disorder Neg Hx    ??? Stroke Neg Hx    ??? Mental Retardation Neg Hx         History     Social History   ??? Marital Status: SINGLE     Spouse Name: N/A     Number of Children: N/A   ??? Years of Education: N/A     Occupational History   ??? Not on file.     Social History Main Topics   ??? Smoking status: Current Every Day Smoker -- 0.25 packs/day for 25 years   ??? Smokeless tobacco: Never Used   ??? Alcohol Use: 3.0 oz/week     6 Cans of beer per week      Comment: every 3 to 4 days   ??? Drug Use: Yes     Special: Marijuana      Comment: one or 2 puffs occasionally   ??? Sexual Activity: No     Other Topics Concern   ??? Not on file     Social History Narrative                  ALLERGIES: Review of patient's allergies indicates no known allergies.      Review of Systems   Constitutional: Negative for fever, chills, activity change and appetite change.   HENT: Negative for trouble swallowing and voice change.    Eyes:  Negative for  discharge, redness and visual disturbance.   Respiratory: Negative for cough, chest tightness and shortness of breath.    Cardiovascular: Negative for chest pain and leg swelling.   Gastrointestinal: Negative for nausea, vomiting, abdominal pain, diarrhea and constipation.   Genitourinary: Negative for dysuria, urgency, frequency, flank pain and difficulty urinating.   Musculoskeletal: Positive for arthralgias (left wrist). Negative for back pain, joint swelling, stiffness, neck pain and neck stiffness.   Skin: Negative for color change, itching, rash and wound.   Neurological: Negative for dizziness, tingling, speech difficulty, numbness and headaches.   Psychiatric/Behavioral: Negative for behavioral problems and agitation. The patient is not nervous/anxious.        Filed Vitals:    12/19/13 0844   BP: 114/76   Pulse: 98   Temp: 98.7 ??F (37.1 ??C)   Resp: 16   Height: 5\' 5"  (1.651 m)   Weight: 80.74 kg (178 lb)   SpO2: 98%            Physical Exam   Constitutional: She is oriented to person, place, and time. She appears well-developed and well-nourished. She appears distressed (facial grimace with exam).   Eyes: Conjunctivae are normal.   Neck: No tracheal deviation present.   Cardiovascular: Normal rate and regular rhythm.    Pulmonary/Chest: Effort normal. No respiratory distress.   Musculoskeletal: She exhibits edema and tenderness.        Right elbow: Normal.       Left elbow: Normal.        Right wrist: Normal. She exhibits no effusion, no crepitus, no deformity and no laceration.        Left wrist: She exhibits decreased range of motion, tenderness, bony tenderness and swelling. She exhibits no effusion, no crepitus, no deformity and no laceration.        Left forearm: She exhibits tenderness, bony tenderness and swelling. She exhibits no edema, no deformity and no laceration.        Arms:       Left hand: She exhibits decreased range of motion, tenderness and swelling. She exhibits no bony  tenderness, normal two-point discrimination, normal capillary refill, no deformity and no laceration. Normal sensation noted. Decreased strength noted. She exhibits finger abduction, thumb/finger opposition and wrist extension trouble.   Neurological: She is alert and oriented to person, place, and time.   Skin: Skin is warm and dry.   Psychiatric: She has a normal mood and affect. Her behavior is normal. Judgment and thought content normal.        MDM  Number of Diagnoses or Management Options  Fracture of radius, buckle, closed, left, initial encounter:   Diagnosis management comments: Differential: strain, strain, fracture    Review of radiographs show lateral left radial buckle fracture, closed, without tinting of skin or neurovascular compromise.  Discussed with Ms. Kernan and will provide oral pain medication since she was brought to the ED by EMS and will be getting a ride home.  She understands that she has a fracture and will need to be seen ASAP by ortho this week and will be given a splint to wear 24/7 and sling to elevate hand.      The encounter diagnosis was Fracture of radius, buckle, closed, left, initial encounter.        Procedures

## 2013-12-21 NOTE — Addendum Note (Signed)
Addended by: Burgess Estelle on: 12/21/2013 05:40 PM     Modules accepted: Orders

## 2013-12-21 NOTE — Progress Notes (Signed)
Hematology/medical oncology progress note    12/21/2013  Melissa Leblanc  Date of birth: [birth year] 69    PCP: Zackery Barefoot    Diagnosis: Hodgkin's lymphoma    [name] is a 48 year old woman who has completed a full course of systemic chemotherapy for Hodgkin's lymphoma.  A recent posttreatment evaluation included flow cytometry which showed no significant immunophenotypic abnormality in her peripheral cell types.  The CT scan of the neck dated 12/16/2013 shows a borderline enlarged left anterior mediastinal lymph node.  This is smaller than on previous exams..  There are scattered non-enlarged supraclavicular, jugular chain, and submandibular notes.  The CT scan through the chest, abdomen, and pelvis from 12/16/2013 shows minimal residual soft tissue attenuation in the anterior mediastinal fat at the level of the previously large anterior mediastinal mass.  No focal lesion in his management.  There is a borderline enlarged left interior mediastinal lymph node that is much smaller.  There are no other enlarged mediastinal lymph nodes.  There are no new or enlarging lymphadenopathy in the chest, abdomen, or pelvis.  At this time, I am recommending a repeat scan to rule out any evidence of metabolic activity in the borderline mediastinal lymph nodes that are remaining.  I will have her return to the clinic in 3 weeks to review the results of the PET scan.  If the PET scan is positive for residual disease she will be referred to radiation oncologist for mediastinal radiation therapy.  (I spent 35 min. Discussing the lab results and recommendations with patient.  Greater than 50% of the time was in counseling and coordination.)      Earnie Larsson A. Nadyne Coombes, MD, Rosalita Chessman

## 2013-12-27 NOTE — Progress Notes (Signed)
Quick Note:    Letter sent  ______

## 2013-12-27 NOTE — Progress Notes (Signed)
Quick Note:    Please send letter or call Pt and let them know labs are within normal limits and MAMM is great.  ______

## 2013-12-28 ENCOUNTER — Encounter

## 2013-12-28 MED ORDER — OXYCODONE-ACETAMINOPHEN 7.5 MG-325 MG TAB
ORAL_TABLET | Freq: Four times a day (QID) | ORAL | Status: DC | PRN
Start: 2013-12-28 — End: 2014-02-24

## 2013-12-30 MED ORDER — F-18 FLUORODEOXYGLUCOSE
Freq: Once | Status: AC
Start: 2013-12-30 — End: 2013-12-30
  Administered 2013-12-30: 12:00:00 via INTRAVENOUS

## 2013-12-30 MED FILL — F-18 FLUORODEOXYGLUCOSE: Qty: 14

## 2014-01-03 MED ORDER — HEPARIN LOCK FLUSH 100 UNIT/ML IV SOLN
100 unit/mL | INTRAVENOUS | Status: AC | PRN
Start: 2014-01-03 — End: 2014-01-04
  Administered 2014-01-03: 13:00:00 via INTRAVENOUS

## 2014-01-03 MED ORDER — SODIUM CHLORIDE 0.9 % IJ SYRG
INTRAMUSCULAR | Status: DC | PRN
Start: 2014-01-03 — End: 2014-01-07
  Administered 2014-01-03: 13:00:00 via INTRAVENOUS

## 2014-01-03 MED FILL — HEPARIN LOCK FLUSH 100 UNIT/ML IV SOLN: 100 unit/mL | INTRAVENOUS | Qty: 5

## 2014-01-03 MED FILL — BD POSIFLUSH NORMAL SALINE 0.9 % INJECTION SYRINGE: INTRAMUSCULAR | Qty: 40

## 2014-01-03 NOTE — Progress Notes (Signed)
[facility] OPIC Progress Note    Date: Jan 03, 2014    Name: Melissa Leblanc    MRN: [id number]         DOB: February 05, 1966    Monthly Port flush     [name] was assessed and education was provided.     [name]'s vitals were reviewed and patient was observed for 5 minutes prior to procedure.  Visit Vitals   Item Reading   ??? BP 122/85   ??? Pulse 85   ??? Temp 98.2 ??F (36.8 ??C)   ??? Resp 18   ??? SpO2 98%   ??? Breastfeeding No       Mediport was accessed with 20 gauge, short huber(s) after chloroprep.    left chest, single/double    Blood return: YES    0 of blood collected for labs per protocol.    Flushed 40 of NS followed by Heparin 500u    Huber needle(s) removed. Band-Aid applied.    [name] tolerated the procedure, and had no complaints.    [name] was discharged from [facility] in stable condition at [phone]. She is to return on 01/31/2014 at 0830 for her next appointment.    Summer Pearla Dubonnet, RN  Jan 03, 2014  8:38 AM

## 2014-01-14 NOTE — Progress Notes (Signed)
Hematology/medical oncology progress note    01/14/2014  Melissa Leblanc  Date of birth: 05/06/1966    PCP: Dr. Starleen Blue    Diagnosis: Hodgkin lymphoma    Melissa Leblanc is a 48 year old Afro-American woman who was diagnosed with Hodgkin lymphoma over a year ago.  She last received chemotherapy in August 2014.  She subsequently has undergone a restaging.  Recent CT scans were limited.  A PET scan dated 12/31/2013 was done to assess for any evidence of residual or recurrent disease.  The overall findings were that there was a significant decrease in lymphadenopathy and metabolic activity when compared to the previous pretreatment PET scan.  Only one left axillary lymph nodes demonstrated greater metabolic activity than the pretreatment activity.  I have recommended that we refer the patient to the radiation oncologist to see if a short course of radiation therapy would be recommended versus continuing active surveillance.  We discussed this issue in some detail and the patient has agreed to be seen by radiation oncology for further assessment.  Therefore we will make the necessary arrangements for a consultation with the Radiation Oncologist.  Total time 35 min. Greater than 50% of the time was in counseling and coordination of care.    [name] A. Nadyne Coombes, MD, Rosalita Chessman

## 2014-01-31 MED ORDER — SODIUM CHLORIDE 0.9 % IJ SYRG
INTRAMUSCULAR | Status: DC | PRN
Start: 2014-01-31 — End: 2014-02-04
  Administered 2014-01-31: 13:00:00 via INTRAVENOUS

## 2014-01-31 MED ORDER — HEPARIN LOCK FLUSH 100 UNIT/ML IV SOLN
100 unit/mL | INTRAVENOUS | Status: AC
Start: 2014-01-31 — End: 2014-01-31
  Administered 2014-01-31: 13:00:00 via INTRAVENOUS

## 2014-01-31 MED ORDER — HEPARIN LOCK FLUSH 100 UNIT/ML IV SOLN
100 unit/mL | INTRAVENOUS | Status: AC | PRN
Start: 2014-01-31 — End: 2014-02-01
  Administered 2014-01-31: 13:00:00 via INTRAVENOUS

## 2014-01-31 MED FILL — HEPARIN LOCK FLUSH 100 UNIT/ML IV SOLN: 100 unit/mL | INTRAVENOUS | Qty: 10

## 2014-01-31 MED FILL — BD POSIFLUSH NORMAL SALINE 0.9 % INJECTION SYRINGE: INTRAMUSCULAR | Qty: 40

## 2014-01-31 NOTE — Progress Notes (Signed)
[facility] OPIC Progress Note    Date: January 31, 2014    Name: Melissa Leblanc    MRN: [id number]         DOB: Jul 09, 1966      [name] arrived to [facility] at [phone].    [name] was assessed and education was provided.     [name]'s vitals were reviewed.  Visit Vitals   Item Reading   ??? BP 128/86   ??? Pulse 71   ??? Temp 98.5 ??F (36.9 ??C)   ??? Resp 18   ??? Breastfeeding No       Pt was observed for 5 minutes after obtaining vital signs prior to initiating treatment.    Each lumen of patient's upper right double lumen chest port accessed.  Blood return visualized in each lumen.  Flushed both lumens with normal saline followed by heparin per order.  De-accessed and gauze/ plastic tape applied to site.    [name] tolerated well without complaints.    [name] was discharged from [place] in stable condition at 0900.  She is to return on 02/28/14 at 0830 for her next appointment.    Clent Jacks, RN  January 31, 2014

## 2014-02-24 ENCOUNTER — Encounter

## 2014-02-24 MED ORDER — OXYCODONE-ACETAMINOPHEN 7.5 MG-325 MG TAB
ORAL_TABLET | Freq: Four times a day (QID) | ORAL | Status: DC | PRN
Start: 2014-02-24 — End: 2014-04-21

## 2014-02-28 MED ORDER — HEPARIN LOCK FLUSH 100 UNIT/ML IV SOLN
100 unit/mL | INTRAVENOUS | Status: AC
Start: 2014-02-28 — End: 2014-02-28
  Administered 2014-02-28: 13:00:00 via INTRAVENOUS

## 2014-02-28 MED ORDER — HEPARIN LOCK FLUSH 100 UNIT/ML IV SOLN
100 unit/mL | INTRAVENOUS | Status: AC | PRN
Start: 2014-02-28 — End: 2014-03-01

## 2014-02-28 MED ORDER — SODIUM CHLORIDE 0.9 % IJ SYRG
INTRAMUSCULAR | Status: DC | PRN
Start: 2014-02-28 — End: 2014-03-04
  Administered 2014-02-28: 13:00:00 via INTRAVENOUS

## 2014-02-28 MED FILL — HEPARIN LOCK FLUSH 100 UNIT/ML IV SOLN: 100 unit/mL | INTRAVENOUS | Qty: 5

## 2014-02-28 MED FILL — BD POSIFLUSH NORMAL SALINE 0.9 % INJECTION SYRINGE: INTRAMUSCULAR | Qty: 40

## 2014-02-28 NOTE — Progress Notes (Signed)
[facility] OPIC Progress Note    Date: February 28, 2014    Name: Melissa Leblanc    MRN: [id number]         DOB: 1966/03/13      [name] arrived to [facility] at [phone].    [name] was assessed and education was provided.     [name]'s vitals were reviewed.  Visit Vitals   Item Reading   ??? BP 125/82 mmHg   ??? Pulse 93   ??? Temp 98.5 ??F (36.9 ??C)   ??? Resp 16   ??? Breastfeeding No       Pt was observed for 5 minutes after obtaining vital signs prior to initiating treatment.    Each lumen of patient's upper right double lumen chest port accessed.  Blood return visualized in each lumen.  Flushed both lumens with normal saline followed by heparin per order.  De-accessed and gauze/ transparent dressing applied to site.    [name] tolerated well without complaints.    [name] was discharged from [place] in stable condition at 0930.  She is to return on 03/28/14 at 0830 for her next appointment.    Tressa Busman, RN  February 28, 2014

## 2014-03-28 MED ORDER — HEPARIN LOCK FLUSH 100 UNIT/ML IV SOLN
100 unit/mL | INTRAVENOUS | Status: AC
Start: 2014-03-28 — End: 2014-03-28
  Administered 2014-03-28: 13:00:00 via INTRAVENOUS

## 2014-03-28 MED ORDER — SODIUM CHLORIDE 0.9 % IJ SYRG
INTRAMUSCULAR | Status: DC | PRN
Start: 2014-03-28 — End: 2014-04-01
  Administered 2014-03-28: 13:00:00 via INTRAVENOUS

## 2014-03-28 MED ORDER — HEPARIN LOCK FLUSH 100 UNIT/ML IV SOLN
100 unit/mL | INTRAVENOUS | Status: AC | PRN
Start: 2014-03-28 — End: 2014-03-30
  Administered 2014-03-28: 13:00:00 via INTRAVENOUS

## 2014-03-28 MED FILL — HEPARIN LOCK FLUSH 100 UNIT/ML IV SOLN: 100 unit/mL | INTRAVENOUS | Qty: 10

## 2014-03-28 MED FILL — BD POSIFLUSH NORMAL SALINE 0.9 % INJECTION SYRINGE: INTRAMUSCULAR | Qty: 40

## 2014-03-28 NOTE — Progress Notes (Signed)
[facility] OPIC Progress Note    Date: March 28, 2014    Name: Melissa Leblanc    MRN: [id number]         DOB: 10-Jun-1966      [name] arrived to [facility] at [phone].    [name] was assessed and education was provided.     [name]'s vitals were reviewed.  Visit Vitals   Item Reading   ??? BP 119/82 mmHg   ??? Pulse 111   ??? Temp 98.6 ??F (37 ??C)   ??? Resp 18   ??? SpO2 98%   ??? Breastfeeding No       Pt was observed for 5 minutes after obtaining vital signs prior to initiating treatment.    Each lumen of patient's upper right double lumen chest port accessed.  Blood return visualized in each lumen.  Flushed both lumens with normal saline followed by heparin per order.  De-accessed and gauze/ transparent dressing applied to site.    [name] tolerated well without complaints.    [name] was discharged from [place] in stable condition at 0855.  She is to return on 04/25/14 at 0830 for her next appointment.    Tressa Busman, RN  March 28, 2014

## 2014-04-11 MED ORDER — LISINOPRIL-HYDROCHLOROTHIAZIDE 20 MG-25 MG TAB
20-25 mg | ORAL_TABLET | ORAL | Status: DC
Start: 2014-04-11 — End: 2014-10-21

## 2014-04-15 MED ORDER — TRAZODONE 50 MG TAB
50 mg | ORAL_TABLET | Freq: Every evening | ORAL | Status: DC
Start: 2014-04-15 — End: 2014-05-18

## 2014-04-15 NOTE — Patient Instructions (Signed)
Depression Treatment: After Your Visit  Your Care Instructions  Depression is a condition that affects the way you feel, think, and act. It causes symptoms such as low energy, loss of interest in daily activities, and sadness or grouchiness that goes on for a long time. Depression is very common and affects men and women of all ages.  Depression is a medical illness caused by changes in the natural chemicals in your brain. It is not a character flaw, and it does not mean that you are a bad or weak person. It does not mean that you are going crazy.  It is important to know that depression can be treated. Medicines, counseling, and self-care can all help. Many people do not get help because they are embarrassed or think that they will get over the depression on their own. But some people do not get better without treatment.  Follow-up care is a key part of your treatment and safety. Be sure to make and go to all appointments, and call your doctor if you are having problems. It's also a good idea to know your test results and keep a list of the medicines you take.  How can you care for yourself at home?  Learn about antidepressant medicines  Antidepressant medicines can improve or end the symptoms of depression. You may need to take the medicine for at least 6 months, and often longer. Keep taking your medicine even if you feel better. If you stop taking it too soon, your symptoms may come back or get worse.  You may start to feel better within 1 to 3 weeks of taking antidepressant medicine. But it can take as many as 6 to 8 weeks to see more improvement. Talk to your doctor if you have problems with your medicine or if you do not notice any improvement after 3 weeks.  Antidepressants can make you feel tired, dizzy, or nervous. Some people have dry mouth, constipation, headaches, sexual problems, an upset stomach, or diarrhea. Many of these side effects are mild and go away on  their own after you take the medicine for a few weeks. Some may last longer. Talk to your doctor if side effects bother you too much. You might be able to try a different medicine. If you are pregnant or breast-feeding, talk to your doctor about what medicines you can take.  Learn about counseling  In many cases, counseling can work as well as medicines to treat mild to moderate depression. Counseling is done by licensed mental health providers, such as psychologists, social workers, and some types of nurses. It can be done in one-on-one sessions or in a group setting. Many people find group sessions helpful.  Cognitive-behavioral therapy is a type of counseling. In this treatment therapy, you learn how to see and change unhelpful thinking styles that may be adding to your depression. Counseling and medicines often work well when used together.  To manage depression  ?? Be physically active. Getting 30 minutes of exercise each day is good for your body and your mind. Begin slowly if it is hard for you to get started. If you already exercise, keep it up.  ?? Plan something pleasant for yourself every day. Include activities that you have enjoyed in the past.  ?? Get enough sleep. Talk to your doctor if you have problems sleeping.  ?? Eat a balanced diet. If you do not feel hungry, eat small snacks rather than large meals.  ?? Do not drink alcohol, use   illegal drugs, or take medicines that your doctor has not prescribed for you. They may interfere with your treatment.  ?? Spend time with family and friends. It may help to speak openly about your depression with people you trust.  ?? Take your medicines exactly as prescribed. Call your doctor if you think you are having a problem with your medicine.  ?? Do not make major life decisions while you are depressed. Depression may change the way you think. You will be able to make better decisions after you feel better.   ?? Think positively. Challenge negative thoughts with statements such as "I am hopeful"; "Things will get better"; and "I can ask for the help I need." Write down these statements and read them often, even if you don't believe them yet.  ?? Be patient with yourself. It took time for your depression to develop, and it will take time for your symptoms to improve. Do not take on too much or be too hard on yourself.  ?? Learn all you can about depression from written and online materials.  ?? Check out behavioral health classes to learn more about dealing with depression.  ?? Keep the numbers for these national suicide hotlines: 1-800-273-TALK (1-800-273-8255) and 1-800-SUICIDE (1-800-784-2433). If you or someone you know talks about suicide or feeling hopeless, get help right away.  When should you call for help?  Call 911 anytime you think you may need emergency care. For example, call if:  ?? You feel you cannot stop from hurting yourself or someone else.  Call your doctor now or seek immediate medical care if:  ?? You hear voices.  ?? You feel much more depressed.  Watch closely for changes in your health, and be sure to contact your doctor if:  ?? You are having problems with your depression medicine.  ?? You are not getting better as expected.   Where can you learn more?   Go to http://www.healthwise.net/[facility]  Enter G693 in the search box to learn more about "Depression Treatment: After Your Visit."   ?? 2006-2015 Healthwise, Incorporated. Care instructions adapted under license by [employer] (which disclaims liability or warranty for this information). This care instruction is for use with your licensed healthcare professional. If you have questions about a medical condition or this instruction, always ask your healthcare professional. Healthwise, Incorporated disclaims any warranty or liability for your use of this information.  Content Version: 10.5.422740; Current as of: July 02, 2013
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
## 2014-04-15 NOTE — Progress Notes (Signed)
Patient is currently not taking the following medications:  n/a    Learning Assessment (baseline):complete  Depression Screening: complete  Ebola Screening: Completed    1. Have you been to the ER, urgent care clinic since your last visit?  Hospitalized since your last visit?No    2. Have you seen or consulted any other health care providers outside of the [facility] since your last visit?  Include any pap smears or colon screening. Yes Dr. Nadyne Coombes      Patient is due for the following immunizations:      INFLUENZA: declined

## 2014-04-21 ENCOUNTER — Encounter

## 2014-04-21 MED ORDER — OXYCODONE-ACETAMINOPHEN 10 MG-325 MG TAB
10-325 mg | ORAL_TABLET | Freq: Four times a day (QID) | ORAL | Status: DC | PRN
Start: 2014-04-21 — End: 2014-04-27

## 2014-04-21 NOTE — Telephone Encounter (Signed)
Patient called today requesting refill for oxyCodone-acetaminophen. Would like to know if Dr. Nadyne Coombes will up her dosage?

## 2014-04-27 MED ORDER — OXYCODONE-ACETAMINOPHEN 7.5 MG-325 MG TAB
ORAL_TABLET | Freq: Four times a day (QID) | ORAL | Status: DC | PRN
Start: 2014-04-27 — End: 2014-04-27

## 2014-04-27 MED ORDER — OXYCODONE-ACETAMINOPHEN 10 MG-325 MG TAB
10-325 mg | ORAL_TABLET | Freq: Four times a day (QID) | ORAL | Status: DC | PRN
Start: 2014-04-27 — End: 2014-06-14

## 2014-04-28 MED ORDER — SODIUM CHLORIDE 0.9 % IJ SYRG
INTRAMUSCULAR | Status: DC | PRN
Start: 2014-04-28 — End: 2014-05-02

## 2014-04-28 MED ORDER — HEPARIN, PORCINE (PF) 100 UNIT/ML IV SYRINGE
100 unit/mL | INTRAVENOUS | Status: AC | PRN
Start: 2014-04-28 — End: 2014-04-29

## 2014-04-28 MED FILL — BD POSIFLUSH NORMAL SALINE 0.9 % INJECTION SYRINGE: INTRAMUSCULAR | Qty: 40

## 2014-04-28 MED FILL — HEPARIN LOCK FLUSH (PORCINE) (PF) 100 UNIT/ML INTRAVENOUS SYRINGE: 100 unit/mL | INTRAVENOUS | Qty: 5

## 2014-04-28 NOTE — Progress Notes (Signed)
[facility] Progress Note    Date: April 28, 2014    Name: Melissa Leblanc    MRN: [id number]         DOB: 20-Oct-1965      [name] arrived to [facility] at [phone].    [name] was assessed and education was provided.     [name]'s vitals were reviewed.  Visit Vitals   Item Reading   ??? BP 119/80 mmHg   ??? Pulse 86   ??? Temp 98.8 ??F (37.1 ??C)   ??? Resp 18   ??? SpO2 98%   ??? Breastfeeding No       Pt was observed for 5 minutes after obtaining vital signs prior to initiating treatment.    Each lumen of patient's upper right double lumen chest port accessed.  Blood return visualized in each lumen.  Flushed both lumens with normal saline followed by heparin per order.  De-accessed and bandaid applied to site.    [name] tolerated well without complaints.    [name] was discharged from [facility] in stable condition at 0955.  She is to return on 05/26/14 at 0800 for her next appointment.    Chrissie Noa, RN  April 28, 2014

## 2014-05-18 MED ORDER — TRAZODONE 50 MG TAB
50 mg | ORAL_TABLET | Freq: Every evening | ORAL | Status: DC
Start: 2014-05-18 — End: 2014-11-16

## 2014-05-18 NOTE — Progress Notes (Signed)
Patient is currently not taking the following medications:  n/a    Learning Assessment (baseline):complete  Depression Screening: complete  Ebola Screening: Completed    1. Have you been to the ER, urgent care clinic since your last visit?  Hospitalized since your last visit?No    2. Have you seen or consulted any other health care providers outside of the [facility] since your last visit?  Include any pap smears or colon screening. No      Patient is due for the following immunizations:      INFLUENZA: declined

## 2014-05-18 NOTE — Patient Instructions (Signed)
Self-Care While You Recover From Depression: After Your Visit  Your Care Instructions  Taking good care of yourself is important as you recover from depression. In time, your symptoms will fade as your treatment takes hold. Do not give up. Instead, focus your energy on getting better.  Your mood will improve. It just takes some time. Focus on things that can help you feel better, such as being with friends and family, eating well, and getting enough rest. But take things slowly. Do not do too much too soon. You will begin to feel better gradually.  Follow-up care is a key part of your treatment and safety. Be sure to make and go to all appointments, and call your doctor if you are having problems. It's also a good idea to know your test results and keep a list of the medicines you take.  How can you care for yourself at home?  Be realistic  ?? If you have a large task to do, break it up into smaller steps you can handle, and just do what you can.  ?? You may want to put off important decisions until your depression has lifted. If you have plans that will have a major impact on your life, such as marriage, divorce, or a job change, try to wait a bit. Talk it over with friends and loved ones who can help you look at the overall picture first.  ?? Reaching out to people for help is important. Do not isolate yourself. Let your family and friends help you. Find someone you can trust and confide in, and talk to that person.  ?? Be patient, and be kind to yourself. Remember that depression is not your fault and is not something you can overcome with willpower alone. Treatment is necessary for depression, just like for any other illness. Feeling better takes time, and your mood will improve little by little.  Stay active  ?? Stay busy and get outside. Take a walk, or try some other light exercise.  ?? Talk with your doctor about an exercise program. Exercise can help with mild depression.   ?? Go to a movie or concert. Take part in a church activity or other social gathering. Go to a ball game.  ?? Ask a friend to have dinner with you.  Take care of yourself  ?? Eat a balanced diet with plenty of fresh fruits and vegetables, whole grains, and lean protein. If you have lost your appetite, eat small snacks rather than large meals.  ?? Avoid drinking alcohol or using illegal drugs. Do not take medicines that have not been prescribed for you. They may interfere with medicines you may be taking for depression, or they may make your depression worse.  ?? Take your medicines exactly as they are prescribed. You may start to feel better within 1 to 3 weeks of taking antidepressant medicine. But it can take as many as 6 to 8 weeks to see more improvement. If you have questions or concerns about your medicines, or if you do not notice any improvement by 3 weeks, talk to your doctor.  ?? If you have any side effects from your medicine, tell your doctor. Antidepressants can make you feel tired, dizzy, or nervous. Some people have dry mouth, constipation, headaches, sexual problems, or diarrhea. Many of these side effects are mild and will go away on their own after you have been taking the medicine for a few weeks. Some may last longer. Talk to your doctor   if side effects are bothering you too much. You might be able to try a different medicine.  ?? Get enough sleep. If you have problems sleeping:  ?? Go to bed at the same time every night, and get up at the same time every morning.  ?? Keep your bedroom dark and quiet.  ?? Do not exercise after 5:00 p.m.  ?? Avoid drinks with caffeine after 5:00 p.m.  ?? Avoid sleeping pills unless they are prescribed by the doctor treating your depression. Sleeping pills may make you groggy during the day, and they may interact with other medicine you are taking.  ?? If you have any other illnesses, such as diabetes, heart disease, or  high blood pressure, make sure to continue with your treatment. Tell your doctor about all of the medicines you take, including those with or without a prescription.  ?? Keep the numbers for these national suicide hotlines: 1-800-273-TALK (1-800-273-8255) and 1-800-SUICIDE (1-800-784-2433). If you or someone you know talks about suicide or feeling hopeless, get help right away.  When should you call for help?  Call 911 anytime you think you may need emergency care. For example, call if:  ?? You feel like hurting yourself or someone else.  ?? Someone you know has depression and is about to attempt or is attempting suicide.  Call your doctor now or seek immediate medical care if:  ?? You hear voices.  ?? Someone you know has depression and:  ?? Starts to give away his or her possessions.  ?? Uses illegal drugs or drinks alcohol heavily.  ?? Talks or writes about death, including writing suicide notes or talking about guns, knives, or pills.  ?? Starts to spend a lot of time alone.  ?? Acts very aggressively or suddenly appears calm.  Watch closely for changes in your health, and be sure to contact your doctor if:  ?? You do not get better as expected.   Where can you learn more?   Go to http://www.healthwise.net/[facility]  Enter N529 in the search box to learn more about "Self-Care While You Recover From Depression: After Your Visit."   ?? 2006-2015 Health[employer], Inc[employer]. Care instructions adapted under license by  (which disclaims liability or warranty for this information). This care instruction is for use with your licensed healthcare professional. If you have questions about a medical condition or this instruction, always ask your healthcare professional. [employer], [employer] disclaims any warranty or liability for your use of this information.  Content Version: 10.5.422740; Current as of: July 02, 2013
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
## 2014-05-24 ENCOUNTER — Inpatient Hospital Stay: Admit: 2014-05-24 | Primary: Family

## 2014-05-24 ENCOUNTER — Ambulatory Visit: Admit: 2014-05-24 | Discharge: 2014-05-24 | Payer: MEDICARE | Attending: Hematology & Oncology | Primary: Family

## 2014-05-24 DIAGNOSIS — C8191 Hodgkin lymphoma, unspecified, lymph nodes of head, face, and neck: Secondary | ICD-10-CM

## 2014-05-24 LAB — CBC WITH 3 PART DIFF
ABS. LYMPHOCYTES: 1.5 10*3/uL (ref 1.1–5.9)
ABS. MIXED CELLS: 0.8 10*3/uL (ref 0.0–2.3)
ABS. NEUTROPHILS: 2.6 10*3/uL (ref 1.8–9.5)
HCT: 41.6 % (ref 36–48)
HGB: 14.3 g/dL (ref 12.0–16.0)
LYMPHOCYTES: 31 % (ref 14–44)
MCH: 28.4 PG (ref 25.0–35.0)
MCHC: 34.4 g/dL (ref 31–37)
MCV: 82.7 FL (ref 78–102)
Mixed cells: 16 % (ref 0.1–17)
NEUTROPHILS: 53 % (ref 40–70)
PLATELET: 218 10*3/uL (ref 140–440)
RBC: 5.03 M/uL (ref 4.10–5.10)
RDW: 16.8 % — ABNORMAL HIGH (ref 11.5–14.5)
WBC: 4.9 10*3/uL (ref 4.5–13.0)

## 2014-05-24 MED ORDER — CODEINE-GUAIFENESIN 10 MG-100 MG/5 ML ORAL LIQUID
100-10 mg/5 mL | Freq: Three times a day (TID) | ORAL | Status: DC | PRN
Start: 2014-05-24 — End: 2015-03-24

## 2014-05-24 NOTE — Progress Notes (Signed)
Hematology/Oncology  Progress Note    Name: Melissa Leblanc  Date: 05/24/2014  DOB: 16-Feb-1966    QPY:PPJKD M. Starleen Blue, DO     Melissa Leblanc is a 48 year old female who was seen for management of her non-Hodgkin's lymphoma.  Current therapy: active surveillance, the patient previously completed 8 cycles of systemic chemotherapy with the Rituxan and CHOP regimen    Subjective:     Melissa Leblanc is a 48 year old African American woman has a history of non-Hodgkin's lymphoma. She has previously completed a full course of systemic chemotherapy with a combination of Rituxan and CHOP. Today she is complaining of upper respiratory symptoms with cough and mild runny nose. She also has a low-grade fever but denies having any night sweats. Her appetite remains good and her weight is slowly continuing to increase. She reports having the left supraclavicular lymph node occasionally enlarging followed by regression. There no other complaints.    Past medical history, family history, and social history: these were reviewed and remains unchanged.    Past Medical History   Diagnosis Date   ??? Hypertension    ??? Prediabetes    ??? VAIN I (vaginal intraepithelial neoplasia grade I)    ??? Anxiety    ??? Lymphoma ([place])    ??? Neck mass    ??? Cancer [facility])      lymph node cancer   ??? 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 GERD (gastroesophageal reflux disease)    ??? Asthma      Past Surgical History   Procedure Laterality Date   ??? Hx gyn       BTL   ??? Hx hysterectomy  2004   ??? Hx other surgical       lymph node biopsies neck   ??? Hx vascular access       double right chest mediport     History     Social History   ??? Marital Status: SINGLE     Spouse Name: N/A     Number of Children: N/A   ??? Years of Education: N/A     Occupational History   ??? Not on file.     Social History Main Topics   ??? Smoking status: Current Every Day Smoker -- 0.25 packs/day for 25 years   ??? Smokeless tobacco: Never Used   ??? Alcohol Use: 3.0 oz/week     6 Cans of beer per week       Comment: every 3 to 4 days   ??? Drug Use: Yes     Special: Marijuana      Comment: one or 2 puffs occasionally   ??? Sexual Activity: No     Other Topics Concern   ??? Not on file     Social History Narrative     Family History   Problem Relation Age of Onset   ??? Cancer Mother    ??? Breast Cancer Mother    ??? Cancer Father    ??? Alcohol abuse Neg Hx    ??? Arthritis-rheumatoid Neg Hx    ??? Asthma Neg Hx    ??? Bleeding Prob Neg Hx    ??? Diabetes Neg Hx    ??? Elevated Lipids Neg Hx    ??? Headache Neg Hx    ??? Heart Disease Neg Hx    ??? Hypertension Neg Hx    ??? Lung Disease Neg Hx    ??? Migraines Neg Hx    ???  Psychiatric Disorder Neg Hx    ??? Stroke Neg Hx    ??? Mental Retardation Neg Hx      Current Outpatient Prescriptions   Medication Sig Dispense Refill   ??? guaiFENesin-codeine (ROBITUSSIN AC) 10-100 mg/5 mL solution Take 5 mL by mouth three (3) times daily as needed for Cough. Max Daily Amount: 15 mL. 240 mL 3   ??? traZODone (DESYREL) 50 mg tablet Take 1-2 Tabs by mouth nightly. 180 Tab 1   ??? oxyCODONE-acetaminophen (PERCOCET 10) 10-325 mg per tablet Take 1 Tab by mouth every six (6) hours as needed for Pain. Max Daily Amount: 4 Tabs. 120 Tab 0   ??? lisinopril-hydrochlorothiazide (PRINZIDE, ZESTORETIC) 20-25 mg per tablet take 1 tablet by mouth once daily 90 Tab 1   ??? warfarin (COUMADIN) 1 mg tablet Take 1 tab po qd at 6pm or after 30 tablet 6   ??? lidocaine-prilocaine (EMLA) topical cream Apply to mediport 1 hour prior to chemo 30 g 6   ??? cyclobenzaprine (FLEXERIL) 5 mg tablet Take 1 Tab by mouth three (3) times daily as needed for Muscle Spasm(s). 90 Tab 3   ??? prochlorperazine (COMPAZINE) 10 mg tablet Take 1 tab po Q 6 hours with Benadryl 53m for nausea 60 Tab 2   ??? diphenhydrAMINE (BENADRYL) 25 mg capsule Take 1 tab po Q 6 hours with compazine for nausea 30 Cap 2   ??? ibuprofen (MOTRIN) 800 mg tablet Take 1 Tab by mouth three (3) times daily as needed for Pain. 40 Tab 0       Review of Systems   Constitutional: The patient has no acute distress or discomfort.  HEENT: The patient denies recent head trauma, eye pain, blurred vision,  hearing deficit, oropharyngeal mucosal pain or lesions, and the patient denies throat pain or discomfort.  Lymphatics: The patient denies palpable peripheral lymphadenopathy.  Hematologic: The patient denies having bruising, bleeding, or progressive fatigue.  Respiratory: the patient is complaining of some cough, low-grade fever and running nose.  Cardiovascular: The patient denies having leg pain, leg swelling, heart palpitations, chest permit, chest pain, or lightheadedness.  The patient denies having dyspnea on exertion.  Gastrointestinal: The patient denies having nausea, emesis, or diarrhea. The patient denies having any hematemesis or blood in the stool.  Genitourinary: Patient denies having urinary urgency, frequency, or dysuria.  The patient denies having blood in the urine.  Psychological: The patient denies having symptoms of nervousness, anxiety, depression, or thoughts of harming himself some of this.  Skin: Patient denies having skin rashes, skin, ulcerations, or unexplained itching or pruritus.  Musculoskeletal: The patient denies having pain in the joints or bones.      Objective:   BP 113/78 mmHg   Pulse 91   Temp(Src) 99 ??F (37.2 ??C)   Wt 83.008 kg (183 lb)  ECOG PS=0, pain score=0/10    Physical Exam:   Gen. Appearance: The patient is in no acute distress.  Skin: There is no bruise or rash.  HEENT: The exam is unremarkable.  Neck: Supple without lymphadenopathy or thyromegaly.  Lungs: Clear to auscultation and percussion; there are no wheezes or rhonchi.  Heart: Regular rate and rhythm; there are no murmurs, gallops, or rubs.  Abdomen: Bowel sounds are present and normal.  There is no guarding, tenderness, or hepatosplenomegaly.  Extremities: There is no clubbing, cyanosis, or edema.  Neurologic: There are no focal neurologic deficits.  Lymphatics:  There is no palpable peripheral lymphadenopathy. Musculoskeletal: The patient has full  range of motion at all joints.  There is no evidence of joint deformity or effusions.  There is no focal joint tenderness.  Psychological/psychiatric: There is no clinical evidence of anxiety, depression, or melancholy.    Lab data:      Results for orders placed or performed during the hospital encounter of 05/24/14   CBC WITH 3 PART DIFF     Status: Abnormal   Result Value Ref Range Status    WBC 4.9 4.5 - 13.0 K/uL Final    RBC 5.03 4.10 - 5.10 M/uL Final    HGB 14.3 12.0 - 16.0 g/dL Final    HCT 41.6 36 - 48 % Final    MCV 82.7 78 - 102 FL Final    MCH 28.4 25.0 - 35.0 PG Final    MCHC 34.4 31 - 37 g/dL Final  RDW 16.8 (H) 11.5 - 14.5 % Final    PLATELET 218 140 - 440 K/uL Final    NEUTROPHILS 53 40 - 70 % Final    MIXED CELLS 16 0.1 - 17 % Final    LYMPHOCYTES 31 14 - 44 % Final    ABS. NEUTROPHILS 2.6 1.8 - 9.5 K/UL Final    ABS. MIXED CELLS 0.8 0.0 - 2.3 K/uL Final    ABS. LYMPHOCYTES 1.5 1.1 - 5.9 K/UL Final     Comment: Test Performed by [facility]. Results reviewed by Medical Director.    DF AUTOMATED   Final           Assessment:     1. Hodgkin's disease of head, face, and neck (HCC)    2. Leukopenia    3. Iron deficiency anemia    4. Acute URI      Plan:   Non-Hodgkin's lymphoma: The patient will be referred for CT scans of the neck, chest, abdomen, and pelvis. If there is any evidence of recurrent disease or suspicious lymphadenopathy a PET scan will be offered as a backup assessment versus lymph node biopsy. The comprehensive metabolic panel also be noted.    Leukopenia: The current CBC shows her leukopenia has resolved. Her current WBC count was 4.9.    Iron deficiency anemia: The patient has continued to take the over-the-counter iron supplement once daily. Her current hemoglobin is 14.3 g/dL with hematocrit of 41.6. I recommended that she continue to take an iron supplement once daily.     Acute URI: Robitussin with codeine cough syrup will be recommended. I have also recommended the patient take Tylenol, extra strength, every 4-6 hours to control her low-grade fevers. She was instructed to drink any liquids and to get adequate rest. I have explained to the patient that her symptoms are consistent with a viral upper respiratory infection.    Total time 40 minutes greater than 50% of the time was in counseling and coordination of care. I will have the patient return to clinic in 2 weeks after the CT scans have been completed.  Orders Placed This Encounter   ??? COMPLETE CBC & AUTO DIFF WBC   ??? InHouse CBC ([facility])     Standing Status: Future      Number of Occurrences: 1    Standing Expiration Date: 09/38/1829   ??? METABOLIC PANEL, COMPREHENSIVE   ??? SED RATE (ESR)   ??? IRON PROFILE   ??? FERRITIN   ??? guaiFENesin-codeine (ROBITUSSIN AC) 10-100 mg/5 mL solution     Sig: Take 5 mL by mouth three (3) times daily as needed  for Cough. Max Daily Amount: 15 mL.     Dispense:  240 mL     Refill:  [place], MD  05/24/2014      Please note: This document has been produced using voice recognition software.  Unrecognized errors in transcription may be present.

## 2014-05-24 NOTE — Progress Notes (Signed)
Quick Note:        Results reviewed and noted.    ______

## 2014-05-25 LAB — IRON PROFILE
Iron % saturation: 28 % (ref 15–55)
Iron: 86 ug/dL (ref 35–155)
TIBC: 310 ug/dL (ref 250–450)
UIBC: 224 ug/dL (ref 150–375)

## 2014-05-25 LAB — METABOLIC PANEL, COMPREHENSIVE
A-G Ratio: 1.4 (ref 1.1–2.5)
ALT (SGPT): 16 IU/L (ref 0–32)
AST (SGOT): 22 IU/L (ref 0–40)
Albumin: 4.4 g/dL (ref 3.5–5.5)
Alk. phosphatase: 69 IU/L (ref 39–117)
BUN/Creatinine ratio: 16 (ref 9–23)
BUN: 13 mg/dL (ref 6–24)
Bilirubin, total: 0.2 mg/dL (ref 0.0–1.2)
CO2: 21 mmol/L (ref 18–29)
Calcium: 9.9 mg/dL (ref 8.7–10.2)
Chloride: 96 mmol/L — ABNORMAL LOW (ref 97–108)
Creatinine: 0.8 mg/dL (ref 0.57–1.00)
GFR est AA: 102 mL/min/{1.73_m2} (ref >59)
GFR est non-AA: 88 mL/min/{1.73_m2} (ref >59)
GLOBULIN, TOTAL: 3.2 g/dL (ref 1.5–4.5)
Glucose: 58 mg/dL — ABNORMAL LOW (ref 65–99)
Potassium: 5.1 mmol/L (ref 3.5–5.2)
Protein, total: 7.6 g/dL (ref 6.0–8.5)
Sodium: 137 mmol/L (ref 134–144)

## 2014-05-25 LAB — FERRITIN: Ferritin: 129 ng/mL (ref 15–150)

## 2014-05-25 LAB — SED RATE (ESR): Sed rate (ESR): 21 mm/hr (ref 0–32)

## 2014-05-25 NOTE — Progress Notes (Signed)
Quick Note:        Results reviewed and noted.    ______

## 2014-05-26 ENCOUNTER — Inpatient Hospital Stay: Admit: 2014-05-26 | Payer: MEDICARE | Primary: Family

## 2014-05-26 DIAGNOSIS — Z452 Encounter for adjustment and management of vascular access device: Secondary | ICD-10-CM

## 2014-05-26 MED ORDER — HEPARIN, PORCINE (PF) 100 UNIT/ML IV SYRINGE
100 unit/mL | INTRAVENOUS | Status: AC | PRN
Start: 2014-05-26 — End: 2014-05-27
  Administered 2014-05-26 (×2): via INTRAVENOUS

## 2014-05-26 MED ORDER — SODIUM CHLORIDE 0.9 % IJ SYRG
INTRAMUSCULAR | Status: DC | PRN
Start: 2014-05-26 — End: 2014-05-30
  Administered 2014-05-26: 13:00:00 via INTRAVENOUS

## 2014-05-26 MED FILL — HEPARIN LOCK FLUSH (PORCINE) (PF) 100 UNIT/ML INTRAVENOUS SYRINGE: 100 unit/mL | INTRAVENOUS | Qty: 10

## 2014-05-26 MED FILL — BD POSIFLUSH NORMAL SALINE 0.9 % INJECTION SYRINGE: INTRAMUSCULAR | Qty: 40

## 2014-05-26 NOTE — Progress Notes (Signed)
[facility] OPIC Progress Note    Date: May 26, 2014    Name: Melissa Leblanc    MRN: [id number]         DOB: Oct 03, 1965      [name] arrived to [facility] at [phone].    [name] was assessed and education was provided.     [name]'s vitals were reviewed.  Visit Vitals   Item Reading   ??? BP 131/85 mmHg   ??? Pulse 81   ??? Temp 97.8 ??F (36.6 ??C)   ??? Resp 18   ??? Breastfeeding No       Pt was observed for 5 minutes after obtaining vital signs prior to initiating treatment.    Each lumen of patient's upper right double lumen chest port accessed.  Blood return visualized in each lumen.  Flushed both lumens with normal saline followed by heparin per order.  De-accessed and gauze/ band-aid applied to site.    [name] tolerated well without complaints.    [name] was discharged from [place] in stable condition at [phone].  She is to return on 06/23/14 at 0800 for her next appointment.    Clent Jacks, RN  May 26, 2014

## 2014-06-01 ENCOUNTER — Inpatient Hospital Stay: Admit: 2014-06-01 | Payer: MEDICARE | Attending: Hematology & Oncology | Primary: Family

## 2014-06-01 DIAGNOSIS — C8191 Hodgkin lymphoma, unspecified, lymph nodes of head, face, and neck: Secondary | ICD-10-CM

## 2014-06-01 DIAGNOSIS — C8190 Hodgkin lymphoma, unspecified, unspecified site: Secondary | ICD-10-CM

## 2014-06-01 MED ORDER — IOPAMIDOL 61 % IV SOLN
300 mg iodine /mL (61 %) | Freq: Once | INTRAVENOUS | Status: AC
Start: 2014-06-01 — End: 2014-06-01
  Administered 2014-06-01: 12:00:00 via INTRAVENOUS

## 2014-06-01 MED FILL — ISOVUE-300  61 % INTRAVENOUS SOLUTION: 300 mg iodine /mL (61 %) | INTRAVENOUS | Qty: 100

## 2014-06-14 ENCOUNTER — Ambulatory Visit: Admit: 2014-06-14 | Discharge: 2014-06-14 | Payer: MEDICARE | Attending: Hematology & Oncology | Primary: Family

## 2014-06-14 DIAGNOSIS — C8191 Hodgkin lymphoma, unspecified, lymph nodes of head, face, and neck: Secondary | ICD-10-CM

## 2014-06-14 MED ORDER — OXYCODONE-ACETAMINOPHEN 10 MG-325 MG TAB
10-325 mg | ORAL_TABLET | Freq: Four times a day (QID) | ORAL | Status: DC | PRN
Start: 2014-06-14 — End: 2014-07-26

## 2014-06-14 NOTE — Progress Notes (Signed)
Hematology/medical oncology progress note    June 14, 2014  Melissa Leblanc  Date of birth: 12-Dec-1965    PCP: Isabell Jarvis    Diagnosis: Hodgkin's lymphoma    Ms. Renton is a 48 year old African American woman with a history of Hodgkin's lymphoma. On June 01, 2014 the patient had a CT scan of the chest, abdomen, and pelvis to rule out any evidence of recurrent lymphoma. The imaging studies did not reveal any evidence of lymphadenopathy or recurrent disease. She does have a small stable pulmonary nodule which is marginally decreased in size. There was also a new subpleural 4 mm nonspecific nodule as well. Additionally she has stable mild anterior mediastinum partially calcified soft tissue and stable sub-centimeter mediastinal lymph nodes. The small axillary lymph nodes are also stable and there is no new lymphadenopathy. The patient has a left ovarian physiologic cysts. Therefore, I have reassured the patient that the imaging studies did not show any evidence of recurrent lymphoma. Follow-up will resume at 4 month intervals. Total time 25 minutes greater than 50% of the time is in counseling and coordination of care. The patient has requested a refill of her narcotic based pain medication, percocet.    Alorah Mcree A. Nadyne Coombes, MD, Rosalita Chessman

## 2014-06-23 ENCOUNTER — Encounter: Payer: MEDICARE | Primary: Family

## 2014-06-24 ENCOUNTER — Inpatient Hospital Stay: Admit: 2014-06-24 | Payer: MEDICARE | Primary: Family

## 2014-06-24 DIAGNOSIS — Z452 Encounter for adjustment and management of vascular access device: Secondary | ICD-10-CM

## 2014-06-24 MED ORDER — SODIUM CHLORIDE 0.9 % IJ SYRG
INTRAMUSCULAR | Status: DC | PRN
Start: 2014-06-24 — End: 2014-06-28
  Administered 2014-06-24: 17:00:00 via INTRAVENOUS

## 2014-06-24 MED ORDER — HEPARIN LOCK FLUSH 100 UNIT/ML IV SOLN
100 unit/mL | INTRAVENOUS | Status: AC | PRN
Start: 2014-06-24 — End: 2014-06-25
  Administered 2014-06-24: 17:00:00 via INTRAVENOUS

## 2014-06-24 MED FILL — HEPARIN LOCK FLUSH 100 UNIT/ML IV SOLN: 100 unit/mL | INTRAVENOUS | Qty: 10

## 2014-06-24 MED FILL — BD POSIFLUSH NORMAL SALINE 0.9 % INJECTION SYRINGE: INTRAMUSCULAR | Qty: 40

## 2014-06-24 NOTE — Progress Notes (Signed)
[facility] OPIC Progress Note    Date: June 24, 2014    Name: Melissa Leblanc    MRN: [id number]         DOB: 02/04/1966      [name] arrived to [facility] at 1140.    [name] was assessed and education was provided.     [name]'s vitals were reviewed.  Visit Vitals   Item Reading   ??? BP 150/95 mmHg   ??? Pulse 77   ??? Temp 98.4 ??F (36.9 ??C)   ??? Resp 18   ??? Ht 5\' 5"  (1.651 m)   ??? Wt 85.14 kg (187 lb 11.2 oz)   ??? BMI 31.23 kg/m2   ??? SpO2 100%   ??? Breastfeeding No       Pt was observed for 5 minutes after obtaining vital signs prior to initiating treatment.    Each lumen of patient's upper right double lumen chest port accessed.  Blood return visualized in each lumen. Lateral lumen was accessed after 3 attempts, by Tandy Gaw, Therapist, sports.  Flushed both lumens with normal saline followed by heparin per order.  De-accessed and bandaid applied to sites. [name] tolerated with some difficulty.    [name] was discharged from [place] in stable condition at 1205.She is to return on 07/22/14 at 1300 for her next appointment.    Chrissie Noa, RN  June 24, 2014

## 2014-07-22 ENCOUNTER — Encounter: Payer: MEDICARE | Primary: Family

## 2014-07-25 ENCOUNTER — Inpatient Hospital Stay: Admit: 2014-07-25 | Payer: MEDICARE | Primary: Family

## 2014-07-25 DIAGNOSIS — Z452 Encounter for adjustment and management of vascular access device: Secondary | ICD-10-CM

## 2014-07-25 MED ORDER — HEPARIN LOCK FLUSH 100 UNIT/ML IV SOLN
100 unit/mL | INTRAVENOUS | Status: AC | PRN
Start: 2014-07-25 — End: 2014-07-26
  Administered 2014-07-25 (×2): via INTRAVENOUS

## 2014-07-25 MED ORDER — SODIUM CHLORIDE 0.9 % IJ SYRG
INTRAMUSCULAR | Status: DC | PRN
Start: 2014-07-25 — End: 2014-07-29
  Administered 2014-07-25: 15:00:00 via INTRAVENOUS

## 2014-07-25 MED FILL — HEPARIN LOCK FLUSH 100 UNIT/ML IV SOLN: 100 unit/mL | INTRAVENOUS | Qty: 10

## 2014-07-25 MED FILL — BD POSIFLUSH NORMAL SALINE 0.9 % INJECTION SYRINGE: INTRAMUSCULAR | Qty: 40

## 2014-07-25 NOTE — Progress Notes (Signed)
[facility] OPIC Progress Note    Date: July 25, 2014    Name: Melissa Leblanc    MRN: [id number]         DOB: 1965/09/07      [name] arrived to [facility] at [phone].    [name] was assessed and education was provided.     [name]'s vitals were reviewed.  Visit Vitals   Item Reading   ??? BP 141/93 mmHg   ??? Pulse 77   ??? Temp 98.4 ??F (36.9 ??C)   ??? Resp 18   ??? SpO2 100%   ??? Breastfeeding No       Each lumen of patient's upper Right double lumen chest port accessed.  Blood return visualized in each lumen.  Flushed both lumens with normal saline followed by heparin per order.  De-accessed and band-aid applied to site.    [name] tolerated well without complaints.    [name] was discharged from [place] in stable condition at [phone].  She is to return on 08/23/14 at 1000 for her next appointment.    Tressa Busman, RN  July 25, 2014

## 2014-07-26 MED ORDER — OXYCODONE-ACETAMINOPHEN 10 MG-325 MG TAB
10-325 mg | ORAL_TABLET | Freq: Four times a day (QID) | ORAL | Status: DC | PRN
Start: 2014-07-26 — End: 2014-09-12

## 2014-07-28 MED ORDER — WARFARIN 1 MG TAB
1 mg | ORAL_TABLET | ORAL | Status: DC
Start: 2014-07-28 — End: 2015-02-28

## 2014-08-17 ENCOUNTER — Ambulatory Visit: Admit: 2014-08-17 | Payer: MEDICARE | Attending: Sports Medicine | Primary: Family

## 2014-08-17 DIAGNOSIS — I10 Essential (primary) hypertension: Secondary | ICD-10-CM

## 2014-08-17 DIAGNOSIS — Z7189 Other specified counseling: Secondary | ICD-10-CM | POA: Insufficient documentation

## 2014-08-17 NOTE — Progress Notes (Signed)
1. Have you been to the ER, urgent care clinic since your last visit?  Hospitalized since your last visit?No    2. Have you seen or consulted any other health care providers outside of the [facility] since your last visit?  Include any pap smears or colon screening. No

## 2014-08-17 NOTE — Patient Instructions (Signed)
High Blood Pressure: Care Instructions  Your Care Instructions  If your blood pressure is usually above 140/90, you have high blood pressure, or hypertension. Despite what a lot of people think, high blood pressure usually doesn't cause headaches or make you feel dizzy or lightheaded. It usually has no symptoms. But it does increase your risk for heart attack, stroke, and kidney or eye damage. The higher your blood pressure, the more your risk increases.  Your doctor will give you a goal for your blood pressure. Your goal will be based on your health and your age. An example of a goal is to keep your blood pressure below 140/90.  Lifestyle changes, such as eating healthy and being active, are always important to help lower blood pressure. You might also take medicine to reach your blood pressure goal.  Follow-up care is a key part of your treatment and safety. Be sure to make and go to all appointments, and call your doctor if you are having problems. It's also a good idea to know your test results and keep a list of the medicines you take.  How can you care for yourself at home?  Medical treatment  ?? If you stop taking your medicine, your blood pressure will go back up. You may take one or more types of medicine to lower your blood pressure. Be safe with medicines. Take your medicine exactly as prescribed. Call your doctor if you think you are having a problem with your medicine.  ?? Your doctor may suggest that you take one low-dose aspirin (81 mg) a day. This can help reduce your risk of having a stroke or heart attack.  ?? See your doctor regularly. You may need to see the doctor more often at first or until your blood pressure comes down.  ?? If you are taking blood pressure medicine, talk to your doctor before you take decongestants or anti-inflammatory medicine, such as ibuprofen. Some of these medicines can raise blood pressure.  ?? Learn how to check your blood pressure at home.  Lifestyle changes   ?? Stay at a healthy weight. This is especially important if you put on weight around the waist. Losing even 10 pounds can help you lower your blood pressure.  ?? If your doctor recommends it, get more exercise. Walking is a good choice. Bit by bit, increase the amount you walk every day. Try for at least 30 minutes on most days of the week. You also may want to swim, bike, or do other activities.  ?? Avoid or limit alcohol. Talk to your doctor about whether you can drink any alcohol.  ?? Try to limit how much sodium you eat to less than 2,300 milligrams (mg) a day. Your doctor may ask you to try to eat less than 1,500 mg a day.  ?? Eat plenty of fruits (such as bananas and oranges), vegetables, legumes, whole grains, and low-fat dairy products.  ?? Lower the amount of saturated fat in your diet. Saturated fat is found in animal products such as milk, cheese, and meat. Limiting these foods may help you lose weight and also lower your risk for heart disease.  ?? Do not smoke. Smoking increases your risk for heart attack and stroke. If you need help quitting, talk to your doctor about stop-smoking programs and medicines. These can increase your chances of quitting for good.  When should you call for help?  Call your doctor now or seek immediate medical care if:  ?? Your blood   pressure is much higher than normal (such as 180/110 or higher).  ?? You think high blood pressure is causing symptoms such as:  ?? Severe headache.  ?? Blurry vision.  Watch closely for changes in your health, and be sure to contact your doctor if:  ?? You do not get better as expected.   Where can you learn more?   Go to http://www.healthwise.net/[facility]  Enter X567 in the search box to learn more about "High Blood Pressure: Care Instructions."   ?? 2006-2015 Healthwise, Incorporated. Care instructions adapted under license by [place] (which disclaims liability or warranty for this information). This care instruction is for use with your licensed  healthcare professional. If you have questions about a medical condition or this instruction, always ask your healthcare professional. Healthwise, Incorporated disclaims any warranty or liability for your use of this information.  Content Version: 10.7.482551; Current as of: October 08, 2013

## 2014-08-17 NOTE — Progress Notes (Signed)
HISTORY OF PRESENT ILLNESS    Melissa Leblanc is a 48 y.o. year old female with: Depression and HTN    HPI:    Has been doing well with current Rx and is pleased with results.  Needs refills of meds.  Also is smoking about 1/2 ppd now and has cut back to that point.    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 Karl Luke in [id number].    Current Outpatient Prescriptions   Medication Sig Dispense Refill   ??? warfarin (COUMADIN) 1 mg tablet Take 1 tab po qd at 6pm or after 30 Tab 6   ??? oxyCODONE-acetaminophen (PERCOCET 10) 10-325 mg per tablet Take 1 Tab by mouth every six (6) hours as needed for Pain. Max Daily Amount: 4 Tabs. 120 Tab 0   ??? guaiFENesin-codeine (ROBITUSSIN AC) 10-100 mg/5 mL solution Take 5 mL by mouth three (3) times daily as needed for Cough. Max Daily Amount: 15 mL. 240 mL 3   ??? traZODone (DESYREL) 50 mg tablet Take 1-2 Tabs by mouth nightly. 180 Tab 1   ??? lisinopril-hydrochlorothiazide (PRINZIDE, ZESTORETIC) 20-25 mg per tablet take 1 tablet by mouth once daily 90 Tab 1   ??? lidocaine-prilocaine (EMLA) topical cream Apply to mediport 1 hour prior to chemo 30 g 6   ??? cyclobenzaprine (FLEXERIL) 5 mg tablet Take 1 Tab by mouth three (3) times daily as needed for Muscle Spasm(s). 90 Tab 3   ??? prochlorperazine (COMPAZINE) 10 mg tablet Take 1 tab po Q 6 hours with Benadryl 25mg  for nausea 60 Tab 2   ??? diphenhydrAMINE (BENADRYL) 25 mg capsule Take 1 tab po Q 6 hours with compazine for nausea 30 Cap 2     Patient Active Problem List   Diagnosis Code   ??? 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 Prediabetes R73.09   ??? HTN (hypertension) I10   ??? Urge incontinence N39.41   ??? Supraclavicular lymphadenopathy R59.9   ??? Hodgkin's disease of head, face, and neck ([place]) C81.91   ??? Anemia D64.9   ??? Back pain M54.9   ??? Leukopenia D72.819   ??? Acute URI J06.9   ??? Bladder incontinence R32     Family History    Problem Relation Age of Onset   ??? Cancer Mother    ??? Breast Cancer Mother    ??? Cancer Father    ??? Alcohol abuse Neg Hx    ??? Arthritis-rheumatoid Neg Hx    ??? Asthma Neg Hx    ??? Bleeding Prob Neg Hx    ??? Diabetes Neg Hx    ??? Elevated Lipids Neg Hx    ??? Headache Neg Hx    ??? Heart Disease Neg Hx    ??? Hypertension Neg Hx    ??? Lung Disease Neg Hx    ??? Migraines Neg Hx    ??? Psychiatric Disorder Neg Hx    ??? Stroke Neg Hx    ??? Mental Retardation Neg Hx      ROS:  No SI, HI, mania, N, V    Objective:  BP 123/74 mmHg   Pulse 94   Temp(Src) 98.3 ??F (36.8 ??C) (Oral)   Resp 18   Ht 5\' 5"  (1.651 m)   Wt 188 lb (85.276 kg)   BMI 31.28 kg/m2   SpO2 98%  GEN:  Appears stated age in  NAD.  CARDIAC:  RRR S1S2. No Murmur, No peripheral edema.  LUNGS: CTAB w/ normal effort.  MS: No clubbing/cyanosis.        Assessment/Plan:     ICD-10-CM ICD-9-CM    1. Essential hypertension I10 401.9    2. Depression F32.9 311    3. Hodgkin's disease of head, face, and neck ([place]) C81.91 201.91        Patient verbalized understanding of plan.    Continue current as needs no Rx now and plan RTC 3 months.    Discussed smoking cessation and is working to cut down but uses cig and food as stress reliever.  Discussed advance directives and will take form and consider and likely bring back at next appt.

## 2014-08-18 ENCOUNTER — Encounter: Payer: MEDICARE | Primary: Family

## 2014-08-23 ENCOUNTER — Inpatient Hospital Stay: Payer: MEDICARE | Primary: Family

## 2014-08-31 ENCOUNTER — Inpatient Hospital Stay: Admit: 2014-08-31 | Payer: MEDICARE | Primary: Family

## 2014-08-31 DIAGNOSIS — C8590 Non-Hodgkin lymphoma, unspecified, unspecified site: Secondary | ICD-10-CM

## 2014-08-31 MED ORDER — HEPARIN LOCK FLUSH 100 UNIT/ML IV SOLN
100 unit/mL | INTRAVENOUS | Status: AC | PRN
Start: 2014-08-31 — End: 2014-09-02

## 2014-08-31 MED ORDER — HEPARIN LOCK FLUSH 100 UNIT/ML IV SOLN
100 unit/mL | INTRAVENOUS | Status: AC
Start: 2014-08-31 — End: 2014-08-31

## 2014-08-31 MED ORDER — SODIUM CHLORIDE 0.9 % IJ SYRG
INTRAMUSCULAR | Status: DC | PRN
Start: 2014-08-31 — End: 2014-09-04

## 2014-08-31 MED FILL — HEPARIN LOCK FLUSH 100 UNIT/ML IV SOLN: 100 unit/mL | INTRAVENOUS | Qty: 10

## 2014-08-31 MED FILL — BD POSIFLUSH NORMAL SALINE 0.9 % INJECTION SYRINGE: INTRAMUSCULAR | Qty: 40

## 2014-08-31 NOTE — Progress Notes (Signed)
[facility] OPIC Short Consult Note                  (Labs/Type & Cross/Portflush/Antibiotic/Non-Chemo injections)      Date: August 31, 2014    Name: Melissa Leblanc    MRN: [id number]         DOB: 03-Jun-1966    Treatment: Monthly port flush      [name] was assessed and education was provided.     [name] vitals were reviewed and patient was observed for 5 minutes prior to treatment.   Visit Vitals   Item Reading   ??? BP 139/88 mmHg   ??? Pulse 76   ??? Temp 98.6 ??F (37 ??C)   ??? Resp 18   ??? SpO2 98%   ??? Breastfeeding No     Attempted to flush right chest double lumen port, site was prepped per protocol and 20G 1" huber needle was used to flush port, however, after breaking the skin with needle patient demanded that nurse remove needle due to discomfort. She stated she did not leave her emla cream on long enough and could not tolerate.  Needle was removed and site was covered with bandaid.    [name] was discharged from [place] in stable condition at 1000. She is to return on February 10 at 1000 for her next appointment.    Tressa Busman, RN  August 31, 2014  10:54 AM

## 2014-09-12 MED ORDER — OXYCODONE-ACETAMINOPHEN 10 MG-325 MG TAB
10-325 mg | ORAL_TABLET | Freq: Four times a day (QID) | ORAL | Status: DC | PRN
Start: 2014-09-12 — End: 2014-10-11

## 2014-09-12 NOTE — Telephone Encounter (Signed)
Patient called today requesting a refill for oxyCodone-acetaminophen.

## 2014-09-20 ENCOUNTER — Encounter: Payer: MEDICARE | Primary: Family

## 2014-09-28 ENCOUNTER — Inpatient Hospital Stay: Admit: 2014-09-28 | Payer: MEDICARE | Primary: Family

## 2014-09-28 DIAGNOSIS — Z452 Encounter for adjustment and management of vascular access device: Secondary | ICD-10-CM

## 2014-09-28 MED ORDER — SODIUM CHLORIDE 0.9 % IJ SYRG
INTRAMUSCULAR | Status: DC | PRN
Start: 2014-09-28 — End: 2014-10-02
  Administered 2014-09-28: 15:00:00 via INTRAVENOUS

## 2014-09-28 MED ORDER — HEPARIN LOCK FLUSH 100 UNIT/ML IV SOLN
100 unit/mL | INTRAVENOUS | Status: DC | PRN
Start: 2014-09-28 — End: 2014-10-02
  Administered 2014-09-28: 15:00:00

## 2014-09-28 MED FILL — HEPARIN LOCK FLUSH 100 UNIT/ML IV SOLN: 100 unit/mL | INTRAVENOUS | Qty: 10

## 2014-09-28 MED FILL — BD POSIFLUSH NORMAL SALINE 0.9 % INJECTION SYRINGE: INTRAMUSCULAR | Qty: 40

## 2014-09-28 NOTE — Progress Notes (Signed)
[facility] Progress Note    Date: September 28, 2014    Name: Melissa Leblanc    MRN: [id number]         DOB: 1966/07/16    Monthly Port flush     [name] was assessed and education was provided.     [name]'s vitals were reviewed and patient was observed for 5 minutes prior to procedure.  Visit Vitals   Item Reading   ??? BP 129/85 mmHg   ??? Pulse 84   ??? Temp 98.2 ??F (36.8 ??C)   ??? Resp 18   ??? SpO2 98%   ??? Breastfeeding No       Mediports were accessed    Side:rt chest,/double    Blood return: YES to both ports    Flushed 10 ml of NS to each port, followed by Heparin 500 units to each port     Tok needles removed. Gauze/tape applied to sites.sites s s/s.    [name] tolerated the procedure, and had no complaints.    [name] was discharged from [place] in stable condition at 1015. She is to return on 10/26/14 at 1000 am for her next appointment.    Adonis Housekeeper, RN  September 28, 2014  3:33 PM

## 2014-10-11 ENCOUNTER — Inpatient Hospital Stay: Admit: 2014-10-11 | Primary: Family

## 2014-10-11 ENCOUNTER — Ambulatory Visit: Admit: 2014-10-11 | Discharge: 2014-10-11 | Payer: MEDICARE | Attending: Hematology & Oncology | Primary: Family

## 2014-10-11 DIAGNOSIS — C8191 Hodgkin lymphoma, unspecified, lymph nodes of head, face, and neck: Secondary | ICD-10-CM

## 2014-10-11 LAB — CBC WITH 3 PART DIFF
ABS. LYMPHOCYTES: 1.4 10*3/uL (ref 1.1–5.9)
ABS. MIXED CELLS: 0.4 10*3/uL (ref 0.0–2.3)
ABS. NEUTROPHILS: 1.9 10*3/uL (ref 1.8–9.5)
HCT: 39.4 % (ref 36–48)
HGB: 13.2 g/dL (ref 12.0–16.0)
LYMPHOCYTES: 39 % (ref 14–44)
MCH: 27.6 PG (ref 25.0–35.0)
MCHC: 33.5 g/dL (ref 31–37)
MCV: 82.4 FL (ref 78–102)
Mixed cells: 10 % (ref 0.1–17)
NEUTROPHILS: 51 % (ref 40–70)
PLATELET: 156 10*3/uL (ref 140–440)
RBC: 4.78 M/uL (ref 4.10–5.10)
RDW: 17.1 % — ABNORMAL HIGH (ref 11.5–14.5)
WBC: 3.7 10*3/uL — ABNORMAL LOW (ref 4.5–13.0)

## 2014-10-11 MED ORDER — OXYCODONE-ACETAMINOPHEN 10 MG-325 MG TAB
10-325 mg | ORAL_TABLET | Freq: Four times a day (QID) | ORAL | Status: DC | PRN
Start: 2014-10-11 — End: 2014-11-04

## 2014-10-11 NOTE — Progress Notes (Signed)
Hematology/Oncology  Progress Note    Name: Melissa Leblanc  Date: 10/11/2014  DOB: 14-Jun-1966    HUO:[facility] M. Sidney Ace, DO     [name] is a 49 year old female who was seen for management of her non-Hodgkin's lymphoma.    Current therapy: active surveillance, the patient previously completed 8 cycles of systemic chemotherapy with the Rituxan and CHOP regimen    Subjective:     [name] is a 49 year old African American woman has a history of non-Hodgkin's lymphoma. She has previously completed a full course of systemic chemotherapy with a combination of Rituxan and CHOP. She denies fevers, night sweats. Her appetite remains good and her weight is slowly continuing to increase. She reports having the left supraclavicular lymph node occasionally enlarging followed by regression. She requested her pain medication to be filled. There are no other complaints.    Past medical history, family history, and social history: these were reviewed and remains unchanged.    Past Medical History   Diagnosis Date   ??? Hypertension    ??? Prediabetes    ??? VAIN I (vaginal intraepithelial neoplasia grade I)    ??? Anxiety    ??? Lymphoma (HCC)    ??? Neck mass    ??? Cancer [facility])      lymph node cancer   ??? Diabetes (HCC)      border line diabetes   ??? GERD (gastroesophageal reflux disease)    ??? Asthma    ??? Bladder incontinence    ??? Depression 08/17/2014   ??? Depression 08/17/2014     Past Surgical History   Procedure Laterality Date   ??? Hx gyn       BTL   ??? Hx hysterectomy  2004   ??? Hx other surgical       lymph node biopsies neck   ??? Hx vascular access       double right chest mediport     History     Social History   ??? Marital Status: SINGLE     Spouse Name: N/A   ??? Number of Children: N/A   ??? Years of Education: N/A     Occupational History   ??? Not on file.     Social History Main Topics   ??? Smoking status: Current Every Day Smoker -- 0.25 packs/day for 25 years   ??? Smokeless tobacco: Never Used   ??? Alcohol Use: 3.0 oz/week      6 Cans of beer per week      Comment: every 3 to 4 days   ??? Drug Use: Yes     Special: Marijuana      Comment: one or 2 puffs occasionally   ??? Sexual Activity: No     Other Topics Concern   ??? Not on file     Social History Narrative     Family History   Problem Relation Age of Onset   ??? Cancer Mother    ??? Breast Cancer Mother    ??? Cancer Father    ??? Alcohol abuse Neg Hx    ??? Arthritis-rheumatoid Neg Hx    ??? Asthma Neg Hx    ??? Bleeding Prob Neg Hx    ??? Diabetes Neg Hx    ??? Elevated Lipids Neg Hx    ??? Headache Neg Hx    ??? Heart Disease Neg Hx    ??? Hypertension Neg Hx    ??? Lung Disease Neg Hx    ??? Migraines  Neg Hx    ??? Psychiatric Disorder Neg Hx    ??? Stroke Neg Hx    ??? Mental Retardation Neg Hx      Current Outpatient Prescriptions   Medication Sig Dispense Refill   ??? oxyCODONE-acetaminophen (PERCOCET 10) 10-325 mg per tablet Take 1 Tab by mouth every six (6) hours as needed for Pain. Max Daily Amount: 4 Tabs. 120 Tab 0   ??? warfarin (COUMADIN) 1 mg tablet Take 1 tab po qd at 6pm or after 30 Tab 6   ??? guaiFENesin-codeine (ROBITUSSIN AC) 10-100 mg/5 mL solution Take 5 mL by mouth three (3) times daily as needed for Cough. Max Daily Amount: 15 mL. 240 mL 3   ??? traZODone (DESYREL) 50 mg tablet Take 1-2 Tabs by mouth nightly. 180 Tab 1   ??? lisinopril-hydrochlorothiazide (PRINZIDE, ZESTORETIC) 20-25 mg per tablet take 1 tablet by mouth once daily 90 Tab 1   ??? lidocaine-prilocaine (EMLA) topical cream Apply to mediport 1 hour prior to chemo 30 g 6   ??? cyclobenzaprine (FLEXERIL) 5 mg tablet Take 1 Tab by mouth three (3) times daily as needed for Muscle Spasm(s). 90 Tab 3   ??? prochlorperazine (COMPAZINE) 10 mg tablet Take 1 tab po Q 6 hours with Benadryl 67m for nausea 60 Tab 2   ??? diphenhydrAMINE (BENADRYL) 25 mg capsule Take 1 tab po Q 6 hours with compazine for nausea 30 Cap 2       Review of Systems  Constitutional: The patient has no acute distress or discomfort.   HEENT: The patient denies recent head trauma, eye pain, blurred vision,  hearing deficit, oropharyngeal mucosal pain or lesions, and the patient denies throat pain or discomfort.  Lymphatics: The patient denies palpable peripheral lymphadenopathy.  Hematologic: The patient denies having bruising, bleeding, or progressive fatigue.  Respiratory: the patient is complaining of some cough, low-grade fever and running nose.  Cardiovascular: The patient denies having leg pain, leg swelling, heart palpitations, chest permit, chest pain, or lightheadedness.  The patient denies having dyspnea on exertion.  Gastrointestinal: The patient denies having nausea, emesis, or diarrhea. The patient denies having any hematemesis or blood in the stool.  Genitourinary: Patient denies having urinary urgency, frequency, or dysuria.  The patient denies having blood in the urine.  Psychological: The patient denies having symptoms of nervousness, anxiety, depression, or thoughts of harming herself  Or anybody .  Skin: Patient denies having skin rashes, skin, ulcerations, or unexplained itching or pruritus.  Musculoskeletal: The patient denies having pain in the joints or bones.      Objective:   BP 142/91 mmHg   Pulse 99   Temp(Src) 98.6 ??F (37 ??C)   Wt 86.818 kg (191 lb 6.4 oz)  ECOG PS=0, pain score=4/10    Physical Exam:   Gen. Appearance: The patient is in no acute distress.  Skin: There is no bruise or rash.  HEENT: The exam is unremarkable.  Neck: Supple without lymphadenopathy or thyromegaly.  Lungs: Clear to auscultation and percussion; there are no wheezes or rhonchi.  Heart: Regular rate and rhythm; there are no murmurs, gallops, or rubs.  Abdomen: Bowel sounds are present and normal.  There is no guarding, tenderness, or hepatosplenomegaly.  Extremities: There is no clubbing, cyanosis, or edema.  Neurologic: There are no focal neurologic deficits.  Lymphatics: There is no palpable peripheral lymphadenopathy. Musculoskeletal: The  patient has full range of motion at all joints.  There is no evidence of joint deformity or  effusions.  There is no focal joint tenderness.  Psychological/psychiatric: There is no clinical evidence of anxiety, depression, or melancholy.    Lab data:      Results for orders placed or performed during the hospital encounter of 10/11/14   CBC WITH 3 PART DIFF     Status: Abnormal   Result Value Ref Range Status    WBC 3.7 (L) 4.5 - 13.0 K/uL Final    RBC 4.78 4.10 - 5.10 M/uL Final    HGB 13.2 12.0 - 16.0 g/dL Final    HCT 39.4 36 - 48 % Final    MCV 82.4 78 - 102 FL Final    MCH 27.6 25.0 - 35.0 PG Final    MCHC 33.5 31 - 37 g/dL Final    RDW 17.1 (H) 11.5 - 14.5 % Final    PLATELET 156 140 - 440 K/uL Final    NEUTROPHILS 51 40 - 70 % Final    MIXED CELLS 10 0.1 - 17 % Final    LYMPHOCYTES 39 14 - 44 % Final    ABS. NEUTROPHILS 1.9 1.8 - 9.5 K/UL Final    ABS. MIXED CELLS 0.4 0.0 - 2.3 K/uL Final    ABS. LYMPHOCYTES 1.4 1.1 - 5.9 K/UL Final     Comment: Test performed at [place] [facility]. Results Reviewed by Medical Director.    DF AUTOMATED   Final           Assessment:     1. Hodgkin's disease of head, face, and neck (HCC)    2. Leukopenia, unspecified type    3. Iron deficiency anemia, unspecified iron deficiency anemia type      Plan:   Non-Hodgkin's lymphoma: On June 01, 2014 the patient had a CT scan of the chest, abdomen, and pelvis to rule out any evidence of recurrent lymphoma. The imaging studies did not reveal any evidence of lymphadenopathy or recurrent disease. The small axillary lymph nodes are also stable and there is no new lymphadenopathy. I have refill her narcotic pain medication today. Follow-up will  Continue at 4 month intervals      Leukopenia: The current CBC shows her WBC today is 3.7 and Neutrophils is 51% with absolute Neutrophils of 1.9. No  Intervention warranted at this time.     Iron deficiency anemia: The patient has continued to take the  over-the-counter iron supplement once daily. Her current hemoglobin is 13.2 g/dL with hematocrit of 39.4%. I recommended that she continue to take the iron supplement once daily.    I will have the patient in clinic for reassessment in 4 months or sooner if indicated.      Orders Placed This Encounter   ??? COMPLETE CBC & AUTO DIFF WBC   ??? InHouse CBC ([facility])     Standing Status: Future      Number of Occurrences: 1      Standing Expiration Date: 10/18/2014   ??? SED RATE (ESR)     Standing Status: Future      Number of Occurrences:       Standing Expiration Date: 10/12/2015   ??? IRON     Standing Status: Future      Number of Occurrences:       Standing Expiration Date: 10/12/2015   ??? FERRITIN     Standing Status: Future      Number of Occurrences:       Standing Expiration Date: 5/57/3220   ??? METABOLIC PANEL, COMPREHENSIVE  Standing Status: Future      Number of Occurrences:       Standing Expiration Date: 10/12/2015   ??? oxyCODONE-acetaminophen (PERCOCET 10) 10-325 mg per tablet     Sig: Take 1 Tab by mouth every six (6) hours as needed for Pain. Max Daily Amount: 4 Tabs.     Dispense:  120 Tab     Refill:  [place], MD  10/11/2014      Please note: This document has been produced using voice recognition software.  Unrecognized errors in transcription may be present.

## 2014-10-12 LAB — METABOLIC PANEL, COMPREHENSIVE
A-G Ratio: 1.3 (ref 1.1–2.5)
ALT (SGPT): 16 IU/L (ref 0–32)
AST (SGOT): 14 IU/L (ref 0–40)
Albumin: 4.1 g/dL (ref 3.5–5.5)
Alk. phosphatase: 60 IU/L (ref 39–117)
BUN/Creatinine ratio: 9 (ref 9–23)
BUN: 8 mg/dL (ref 6–24)
Bilirubin, total: 0.2 mg/dL (ref 0.0–1.2)
CO2: 26 mmol/L (ref 18–29)
Calcium: 9.2 mg/dL (ref 8.7–10.2)
Chloride: 104 mmol/L (ref 97–108)
Creatinine: 0.9 mg/dL (ref 0.57–1.00)
GFR est AA: 88 mL/min/{1.73_m2} (ref >59)
GFR est non-AA: 76 mL/min/{1.73_m2} (ref >59)
GLOBULIN, TOTAL: 3.2 g/dL (ref 1.5–4.5)
Glucose: 73 mg/dL (ref 65–99)
Potassium: 5 mmol/L (ref 3.5–5.2)
Protein, total: 7.3 g/dL (ref 6.0–8.5)
Sodium: 141 mmol/L (ref 134–144)

## 2014-10-12 LAB — AMBIG ABBREV CMP14 DEFAULT

## 2014-10-12 LAB — FERRITIN: Ferritin: 147 ng/mL (ref 15–150)

## 2014-10-12 LAB — SED RATE (ESR): Sed rate (ESR): 6 mm/hr (ref 0–32)

## 2014-10-12 LAB — IRON: Iron: 67 ug/dL (ref 27–159)

## 2014-10-21 MED ORDER — LISINOPRIL-HYDROCHLOROTHIAZIDE 20 MG-25 MG TAB
20-25 mg | ORAL_TABLET | ORAL | Status: DC
Start: 2014-10-21 — End: 2015-03-31

## 2014-10-26 ENCOUNTER — Inpatient Hospital Stay: Admit: 2014-10-26 | Payer: MEDICARE | Primary: Family

## 2014-10-26 DIAGNOSIS — Z452 Encounter for adjustment and management of vascular access device: Secondary | ICD-10-CM

## 2014-10-26 MED ORDER — SODIUM CHLORIDE 0.9 % IJ SYRG
INTRAMUSCULAR | Status: DC | PRN
Start: 2014-10-26 — End: 2014-10-30
  Administered 2014-10-26: 15:00:00 via INTRAVENOUS

## 2014-10-26 MED ORDER — HEPARIN LOCK FLUSH 100 UNIT/ML IV SOLN
100 unit/mL | INTRAVENOUS | Status: AC | PRN
Start: 2014-10-26 — End: 2014-10-27
  Administered 2014-10-26: 15:00:00 via INTRAVENOUS

## 2014-10-26 MED ORDER — HEPARIN LOCK FLUSH 100 UNIT/ML IV SOLN
100 unit/mL | INTRAVENOUS | Status: AC
Start: 2014-10-26 — End: 2014-10-26
  Administered 2014-10-26: 15:00:00 via INTRAVENOUS

## 2014-10-26 MED FILL — BD POSIFLUSH NORMAL SALINE 0.9 % INJECTION SYRINGE: INTRAMUSCULAR | Qty: 40

## 2014-10-26 MED FILL — HEPARIN LOCK FLUSH 100 UNIT/ML IV SOLN: 100 unit/mL | INTRAVENOUS | Qty: 10

## 2014-10-26 NOTE — Progress Notes (Signed)
[facility] Progress Note    Date: October 26, 2014    Name: Melissa Leblanc    MRN: [id number]         DOB: [birth year]      [name] arrived to [facility] at 1005.    [name] was assessed and education was provided.     [name]'s vitals were reviewed.  Visit Vitals   Item Reading   ??? BP 130/85 mmHg   ??? Pulse 74   ??? Temp 98.6 ??F (37 ??C)   ??? Resp 18   ??? SpO2 97%   ??? Breastfeeding No       Pt was observed for 5 minutes after obtaining vital signs prior to initiating treatment.    Each lumen of patient's upper right double lumen chest port accessed.  Blood return visualized in each lumen.  Flushed both lumens with normal saline followed by heparin per order.      De-accessed and band-aid applied to site.    [name] tolerated well without complaints.    [name] was discharged from [place] in stable condition at 1035.  She is to return on 11/23/2014 at 0930 for her next port flush appointment.    Emmit Pomfret, RN  October 26, 2014

## 2014-11-04 MED ORDER — OXYCODONE-ACETAMINOPHEN 10 MG-325 MG TAB
10-325 mg | ORAL_TABLET | Freq: Four times a day (QID) | ORAL | Status: DC | PRN
Start: 2014-11-04 — End: 2014-12-06

## 2014-11-16 ENCOUNTER — Ambulatory Visit: Admit: 2014-11-16 | Discharge: 2014-11-16 | Payer: MEDICARE | Attending: Sports Medicine | Primary: Family

## 2014-11-16 DIAGNOSIS — F325 Major depressive disorder, single episode, in full remission: Secondary | ICD-10-CM

## 2014-11-16 MED ORDER — TRAZODONE 50 MG TAB
50 mg | ORAL_TABLET | Freq: Every evening | ORAL | Status: DC
Start: 2014-11-16 — End: 2015-05-17

## 2014-11-16 NOTE — Patient Instructions (Signed)
Recovering From Depression: Care Instructions  Your Care Instructions  Taking good care of yourself is important as you recover from depression. In time, your symptoms will fade as your treatment takes hold. Do not give up. Instead, focus your energy on getting better.  Your mood will improve. It just takes some time. Focus on things that can help you feel better, such as being with friends and family, eating well, and getting enough rest. But take things slowly. Do not do too much too soon. You will begin to feel better gradually.  Follow-up care is a key part of your treatment and safety. Be sure to make and go to all appointments, and call your doctor if you are having problems. It's also a good idea to know your test results and keep a list of the medicines you take.  How can you care for yourself at home?  Be realistic  ?? If you have a large task to do, break it up into smaller steps you can handle, and just do what you can.  ?? You may want to put off important decisions until your depression has lifted. If you have plans that will have a major impact on your life, such as marriage, divorce, or a job change, try to wait a bit. Talk it over with friends and loved ones who can help you look at the overall picture first.  ?? Reaching out to people for help is important. Do not isolate yourself. Let your family and friends help you. Find someone you can trust and confide in, and talk to that person.  ?? Be patient, and be kind to yourself. Remember that depression is not your fault and is not something you can overcome with willpower alone. Treatment is necessary for depression, just like for any other illness. Feeling better takes time, and your mood will improve little by little.  Stay active  ?? Stay busy and get outside. Take a walk, or try some other light exercise.  ?? Talk with your doctor about an exercise program. Exercise can help with mild depression.   ?? Go to a movie or concert. Take part in a church activity or other social gathering. Go to a ball game.  ?? Ask a friend to have dinner with you.  Take care of yourself  ?? Eat a balanced diet with plenty of fresh fruits and vegetables, whole grains, and lean protein. If you have lost your appetite, eat small snacks rather than large meals.  ?? Avoid drinking alcohol or using illegal drugs. Do not take medicines that have not been prescribed for you. They may interfere with medicines you may be taking for depression, or they may make your depression worse.  ?? Take your medicines exactly as they are prescribed. You may start to feel better within 1 to 3 weeks of taking antidepressant medicine. But it can take as many as 6 to 8 weeks to see more improvement. If you have questions or concerns about your medicines, or if you do not notice any improvement by 3 weeks, talk to your doctor.  ?? If you have any side effects from your medicine, tell your doctor. Antidepressants can make you feel tired, dizzy, or nervous. Some people have dry mouth, constipation, headaches, sexual problems, or diarrhea. Many of these side effects are mild and will go away on their own after you have been taking the medicine for a few weeks. Some may last longer. Talk to your doctor if side effects are   bothering you too much. You might be able to try a different medicine.  ?? Get enough sleep. If you have problems sleeping:  ?? Go to bed at the same time every night, and get up at the same time every morning.  ?? Keep your bedroom dark and quiet.  ?? Do not exercise after 5:00 p.m.  ?? Avoid drinks with caffeine after 5:00 p.m.  ?? Avoid sleeping pills unless they are prescribed by the doctor treating your depression. Sleeping pills may make you groggy during the day, and they may interact with other medicine you are taking.  ?? If you have any other illnesses, such as diabetes, heart disease, or  high blood pressure, make sure to continue with your treatment. Tell your doctor about all of the medicines you take, including those with or without a prescription.  ?? Keep the numbers for these national suicide hotlines: 1-800-273-TALK (1-800-273-8255) and 1-800-SUICIDE (1-800-784-2433). If you or someone you know talks about suicide or feeling hopeless, get help right away.  When should you call for help?  Call 911 anytime you think you may need emergency care. For example, call if:  ?? You feel like hurting yourself or someone else.  ?? Someone you know has depression and is about to attempt or is attempting suicide.  Call your doctor now or seek immediate medical care if:  ?? You hear voices.  ?? Someone you know has depression and:  ?? Starts to give away his or her possessions.  ?? Uses illegal drugs or drinks alcohol heavily.  ?? Talks or writes about death, including writing suicide notes or talking about guns, knives, or pills.  ?? Starts to spend a lot of time alone.  ?? Acts very aggressively or suddenly appears calm.  Watch closely for changes in your health, and be sure to contact your doctor if:  ?? You do not get better as expected.   Where can you learn more?   Go to http://www.healthwise.net/[facility]  Enter N529 in the search box to learn more about "Recovering From Depression: Care Instructions."   ?? 2006-2015 Healthwise, Incorporated. Care instructions adapted under license by [facility] (which disclaims liability or warranty for this information). This care instruction is for use with your licensed healthcare professional. If you have questions about a medical condition or this instruction, always ask your healthcare professional. Healthwise, Incorporated disclaims any warranty or liability for your use of this information.  Content Version: 10.7.482551; Current as of: December 09, 2013

## 2014-11-16 NOTE — Progress Notes (Signed)
1. Have you been to the ER, urgent care clinic since your last visit?  Hospitalized since your last visit?No    2. Have you seen or consulted any other health care providers outside of the [facility] since your last visit?  Include any pap smears or colon screening. Yes Dr. Nadyne Coombes - oncology

## 2014-11-16 NOTE — Addendum Note (Signed)
Addended by: Selena Lesser on: 11/16/2014 10:19 AM      Modules accepted: Level of Service

## 2014-11-16 NOTE — Progress Notes (Signed)
HISTORY OF PRESENT ILLNESS    Melissa Leblanc is a 49 y.o. year old female with: Depression      HPI:  Has been doing great w/ trazodone for mood and pleased with 50mg  dose but not using every day.  Also being Tx lymphoma and doing very well.  Has been struggling with weight.    PHQ-9 SCORE: 0   <--- 0 (07/2014)  GAD-7 SCORE: 0   <---2 (07/2014)      Current Outpatient Prescriptions   Medication Sig Dispense Refill   ??? oxyCODONE-acetaminophen (PERCOCET 10) 10-325 mg per tablet Take 1 Tab by mouth every six (6) hours as needed for Pain. Max Daily Amount: 4 Tabs. 120 Tab 0   ??? lisinopril-hydrochlorothiazide (PRINZIDE, ZESTORETIC) 20-25 mg per tablet take 1 tablet by mouth once daily 90 Tab 1   ??? warfarin (COUMADIN) 1 mg tablet Take 1 tab po qd at 6pm or after 30 Tab 6   ??? guaiFENesin-codeine (ROBITUSSIN AC) 10-100 mg/5 mL solution Take 5 mL by mouth three (3) times daily as needed for Cough. Max Daily Amount: 15 mL. 240 mL 3   ??? traZODone (DESYREL) 50 mg tablet Take 1-2 Tabs by mouth nightly. 180 Tab 1   ??? lidocaine-prilocaine (EMLA) topical cream Apply to mediport 1 hour prior to chemo 30 g 6   ??? cyclobenzaprine (FLEXERIL) 5 mg tablet Take 1 Tab by mouth three (3) times daily as needed for Muscle Spasm(s). 90 Tab 3   ??? prochlorperazine (COMPAZINE) 10 mg tablet Take 1 tab po Q 6 hours with Benadryl 25mg  for nausea 60 Tab 2   ??? diphenhydrAMINE (BENADRYL) 25 mg capsule Take 1 tab po Q 6 hours with compazine for nausea 30 Cap 2     Patient Active Problem List   Diagnosis Code   ??? 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 Prediabetes R73.09   ??? HTN (hypertension) I10   ??? Urge incontinence N39.41   ??? Supraclavicular lymphadenopathy R59.0   ??? Hodgkin's disease of head, face, and neck ([place]) C81.91   ??? Anemia D64.9   ??? Back pain M54.9   ??? Leukopenia D72.819   ??? Acute URI J06.9   ??? Bladder incontinence R32   ??? Depression F32.9   ??? Advance directive discussed with patient Z71.89     Family History    Problem Relation Age of Onset   ??? Cancer Mother    ??? Breast Cancer Mother    ??? Cancer Father    ??? Alcohol abuse Neg Hx    ??? Arthritis-rheumatoid Neg Hx    ??? Asthma Neg Hx    ??? Bleeding Prob Neg Hx    ??? Diabetes Neg Hx    ??? Elevated Lipids Neg Hx    ??? Headache Neg Hx    ??? Heart Disease Neg Hx    ??? Hypertension Neg Hx    ??? Lung Disease Neg Hx    ??? Migraines Neg Hx    ??? Psychiatric Disorder Neg Hx    ??? Stroke Neg Hx    ??? Mental Retardation Neg Hx        ROS:  No SI, HI, mania, weight loss    Objective:  BP 130/86 mmHg   Pulse 78   Temp(Src) 98 ??F (36.7 ??C) (Oral)   Resp 16   Ht 5\' 5"  (1.651 m)   Wt 194 lb (87.998 kg)   BMI 32.28 kg/m2   SpO2 98%  GEN:  Appears  stated age in NAD.  CARDIAC:  RRR S1S2. No Murmur, No peripheral edema.  LUNGS: CTAB w/ normal effort.  MS: No clubbing/cyanosis.      Assessment/Plan:     ICD-10-CM ICD-9-CM    1. Major depressive disorder with single episode, in full remission ([place]) F32.5 296.26 traZODone (DESYREL) 50 mg tablet       Patient verbalized understanding of plan.    Discussed weight loss and will refill trazodone and RTC 6 months.    Advance directive discussed in detail and will bring back packet for signatures.  Deciding between sister and oldest son as POA.

## 2014-11-23 ENCOUNTER — Encounter: Payer: MEDICARE | Primary: Family

## 2014-11-25 ENCOUNTER — Encounter: Payer: MEDICARE | Primary: Family

## 2014-11-28 ENCOUNTER — Emergency Department: Admit: 2014-11-28 | Payer: MEDICARE | Primary: Family

## 2014-11-28 ENCOUNTER — Inpatient Hospital Stay
Admit: 2014-11-28 | Discharge: 2014-11-28 | Disposition: A | Discharge status: Home / Self Care | Payer: MEDICARE | Attending: Emergency Medicine

## 2014-11-28 ENCOUNTER — Inpatient Hospital Stay: Payer: MEDICARE | Primary: Family

## 2014-11-28 DIAGNOSIS — B349 Viral infection, unspecified: Secondary | ICD-10-CM

## 2014-11-28 LAB — URINALYSIS W/ RFLX MICROSCOPIC
Bilirubin: NEGATIVE
Blood: NEGATIVE
Glucose: NEGATIVE mg/dL
Ketone: NEGATIVE mg/dL
Leukocyte Esterase: NEGATIVE
Nitrites: NEGATIVE
Protein: NEGATIVE mg/dL
Specific gravity: <1.005 — ABNORMAL LOW (ref 1.005–1.030)
Urobilinogen: 0.2 EU/dL (ref 0.2–1.0)
pH (UA): 7.5 (ref 5.0–8.0)

## 2014-11-28 LAB — EKG, 12 LEAD, INITIAL
Atrial Rate: 79 {beats}/min
Calculated P Axis: 15 degrees
Calculated R Axis: 29 degrees
Calculated T Axis: 16 degrees
Diagnosis: NORMAL
P-R Interval: 142 ms
Q-T Interval: 372 ms
QRS Duration: 80 ms
QTC Calculation (Bezet): 426 ms
Ventricular Rate: 79 {beats}/min

## 2014-11-28 LAB — CBC WITH AUTOMATED DIFF
ABS. BASOPHILS: 0 10*3/uL (ref 0.0–0.06)
ABS. EOSINOPHILS: 0.1 10*3/uL (ref 0.0–0.4)
ABS. LYMPHOCYTES: 2 10*3/uL (ref 0.9–3.6)
ABS. MONOCYTES: 0.6 10*3/uL (ref 0.05–1.2)
ABS. NEUTROPHILS: 2 10*3/uL (ref 1.8–8.0)
BASOPHILS: 0 % (ref 0–2)
EOSINOPHILS: 2 % (ref 0–5)
HCT: 41.1 % (ref 35.0–45.0)
HGB: 14.2 g/dL (ref 12.0–16.0)
LYMPHOCYTES: 43 % (ref 21–52)
MCH: 27.8 PG (ref 24.0–34.0)
MCHC: 34.5 g/dL (ref 31.0–37.0)
MCV: 80.4 FL (ref 74.0–97.0)
MONOCYTES: 13 % — ABNORMAL HIGH (ref 3–10)
MPV: 10.4 FL (ref 9.2–11.8)
NEUTROPHILS: 42 % (ref 40–73)
PLATELET: 228 10*3/uL (ref 135–420)
RBC: 5.11 M/uL (ref 4.20–5.30)
RDW: 15.8 % — ABNORMAL HIGH (ref 11.6–14.5)
WBC: 4.7 10*3/uL (ref 4.6–13.2)

## 2014-11-28 LAB — CARDIAC PANEL,(CK, CKMB & TROPONIN)
CK - MB: 0.8 ng/ml (ref 0.5–3.6)
CK-MB Index: 1 % (ref 0.0–4.0)
CK: 83 U/L (ref 26–192)
Troponin-I, QT: <0.02 NG/ML (ref 0.0–0.045)

## 2014-11-28 LAB — METABOLIC PANEL, BASIC
Anion gap: 6 mmol/L (ref 3.0–18)
BUN/Creatinine ratio: 11 — ABNORMAL LOW (ref 12–20)
BUN: 11 MG/DL (ref 7.0–18)
CO2: 27 mmol/L (ref 21–32)
Calcium: 9.5 MG/DL (ref 8.5–10.1)
Chloride: 101 mmol/L (ref 100–108)
Creatinine: 0.96 MG/DL (ref 0.6–1.3)
GFR est AA: >60 mL/min/{1.73_m2} (ref >60)
GFR est non-AA: >60 mL/min/{1.73_m2} (ref >60)
Glucose: 80 mg/dL (ref 74–99)
Potassium: 4.2 mmol/L (ref 3.5–5.5)
Sodium: 134 mmol/L — ABNORMAL LOW (ref 136–145)

## 2014-11-28 LAB — POC LACTIC ACID: Lactic Acid (POC): 1.2 mmol/L (ref 0.4–2.0)

## 2014-11-28 MED ORDER — KETOROLAC TROMETHAMINE 30 MG/ML INJECTION
30 mg/mL (1 mL) | INTRAMUSCULAR | Status: AC
Start: 2014-11-28 — End: 2014-11-28
  Administered 2014-11-28: 14:00:00 via INTRAVENOUS

## 2014-11-28 MED ORDER — PROCHLORPERAZINE EDISYLATE 5 MG/ML INJECTION
5 mg/mL | Freq: Four times a day (QID) | INTRAMUSCULAR | Status: DC | PRN
Start: 2014-11-28 — End: 2014-11-28

## 2014-11-28 MED ORDER — PROCHLORPERAZINE EDISYLATE 5 MG/ML INJECTION
5 mg/mL | INTRAMUSCULAR | Status: AC
Start: 2014-11-28 — End: 2014-11-28
  Administered 2014-11-28: 15:00:00 via INTRAVENOUS

## 2014-11-28 MED ORDER — SODIUM CHLORIDE 0.9 % IJ SYRG
INTRAMUSCULAR | Status: DC | PRN
Start: 2014-11-28 — End: 2014-12-02

## 2014-11-28 MED ORDER — DIPHENHYDRAMINE HCL 50 MG/ML IJ SOLN
50 mg/mL | Freq: Once | INTRAMUSCULAR | Status: AC
Start: 2014-11-28 — End: 2014-11-28
  Administered 2014-11-28: 14:00:00 via INTRAVENOUS

## 2014-11-28 MED ORDER — HEPARIN LOCK FLUSH 100 UNIT/ML IV SOLN
100 unit/mL | INTRAVENOUS | Status: AC | PRN
Start: 2014-11-28 — End: 2014-11-29

## 2014-11-28 MED FILL — KETOROLAC TROMETHAMINE 30 MG/ML INJECTION: 30 mg/mL (1 mL) | INTRAMUSCULAR | Qty: 1

## 2014-11-28 MED FILL — DIPHENHYDRAMINE HCL 50 MG/ML IJ SOLN: 50 mg/mL | INTRAMUSCULAR | Qty: 1

## 2014-11-28 MED FILL — BD POSIFLUSH NORMAL SALINE 0.9 % INJECTION SYRINGE: INTRAMUSCULAR | Qty: 40

## 2014-11-28 MED FILL — PROCHLORPERAZINE EDISYLATE 5 MG/ML INJECTION: 5 mg/mL | INTRAMUSCULAR | Qty: 2

## 2014-11-28 NOTE — ED Notes (Signed)
Pt returned from CT

## 2014-11-28 NOTE — ED Notes (Signed)
Pt hourly rounding competed.  Safety   Pt (x) resting on stretcher with side rails up and call bell in reach.      Toileting   Pt offered toileting. Denies need at this time. Resting comfortably. Awaiting results.   Ongoing Updates  Updated on plan of care and status of test results.  Pain Management  Inquired as to comfort and offered comfort measures:   Sleeping on stretcher. Arouses to voice and touch. States pain at 4/10 at this time. Call ball within reach. VSS

## 2014-11-28 NOTE — ED Notes (Signed)
Pt hourly rounding competed.  Safety   Pt (x) resting on stretcher with side rails up and call bell in reach.    () in chair    () in parents arms.  Toileting   Denies need for BR assistance at this time.  Ongoing Updates  Updated on plan of care and status of test results.  Pain Management  Inquired as to comfort and offered comfort measures:   Denies pain at this time.

## 2014-11-28 NOTE — ED Notes (Signed)
Pt states "i feel ike im going to [place] [facility]  "

## 2014-11-28 NOTE — ED Notes (Addendum)
Toradol and Benadryl given through left antecubital PIV. Pt c/o burning. Nacl flush with medications and pt continues to c/o burning. Medication held. Able to administer 12.5 benadryl, Toradol 15mg . Flushed site with 10 mL NaCl. Pt states no longer burning. MD aware.

## 2014-11-28 NOTE — ED Notes (Addendum)
brought from infusion center for light headedness, dizzyness and nausea, acute onset headache and nausea. Had not started infusion yet

## 2014-11-28 NOTE — Progress Notes (Signed)
Melissa Leblanc arrived tearful complaining of severe head pain.  She went to the ER for treatment and her port flush will be rescheduled.

## 2014-11-28 NOTE — ED Notes (Signed)
Pt complaining of left sided temporal headache, dizziness, nausea that started this morning prior to having port flushed at the infusion center. She states she has had episodes like this before but this episode seems worse. Denies loc, change in vision, blurry vision. She is A&O x 4 and answers questions appropriately. Cardiac monitor in place. Call bell within reach. VSS

## 2014-11-28 NOTE — ED Provider Notes (Addendum)
HPI Comments: Melissa Leblanc is a 49 y.o. female with a history of Lymphoma, HTN, Diabetes who presents to ED c/o intermittent HA that have been getting progressively worse and lasting longer. Patient states that the HA feels like "cold liquid in her skull." Patient is also c/o sharp left and right side pain, hot flashes, chills, appetite change, nausea. Patient denies urinary problems, chest pain. Patient states that she has a Mediport that has to be flushed once a month. Patient states that she is a caffeine user. Patient reports taking Percocet and Warfarin. NKDA. No other associated symptoms or modifying factors at this time.        Patient is a 49 y.o. female presenting with dizziness, headaches, and nausea. The history is provided by the patient.   Dizziness   Associated symptoms include nausea and headaches. Pertinent negatives include no chest pain, no palpitations, no confusion, no diaphoresis, no fever, no abdominal pain, no vomiting, no congestion, no light-headedness and no seizures. Her past medical history does not include no syncope.   Headache  Associated symptoms include headaches. Pertinent negatives include no chest pain, no abdominal pain and no shortness of breath.   Nausea   Associated symptoms include chills, headaches and headaches. Pertinent negatives include no fever, no abdominal pain, no diarrhea, no arthralgias, no myalgias and no cough.        Past Medical History:   Diagnosis Date   ??? Hypertension    ??? Prediabetes    ??? VAIN I (vaginal intraepithelial neoplasia grade I)    ??? Anxiety    ??? Lymphoma ([place])    ??? Neck mass    ??? Cancer [facility])      lymph node cancer   ??? Diabetes ([place])      border line diabetes   ??? GERD (gastroesophageal reflux disease)    ??? Asthma    ??? Bladder incontinence    ??? Depression 08/17/2014   ??? Depression 08/17/2014       Past Surgical History:   Procedure Laterality Date   ??? Hx gyn       BTL   ??? Hx hysterectomy  2004   ??? Hx other surgical        lymph node biopsies neck   ??? Hx vascular access       double right chest mediport         Family History:   Problem Relation Age of Onset   ??? Cancer Mother    ??? Breast Cancer Mother    ??? Cancer Father    ??? Alcohol abuse Neg Hx    ??? Arthritis-rheumatoid Neg Hx    ??? Asthma Neg Hx    ??? Bleeding Prob Neg Hx    ??? Diabetes Neg Hx    ??? Elevated Lipids Neg Hx    ??? Headache Neg Hx    ??? Heart Disease Neg Hx    ??? Hypertension Neg Hx    ??? Lung Disease Neg Hx    ??? Migraines Neg Hx    ??? Psychiatric Disorder Neg Hx    ??? Stroke Neg Hx    ??? Mental Retardation Neg Hx        History     Social History   ??? Marital Status: SINGLE     Spouse Name: N/A   ??? Number of Children: N/A   ??? Years of Education: N/A     Occupational History   ??? Not on file.     Social  History Main Topics   ??? Smoking status: Current Every Day Smoker -- 0.25 packs/day for 25 years   ??? Smokeless tobacco: Never Used   ??? Alcohol Use: 3.0 oz/week     6 Cans of beer per week      Comment: every 3 to 4 days   ??? Drug Use: Yes     Special: Marijuana      Comment: one or 2 puffs occasionally   ??? Sexual Activity: No     Other Topics Concern   ??? Not on file     Social History Narrative           ALLERGIES: Review of patient's allergies indicates no known allergies.      Review of Systems   Constitutional: Positive for chills and appetite change. Negative for fever and diaphoresis.   HENT: Negative for congestion, sore throat and trouble swallowing.    Eyes: Negative.  Negative for photophobia, pain and redness.   Respiratory: Negative.  Negative for cough, chest tightness, shortness of breath and wheezing.    Cardiovascular: Negative.  Negative for chest pain and palpitations.   Gastrointestinal: Positive for nausea. Negative for vomiting, abdominal pain, diarrhea and blood in stool.   Genitourinary: Negative for dysuria, frequency and difficulty urinating.   Musculoskeletal: Negative.  Negative for myalgias, arthralgias, neck pain and neck stiffness.   Skin: Negative.     Neurological: Positive for headaches. Negative for tremors, seizures, syncope, speech difficulty and light-headedness.   Psychiatric/Behavioral: Negative.  Negative for confusion. The patient is not nervous/anxious.    All other systems reviewed and are negative.      Filed Vitals:    11/28/14 0916 11/28/14 0934 11/28/14 0941   BP: 144/94 135/92    Pulse: 93 84    Temp: 98.5 ??F (36.9 ??C)     Resp: 16 10    Weight: 83.915 kg (185 lb)     SpO2: 99% 100% 99%            Physical Exam   Constitutional: She is oriented to person, place, and time. She appears well-developed and well-nourished.  Non-toxic appearance. She does not have a sickly appearance. She does not appear ill. No distress.   HENT:   Head: Normocephalic and atraumatic.   Mouth/Throat: Oropharynx is clear and moist. No oropharyngeal exudate.   Eyes: Conjunctivae and EOM are normal. Pupils are equal, round, and reactive to light. No scleral icterus.   Neck: Trachea normal and normal range of motion. Neck supple. No hepatojugular reflux and no JVD present. No tracheal deviation present. No thyromegaly present.   Cardiovascular: Normal rate, regular rhythm, S1 normal, S2 normal, normal heart sounds, intact distal pulses and normal pulses.  Exam reveals no gallop, no S3 and no S4.    No murmur heard.  Pulses:       Radial pulses are 2+ on the right side, and 2+ on the left side.        Dorsalis pedis pulses are 2+ on the right side, and 2+ on the left side.   Pulmonary/Chest: Effort normal and breath sounds normal. No respiratory distress. She has no decreased breath sounds. She has no wheezes. She has no rhonchi. She has no rales.   Abdominal: Soft. Normal appearance and bowel sounds are normal. She exhibits no distension and no mass. There is no hepatosplenomegaly. There is no tenderness. There is no rigidity, no rebound, no guarding, no CVA tenderness, no tenderness at McBurney's point and negative  Murphy's sign.    Musculoskeletal: Normal range of motion. She exhibits no edema or tenderness.   Lymphadenopathy:        Head (right side): No submental, no submandibular, no preauricular and no occipital adenopathy present.        Head (left side): No submental, no submandibular, no preauricular and no occipital adenopathy present.     She has no cervical adenopathy.        Right: No supraclavicular adenopathy present.        Left: No supraclavicular adenopathy present.   Neurological: She is alert and oriented to person, place, and time. She has normal strength and normal reflexes. She is not disoriented. No cranial nerve deficit or sensory deficit. Coordination and gait normal. GCS eye subscore is 4. GCS verbal subscore is 5. GCS motor subscore is 6.   Grossly intact    Skin: Skin is warm, dry and intact. No rash noted. She is not diaphoretic.   Psychiatric: She has a normal mood and affect. Her speech is normal and behavior is normal. Judgment and thought content normal. Cognition and memory are normal.   Nursing note and vitals reviewed.       MDM  Number of Diagnoses or Management Options  Viral syndrome:   Diagnosis management comments: Headache  Neoplasm   Hx of lymphoma   ACS  Viral syndrome        Procedures     Labs essentially normal.  Chest X-Ray showed No acute process. EKG showed NSR with rate of 79 bpm. With no ST elevations or depression and non specific T wave changes. CT head showed no acute process.    9:52 AM 11/28/2014     I have reassessed the patient.  Patient is pain free and feeling much better.   Patient will not be prescribed medications.  Patient was discharged in stable condition.  Patient is to return to emergency department if any new or worsening condition.        Scribe Joaquin scribing for and in the presence of Atilano Median DO 9:52 AM, 11/28/2014.    Physician Attestation  I personally performed the services described in the documentation,  reviewed and edited the documentation which was dictated to the scribe in my presence, and it accurately records my words and actions.  Nadalee Neiswender DO   9:52 AM, 11/28/2014

## 2014-11-28 NOTE — ED Notes (Signed)
Pt to CT via stretcher and transport tech.

## 2014-11-28 NOTE — ED Notes (Signed)
Discharge instructions given to patient. Verbalizes understanding.

## 2014-12-06 MED ORDER — OXYCODONE-ACETAMINOPHEN 10 MG-325 MG TAB
10-325 mg | ORAL_TABLET | Freq: Four times a day (QID) | ORAL | Status: DC | PRN
Start: 2014-12-06 — End: 2015-01-03

## 2014-12-06 NOTE — Telephone Encounter (Signed)
Pt requesting Rx for Percocet  Phone @ [phone]

## 2014-12-12 ENCOUNTER — Ambulatory Visit: Admit: 2014-12-12 | Payer: MEDICARE | Attending: Sports Medicine | Primary: Family

## 2014-12-12 DIAGNOSIS — J301 Allergic rhinitis due to pollen: Secondary | ICD-10-CM

## 2014-12-12 MED ORDER — CYCLOBENZAPRINE 5 MG TAB
5 mg | ORAL_TABLET | Freq: Three times a day (TID) | ORAL | Status: DC | PRN
Start: 2014-12-12 — End: 2016-02-21

## 2014-12-12 MED ORDER — FLUTICASONE 50 MCG/ACTUATION NASAL SPRAY, SUSP
50 mcg/actuation | Freq: Every day | NASAL | Status: DC
Start: 2014-12-12 — End: 2016-03-11

## 2014-12-12 NOTE — Patient Instructions (Signed)
Use sinus rinse, nasal saline rinse, or Neti pot with distilled water for congestion.    Nasal saline irrigation recipe:  3 tsp sea salt (without iodine), 1 tsp baking soda, 8 oz distilled water.    Over the counter decongestant (like Sudafed) as needed.  May use Nasal decongestant spray (like Afrin) as needed for max of three days.    Use Flonase or Nasacort OTC nasal steroid spray.  2 sprays each nostril daily and do not inhale it.    Rest often and drink plenty of fluids.    Take tylenol or ibuprofen for fever/aches.    Seek care if concerned.    Seasonal Allergies: Care Instructions  Your Care Instructions  Allergies occur when your body's defense system (immune system) overreacts to certain substances. The immune system treats a harmless substance as if it were a harmful germ or virus. Many things can cause this to happen. Examples include pollens, medicine, food, dust, animal dander, and mold.  Your allergies are seasonal if you have symptoms just at certain times of the year. In that case, you are probably allergic to pollens from certain trees, grasses, or weeds.  Allergies can be mild or severe. Over-the-counter allergy medicine may help with some symptoms. Read and follow all instructions on the label.  Managing your allergies is an important part of staying healthy. Your doctor may suggest that you have tests to help find the cause of your allergies. When you know what things trigger your symptoms, you can avoid them. This can prevent allergy symptoms and other health problems.  In some cases, immunotherapy might help. For this treatment, you get shots or use pills that have a small amount of certain allergens in them. Your body "gets used to" the allergen, so you react less to it over time. This kind of treatment may help prevent or reduce some allergy symptoms.  Follow-up care is a key part of your treatment and safety. Be sure to make and go to all appointments, and call your doctor if you are having  problems. It's also a good idea to know your test results and keep a list of the medicines you take.  How can you care for yourself at home?  ?? Be safe with medicines. Take your medicines exactly as prescribed. Call your doctor if you think you are having a problem with your medicine.  ?? During your allergy season, keep windows closed. If you need to use air-conditioning, change or clean all filters every month. Take a shower and change your clothes after you have been outside.  ?? Stay inside when pollen counts are high. Vacuum once or twice a week. Use a vacuum cleaner with a HEPA filter or a double-thickness filter.  When should you call for help?  Call 911 anytime you think you may need emergency care. For example, call if:  ?? You have symptoms of a severe allergic reaction. These may include:  ?? Sudden raised, red areas (hives) all over your body.  ?? Swelling of the throat, mouth, lips, or tongue.  ?? Trouble breathing.  ?? Passing out (losing consciousness). Or you may feel very lightheaded or suddenly feel weak, confused, or restless.  Watch closely for changes in your health, and be sure to contact your doctor if:  ?? You need help controlling your allergies.  ?? You have questions about allergy testing.  ?? You do not get better as expected.   Where can you learn more?   Go to  [email or web address]  Enter [id number] in the search box to learn more about "Seasonal Allergies: Care Instructions."   ?? 2006-2016 [employer] Inc[employer]. Care instructions adapted under license by [employer] (which disclaims liability or warranty for this information). This care instruction is for use with your licensed healthcare professional. If you have questions about a medical condition or this instruction, always ask your healthcare professional. [place] any warranty or liability for your use of this information.  Content Version: 10.8.513193; Current as of: October 08, 2013

## 2014-12-12 NOTE — Progress Notes (Signed)
HISTORY OF PRESENT ILLNESS    Melissa Leblanc is a 49 y.o. year old female comes in today to be evaluated and treated from, emergency room for headaches and muscle spasms    With recent weather change 2-3 weeks ago sent to ER as she was to have mediport flushed and had red eyes and a lot of pressure frontal face L>R.  Was Tx with something in ER on 4/11/6.      Also gettign spasms inflanks as well as feet w/ certain activity.  Was using flexeril prior.    Current Outpatient Prescriptions   Medication Sig Dispense Refill   ??? oxyCODONE-acetaminophen (PERCOCET 10) 10-325 mg per tablet Take 1 Tab by mouth every six (6) hours as needed for Pain. Max Daily Amount: 4 Tabs. 120 Tab 0   ??? traZODone (DESYREL) 50 mg tablet Take 1 Tab by mouth nightly. 90 Tab 1   ??? lisinopril-hydrochlorothiazide (PRINZIDE, ZESTORETIC) 20-25 mg per tablet take 1 tablet by mouth once daily 90 Tab 1   ??? warfarin (COUMADIN) 1 mg tablet Take 1 tab po qd at 6pm or after 30 Tab 6   ??? guaiFENesin-codeine (ROBITUSSIN AC) 10-100 mg/5 mL solution Take 5 mL by mouth three (3) times daily as needed for Cough. Max Daily Amount: 15 mL. 240 mL 3   ??? lidocaine-prilocaine (EMLA) topical cream Apply to mediport 1 hour prior to chemo 30 g 6   ??? cyclobenzaprine (FLEXERIL) 5 mg tablet Take 1 Tab by mouth three (3) times daily as needed for Muscle Spasm(s). 90 Tab 3   ??? prochlorperazine (COMPAZINE) 10 mg tablet Take 1 tab po Q 6 hours with Benadryl 25mg  for nausea 60 Tab 2   ??? diphenhydrAMINE (BENADRYL) 25 mg capsule Take 1 tab po Q 6 hours with compazine for nausea 30 Cap 2     Past Medical History   Diagnosis Date   ??? Hypertension    ??? Prediabetes    ??? VAIN I (vaginal intraepithelial neoplasia grade I)    ??? Anxiety    ??? Lymphoma ([place])    ??? Neck mass    ??? Cancer [facility])      lymph node cancer   ??? Diabetes ([place])      border line diabetes   ??? GERD (gastroesophageal reflux disease)    ??? Asthma    ??? Bladder incontinence    ??? Depression 08/17/2014   ??? Depression 08/17/2014        ROS:  No swell, cough, SOB, F    Objective:  BP 100/78 mmHg   Pulse 90   Temp(Src) 98 ??F (36.7 ??C) (Oral)   Resp 16   Ht 5\' 5"  (1.651 m)   Wt 194 lb (87.998 kg)   BMI 32.28 kg/m2   SpO2 98%    GEN:  Appears stated age in NAD.  HEENT: Conjunctiva/lids normal.  External canals/nares normal.  TM retracted L>R.   Nose: mucosa swollen, pale, and boggy  Tongue midline.  Throat cobblestoning present. Exudates not present.  tonsils are present and normal.  NECK: Trachea midline.  No masses.  no regional adenopathy  CARDIAC:  regular rate and rhythm. no Murmur, no peripheral edema.  LUNGS: lungs clear to auscultation, no accessory muscle use.  MS: no clubbing/cyanosis.  SKIN: Warm/dry without rash.        Assessment/Plan:     ICD-10-CM ICD-9-CM    1. Allergic rhinitis due to pollen J30.1 477.0 fluticasone (FLONASE) 50 mcg/actuation nasal spray   2. Muscle  spasm M62.838 728.85 cyclobenzaprine (FLEXERIL) 5 mg tablet       Patient verbalized understanding of plan.      Will refill flexeril and stretch demo Rx flonase and she will RTC 1-2 weeks for new Dx above.

## 2014-12-12 NOTE — Addendum Note (Signed)
Addended by: Zackery Barefoot on: 12/12/2014 11:55 AM      Modules accepted: Orders

## 2014-12-12 NOTE — Progress Notes (Signed)
1. Have you been to the ER, urgent care clinic since your last visit?  Hospitalized since your last visit?Yes emergency room 11/28/14 - headache and muscle spasms    2. Have you seen or consulted any other health care providers outside of the [facility] since your last visit?  Include any pap smears or colon screening. Yes Dr. Nadyne Coombes - oncology

## 2014-12-26 ENCOUNTER — Inpatient Hospital Stay: Admit: 2014-12-26 | Payer: MEDICARE | Primary: Family

## 2014-12-26 DIAGNOSIS — Z452 Encounter for adjustment and management of vascular access device: Secondary | ICD-10-CM

## 2014-12-26 MED ORDER — HEPARIN LOCK FLUSH 100 UNIT/ML IV SOLN
100 unit/mL | INTRAVENOUS | Status: AC
Start: 2014-12-26 — End: 2014-12-26
  Administered 2014-12-26: 13:00:00 via INTRAVENOUS

## 2014-12-26 MED ORDER — HEPARIN LOCK FLUSH 100 UNIT/ML IV SOLN
100 unit/mL | INTRAVENOUS | Status: AC | PRN
Start: 2014-12-26 — End: 2014-12-27

## 2014-12-26 MED ORDER — SODIUM CHLORIDE 0.9 % IJ SYRG
INTRAMUSCULAR | Status: DC | PRN
Start: 2014-12-26 — End: 2014-12-30
  Administered 2014-12-26: 13:00:00 via INTRAVENOUS

## 2014-12-26 MED FILL — NORMAL SALINE FLUSH 0.9 % INJECTION SYRINGE: INTRAMUSCULAR | Qty: 80

## 2014-12-26 MED FILL — HEPARIN LOCK FLUSH 100 UNIT/ML IV SOLN: 100 unit/mL | INTRAVENOUS | Qty: 10

## 2014-12-26 NOTE — Progress Notes (Signed)
[facility] Progress Note    Date: Dec 26, 2014    Name: Melissa Leblanc    MRN: [id number]         DOB: [birth year]      [name] arrived in the [facility] today, at 0800, in stable condition, here for her Q 4 week  port flush. She was assessed and education was provided.     [name]'s vitals were reviewed.  Visit Vitals   Item Reading   ??? BP 123/79 mmHg   ??? Pulse 72   ??? Temp 98.1 ??F (36.7 ??C)   ??? Resp 16   ??? Ht 5\' 5"  (1.651 m)   ??? Wt 88.905 kg (196 lb)   ??? BMI 32.62 kg/m2   ??? Breastfeeding No         Her right chest double lumen port was accessed without incident, and both lumens were flushed per protocol, and without incident.  Then, both huber needles were removed and gauze/bandaids X 2, were applied.          [name] tolerated well, and had no complaints.    [name] was discharged from [facility] in stable condition at [place]. She is to return in 4 weeks, on Monday, 01-23-15,  at 0830,  for her next appointment, to have her port flushed.     Aida Raider, RN  Dec 26, 2014  8:19 AM

## 2015-01-03 MED ORDER — OXYCODONE-ACETAMINOPHEN 10 MG-325 MG TAB
10-325 mg | ORAL_TABLET | Freq: Four times a day (QID) | ORAL | Status: DC | PRN
Start: 2015-01-03 — End: 2015-02-01

## 2015-01-03 NOTE — Telephone Encounter (Signed)
Pt states she may have enough to take care of one more day.

## 2015-01-23 DIAGNOSIS — Z452 Encounter for adjustment and management of vascular access device: Secondary | ICD-10-CM

## 2015-01-23 MED ORDER — SODIUM CHLORIDE 0.9 % IJ SYRG
Freq: Three times a day (TID) | INTRAMUSCULAR | Status: DC
Start: 2015-01-23 — End: 2015-01-23

## 2015-01-23 MED ORDER — HEPARIN LOCK FLUSH 100 UNIT/ML IV SOLN
100 unit/mL | INTRAVENOUS | Status: DC | PRN
Start: 2015-01-23 — End: 2015-02-23

## 2015-01-23 MED ORDER — SODIUM CHLORIDE 0.9 % IJ SYRG
Freq: Three times a day (TID) | INTRAMUSCULAR | Status: DC
Start: 2015-01-23 — End: 2015-02-23

## 2015-01-23 MED FILL — BD POSIFLUSH NORMAL SALINE 0.9 % INJECTION SYRINGE: INTRAMUSCULAR | Qty: 40

## 2015-01-24 ENCOUNTER — Inpatient Hospital Stay: Payer: MEDICARE | Primary: Family

## 2015-01-27 ENCOUNTER — Inpatient Hospital Stay: Admit: 2015-01-27 | Payer: MEDICARE | Primary: Family

## 2015-01-27 MED ORDER — HEPARIN LOCK FLUSH 100 UNIT/ML IV SOLN
100 unit/mL | INTRAVENOUS | Status: AC
Start: 2015-01-27 — End: 2015-01-27
  Administered 2015-01-27: 14:00:00 via INTRAVENOUS

## 2015-01-27 MED ORDER — HEPARIN LOCK FLUSH 100 UNIT/ML IV SOLN
100 unit/mL | INTRAVENOUS | Status: AC | PRN
Start: 2015-01-27 — End: 2015-01-28
  Administered 2015-01-27: 14:00:00 via INTRAVENOUS

## 2015-01-27 MED ORDER — SODIUM CHLORIDE 0.9 % IJ SYRG
INTRAMUSCULAR | Status: DC | PRN
Start: 2015-01-27 — End: 2015-01-31
  Administered 2015-01-27 (×2): via INTRAVENOUS

## 2015-01-27 MED FILL — HEPARIN LOCK FLUSH 100 UNIT/ML IV SOLN: 100 unit/mL | INTRAVENOUS | Qty: 10

## 2015-01-27 MED FILL — BD POSIFLUSH NORMAL SALINE 0.9 % INJECTION SYRINGE: INTRAMUSCULAR | Qty: 40

## 2015-01-27 NOTE — Progress Notes (Signed)
[facility] Progress Note    Date: January 27, 2015    Name: Melissa Leblanc    MRN: [id number]         DOB: Jan 10, 1966      [name] arrived to [facility] at [phone].    [name] was assessed and education was provided.     [name]'s vitals were reviewed.  Visit Vitals   Item Reading   ??? BP 136/84 mmHg   ??? Pulse 74   ??? Temp 98.4 ??F (36.9 ??C)   ??? Resp 18   ??? Ht 5\' 5"  (1.651 m)   ??? Wt 93.033 kg (205 lb 1.6 oz)   ??? BMI 34.13 kg/m2   ??? SpO2 98%   ??? Breastfeeding No       Pt was observed for 5 minutes after obtaining vital signs prior to initiating treatment.    Each lumen of patient's upper right double lumen chest port accessed.  Blood return visualized in each lumen.  Flushed both lumens with normal saline followed by heparin per order.      De-accessed and band-aid applied to site.    [name] tolerated well without complaints.    [name] was discharged from [facility] in stable condition at 0955.  She is to return on 02/24/15 at 0830 for her next port flush appointment.    Emmit Pomfret, RN  January 27, 2015

## 2015-02-01 MED ORDER — OXYCODONE-ACETAMINOPHEN 10 MG-325 MG TAB
10-325 mg | ORAL_TABLET | Freq: Four times a day (QID) | ORAL | Status: DC | PRN
Start: 2015-02-01 — End: 2015-02-28

## 2015-02-07 ENCOUNTER — Ambulatory Visit: Admit: 2015-02-07 | Discharge: 2015-02-07 | Payer: MEDICARE | Attending: Hematology & Oncology | Primary: Family

## 2015-02-07 ENCOUNTER — Inpatient Hospital Stay: Admit: 2015-02-07 | Primary: Family

## 2015-02-07 DIAGNOSIS — D72819 Decreased white blood cell count, unspecified: Secondary | ICD-10-CM

## 2015-02-07 LAB — CBC WITH 3 PART DIFF
ABS. LYMPHOCYTES: 1.7 10*3/uL (ref 1.1–5.9)
ABS. MIXED CELLS: 0.6 10*3/uL (ref 0.0–2.3)
ABS. NEUTROPHILS: 2.3 10*3/uL (ref 1.8–9.5)
HCT: 39.4 % (ref 36–48)
HGB: 13.1 g/dL (ref 12.0–16.0)
LYMPHOCYTES: 37 % (ref 14–44)
MCH: 27.5 PG (ref 25.0–35.0)
MCHC: 33.2 g/dL (ref 31–37)
MCV: 82.8 FL (ref 78–102)
Mixed cells: 12 % (ref 0.1–17)
NEUTROPHILS: 51 % (ref 40–70)
PLATELET: 209 10*3/uL (ref 140–440)
RBC: 4.76 M/uL (ref 4.10–5.10)
RDW: 17 % — ABNORMAL HIGH (ref 11.5–14.5)
WBC: 4.6 10*3/uL (ref 4.5–13.0)

## 2015-02-07 NOTE — Progress Notes (Signed)
Hematology/Oncology  Progress Note    Name: Melissa Leblanc  Date: 02/07/2015  DOB: [birth year]    XNA:TFTDD M. Starleen Blue, DO     Melissa Leblanc is a 49 year old female who was seen for management of her non-Hodgkin's lymphoma.    Current therapy: active surveillance, the patient previously completed 8 cycles of systemic chemotherapy with the Rituxan and CHOP regimen (2014)    Subjective:     Melissa Leblanc is a 49 year old African American woman has a history of non-Hodgkin's lymphoma. She has previously completed a full course of systemic chemotherapy with a combination of Rituxan and CHOP. She denies fevers, night sweats. Her appetite remains good and her weight is slowly continuing to increase. She reports having the left supraclavicular lymph node occasionally enlarging followed by regression.  There are no other complaints.    Past medical history, family history, and social history: these were reviewed and remains unchanged.    Past Medical History   Diagnosis Date   ??? Hypertension    ??? Prediabetes    ??? VAIN I (vaginal intraepithelial neoplasia grade I)    ??? Anxiety    ??? Lymphoma ([place])    ??? Neck mass    ??? [facility])      lymph node cancer   ??? 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 GERD (gastroesophageal reflux disease)    ??? Asthma    ??? Bladder incontinence    ??? Depression 08/17/2014   ??? Depression 08/17/2014     Past Surgical History   Procedure Laterality Date   ??? Hx gyn       BTL   ??? Hx hysterectomy  2004   ??? Hx other surgical       lymph node biopsies neck   ??? Hx vascular access       double right chest mediport     History     Social History   ??? Marital Status: SINGLE     Spouse Name: N/A   ??? Number of Children: N/A   ??? Years of Education: N/A     Occupational History   ??? Not on file.     Social History Main Topics   ??? Smoking status: Current Every Day Smoker -- 0.25 packs/day for 25 years   ??? Smokeless tobacco: Never Used   ??? Alcohol Use: 3.0 oz/week     6 Cans of beer per week      Comment: every 3 to 4 days    ??? Drug Use: Yes     Special: Marijuana      Comment: one or 2 puffs occasionally   ??? Sexual Activity: No     Other Topics Concern   ??? Not on file     Social History Narrative     Family History   Problem Relation Age of Onset   ??? Cancer Mother    ??? Breast Cancer Mother    ??? Cancer Father    ??? Alcohol abuse Neg Hx    ??? Arthritis-rheumatoid Neg Hx    ??? Asthma Neg Hx    ??? Bleeding Prob Neg Hx    ??? Diabetes Neg Hx    ??? Elevated Lipids Neg Hx    ??? Headache Neg Hx    ??? Heart Disease Neg Hx    ??? Hypertension Neg Hx    ??? Lung Disease Neg Hx    ??? Migraines Neg Hx    ???  Psychiatric Disorder Neg Hx    ??? Stroke Neg Hx    ??? Mental Retardation Neg Hx      Current Outpatient Prescriptions   Medication Sig Dispense Refill   ??? oxyCODONE-acetaminophen (PERCOCET 10) 10-325 mg per tablet Take 1 Tab by mouth every six (6) hours as needed for Pain. Max Daily Amount: 4 Tabs. 120 Tab 0   ??? cyclobenzaprine (FLEXERIL) 5 mg tablet Take 1 Tab by mouth three (3) times daily as needed for Muscle Spasm(s). 90 Tab 3   ??? fluticasone (FLONASE) 50 mcg/actuation nasal spray 2 Sprays by Both Nostrils route daily. 1 Bottle 3   ??? traZODone (DESYREL) 50 mg tablet Take 1 Tab by mouth nightly. 90 Tab 1   ??? lisinopril-hydrochlorothiazide (PRINZIDE, ZESTORETIC) 20-25 mg per tablet take 1 tablet by mouth once daily 90 Tab 1   ??? warfarin (COUMADIN) 1 mg tablet Take 1 tab po qd at 6pm or after 30 Tab 6   ??? guaiFENesin-codeine (ROBITUSSIN AC) 10-100 mg/5 mL solution Take 5 mL by mouth three (3) times daily as needed for Cough. Max Daily Amount: 15 mL. 240 mL 3   ??? lidocaine-prilocaine (EMLA) topical cream Apply to mediport 1 hour prior to chemo 30 g 6   ??? prochlorperazine (COMPAZINE) 10 mg tablet Take 1 tab po Q 6 hours with Benadryl 19m for nausea 60 Tab 2   ??? diphenhydrAMINE (BENADRYL) 25 mg capsule Take 1 tab po Q 6 hours with compazine for nausea 30 Cap 2     Facility-Administered Medications Ordered in Other Visits    Medication Dose Route Frequency Provider Last Rate Last Dose   ??? heparin (porcine) 100 unit/mL injection 500 Units  500 Units InterCATHeter PRN PEden Emms MD       ??? sodium chloride (NS) flush 10-40 mL  10-40 mL IntraVENous Q8H PEden Emms MD           Review of Systems  Constitutional: The patient has no acute distress or discomfort.  HEENT: The patient denies recent head trauma, eye pain, blurred vision,  hearing deficit, oropharyngeal mucosal pain or lesions, and the patient denies throat pain or discomfort.  Lymphatics: The patient denies palpable peripheral lymphadenopathy.  Hematologic: The patient denies having bruising, bleeding, or progressive fatigue.  Respiratory: the patient is complaining of some cough, low-grade fever and running nose.  Cardiovascular: The patient denies having leg pain, leg swelling, heart palpitations, chest permit, chest pain, or lightheadedness.  The patient denies having dyspnea on exertion.  Gastrointestinal: The patient denies having nausea, emesis, or diarrhea. The patient denies having any hematemesis or blood in the stool.  Genitourinary: Patient denies having urinary urgency, frequency, or dysuria.  The patient denies having blood in the urine.  Psychological: The patient denies having symptoms of nervousness, anxiety, depression, or thoughts of harming herself  Or anybody .  Skin: Patient denies having skin rashes, skin, ulcerations, or unexplained itching or pruritus.  Musculoskeletal: The patient denies having pain in the joints or bones.      Objective:   BP 129/79 mmHg   Pulse 79   Temp(Src) 98.3 ??F (36.8 ??C) (Oral)   Wt 93.441 kg (206 lb)  ECOG PS=0, pain score=4/10    Physical Exam:   Gen. Appearance: The patient is in no acute distress.  Skin: There is no bruise or rash.  HEENT: The exam is unremarkable.  Neck: Supple without lymphadenopathy or thyromegaly.  Lungs: Clear to auscultation and percussion; there are no  wheezes or rhonchi.  Heart: Regular rate and  rhythm; there are no murmurs, gallops, or rubs.  Abdomen: Bowel sounds are present and normal.  There is no guarding, tenderness, or hepatosplenomegaly.  Extremities: There is no clubbing, cyanosis, or edema.  Neurologic: There are no focal neurologic deficits.  Lymphatics: There is no palpable peripheral lymphadenopathy. Musculoskeletal: The patient has full range of motion at all joints.  There is no evidence of joint deformity or effusions.  There is no focal joint tenderness.  Psychological/psychiatric: There is no clinical evidence of anxiety, depression, or melancholy.    Lab data:      Results for orders placed or performed during the hospital encounter of 02/07/15   CBC WITH 3 PART DIFF     Status: Abnormal   Result Value Ref Range Status    WBC 4.6 4.5 - 13.0 K/uL Final    RBC 4.76 4.10 - 5.10 M/uL Final    HGB 13.1 12.0 - 16.0 g/dL Final    HCT 39.4 36 - 48 % Final    MCV 82.8 78 - 102 FL Final    MCH 27.5 25.0 - 35.0 PG Final    MCHC 33.2 31 - 37 g/dL Final    RDW 17.0 (H) 11.5 - 14.5 % Final    PLATELET 209 140 - 440 K/uL Final    NEUTROPHILS 51 40 - 70 % Final    MIXED CELLS 12 0.1 - 17 % Final    LYMPHOCYTES 37 14 - 44 % Final    ABS. NEUTROPHILS 2.3 1.8 - 9.5 K/UL Final    ABS. MIXED CELLS 0.6 0.0 - 2.3 K/uL Final    ABS. LYMPHOCYTES 1.7 1.1 - 5.9 K/UL Final     Comment: Test performed at [facility]. Results Reviewed by Medical Director.    DF AUTOMATED   Final           Assessment:   1. Leukopenia, unspecified type    2. Hodgkin's disease of head, face, and neck ([place])    3. Anemia, unspecified type      Plan:   Non-Hodgkin's lymphoma: On June 01, 2014 the patient had a CT scan of the chest, abdomen, and pelvis to rule out any evidence of recurrent lymphoma. The imaging studies did not reveal any evidence of lymphadenopathy or recurrent disease. The small axillary lymph nodes are also stable and there is no new lymphadenopathy. I will order a sed rate  today. Follow-up will  Continue at 4 month intervals      Leukopenia: The current CBC shows her WBC today normal at 4.6 and Neutrophils is 51% with absolute Neutrophils of 2.3. No  Intervention is warranted at this time.     Iron deficiency anemia: The patient has continued to take the over-the-counter iron supplement once daily. Her current hemoglobin is 13.1 g/dL with hematocrit of 39.4. I recommended that she continue to take the iron supplement once daily.    I will have the patient in clinic for reassessment in 4 months or sooner if indicated.      Orders Placed This Encounter   ??? COMPLETE CBC & AUTO DIFF WBC   ??? InHouse CBC ([facility])     Standing Status: Future      Number of Occurrences: 1      Standing Expiration Date: 02/14/2015   ??? IRON PROFILE     Standing Status: Future      Number of Occurrences:  Standing Expiration Date: 02/08/2016   ??? FERRITIN     Standing Status: Future      Number of Occurrences:       Standing Expiration Date: 0/81/4481   ??? METABOLIC PANEL, COMPREHENSIVE     Standing Status: Future      Number of Occurrences:       Standing Expiration Date: 02/08/2016   ??? SED RATE (ESR)     Standing Status: Future      Number of Occurrences:       Standing Expiration Date: 02/08/2016       Bunnie Pion, NP  02/07/2015      Please note: This document has been produced using voice recognition software.  Unrecognized errors in transcription may be present.

## 2015-02-21 ENCOUNTER — Encounter: Payer: MEDICARE | Primary: Family

## 2015-02-24 ENCOUNTER — Inpatient Hospital Stay: Admit: 2015-02-24 | Payer: MEDICARE | Primary: Family

## 2015-02-24 DIAGNOSIS — Z452 Encounter for adjustment and management of vascular access device: Secondary | ICD-10-CM

## 2015-02-24 MED ORDER — HEPARIN LOCK FLUSH 100 UNIT/ML IV SOLN
100 unit/mL | INTRAVENOUS | Status: AC | PRN
Start: 2015-02-24 — End: 2015-02-25
  Administered 2015-02-24: 12:00:00 via INTRAVENOUS

## 2015-02-24 MED ORDER — SODIUM CHLORIDE 0.9 % IJ SYRG
INTRAMUSCULAR | Status: DC | PRN
Start: 2015-02-24 — End: 2015-02-28
  Administered 2015-02-24: 12:00:00 via INTRAVENOUS

## 2015-02-24 MED FILL — HEPARIN LOCK FLUSH 100 UNIT/ML IV SOLN: 100 unit/mL | INTRAVENOUS | Qty: 5

## 2015-02-24 MED FILL — BD POSIFLUSH NORMAL SALINE 0.9 % INJECTION SYRINGE: INTRAMUSCULAR | Qty: 40

## 2015-02-24 NOTE — Progress Notes (Signed)
[facility] Progress Note    Date: February 24, 2015    Name: Melissa Leblanc    MRN: [id number]         DOB: 24-Jun-1966    Monthly Port flush    [name] to [facility], ambulatory, at [phone]. Pt was assessed and education was provided. Denies any complaints.     [name]'s vitals were reviewed.  Visit Vitals   Item Reading   ??? BP 122/81 mmHg   ??? Pulse 74   ??? Temp 99 ??F (37.2 ??C)   ??? Resp 18   ??? SpO2 98%   ??? Breastfeeding No       Right medial chest mediport was accessed with 20 gauge 1 inch huber(s) after chloroprep.    Port flushed easily and had brisk blood return. Mediport flushed with NS 20 ml and Heparin 500 units then de-accessed.     No irritation or bleeding noted. Band-Aid applied.    Right lateral chest mediport was accessed but would not flush nor had blood return. Pt did not want to keep trying and she said "it's fine for this time." De accessed and covered with a Band-Aid.     [name] tolerated the procedure, and had no complaints. Patient armband removed and shredded.    [name] was discharged from [facility] in stable condition at 0830. She is to return on 03/24/2015 at 0830 for her next appointment.    [name], RN  February 24, 2015

## 2015-02-28 MED ORDER — OXYCODONE-ACETAMINOPHEN 10 MG-325 MG TAB
10-325 mg | ORAL_TABLET | Freq: Four times a day (QID) | ORAL | Status: DC | PRN
Start: 2015-02-28 — End: 2015-03-29

## 2015-02-28 MED ORDER — WARFARIN 1 MG TAB
1 mg | ORAL_TABLET | ORAL | Status: DC
Start: 2015-02-28 — End: 2015-03-01

## 2015-02-28 NOTE — Telephone Encounter (Signed)
Pt requesting refill for Percocet.

## 2015-03-01 MED ORDER — WARFARIN 1 MG TAB
1 mg | ORAL_TABLET | ORAL | Status: DC
Start: 2015-03-01 — End: 2015-03-24

## 2015-03-01 MED ORDER — WARFARIN 1 MG TAB
1 mg | ORAL_TABLET | ORAL | Status: DC
Start: 2015-03-01 — End: 2016-01-09

## 2015-03-24 ENCOUNTER — Inpatient Hospital Stay: Admit: 2015-03-24 | Payer: MEDICARE | Primary: Family

## 2015-03-24 DIAGNOSIS — C8190 Hodgkin lymphoma, unspecified, unspecified site: Secondary | ICD-10-CM

## 2015-03-24 MED ORDER — HEPARIN LOCK FLUSH 100 UNIT/ML IV SOLN
100 unit/mL | INTRAVENOUS | Status: AC | PRN
Start: 2015-03-24 — End: 2015-03-26

## 2015-03-24 MED ORDER — HEPARIN LOCK FLUSH 100 UNIT/ML IV SOLN
100 unit/mL | INTRAVENOUS | Status: AC
Start: 2015-03-24 — End: 2015-03-24
  Administered 2015-03-24: 13:00:00

## 2015-03-24 MED ORDER — SODIUM CHLORIDE 0.9 % IJ SYRG
INTRAMUSCULAR | Status: DC | PRN
Start: 2015-03-24 — End: 2015-03-28
  Administered 2015-03-24: 13:00:00 via INTRAVENOUS

## 2015-03-24 MED FILL — BD POSIFLUSH NORMAL SALINE 0.9 % INJECTION SYRINGE: INTRAMUSCULAR | Qty: 40

## 2015-03-24 MED FILL — HEPARIN LOCK FLUSH 100 UNIT/ML IV SOLN: 100 unit/mL | INTRAVENOUS | Qty: 10

## 2015-03-24 NOTE — Progress Notes (Signed)
[facility] Progress Note    Date: March 24, 2015    Name: Melissa Leblanc    MRN: [id number]         DOB: 05/14/1966    Monthly Port flush    Melissa Leblanc to [place], ambulatory, at [phone].Marland Kitchen Pt was assessed and education was provided. Denies any complaints.     Melissa Leblanc's vitals were reviewed.   Patient Vitals for the past 12 hrs:   Temp Pulse Resp BP SpO2   03/24/15 0914 - - - 128/88 mmHg -   03/24/15 0856 99.2 ??F (37.3 ??C) 84 20 (!) 145/104 mmHg 94 %       Visit Vitals   Item Reading   ??? BP 128/88 mmHg   ??? Pulse 84   ??? Temp 99.2 ??F (37.3 ??C)   ??? Resp 20   ??? Ht 5\' 5"  (1.651 m)   ??? Wt 96.645 kg (213 lb 1 oz)   ??? BMI 35.46 kg/m2   ??? SpO2 94%     Patient's blood pressure elevated upon arrival patients states she is nervous for the procedure and she has not taken her b/p medications today.  Patient instructed that she should take her b/p medication in the morning before coming here.    Right medial chest mediport was accessed with 20 gauge 1 inch huber(s) after chloroprep.    Port flushed easily and had brisk blood return. Mediport flushed with NS 20 ml and Heparin 500 units then de-accessed.     No irritation or bleeding noted. Band-Aid applied.    Right lateral chest mediport was not accessed, patient refused. Patient instructed that it needs to be accessed on next visit since this is the third time it has not been accessed.    Upon the completion of her procedure rechecked b/p and it was 128/88.    Melissa Leblanc tolerated the procedure, and had no complaints. Patient armband removed and shredded.    Melissa Leblanc was discharged from [facility] in stable condition at 0830. She is to return on 04/21/2015 at 0830 for her next appointment.    Josephina Shih, RN  March 24, 2015

## 2015-03-29 NOTE — Telephone Encounter (Signed)
Patient said just print both of them.  Patient also has questions regarding the port and said it is beginning to sink into her skin, and it is painful to get flush.  Please call her back about that asap.

## 2015-03-30 MED ORDER — LIDOCAINE-PRILOCAINE 2.5 %-2.5 % TOPICAL CREAM
CUTANEOUS | 6 refills | Status: DC
Start: 2015-03-30 — End: 2015-04-13

## 2015-03-30 MED ORDER — OXYCODONE-ACETAMINOPHEN 10 MG-325 MG TAB
10-325 mg | ORAL_TABLET | Freq: Four times a day (QID) | ORAL | 0 refills | Status: DC | PRN
Start: 2015-03-30 — End: 2015-04-28

## 2015-03-31 MED ORDER — LISINOPRIL-HYDROCHLOROTHIAZIDE 20 MG-25 MG TAB
20-25 mg | ORAL_TABLET | ORAL | 1 refills | Status: DC
Start: 2015-03-31 — End: 2016-02-07

## 2015-04-11 NOTE — Telephone Encounter (Signed)
Pt has apt scheduled in spetember

## 2015-04-13 ENCOUNTER — Encounter

## 2015-04-13 ENCOUNTER — Ambulatory Visit: Admit: 2015-04-13 | Payer: MEDICARE | Attending: Obstetrics & Gynecology | Primary: Family

## 2015-04-13 DIAGNOSIS — Z01419 Encounter for gynecological examination (general) (routine) without abnormal findings: Secondary | ICD-10-CM

## 2015-04-13 NOTE — Patient Instructions (Addendum)
Well Visit, Ages 18 to 50: Care Instructions  Your Care Instructions  Physical exams can help you stay healthy. Your doctor has checked your overall health and may have suggested ways to take good care of yourself. He or she also may have recommended tests. At home, you can help prevent illness with healthy eating, regular exercise, and other steps.  Follow-up care is a key part of your treatment and safety. Be sure to make and go to all appointments, and call your doctor if you are having problems. It's also a good idea to know your test results and keep a list of the medicines you take.  How can you care for yourself at home?  ?? Reach and stay at a healthy weight. This will lower your risk for many problems, such as obesity, diabetes, heart disease, and high blood pressure.  ?? Get at least 30 minutes of physical activity on most days of the week. Walking is a good choice. You also may want to do other activities, such as running, swimming, cycling, or playing tennis or team sports. Discuss any changes in your exercise program with your doctor.  ?? Do not smoke or allow others to smoke around you. If you need help quitting, talk to your doctor about stop-smoking programs and medicines. These can increase your chances of quitting for good.  ?? Talk to your doctor about whether you have any risk factors for sexually transmitted infections (STIs). Having one sex partner (who does not have STIs and does not have sex with anyone else) is a good way to avoid these infections.  ?? Use birth control if you do not want to have children at this time. Talk with your doctor about the choices available and what might be best for you.  ?? Always wear sunscreen on exposed skin. Make sure the sunscreen blocks ultraviolet rays (both UVA and UVB) and has a sun protection factor (SPF) of at least 15. Use it every day, even when it is cloudy. Some doctors may recommend a higher SPF, such as 30.   ?? See a dentist one or two times a year for checkups and to have your teeth cleaned.  ?? Wear a seat belt in the car.  ?? Drink alcohol in moderation, if at all. That means no more than 2 drinks a day for men and 1 drink a day for women.  Follow your doctor's advice about when to have certain tests. These tests can spot problems early.  For everyone  ?? Cholesterol. Have the fat (cholesterol) in your blood tested after age 20. Your doctor will tell you how often to have this done based on your age, family history, or other things that can increase your risk for heart disease.  ?? Blood pressure. Experts suggest that healthy adults with normal blood pressure (119/79 mm Hg or below) have their blood pressure checked at least every 1 to 2 years. This can be done during a routine doctor visit. If you have slightly higher or high blood pressure, your doctor will suggest more frequent tests.  ?? Vision. Talk with your doctor about how often to have a glaucoma test.  ?? Diabetes. Ask your doctor whether you should have tests for diabetes.  ?? Colon cancer. Have a test for colon cancer at age 50. You may have one of several tests. If you are younger than 50, you may need a test earlier if you have any risk factors. Risk factors include whether you already had   a precancerous polyp removed from your colon or whether your parent, brother, sister, or child has had colon cancer.  For women  ?? Breast exam and mammogram. Talk to your doctor about when you should have a clinical breast exam and a mammogram. Medical experts differ on whether and how often women under 50 should have these tests. Your doctor can help you decide what is right for you.  ?? Pap test and pelvic exam. Begin Pap tests at age 52. A Pap test is the best way to find cervical cancer. The test often is part of a pelvic exam. Ask how often to have this test.  ?? Tests for sexually transmitted infections (STIs). Ask whether you should  have tests for STIs. You may be at risk if you have sex with more than one person, especially if your partners do not wear condoms.  For men  ?? Tests for sexually transmitted infections (STIs). Ask whether you should have tests for STIs. You may be at risk if you have sex with more than one person, especially if you do not wear a condom.  ?? Testicular cancer exam. Ask your doctor whether you should check your testicles regularly.  ?? Prostate exam. Talk to your doctor about whether you should have a blood test (called a PSA test) for prostate cancer. Experts differ on whether and when men should have this test. Some experts suggest it if you are older than 7 and are African-American or have a father or brother who got prostate cancer when he was younger than 65.  When should you call for help?  Watch closely for changes in your health, and be sure to contact your doctor if you have any problems or symptoms that concern you.  Where can you learn more?  Go to [employer]  Enter P072 in the search box to learn more about "Well Visit, Ages 2 to 35: Care Instructions."  ?? 2006-2016 Healthwise, Incorporated. Care instructions adapted under license by [employer] (which disclaims liability or warranty for this information). This care instruction is for use with your licensed healthcare professional. If you have questions about a medical condition or this instruction, always ask your healthcare professional. [place] any warranty or liability for your use of this information.  Content Version: 10.9.538570; Current as of: September 26, 2014        Learning About Colonoscopy  What is a colonoscopy?     A colonoscopy is a test (also called a procedure) that lets a doctor look inside your large intestine. The doctor uses a thin, lighted tube called a colonoscope. The doctor uses it to look for small growths called polyps,  colon or rectal cancer (colorectal cancer), or other problems like bleeding.  During the procedure, the doctor can take samples of tissue. The samples can then be checked for cancer or other conditions. The doctor can also take out polyps.  How is colonoscopy done?  This procedure is done in a doctor's office or a clinic or hospital. You will get medicine to help you relax and not feel pain. Some people find that they do not remember having the test because of the medicine.  The doctor gently moves the colonoscope, or scope, through the colon. The scope is also a small video camera. It lets the doctor see the colon and take pictures.  A colonoscopy usually takes 30 to 45 minutes. It may take longer if the doctor has to remove polyps.  How do you  prepare for the procedure?  You need to clean out your colon before the procedure so the doctor can see all of your colon. You may start the cleaning process a day or two before the test. This depends on which "colon prep" your doctor recommends.  To clean your colon, you stop eating solid foods and drink only clear liquids. You can have water, tea, coffee, clear juices, clear broths, flavored ice pops, and gelatin (such as Jell-O). Do not drink anything red or purple, such as grape juice or fruit punch. And do not eat red or purple foods, such as grape ice pops or cherry gelatin.  The day or night before the procedure, you drink a large amount of a special liquid. This causes loose, frequent stools. You will go to the bathroom a lot. It is very important to drink all of the colon prep liquid. If you have problems drinking the liquid, call your doctor.  For many people, the prep is worse than the test. It may be uncomfortable, and you may feel hungry on the clear liquid diet. Some people do not go to work or do their usual activities on the day of the prep.  Arrange to have someone take you home after the test.  What can you expect after a colonoscopy?   The nurses will watch you for 1 to 2 hours until the medicines wear off. Then you can go home. You will need a ride. Your doctor will tell you when you can eat and do your usual activities.  Your doctor will talk to you about when you will need your next colonoscopy. The results of your test and your risk for colorectal cancer will help your doctor decide how often you need to be checked.  Follow-up care is a key part of your treatment and safety. Be sure to make and go to all appointments, and call your doctor if you are having problems. It's also a good idea to know your test results and keep a list of the medicines you take.  Where can you learn more?  Go to [employer]at  Enter [phone] in the search box to learn more about "Learning About Colonoscopy."  ?? 2006-2016 Healthwise, Incorporated. Care instructions adapted under license by [employer] (which disclaims liability or warranty for this information). This care instruction is for use with your licensed healthcare professional. If you have questions about a medical condition or this instruction, always ask your healthcare professional. [place] any warranty or liability for your use of this information.  Content Version: 10.9.538570; Current as of: July 08, 2014        Mammogram: About This Test  What is it?  A mammogram is an X-ray of the breast that is used to screen for breast cancer. This test can find tumors that are too small for you or your doctor to feel. Cancer is most easily treated and cured when it is found at an early stage.  Why is this test done?  A mammogram is done to:  ?? Look for breast cancer in women who don't have symptoms.  ?? Find breast cancer in women who have symptoms. Symptoms of breast cancer may include a lump or thickening in the breast, nipple discharge, or dimpling of the skin on one area of the breast.   ?? Find an area of suspicious breast tissue to remove for an exam under a microscope (biopsy).  How can you prepare for the test?  ?? Tell  your doctor if you:  ?? Are or might be pregnant.  ?? Are breastfeeding.  ?? Have breast implants.  ?? Have previously had a breast biopsy.  ?? On the day of the test, don't use any deodorant, perfume, powders, or ointments.  What happens before the test?  ?? You will need to take off any jewelry that might interfere with the X-ray pictures.  ?? You will need to take off your clothes above the waist.  ?? You will be given a cloth or paper gown to use during the test.  What happens during the test?  ?? You usually stand during a mammogram.  ?? One at a time, your breasts will be placed on a flat plate that contains the X-ray film.  ?? Another plate is then pressed firmly against your breast to help flatten out the breast tissue. You may be asked to lift your arm.  ?? For a few seconds while the X-ray picture is being taken, you will need to hold your breath.  ?? At least two pictures are taken of each breast. One is taken from the top and one from the side.  What else should you know about the test?  ?? The X-ray plate will feel cold when you place your breast on it. Having your breasts flattened and squeezed isn't comfortable. But it is necessary to flatten out the breast tissue to get the best pictures.  ?? Mammograms do not prevent breast cancer or reduce a woman's risk of developing cancer.  ?? Most things that are found during a mammogram are not breast cancer.  How long does the test take?  ?? The test will take about 10 to 15 minutes. You may be in the clinic for up to an hour.  What happens after the test?  ?? You will probably be able to go home right away.  ?? You can go back to your usual activities right away.  Follow-up care is a key part of your treatment and safety. Be sure to make and go to all appointments, and call your doctor if you are having  problems. It's also a good idea to keep a list of the medicines you take. Ask your doctor when you can expect to have your test results.  Where can you learn more?  Go to [employer]  Enter Z238 in the search box to learn more about "Mammogram: About This Test."  ?? 2006-2016 Healthwise, Incorporated. Care instructions adapted under license by [employer] (which disclaims liability or warranty for this information). This care instruction is for use with your licensed healthcare professional. If you have questions about a medical condition or this instruction, always ask your healthcare professional. [place] any warranty or liability for your use of this information.  Content Version: 10.9.538570; Current as of: July 08, 2014        Bladder Training: Care Instructions  Your Care Instructions  Bladder training is used to treat urge incontinence and stress incontinence. Urge incontinence means that the need to urinate comes on so fast that you can't get to a toilet in time. Stress incontinence means that you leak urine because of pressure on your bladder. For example, it may happen when you laugh, cough, or lift something heavy.  Bladder training can increase how long you can wait before you have to urinate. It can also help your bladder hold more urine. And it can give you better control over the urge to urinate.  It  is important to remember that bladder training takes a few weeks to a few months to make a difference. You may not see results right away, but don't give up.  Follow-up care is a key part of your treatment and safety. Be sure to make and go to all appointments, and call your doctor if you are having problems. It's also a good idea to know your test results and keep a list of the medicines you take.  How can you care for yourself at home?  Work with your doctor to come up with a bladder training program that is  right for you. You may use one or more of the following methods.  Delayed urination  ?? In the beginning, try to keep from urinating for 5 minutes after you first feel the need to go.  ?? While you wait, take deep, slow breaths to relax. Kegel exercises can also help you delay the need to go to the bathroom.  ?? After some practice, when you can easily wait 5 minutes to urinate, try to wait 10 minutes before you urinate.  ?? Slowly increase the waiting period until you are able to control when you have to urinate.  Scheduled urination  ?? Empty your bladder when you first wake up in the morning.  ?? Schedule times throughout the day when you will urinate.  ?? Start by going to the bathroom every hour, even if you don't need to go.  ?? Slowly increase the time between trips to the bathroom.  ?? When you have found a schedule that works well for you, keep doing it.  ?? If you wake up during the night and have to urinate, do it. Apply your schedule to waking hours only.  Kegel exercises  These tighten and strengthen pelvic muscles, which can help you control the flow of urine. To do Kegel exercises:  ?? Squeeze the same muscles you would use to stop your urine. Your belly and thighs should not move.  ?? Hold the squeeze for 3 seconds, and then relax for 3 seconds.  ?? Start with 3 seconds. Then add 1 second each week until you are able to squeeze for 10 seconds.  ?? Repeat the exercise 10 to 15 times a session. Do three or more sessions a day.  When should you call for help?  Watch closely for changes in your health, and be sure to contact your doctor if:  ?? Your incontinence is getting worse.  ?? You do not get better as expected.  Where can you learn more?  Go to [employer]  Enter [phone] in the search box to learn more about "Bladder Training: Care Instructions."  ?? 2006-2016 Healthwise, Incorporated. Care instructions adapted under  license by [employer] (which disclaims liability or warranty for this information). This care instruction is for use with your licensed healthcare professional. If you have questions about a medical condition or this instruction, always ask your healthcare professional. [place] any warranty or liability for your use of this information.  Content Version: 10.9.538570; Current as of: July 08, 2014

## 2015-04-13 NOTE — Progress Notes (Signed)
Name: Melissa Leblanc MRN: [id number]   Date of Birth: May 30, 1966  Age: 49 y.o.  Sex: female        Chief Complaint   Patient presents with   ??? Well Woman       HPI  Denies depression  Does not eat a well balanced diet  Does not exercise except to the corner store and back, approx 30 mins  Denies domestic abuse  Last tdap unknown, sees Dr. Nadyne Coombes in November, will double check with him about vaccinations  Mammogram 12/2013  Colonoscopy needs to schedule    OB History     Gravida Para Term Preterm AB TAB SAB Ectopic Multiple Living    5 5 5  0 0 0 0 0 0 5        Obstetric Comments    No LMP recorded. Patient has had a hysterectomy.  History of sexually transmitted infection years ago  Last pap 2013 neg, h/o VAIN           History   Sexual Activity   ??? Sexual activity: No       Past Medical History   Diagnosis Date   ??? Anxiety    ??? Asthma    ??? Bladder incontinence    ??? Cancer [facility])      lymph node cancer   ??? Depression 08/17/2014   ??? Depression 08/17/2014   ??? 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 GERD (gastroesophageal reflux disease)    ??? Hypertension    ??? Lymphoma ([place])    ??? Neck mass    ??? Prediabetes    ??? VAIN I (vaginal intraepithelial neoplasia grade I)        Past Surgical History   Procedure Laterality Date   ??? Hx gyn       BTL   ??? Hx hysterectomy  2004   ??? Hx other surgical       lymph node biopsies neck   ??? Hx vascular access       double right chest mediport       No Known Allergies    Current Outpatient Prescriptions on File Prior to Visit   Medication Sig Dispense Refill   ??? lisinopril-hydrochlorothiazide (PRINZIDE, ZESTORETIC) 20-25 mg per tablet take 1 tablet by mouth once daily 90 Tab 1   ??? oxyCODONE-acetaminophen (PERCOCET 10) 10-325 mg per tablet Take 1 Tab by mouth every six (6) hours as needed for Pain. Max Daily Amount: 4 Tabs. 120 Tab 0   ??? warfarin (COUMADIN) 1 mg tablet TAKE 1 TABLET BY MOUTH EVERY DAY AT 6 PM OR AFTER 30 Tab 6    ??? cyclobenzaprine (FLEXERIL) 5 mg tablet Take 1 Tab by mouth three (3) times daily as needed for Muscle Spasm(s). 90 Tab 3   ??? traZODone (DESYREL) 50 mg tablet Take 1 Tab by mouth nightly. 90 Tab 1   ??? fluticasone (FLONASE) 50 mcg/actuation nasal spray 2 Sprays by Both Nostrils route daily. 1 Bottle 3     No current facility-administered medications on file prior to visit.        Social History     Social History   ??? Marital status: SINGLE     Spouse name: N/A   ??? Number of children: N/A   ??? Years of education: N/A     Occupational History   ??? Not on file.     Social History Main Topics   ??? Smoking status:  Current Every Day Smoker     Packs/day: 0.50     Years: 28.00   ??? Smokeless tobacco: Never Used      Comment: Seeing Dr. Starleen Blue to help her   ??? Alcohol use 1.8 - 2.4 oz/week     3 - 4 Glasses of wine per week      Comment: every 3 to 4 days   ??? Drug use: Yes     Special: Marijuana      Comment: one or 2 puffs occasionally   ??? Sexual activity: No     Other Topics Concern   ??? Not on file     Social History Narrative       Family History   Problem Relation Age of Onset   ??? Cancer Mother      throat   ??? Breast Cancer Mother    ??? Colon Cancer Mother    ??? Hypertension Mother    ??? Colon Cancer Father    ??? Pacemaker Father    ??? Cancer Sister      thigh that moved to lung, sounds like myosarcoma?   ??? Hypertension Sister    ??? Alcohol abuse Neg Hx    ??? Arthritis-rheumatoid Neg Hx    ??? Asthma Neg Hx    ??? Bleeding Prob Neg Hx    ??? Diabetes Neg Hx    ??? Elevated Lipids Neg Hx    ??? Headache Neg Hx    ??? Lung Disease Neg Hx    ??? Migraines Neg Hx    ??? Psychiatric Disorder Neg Hx    ??? Stroke Neg Hx    ??? Mental Retardation Neg Hx        Review of Systems   Constitutional: Negative.    HENT: Positive for congestion (allergies).    Respiratory: Negative.    Cardiovascular: Negative.    Gastrointestinal: Negative.    Genitourinary: Negative.    Musculoskeletal: Negative.    Skin: Negative.     Neurological: Positive for tingling (in her limbs, because of her chemo). Negative for dizziness and headaches.   Psychiatric/Behavioral: Negative.    has problems with urgency      Visit Vitals   ??? BP 126/81 (BP 1 Location: Left arm, BP Patient Position: Sitting)   ??? Pulse 82   ??? Ht 5\' 5"  (1.651 m)   ??? Wt 213 lb (96.6 kg)   ??? BMI 35.45 kg/m2       GENERAL:  Well developed, well nourished, in no distress  NEURO/PSYCHE: Grossly intact, normal mood and affect  HEENT: Normal cephalic, atraumatic, good dentition, neck supple. No thyromegaly  BREASTS: breasts appear normal, no suspicious masses, no skin or nipple changes   CV: regular rate and rhythm  LUNGS: clear to auscultation bilaterally, no wheezes, rhonchi or rales, good air entry with normal effort, mediport noted in upper chest  ABDOMEN: + BS, soft without tenderness, protuberant, no guarding, rebound or masses  EXTREMITIES: no edema or erythema noted  SKIN:  Warm, dry, no lesions  LYMPHATICS: No supraclavicular, axillary or inguinal nodes noted    PELVIC EXAM:  LABIA MAJORA: no masses, tenderness or lesions  LABIA MINORA: no masses, tenderness or lesions  CLITORIS: no masses, tenderness or lesions  URETHRA: normal appearing, no masses or tenderness  BLADDER: no fullness or tenderness  VAGINA: pink appearing vagina with physiologic discharge, no lesions   PERINEUM: no masses, tenderness or lesions  CERVIX: No CMT or lesions  UTERUS: absent  ADNEXA: nontender and no masses      1. Well woman exam with routine gynecological exam  Reviewed diet, weight, exercise, and domestic abuse. Mammogram ordered and info for colonoscopy discussed. Reviewed tetanus vaccine, advised to check with Dr. Starleen Blue and Dr. Nadyne Coombes. All of her questions were discussed.    - PAP, IG, RFX HPV ASCUS (235361); Future  - MAM MAMMO BI SCREENING DIGTL; Future    2. VAIN (vaginal intraepithelial neoplasia)    - PAP, IG, RFX HPV ASCUS (443154); Future    3. Family history of colon cancer       4. Urinary urgency  Will check for UTI and do PVR, may benefit from PFPT    - CULTURE, URINE; Future    5. Boil of buttock    - REFERRAL TO DERMATOLOGY      F/U 2 weeks

## 2015-04-15 LAB — CULTURE, URINE
Urine Culture, Routine: NO GROWTH
Urine Culture, Routine: NO GROWTH

## 2015-04-18 LAB — PAP, IG, RFX HPV ASCUS (507301)
.: 0
LABCORP 019018: 0

## 2015-04-18 NOTE — Progress Notes (Signed)
Review at next office visit 9/1

## 2015-04-20 ENCOUNTER — Ambulatory Visit: Admit: 2015-04-20 | Payer: MEDICARE | Attending: Obstetrics & Gynecology | Primary: Family

## 2015-04-20 DIAGNOSIS — R3915 Urgency of urination: Secondary | ICD-10-CM

## 2015-04-20 NOTE — Progress Notes (Signed)
[facility]  OFFICE PROCEDURE PROGRESS NOTE        Chart reviewed for the following:   I, Dr. Ferman Hamming, have reviewed the History, Physical and updated the Allergic reactions for [name] performed immediately prior to start of procedure:   I, Dr. Ferman Hamming, have performed the following reviews on Melissa Leblanc prior to the start of the procedure:            * Patient was identified by name and date of birth   * Agreement on procedure being performed was verified  * Risks and Benefits explained to the patient  * Procedure site verified and marked as necessary  * Patient was positioned for comfort  * Consent was signed and verified     Time: 0940       Date of procedure: 04/20/2015    Procedure performed by:  Dagoberto Reef, MD    Provider assisted by: Brita Romp LPN    Patient assisted by: self    How tolerated by patient: tolerated the procedure well with no complications    Post Procedural Pain Scale: 0 - No Hurt    Comments: none      ??  1. VAIN (vaginal intraepithelial neoplasia)  ??  - PAP, IG, RFX HPV ASCUS neg  ??  ??  2. Urinary urgency  Pt here today to check for PVR  ??  - CULTURE, URINE neg    Visit Vitals   ??? BP (!) 145/95 (BP 1 Location: Left arm, BP Patient Position: Sitting)   ??? Pulse 83   ??? Ht 5\' 5"  (1.651 m)   ??? Wt 213 lb (96.6 kg)   ??? BMI 35.45 kg/m2     Procedure: After consent was obtained, the urethra was wiped with betadine x 2. After the patient voided, a 12 French catheter was placed with 30 mL of residual. The catheter was removed. Pt tolerated the procedure well.      1. Urinary urgency  PVR low, will refer to PFPT for bladder retraining. All of her questions were answered.      F/U PRN

## 2015-04-20 NOTE — Patient Instructions (Addendum)
Urge Incontinence in Women: Care Instructions  Your Care Instructions  Urge incontinence occurs when the need to urinate is so strong that you cannot reach the toilet in time, even when your bladder contains only a small amount of urine. This is also called overactive bladder or unstable bladder. Some women may have no warning before they leak urine. This condition does not cause major health problems, but it can be embarrassing and can affect a woman's self-esteem and confidence.  Treatment can cure or improve your symptoms.  Follow-up care is a key part of your treatment and safety. Be sure to make and go to all appointments, and call your doctor if you are having problems. It's also a good idea to know your test results and keep a list of the medicines you take.  How can you care for yourself at home?  ?? Take your medicines exactly as prescribed. Call your doctor if you think you are having a problem with your medicine. You will get more details on the specific medicines your doctor prescribes.  ?? Limit caffeine and alcohol???they stimulate urine production.  ?? Urinate every 2 to 4 hours during waking hours, even if you feel that you do not have to go.  ?? Do pelvic floor (Kegel) exercises, which tighten and strengthen pelvic muscles. To do Kegel exercises:  ?? Squeeze the same muscles you would use to stop your urine. Your belly and thighs should not move.  ?? Hold the squeeze for 3 seconds, then relax for 3 seconds.  ?? Start with 3 seconds. Then add 1 second each week until you are able to squeeze for 10 seconds.  ?? Repeat the exercise 10 to 15 times for each session. Do three or more sessions each day.  ?? Try wearing pads that absorb the leaks.  ?? Keep skin in the genital area dry.  When should you call for help?  Call your doctor now or seek immediate medical care if:  ?? You have symptoms of a urinary infection. For example:  ?? You have blood or pus in your urine.   ?? You have pain in your back just below your rib cage. This is called flank pain.  ?? You have a fever, chills, or body aches.  ?? It hurts to urinate.  ?? You have groin or belly pain.  Watch closely for changes in your health, and be sure to contact your doctor if:  ?? You have a hard time urinating when your bladder feels full.  ?? You feel like you need to urinate often.  ?? Your bladder feels full even after you urinate.  ?? Your symptoms are getting worse.  Where can you learn more?  Go to http://www.healthwise.net/GoodHelpConnections  Enter U825 in the search box to learn more about "Urge Incontinence in Women: Care Instructions."  ?? 2006-2016 Healthwise, Incorporated. Care instructions adapted under license by [employer] (which disclaims liability or warranty for this information). This care instruction is for use with your licensed healthcare professional. If you have questions about a medical condition or this instruction, always ask your healthcare professional. Healthwise, Incorporated disclaims any warranty or liability for your use of this information.  Content Version: 10.9.538570; Current as of: October 13, 2014

## 2015-04-21 ENCOUNTER — Inpatient Hospital Stay: Admit: 2015-04-21 | Payer: MEDICARE | Primary: Family

## 2015-04-21 DIAGNOSIS — Z452 Encounter for adjustment and management of vascular access device: Secondary | ICD-10-CM

## 2015-04-21 MED ORDER — HEPARIN LOCK FLUSH 100 UNIT/ML IV SOLN
100 unit/mL | INTRAVENOUS | Status: AC | PRN
Start: 2015-04-21 — End: 2015-04-22
  Administered 2015-04-21: 14:00:00 via INTRAVENOUS

## 2015-04-21 MED ORDER — HEPARIN LOCK FLUSH 100 UNIT/ML IV SOLN
100 unit/mL | INTRAVENOUS | Status: AC
Start: 2015-04-21 — End: 2015-04-21
  Administered 2015-04-21: 14:00:00 via INTRAVENOUS

## 2015-04-21 MED ORDER — SODIUM CHLORIDE 0.9 % IJ SYRG
INTRAMUSCULAR | Status: DC | PRN
Start: 2015-04-21 — End: 2015-04-24
  Administered 2015-04-21 (×4): via INTRAVENOUS

## 2015-04-21 MED FILL — BD POSIFLUSH NORMAL SALINE 0.9 % INJECTION SYRINGE: INTRAMUSCULAR | Qty: 40

## 2015-04-21 MED FILL — HEPARIN LOCK FLUSH 100 UNIT/ML IV SOLN: 100 unit/mL | INTRAVENOUS | Qty: 5

## 2015-04-21 MED FILL — HEPARIN LOCK FLUSH 100 UNIT/ML IV SOLN: 100 unit/mL | INTRAVENOUS | Qty: 10

## 2015-04-21 NOTE — Progress Notes (Signed)
[facility] OPIC Progress Note    Date: April 21, 2015    Name: Melissa Leblanc    MRN: [id number]         DOB: 05-14-1966      Ms. Melissa Leblanc arrived to [facility] at [phone].    Ms. Melissa Leblanc was assessed and education was provided.     Ms. Melissa Leblanc vitals were reviewed.  Visit Vitals   ??? BP 140/89 (BP 1 Location: Right arm, BP Patient Position: Sitting)   ??? Pulse 85   ??? Temp 98.9 ??F (37.2 ??C)   ??? Resp 18   ??? SpO2 98%   ??? Breastfeeding No       Pt was observed for 5 minutes after obtaining vital signs prior to initiating treatment.    Each lumen of patient's upper right double lumen chest port accessed.  No blood return noted in either sites, both flush without difficulty.  Patient states she can smell/taste the saline.      Flushed both lumens with NS and let both lumen set for approximately 45 minutes.  Flushed again with NS.     Blood return visualized in each lumen.  Flushed both lumens with normal saline followed by heparin per order.      De-accessed and band-aid applied to site.    Ms. Melissa Leblanc tolerated well without complaints.    Ms. Melissa Leblanc was discharged from [facility] in stable condition at [facility].  She is to return to [facility] on 05/19/15 at 0830 for her next port flush appointment.    Pleas Melissa Maerker, RN  April 21, 2015

## 2015-04-27 ENCOUNTER — Encounter: Attending: Obstetrics & Gynecology | Primary: Family

## 2015-04-29 ENCOUNTER — Inpatient Hospital Stay: Admit: 2015-04-29 | Payer: MEDICARE | Attending: Obstetrics & Gynecology | Primary: Family

## 2015-04-29 DIAGNOSIS — Z1231 Encounter for screening mammogram for malignant neoplasm of breast: Secondary | ICD-10-CM

## 2015-04-29 NOTE — Progress Notes (Signed)
Left message for pt to call back.

## 2015-05-01 NOTE — Progress Notes (Signed)
Please let pt know results are neg, thanks!

## 2015-05-03 MED ORDER — OXYCODONE-ACETAMINOPHEN 10 MG-325 MG TAB
10-325 mg | ORAL_TABLET | Freq: Four times a day (QID) | ORAL | 0 refills | Status: DC | PRN
Start: 2015-05-03 — End: 2015-05-30

## 2015-05-03 NOTE — Telephone Encounter (Signed)
Melissa Leblanc calling to see if her PERCOCET refill has been printed.  She is out.  She has no phone.  She will call back tomorrow around noon to see if it is ready.  Pt is aware that this refill is being worked on.  She asked that I remind you to refill it.

## 2015-05-03 NOTE — Telephone Encounter (Signed)
Rx printed and placed on Orson Aloe NP desk for signature for 05/04/15

## 2015-05-04 NOTE — Telephone Encounter (Signed)
LMTCB

## 2015-05-04 NOTE — Telephone Encounter (Signed)
-----   Message from Dagoberto Reef, MD sent at 05/01/2015 11:17 PM EDT -----  Please let pt know results are neg, thanks!

## 2015-05-11 ENCOUNTER — Encounter: Payer: MEDICARE | Primary: Family

## 2015-05-17 ENCOUNTER — Ambulatory Visit: Admit: 2015-05-17 | Discharge: 2015-05-17 | Payer: MEDICARE | Attending: Sports Medicine | Primary: Family

## 2015-05-17 DIAGNOSIS — Z0000 Encounter for general adult medical examination without abnormal findings: Secondary | ICD-10-CM

## 2015-05-17 MED ORDER — TRAZODONE 50 MG TAB
50 mg | ORAL_TABLET | Freq: Every evening | ORAL | 1 refills | Status: DC
Start: 2015-05-17 — End: 2016-03-11

## 2015-05-17 NOTE — Progress Notes (Signed)
Patient is currently not taking the following medications and wants them removed from their list:    n/a    Learning Assessment (baseline): complete  Depression Screening: complete      1. Have you been to the ER, urgent care clinic since your last visit?  Hospitalized since your last visit?No    2. Have you seen or consulted any other health care providers outside of the [facility] since your last visit?  Include any pap smears or colon screening. Yes ob/gyn and Oncology      Patient is due for the following immunizations:    Influenza: declined

## 2015-05-17 NOTE — Patient Instructions (Signed)
Advance Directives: Care Instructions  Your Care Instructions  An advance directive is a legal way to state your wishes at the end of your life. It tells your family and your doctor what to do if you can no longer say what you want.  There are two main types of advance directives. You can change them any time that your wishes change.  ?? A living will tells your family and your doctor your wishes about life support and other treatment.  ?? A medical power of attorney lets you name a person to make treatment decisions for you when you can't speak for yourself. This person is called a health care agent.  If you do not have an advance directive, decisions about your medical care may be made by a doctor or a judge who doesn't know you.  It may help to think of an advance directive as a gift to the people who care for you. If you have one, they won't have to make tough decisions by themselves.  Follow-up care is a key part of your treatment and safety. Be sure to make and go to all appointments, and call your doctor if you are having problems. It's also a good idea to know your test results and keep a list of the medicines you take.  How can you care for yourself at home?  ?? Discuss your wishes with your loved ones and your doctor. This way, there are no surprises.  ?? Many states have a unique form. Or you might use a universal form that has been approved by many states. This kind of form can sometimes be completed and stored online. Your electronic copy will then be available wherever you have a connection to the Internet. In most cases, doctors will respect your wishes even if you have a form from a different state.  ?? You don't need a lawyer to do an advance directive. But you may want to get legal advice.  ?? Think about these questions when you prepare an advance directive:  ?? Who do you want to make decisions about your medical care if you are not  able to? Many people choose a family member, close friend, or doctor.  ?? Do you know enough about life support methods that might be used? If not, talk to your doctor so you understand.  ?? What are you most afraid of that might happen? You might be afraid of having pain, losing your independence, or being kept alive by machines.  ?? Where would you prefer to die? Choices include your home, a hospital, or a nursing home.  ?? Would you like to have information about hospice care to support you and your family?  ?? Do you want to donate organs when you die?  ?? Do you want certain religious practices performed before you die? If so, put your wishes in the advance directive.  ?? Read your advance directive every year, and make changes as needed.  When should you call for help?  Be sure to contact your doctor if you have any questions.  Where can you learn more?  Go to http://www.healthwise.net/GoodHelpConnections  Enter R264 in the search box to learn more about "Advance Directives: Care Instructions."  ?? 2006-2016 Healthwise, Incorporated. Care instructions adapted under license by [employer] (which disclaims liability or warranty for this information). This care instruction is for use with your licensed healthcare professional. If you have questions about a medical condition or this instruction, always ask your   healthcare professional. [place] any warranty or liability for your use of this information.  Content Version: 11.0.578772; Current as of: October 12, 2014        Well Visit, Ages 81 to 21: Care Instructions  Your Care Instructions  Physical exams can help you stay healthy. Your doctor has checked your overall health and may have suggested ways to take good care of yourself. He or she also may have recommended tests. At home, you can help prevent illness with healthy eating, regular exercise, and other steps.   Follow-up care is a key part of your treatment and safety. Be sure to make and go to all appointments, and call your doctor if you are having problems. It's also a good idea to know your test results and keep a list of the medicines you take.  How can you care for yourself at home?  ?? Reach and stay at a healthy weight. This will lower your risk for many problems, such as obesity, diabetes, heart disease, and high blood pressure.  ?? Get at least 30 minutes of physical activity on most days of the week. Walking is a good choice. You also may want to do other activities, such as running, swimming, cycling, or playing tennis or team sports. Discuss any changes in your exercise program with your doctor.  ?? Do not smoke or allow others to smoke around you. If you need help quitting, talk to your doctor about stop-smoking programs and medicines. These can increase your chances of quitting for good.  ?? Talk to your doctor about whether you have any risk factors for sexually transmitted infections (STIs). Having one sex partner (who does not have STIs and does not have sex with anyone else) is a good way to avoid these infections.  ?? Use birth control if you do not want to have children at this time. Talk with your doctor about the choices available and what might be best for you.  ?? Always wear sunscreen on exposed skin. Make sure the sunscreen blocks ultraviolet rays (both UVA and UVB) and has a sun protection factor (SPF) of at least 15. Use it every day, even when it is cloudy. Some doctors may recommend a higher SPF, such as 38.  ?? See a dentist one or two times a year for checkups and to have your teeth cleaned.  ?? Wear a seat belt in the car.  ?? Drink alcohol in moderation, if at all. That means no more than 2 drinks a day for men and 1 drink a day for women.  Follow your doctor's advice about when to have certain tests. These tests can spot problems early.  For everyone   ?? Cholesterol. Have the fat (cholesterol) in your blood tested after age 28. Your doctor will tell you how often to have this done based on your age, family history, or other things that can increase your risk for heart disease.  ?? Blood pressure. Have your blood pressure checked during a routine doctor visit. Your doctor will tell you how often to check your blood pressure based on your age, your blood pressure results, and other factors.  ?? Vision. Talk with your doctor about how often to have a glaucoma test.  ?? Diabetes. Ask your doctor whether you should have tests for diabetes.  ?? Colon cancer. Have a test for colon cancer at age 28. You may have one of several tests. If you are younger than 56, you may need a  test earlier if you have any risk factors. Risk factors include whether you already had a precancerous polyp removed from your colon or whether your parent, brother, sister, or child has had colon cancer.  For women  ?? Breast exam and mammogram. Talk to your doctor about when you should have a clinical breast exam and a mammogram. Medical experts differ on whether and how often women under 50 should have these tests. Your doctor can help you decide what is right for you.  ?? Pap test and pelvic exam. Begin Pap tests at age 61. A Pap test is the best way to find cervical cancer. The test often is part of a pelvic exam. Ask how often to have this test.  ?? Tests for sexually transmitted infections (STIs). Ask whether you should have tests for STIs. You may be at risk if you have sex with more than one person, especially if your partners do not wear condoms.  For men  ?? Tests for sexually transmitted infections (STIs). Ask whether you should have tests for STIs. You may be at risk if you have sex with more than one person, especially if you do not wear a condom.  ?? Testicular cancer exam. Ask your doctor whether you should check your testicles regularly.   ?? Prostate exam. Talk to your doctor about whether you should have a blood test (called a PSA test) for prostate cancer. Experts differ on whether and when men should have this test. Some experts suggest it if you are older than 29 and are African-American or have a father or brother who got prostate cancer when he was younger than 37.  When should you call for help?  Watch closely for changes in your health, and be sure to contact your doctor if you have any problems or symptoms that concern you.  Where can you learn more?  Go to StreetWrestling.at  Enter P072 in the search box to learn more about "Well Visit, Ages 2 to 27: Care Instructions."  ?? 2006-2016 Healthwise, Incorporated. Care instructions adapted under license by [employer] (which disclaims liability or warranty for this information). This care instruction is for use with your licensed healthcare professional. If you have questions about a medical condition or this instruction, always ask your healthcare professional. [place] any warranty or liability for your use of this information.  Content Version: 11.0.578772; Current as of: December 14, 2014

## 2015-05-17 NOTE — Progress Notes (Signed)
Subjective:   Melissa Leblanc is a 49 y.o. female with hypertension, depression & prediabetes    Doing well with current Rx and needs refills and labs checked for stable.  Depression doing well with trazodone uses most nights and pleased.    Hypertension ROS: taking medications as instructed, no medication side effects noted, no TIA's, no chest pain on exertion, no dyspnea on exertion, no swelling of ankles.     Other symptoms and concerns: none.  Past medications tried and failed none.    Current Outpatient Prescriptions   Medication Sig Dispense Refill   ??? oxyCODONE-acetaminophen (PERCOCET 10) 10-325 mg per tablet Take 1 Tab by mouth every six (6) hours as needed for Pain. Max Daily Amount: 4 Tabs. 120 Tab 0   ??? lisinopril-hydrochlorothiazide (PRINZIDE, ZESTORETIC) 20-25 mg per tablet take 1 tablet by mouth once daily 90 Tab 1   ??? warfarin (COUMADIN) 1 mg tablet TAKE 1 TABLET BY MOUTH EVERY DAY AT 6 PM OR AFTER 30 Tab 6   ??? cyclobenzaprine (FLEXERIL) 5 mg tablet Take 1 Tab by mouth three (3) times daily as needed for Muscle Spasm(s). 90 Tab 3   ??? fluticasone (FLONASE) 50 mcg/actuation nasal spray 2 Sprays by Both Nostrils route daily. 1 Bottle 3   ??? traZODone (DESYREL) 50 mg tablet Take 1 Tab by mouth nightly. 90 Tab 1      Patient Active Problem List   Diagnosis Code   ??? 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 Prediabetes R73.09   ??? HTN (hypertension) I10   ??? Urge incontinence N39.41   ??? Supraclavicular lymphadenopathy R59.0   ??? 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 Anemia D64.9   ??? Back pain M54.9   ??? Leukopenia D72.819   ??? Acute URI J06.9   ??? Bladder incontinence R32   ??? Depression F32.9   ??? Advance directive discussed with patient Z76.89     Family History   Problem Relation Age of Onset   ??? Cancer Mother      throat   ??? Breast Cancer Mother    ??? Colon Cancer Mother    ??? Hypertension Mother    ??? Colon Cancer Father    ??? Pacemaker Father    ??? Cancer Sister       thigh that moved to lung, sounds like myosarcoma?   ??? Hypertension Sister    ??? Alcohol abuse Neg Hx    ??? Arthritis-rheumatoid Neg Hx    ??? Asthma Neg Hx    ??? Bleeding Prob Neg Hx    ??? Diabetes Neg Hx    ??? Elevated Lipids Neg Hx    ??? Headache Neg Hx    ??? Lung Disease Neg Hx    ??? Migraines Neg Hx    ??? Psychiatric Disorder Neg Hx    ??? Stroke Neg Hx    ??? Mental Retardation Neg Hx      Lab Results   Component Value Date/Time    CHOLESTEROL, TOTAL 202 12/14/2013 09:42 AM    HDL CHOLESTEROL 63 12/14/2013 09:42 AM    LDL, CALCULATED 122 12/14/2013 09:42 AM    VLDL, CALCULATED 17 12/14/2013 09:42 AM    TRIGLYCERIDE 84 12/14/2013 09:42 AM     Lab Results   Component Value Date/Time    SODIUM 134 11/28/2014 09:37 AM    POTASSIUM 4.2 11/28/2014 09:37 AM    CHLORIDE 101 11/28/2014 09:37 AM  CO2 27 11/28/2014 09:37 AM    ANION GAP 6 11/28/2014 09:37 AM    GLUCOSE 80 11/28/2014 09:37 AM    BUN 11 11/28/2014 09:37 AM    CREATININE 0.96 11/28/2014 09:37 AM    BUN/CREATININE RATIO 11 11/28/2014 09:37 AM    GFR EST AA >60 11/28/2014 09:37 AM    GFR EST NON-AA >60 11/28/2014 09:37 AM    CALCIUM 9.5 11/28/2014 09:37 AM    BILIRUBIN, TOTAL 0.2 10/11/2014 10:35 AM    ALT 16 10/11/2014 10:35 AM    AST 14 10/11/2014 10:35 AM    ALK. PHOSPHATASE 60 10/11/2014 10:35 AM    PROTEIN, TOTAL 7.3 10/11/2014 10:35 AM    ALBUMIN 4.1 10/11/2014 10:35 AM    GLOBULIN 4.6 02/28/2013 04:11 PM    A-G RATIO 1.3 10/11/2014 10:35 AM     Lab Results   Component Value Date/Time    WBC 4.6 02/07/2015 10:01 AM    HGB (POC) 11.1 12/17/2012 11:51 AM    HGB 13.1 02/07/2015 10:01 AM    HCT (POC) 34.1 12/17/2012 11:51 AM    HCT 39.4 02/07/2015 10:01 AM    PLATELET 209 02/07/2015 10:01 AM    MCV 82.8 02/07/2015 10:01 AM     Wt Readings from Last 3 Encounters:   05/17/15 216 lb (98 kg)   04/20/15 213 lb (96.6 kg)   04/13/15 213 lb (96.6 kg)       Objective:   Visit Vitals   ??? BP 122/82 (BP 1 Location: Right arm, BP Patient Position: Sitting)   ??? Pulse 90    ??? Temp 98 ??F (36.7 ??C) (Oral)   ??? Resp 16   ??? Ht 5' 5"  (1.651 m)   ??? Wt 216 lb (98 kg)   ??? SpO2 97%   ??? BMI 35.94 kg/m2     GEN:  Appears stated age in NAD.  HEENT: Conjunctiva/lids normal.  External ears and nose without lesions/trauma.  Hearing Intact.  Tongue midline.  NECK: Trachea midline.  Supple.  Full ROM  CARDIAC:  regular rate and rhythm. no Murmur, no peripheral edema.  LUNGS: lungs clear to auscultation, no accessory muscle use.  MS: no clubbing/cyanosis.  SKIN: Warm/dry without rash.  PSYCH: Appropriate insight, Judgment.  A&O x 3.    Lab review: orders written for new lab studies as appropriate; see orders.     Assessment:    HTN  Pre-DM  MDD      Plan:   Refill Rx and will get labs when fasting and notify results.  See other note MWV.Marland Kitchen    Patient verbalized understanding of plan.

## 2015-05-17 NOTE — Progress Notes (Signed)
This is a Subsequent Medicare Annual Wellness Visit providing Personalized Prevention Plan Services (PPPS) (Performed 12 months after initial AWV and PPPS )    I have reviewed the patient's medical history in detail and updated the computerized patient record.     History     Past Medical History   Diagnosis Date   ??? Anxiety    ??? Asthma    ??? Bladder incontinence    ??? Cancer [facility])      lymph node cancer   ??? Depression 08/17/2014   ??? Depression 08/17/2014   ??? Diabetes ([place])      border line diabetes   ??? GERD (gastroesophageal reflux disease)    ??? Hypertension    ??? Lymphoma ([place])    ??? Neck mass    ??? Prediabetes    ??? VAIN I (vaginal intraepithelial neoplasia grade I)       Past Surgical History   Procedure Laterality Date   ??? Hx gyn       BTL   ??? Hx hysterectomy  2004   ??? Hx other surgical       lymph node biopsies neck   ??? Hx vascular access       double right chest mediport     Current Outpatient Prescriptions   Medication Sig Dispense Refill   ??? oxyCODONE-acetaminophen (PERCOCET 10) 10-325 mg per tablet Take 1 Tab by mouth every six (6) hours as needed for Pain. Max Daily Amount: 4 Tabs. 120 Tab 0   ??? lisinopril-hydrochlorothiazide (PRINZIDE, ZESTORETIC) 20-25 mg per tablet take 1 tablet by mouth once daily 90 Tab 1   ??? warfarin (COUMADIN) 1 mg tablet TAKE 1 TABLET BY MOUTH EVERY DAY AT 6 PM OR AFTER 30 Tab 6   ??? cyclobenzaprine (FLEXERIL) 5 mg tablet Take 1 Tab by mouth three (3) times daily as needed for Muscle Spasm(s). 90 Tab 3   ??? fluticasone (FLONASE) 50 mcg/actuation nasal spray 2 Sprays by Both Nostrils route daily. 1 Bottle 3   ??? traZODone (DESYREL) 50 mg tablet Take 1 Tab by mouth nightly. 90 Tab 1     No Known Allergies  Family History   Problem Relation Age of Onset   ??? Cancer Mother      throat   ??? Breast Cancer Mother    ??? Colon Cancer Mother    ??? Hypertension Mother    ??? Colon Cancer Father    ??? Pacemaker Father    ??? Cancer Sister      thigh that moved to lung, sounds like myosarcoma?    ??? Hypertension Sister    ??? Alcohol abuse Neg Hx    ??? Arthritis-rheumatoid Neg Hx    ??? Asthma Neg Hx    ??? Bleeding Prob Neg Hx    ??? Diabetes Neg Hx    ??? Elevated Lipids Neg Hx    ??? Headache Neg Hx    ??? Lung Disease Neg Hx    ??? Migraines Neg Hx    ??? Psychiatric Disorder Neg Hx    ??? Stroke Neg Hx    ??? Mental Retardation Neg Hx      Social History   Substance Use Topics   ??? Smoking status: Current Every Day Smoker     Packs/day: 0.50     Years: 28.00   ??? Smokeless tobacco: Never Used      Comment: Seeing Dr. Starleen Blue to help her   ??? Alcohol use 1.8 - 2.4 oz/week  3 - 4 Glasses of wine per week      Comment: every 3 to 4 days     Patient Active Problem List   Diagnosis Code   ??? 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 Prediabetes R73.09   ??? HTN (hypertension) I10   ??? Urge incontinence N39.41   ??? Supraclavicular lymphadenopathy R59.0   ??? Hodgkin's disease of head, face, and neck ([place]) C81.91   ??? Anemia D64.9   ??? Back pain M54.9   ??? Leukopenia D72.819   ??? Acute URI J06.9   ??? Bladder incontinence R32   ??? Depression F32.9   ??? Advance directive discussed with patient Z71.89       Depression Risk Factor Screening:     PHQ 2 / 9, over the last two weeks 04/15/2014   Little interest or pleasure in doing things Several days   Feeling down, depressed or hopeless Several days   Total Score PHQ 2 2     Alcohol Risk Factor Screening:   On any occasion during the past 3 months, have you had more than 3 drinks containing alcohol?  No    Do you average more than 7 drinks per week?  No      Functional Ability and Level of Safety:     Hearing Loss   none    Activities of Daily Living   Self-care.   Requires assistance with: no ADLs    Fall Risk   No flowsheet data found.  Abuse Screen   Patient is not abused    Review of Systems   A comprehensive review of systems was negative except for that written in the HPI.    Physical Examination     Evaluation of Cognitive Function:  Mood/affect:  happy   Appearance: age appropriate and casually dressed  Family member/caregiver input: n/a    Visit Vitals   ??? BP 122/82 (BP 1 Location: Right arm, BP Patient Position: Sitting)   ??? Pulse 90   ??? Temp 98 ??F (36.7 ??C) (Oral)   ??? Resp 16   ??? Ht 5\' 5"  (1.651 m)   ??? Wt 216 lb (98 kg)   ??? SpO2 97%   ??? BMI 35.94 kg/m2     Patient Care Team:  Zackery Barefoot, DO as PCP - General (Family Practice)  Lia Hopping, MD (General Surgery)  Suzy Bouchard, MD (Oncology)    Advice/Referrals/Counseling   Education and counseling provided:  Are appropriate based on today's review and evaluation    Assessment/Plan       ICD-10-CM ICD-9-CM    1. Routine general medical examination at a health care facility Z00.00 V70.0    2. Advance directive discussed with patient Z71.89 V65.49 ADVANCE CARE PLANNING FIRST 30 MINS   3. Screening for ischemic heart disease Z13.6 V81.0 LIPID PANEL      METABOLIC PANEL, COMPREHENSIVE   4. Prediabetes R73.09 790.29 LIPID PANEL      METABOLIC PANEL, COMPREHENSIVE     Advance Care Planning (ACP) Provider Conversation Snapshot    Date of ACP Conversation: 05/17/15  Persons included in Conversation:  patient  Length of ACP Conversation in minutes:  18 min    Authorized Media planner (if patient is incapable of making informed decisions):   This person is:   Environmental education officer under Advance Directive will be her son          For Patients with Decision Making Capacity:  Values/Goals: Exploration of values, goals, and preferences if recovery is not expected, even with continued medical treatment in the event of:  Imminent death  Severe, permanent brain injury    Conversation Outcomes / Follow-Up Plan:   Recommended completion of Advance Directive form after review of ACP materials and conversation with prospective healthcare agent   Recommended review of completed ACP document annually or upon change in health status

## 2015-05-19 ENCOUNTER — Encounter: Attending: Sports Medicine | Primary: Family

## 2015-05-22 ENCOUNTER — Encounter: Payer: MEDICARE | Primary: Family

## 2015-05-22 ENCOUNTER — Inpatient Hospital Stay: Payer: MEDICARE | Primary: Family

## 2015-05-22 DIAGNOSIS — Z452 Encounter for adjustment and management of vascular access device: Secondary | ICD-10-CM

## 2015-05-22 MED ORDER — SODIUM CHLORIDE 0.9 % IJ SYRG
INTRAMUSCULAR | Status: DC | PRN
Start: 2015-05-22 — End: 2015-06-22

## 2015-05-22 MED ORDER — HEPARIN LOCK FLUSH 100 UNIT/ML IV SOLN
100 unit/mL | INTRAVENOUS | Status: AC | PRN
Start: 2015-05-22 — End: 2015-05-23

## 2015-05-22 MED FILL — BD POSIFLUSH NORMAL SALINE 0.9 % INJECTION SYRINGE: INTRAMUSCULAR | Qty: 40

## 2015-05-23 ENCOUNTER — Inpatient Hospital Stay: Payer: MEDICARE | Primary: Family

## 2015-05-25 ENCOUNTER — Inpatient Hospital Stay: Admit: 2015-05-25 | Payer: MEDICARE | Primary: Family

## 2015-05-25 MED ORDER — HEPARIN LOCK FLUSH 100 UNIT/ML IV SOLN
100 unit/mL | INTRAVENOUS | Status: AC
Start: 2015-05-25 — End: 2015-05-25

## 2015-05-25 MED ORDER — WATER FOR INJECTION, STERILE INJECTION
2 mg | Freq: Once | INTRAMUSCULAR | Status: AC
Start: 2015-05-25 — End: 2015-05-25
  Administered 2015-05-25: 15:00:00

## 2015-05-25 MED ORDER — SODIUM CHLORIDE 0.9 % IJ SYRG
INTRAMUSCULAR | Status: DC | PRN
Start: 2015-05-25 — End: 2015-05-29
  Administered 2015-05-25 (×2): via INTRAVENOUS

## 2015-05-25 MED ORDER — HEPARIN LOCK FLUSH 100 UNIT/ML IV SOLN
100 unit/mL | INTRAVENOUS | Status: AC | PRN
Start: 2015-05-25 — End: 2015-05-26
  Administered 2015-05-25 (×2): via INTRAVENOUS

## 2015-05-25 MED FILL — HEPARIN LOCK FLUSH 100 UNIT/ML IV SOLN: 100 unit/mL | INTRAVENOUS | Qty: 10

## 2015-05-25 MED FILL — CATHFLO ACTIVASE 2 MG INTRA-CATHETER SOLUTION: 2 mg | Qty: 2

## 2015-05-25 MED FILL — HEPARIN LOCK FLUSH 100 UNIT/ML IV SOLN: 100 unit/mL | INTRAVENOUS | Qty: 5

## 2015-05-25 MED FILL — BD POSIFLUSH NORMAL SALINE 0.9 % INJECTION SYRINGE: INTRAMUSCULAR | Qty: 40

## 2015-05-25 NOTE — Progress Notes (Signed)
[facility] OPIC Progress Note    Date: May 25, 2015    Name: Melissa Leblanc    MRN: [id number]         DOB: 04-04-1966    Monthly Port flush    [name] to [place], ambulatory, at [phone]. Pt was assessed and education was provided. She denies any complaints.     [name] vitals were reviewed.  Visit Vitals   ??? BP (!) 139/94 (BP 1 Location: Left arm, BP Patient Position: Sitting)   ??? Pulse 86   ??? Temp 98.5 ??F (36.9 ??C)   ??? Resp 18   ??? SpO2 99%   ??? Breastfeeding No       Right chest double mediport was accessed with 20 gauge 1 huber(s) after chloroprep.    Port flushed easily and but no blood return.     Verbal orders received to instil cathflo 2mg  from [facility], NP>    Cathflo instilled in both lumens of mediport. Dwelled for 30 minutes and brisk blood return was obtained. 10 mL of blood wasted off each lumen.     Both lumens of mediport flushed with NS 20 ml and Heparin 500 units then de-accessed. No irritation or bleeding noted. Band-Aid applied.    [name] tolerated the procedure, and had no complaints. Patient armband removed and shredded.    [name] was discharged from [place] in stable condition at 1150. She is to return on 06/22/2015 at 0930 for her next appointment.    [name], RN  May 25, 2015

## 2015-05-30 MED ORDER — OXYCODONE-ACETAMINOPHEN 10 MG-325 MG TAB
10-325 mg | ORAL_TABLET | Freq: Four times a day (QID) | ORAL | 0 refills | Status: DC | PRN
Start: 2015-05-30 — End: 2015-06-13

## 2015-05-30 NOTE — Telephone Encounter (Signed)
Requesting refill on Percocet

## 2015-06-13 ENCOUNTER — Inpatient Hospital Stay: Admit: 2015-06-13 | Primary: Family

## 2015-06-13 ENCOUNTER — Ambulatory Visit: Admit: 2015-06-13 | Discharge: 2015-06-13 | Payer: MEDICARE | Attending: Hematology & Oncology | Primary: Family

## 2015-06-13 DIAGNOSIS — D72819 Decreased white blood cell count, unspecified: Secondary | ICD-10-CM

## 2015-06-13 LAB — CBC WITH 3 PART DIFF
ABS. LYMPHOCYTES: 2 10*3/uL (ref 1.1–5.9)
ABS. MIXED CELLS: 0.3 10*3/uL (ref 0.0–2.3)
ABS. NEUTROPHILS: 3.3 10*3/uL (ref 1.8–9.5)
HCT: 40.1 % (ref 36–48)
HGB: 12.8 g/dL (ref 12.0–16.0)
LYMPHOCYTES: 36 % (ref 14–44)
MCH: 25.9 PG (ref 25.0–35.0)
MCHC: 31.9 g/dL (ref 31–37)
MCV: 81 FL (ref 78–102)
Mixed cells: 6 % (ref 0.1–17)
NEUTROPHILS: 58 % (ref 40–70)
PLATELET: 246 10*3/uL (ref 140–440)
RBC: 4.95 M/uL (ref 4.10–5.10)
RDW: 17.2 % — ABNORMAL HIGH (ref 11.5–14.5)
WBC: 5.6 10*3/uL (ref 4.5–13.0)

## 2015-06-13 MED ORDER — OXYCODONE-ACETAMINOPHEN 10 MG-325 MG TAB
10-325 mg | ORAL_TABLET | Freq: Four times a day (QID) | ORAL | 0 refills | Status: DC | PRN
Start: 2015-06-13 — End: 2015-07-18

## 2015-06-13 NOTE — Progress Notes (Signed)
Hematology/Oncology  Progress Note    Name: Melissa Leblanc  Date: 06/13/2015  DOB: Nov 10, 1965    JKD:TOIZT M. Melissa Blue, DO     [name] is a 49 year old female who was seen for management of her non-Hodgkin's lymphoma.    Current therapy: active surveillance, the patient previously completed 8 cycles of systemic chemotherapy with the Rituxan and CHOP regimen (2014)    Subjective:     Melissa Leblanc is a 49 year old African American woman has a history of non-Hodgkin's lymphoma. She has previously completed a full course of systemic chemotherapy with a combination of Rituxan and CHOP. She denies fevers, night sweats. Her appetite is OK She reports having the left supraclavicular lymph node occasionally enlarging followed by regression and lymph nodes " bumps" in are axilla that come and go.   There are no other complaints.    Past medical history, family history, and social history: these were reviewed and remains unchanged.    Past Medical History   Diagnosis Date   ??? Anxiety    ??? Asthma    ??? Bladder incontinence    ??? Cancer [facility])      lymph node cancer   ??? Depression 08/17/2014   ??? Depression 08/17/2014   ??? Diabetes ([place])      border line diabetes   ??? GERD (gastroesophageal reflux disease)    ??? Hypertension    ??? Lymphoma (Melissa Leblanc)    ??? Neck mass    ??? Prediabetes    ??? VAIN I (vaginal intraepithelial neoplasia grade I)      Past Surgical History   Procedure Laterality Date   ??? Hx gyn       BTL   ??? Hx hysterectomy  2004   ??? Hx other surgical       lymph node biopsies neck   ??? Hx vascular access       double right chest mediport     Social History     Social History   ??? Marital status: SINGLE     Spouse name: N/A   ??? Number of children: N/A   ??? Years of education: N/A     Occupational History   ??? Not on file.     Social History Main Topics   ??? Smoking status: Current Every Day Smoker     Packs/day: 0.50     Years: 28.00   ??? Smokeless tobacco: Never Used      Comment: Seeing Dr. Starleen Leblanc to help her   ??? Alcohol use 1.8 - 2.4 oz/week      3 - 4 Glasses of wine per week      Comment: every 3 to 4 days   ??? Drug use: Yes     Special: Marijuana      Comment: one or 2 puffs occasionally   ??? Sexual activity: No     Other Topics Concern   ??? Not on file     Social History Narrative     Family History   Problem Relation Age of Onset   ??? Cancer Mother      throat   ??? Breast Cancer Mother    ??? Colon Cancer Mother    ??? Hypertension Mother    ??? Colon Cancer Father    ??? Pacemaker Father    ??? Cancer Sister      thigh that moved to lung, sounds like myosarcoma?   ??? Hypertension Sister    ??? Alcohol abuse Neg Hx    ???  Arthritis-rheumatoid Neg Hx    ??? Asthma Neg Hx    ??? Bleeding Prob Neg Hx    ??? Diabetes Neg Hx    ??? Elevated Lipids Neg Hx    ??? Headache Neg Hx    ??? Lung Disease Neg Hx    ??? Migraines Neg Hx    ??? Psychiatric Disorder Neg Hx    ??? Stroke Neg Hx    ??? Mental Retardation Neg Hx      Current Outpatient Prescriptions   Medication Sig Dispense Refill   ??? oxyCODONE-acetaminophen (PERCOCET 10) 10-325 mg per tablet Take 1 Tab by mouth every six (6) hours as needed for Pain. Max Daily Amount: 4 Tabs. 120 Tab 0   ??? traZODone (DESYREL) 50 mg tablet Take 1 Tab by mouth nightly. 90 Tab 1   ??? lisinopril-hydrochlorothiazide (PRINZIDE, ZESTORETIC) 20-25 mg per tablet take 1 tablet by mouth once daily 90 Tab 1   ??? warfarin (COUMADIN) 1 mg tablet TAKE 1 TABLET BY MOUTH EVERY DAY AT 6 PM OR AFTER 30 Tab 6   ??? cyclobenzaprine (FLEXERIL) 5 mg tablet Take 1 Tab by mouth three (3) times daily as needed for Muscle Spasm(s). 90 Tab 3   ??? fluticasone (FLONASE) 50 mcg/actuation nasal spray 2 Sprays by Both Nostrils route daily. 1 Bottle 3     Facility-Administered Medications Ordered in Other Visits   Medication Dose Route Frequency Provider Last Rate Last Dose   ??? sodium chloride (NS) flush 10-40 mL  10-40 mL IntraVENous PRN Eden Emms, MD           Review of Systems  Constitutional: The patient has no acute distress or discomfort.   HEENT: The patient denies recent head trauma, eye pain, blurred vision,  hearing deficit, oropharyngeal mucosal pain or lesions, and the patient denies throat pain or discomfort.  Lymphatics: The patient denies palpable peripheral lymphadenopathy.  Hematologic: The patient denies having bruising, bleeding, or progressive fatigue.  Respiratory: the patient is complaining of some cough, low-grade fever and running nose.  Cardiovascular: The patient denies having leg pain, leg swelling, heart palpitations, chest permit, chest pain, or lightheadedness.  The patient denies having dyspnea on exertion.  Gastrointestinal: The patient denies having nausea, emesis, or diarrhea. The patient denies having any hematemesis or blood in the stool.  Genitourinary: Patient denies having urinary urgency, frequency, or dysuria.  The patient denies having blood in the urine.  Psychological: The patient denies having symptoms of nervousness, anxiety, depression, or thoughts of harming herself  Or anybody .  Skin: Patient denies having skin rashes, skin, ulcerations, or unexplained itching or pruritus.  Musculoskeletal: The patient denies having pain in the joints or bones.      Objective:     Visit Vitals   ??? BP 144/85   ??? Pulse 77   ??? Temp 98.4 ??F (36.9 ??C)   ??? Ht 5\' 5"  (1.651 m)   ??? Wt 99.3 kg (219 lb)   ??? BMI 36.44 kg/m2     ECOG PS=0, pain score=4/10    Physical Exam:   Gen. Appearance: The patient is in no acute distress.  Skin: There is no bruise or rash.  HEENT: The exam is unremarkable.  Neck: Supple without lymphadenopathy or thyromegaly.  Lungs: Clear to auscultation and percussion; there are no wheezes or rhonchi.  Heart: Regular rate and rhythm; there are no murmurs, gallops, or rubs.  Abdomen: Bowel sounds are present and normal.  There is no guarding, tenderness,  or hepatosplenomegaly.  Extremities: There is no clubbing, cyanosis, or edema.  Neurologic: There are no focal neurologic deficits.  Lymphatics:  There is no palpable peripheral lymphadenopathy. Musculoskeletal: The patient has full range of motion at all joints.  There is no evidence of joint deformity or effusions.  There is no focal joint tenderness.  Psychological/psychiatric: There is no clinical evidence of anxiety, depression, or melancholy.    Lab data:      Results for orders placed or performed during the hospital encounter of 06/13/15   CBC WITH 3 PART DIFF     Status: Abnormal   Result Value Ref Range Status    WBC 5.6 4.5 - 13.0 K/uL Final    RBC 4.95 4.10 - 5.10 M/uL Final    HGB 12.8 12.0 - 16.0 g/dL Final    HCT 40.1 36 - 48 % Final    MCV 81.0 78 - 102 FL Final    MCH 25.9 25.0 - 35.0 PG Final    MCHC 31.9 31 - 37 g/dL Final    RDW 17.2 (H) 11.5 - 14.5 % Final    PLATELET 246 140 - 440 K/uL Final    NEUTROPHILS 58 40 - 70 % Final    MIXED CELLS 6 0.1 - 17 % Final    LYMPHOCYTES 36 14 - 44 % Final    ABS. NEUTROPHILS 3.3 1.8 - 9.5 K/UL Final    ABS. MIXED CELLS 0.3 0.0 - 2.3 K/uL Final    ABS. LYMPHOCYTES 2.0 1.1 - 5.9 K/UL Final     Comment: Test performed at [facility]. Results Reviewed by Medical Director.    DF AUTOMATED   Final           Assessment:     1. Leukopenia, unspecified type    2. Hodgkin's disease of head, face, and neck ([place])    3. Anemia, unspecified type      Plan:   Non-Hodgkin's lymphoma: On June 01, 2014 the patient had a CT scan of the chest, abdomen, and pelvis to rule out any evidence of recurrent lymphoma. The imaging studies did not reveal any evidence of lymphadenopathy or recurrent disease. The small axillary lymph nodes are also stable and there is no new lymphadenopathy. I will order a sed rate today. Follow-up will  Continue at 4 month intervals.  Since it has been 1 year since her last CT scan one will be ordered today.       Leukopenia: The current CBC shows her WBC today normal at 5.6 and Neutrophils is 58% with absolute Neutrophils of 3.3. No  Intervention is  warranted at this time.     Iron deficiency anemia: The patient has continued to take the over-the-counter iron supplement once daily. Her current hemoglobin is 12.8 g/dL with hematocrit of 40.1. I recommended that she continue to take the iron supplement once daily.    I will have the patient in clinic for reassessment in 4 months or sooner if indicated.      Orders Placed This Encounter   ??? COMPLETE CBC & AUTO DIFF WBC   ??? CT CHEST ABD PELV W WO CONT     Standing Status:   Future     Standing Expiration Date:   07/13/2016     Order Specific Question:   Reason for Exam     Answer:   history of NHL     Order Specific Question:   Is Patient Allergic to Contrast  Dye?     Answer:   No     Order Specific Question:   Is Patient Pregnant?     Answer:   No   ??? InHouse CBC ([facility])     Standing Status:   Future     Number of Occurrences:   1     Standing Expiration Date:   03/19/4480   ??? METABOLIC PANEL, COMPREHENSIVE     Standing Status:   Future     Standing Expiration Date:   06/13/2016   ??? IRON PROFILE     Standing Status:   Future     Standing Expiration Date:   06/13/2016   ??? FERRITIN     Standing Status:   Future     Standing Expiration Date:   06/13/2016       Ronald Pippins, NP  06/13/2015

## 2015-06-14 LAB — METABOLIC PANEL, COMPREHENSIVE
A-G Ratio: 1.3 (ref 1.1–2.5)
ALT (SGPT): 13 IU/L (ref 0–32)
AST (SGOT): 16 IU/L (ref 0–40)
Albumin: 4.2 g/dL (ref 3.5–5.5)
Alk. phosphatase: 74 IU/L (ref 39–117)
BUN/Creatinine ratio: 9 (ref 9–23)
BUN: 6 mg/dL (ref 6–24)
Bilirubin, total: <0.2 mg/dL (ref 0.0–1.2)
CO2: 22 mmol/L (ref 18–29)
Calcium: 9.6 mg/dL (ref 8.7–10.2)
Chloride: 105 mmol/L (ref 97–106)
Creatinine: 0.7 mg/dL (ref 0.57–1.00)
GFR est AA: 118 mL/min/{1.73_m2} (ref >59)
GFR est non-AA: 103 mL/min/{1.73_m2} (ref >59)
GLOBULIN, TOTAL: 3.3 g/dL (ref 1.5–4.5)
Glucose: 99 mg/dL (ref 65–99)
Potassium: 4.4 mmol/L (ref 3.5–5.2)
Protein, total: 7.5 g/dL (ref 6.0–8.5)
Sodium: 143 mmol/L (ref 136–144)

## 2015-06-14 LAB — IRON PROFILE
Iron % saturation: 19 % (ref 15–55)
Iron: 49 ug/dL (ref 27–159)
TIBC: 260 ug/dL (ref 250–450)
UIBC: 211 ug/dL (ref 131–425)

## 2015-06-14 LAB — FERRITIN: Ferritin: 153 ng/mL — ABNORMAL HIGH (ref 15–150)

## 2015-06-19 ENCOUNTER — Encounter: Payer: MEDICARE | Primary: Family

## 2015-06-22 ENCOUNTER — Encounter: Payer: MEDICARE | Primary: Family

## 2015-06-22 ENCOUNTER — Encounter

## 2015-06-22 NOTE — Telephone Encounter (Signed)
Clarene Critchley @ [facility] called, said this patient's health insurance co ([employer]) is requesting a Peer to Peer on Ms. Stauffer.  She said please call a company called [employer] @ [phone], they will ask for the insurance ID, & dob.  Then let them know you are calling back for a peer to peer.  She said the patient is scheduled for a procedure on 06/26/15 and so this phone call needs to be made today or tomorrow.  Clarene Critchley said she can be reached at [phone].

## 2015-06-22 NOTE — Telephone Encounter (Signed)
Called patient awaiting correct number for theresa.

## 2015-06-26 ENCOUNTER — Inpatient Hospital Stay: Admit: 2015-06-26 | Payer: MEDICARE | Attending: Obstetrics & Gynecology | Primary: Family

## 2015-06-26 ENCOUNTER — Encounter

## 2015-06-26 DIAGNOSIS — C8191 Hodgkin lymphoma, unspecified, lymph nodes of head, face, and neck: Secondary | ICD-10-CM

## 2015-06-26 LAB — CREATININE, POC
Creatinine, POC: 0.8 MG/DL (ref 0.6–1.3)
GFRAA, POC: >60 mL/min/{1.73_m2} (ref >60)
GFRNA, POC: >60 mL/min/{1.73_m2} (ref >60)

## 2015-06-26 MED ORDER — IOPAMIDOL 61 % IV SOLN
300 mg iodine /mL (61 %) | Freq: Once | INTRAVENOUS | Status: AC
Start: 2015-06-26 — End: 2015-06-26
  Administered 2015-06-26: 13:00:00 via INTRAVENOUS

## 2015-06-26 MED FILL — ISOVUE-300  61 % INTRAVENOUS SOLUTION: 300 mg iodine /mL (61 %) | INTRAVENOUS | Qty: 100

## 2015-06-26 NOTE — Telephone Encounter (Signed)
Dr Nadyne Coombes stated he would take care of this issue.

## 2015-06-27 ENCOUNTER — Inpatient Hospital Stay: Admit: 2015-06-27 | Payer: MEDICARE | Primary: Family

## 2015-06-27 DIAGNOSIS — C8590 Non-Hodgkin lymphoma, unspecified, unspecified site: Secondary | ICD-10-CM

## 2015-06-27 MED ORDER — SODIUM CHLORIDE 0.9 % IJ SYRG
INTRAMUSCULAR | Status: DC | PRN
Start: 2015-06-27 — End: 2015-07-01
  Administered 2015-06-27: 15:00:00 via INTRAVENOUS

## 2015-06-27 MED ORDER — HEPARIN LOCK FLUSH 100 UNIT/ML IV SOLN
100 unit/mL | INTRAVENOUS | Status: AC | PRN
Start: 2015-06-27 — End: 2015-06-28
  Administered 2015-06-27: 15:00:00 via INTRAVENOUS

## 2015-06-27 MED FILL — BD POSIFLUSH NORMAL SALINE 0.9 % INJECTION SYRINGE: INTRAMUSCULAR | Qty: 40

## 2015-06-27 NOTE — Progress Notes (Signed)
[facility] OPIC Progress Note    Date: June 27, 2015    Name: Melissa Leblanc    MRN: [id number]         DOB: 1965/11/29      [name] arrived to [facility] at 0930.    [name] was assessed and education was provided.     [name]'s vitals were reviewed.  Visit Vitals   ??? BP 163/78 (BP 1 Location: Left arm, BP Patient Position: Sitting)   ??? Pulse 92   ??? Temp 98.3 ??F (36.8 ??C)   ??? Resp 18   ??? SpO2 98%   ??? Breastfeeding No       Pt was observed for 5 minutes after obtaining vital signs prior to initiating treatment.    Medial lumen of patient's upper right double lumen chest port accessed.  No blood return noted,  flush without difficulty.  Patient states she can smell/taste the saline.      Patient anxious about needle and stated she does not want second (lateral) lumen accessed today.      Patient did not want to attempt cathflo at this visit for the medial lumen.    Medial lumen flushed and heparinized per protocol, then deaccessed.  Band-aid applied to site.    [name] tolerated well without complaints.    [name] was discharged from [place] in stable condition at 1000.  She is to return to [facility] on 07/25/15 at 1000 for her next port flush/cathflo  appointment.    Pleas Patricia Maerker, RN  June 27, 2015

## 2015-07-03 ENCOUNTER — Inpatient Hospital Stay: Payer: MEDICARE | Attending: Hematology & Oncology | Primary: Family

## 2015-07-18 MED ORDER — OXYCODONE-ACETAMINOPHEN 10 MG-325 MG TAB
10-325 mg | ORAL_TABLET | Freq: Four times a day (QID) | ORAL | 0 refills | Status: DC | PRN
Start: 2015-07-18 — End: 2015-08-15

## 2015-07-25 ENCOUNTER — Inpatient Hospital Stay: Admit: 2015-07-25 | Payer: MEDICARE | Primary: Family

## 2015-07-25 DIAGNOSIS — C8590 Non-Hodgkin lymphoma, unspecified, unspecified site: Secondary | ICD-10-CM

## 2015-07-25 MED ORDER — SODIUM CHLORIDE 0.9 % IJ SYRG
INTRAMUSCULAR | Status: DC | PRN
Start: 2015-07-25 — End: 2015-07-29
  Administered 2015-07-25: 15:00:00 via INTRAVENOUS

## 2015-07-25 MED ORDER — HEPARIN LOCK FLUSH 100 UNIT/ML IV SOLN
100 unit/mL | INTRAVENOUS | Status: AC
Start: 2015-07-25 — End: 2015-07-25

## 2015-07-25 MED ORDER — HEPARIN LOCK FLUSH 100 UNIT/ML IV SOLN
100 unit/mL | INTRAVENOUS | Status: AC | PRN
Start: 2015-07-25 — End: 2015-07-26
  Administered 2015-07-25: 15:00:00 via INTRAVENOUS

## 2015-07-25 MED FILL — HEPARIN LOCK FLUSH 100 UNIT/ML IV SOLN: 100 unit/mL | INTRAVENOUS | Qty: 10

## 2015-07-25 MED FILL — BD POSIFLUSH NORMAL SALINE 0.9 % INJECTION SYRINGE: INTRAMUSCULAR | Qty: 40

## 2015-07-25 MED FILL — HEPARIN LOCK FLUSH 100 UNIT/ML IV SOLN: 100 unit/mL | INTRAVENOUS | Qty: 5

## 2015-07-25 NOTE — Progress Notes (Signed)
[facility] OPIC Progress Note    Date: July 25, 2015    Name: Melissa Leblanc    MRN: [id number]         DOB: 11-24-1965    Monthly Port flush    [name] to [place], ambulatory, at [phone]. Pt was assessed and education was provided. Denies any complaints.     [name]'s vitals were reviewed.  Visit Vitals   ??? BP 127/84 (BP 1 Location: Left arm, BP Patient Position: Sitting)   ??? Pulse 86   ??? Temp 98.3 ??F (36.8 ??C)   ??? Resp 18   ??? SpO2 95%   ??? Breastfeeding No       Right lateral chest mediport was accessed with 20 gauge 1 inch huber(s) after chloroprep.    Port flushed easily and had brisk blood return. Mediport flushed with NS 20 ml and Heparin 500 units then de-accessed.     No irritation or bleeding noted. Band-Aid applied.    Right medial chest mediport was accessed, flushed without difficulty but no blood return was obtained. Pt states she did not want to do cathflo today.  Flushed with 20 mL NS and 500 units of heparin. Gauze and tegaderm applied.     [name] tolerated the procedure, and had no complaints. Patient armband removed and shredded.    [name] was discharged from [place] in stable condition at 1040. She is to return on 08/21/2014 at 1000 for her next appointment.    [name], RN  July 25, 2015

## 2015-08-15 MED ORDER — OXYCODONE-ACETAMINOPHEN 10 MG-325 MG TAB
10-325 mg | ORAL_TABLET | Freq: Four times a day (QID) | ORAL | 0 refills | Status: DC | PRN
Start: 2015-08-15 — End: 2015-09-14

## 2015-08-22 ENCOUNTER — Inpatient Hospital Stay: Admit: 2015-08-22 | Payer: MEDICARE | Primary: Family

## 2015-08-22 DIAGNOSIS — Z452 Encounter for adjustment and management of vascular access device: Secondary | ICD-10-CM

## 2015-08-22 MED ORDER — HEPARIN LOCK FLUSH 100 UNIT/ML IV SOLN
100 unit/mL | INTRAVENOUS | Status: AC
Start: 2015-08-22 — End: 2015-08-22
  Administered 2015-08-22: 16:00:00

## 2015-08-22 MED ORDER — HEPARIN LOCK FLUSH 100 UNIT/ML IV SOLN
100 unit/mL | INTRAVENOUS | Status: DC | PRN
Start: 2015-08-22 — End: 2015-08-26
  Administered 2015-08-22: 16:00:00

## 2015-08-22 MED ORDER — SODIUM CHLORIDE 0.9 % IJ SYRG
INTRAMUSCULAR | Status: AC | PRN
Start: 2015-08-22 — End: 2015-08-22
  Administered 2015-08-22 (×2): via INTRAVENOUS

## 2015-08-22 MED FILL — BD POSIFLUSH NORMAL SALINE 0.9 % INJECTION SYRINGE: INTRAMUSCULAR | Qty: 40

## 2015-08-22 MED FILL — HEPARIN LOCK FLUSH 100 UNIT/ML IV SOLN: 100 unit/mL | INTRAVENOUS | Qty: 10

## 2015-08-22 NOTE — Progress Notes (Signed)
[facility] OPIC Progress Note    Date: August 22, 2015    Name: Melissa Leblanc    MRN: [id number]         DOB: [birth year]    Monthly Port flush     [name] was assessed and education was provided.     [name]'s vitals were reviewed and patient was observed for 5 minutes prior to procedure.  Visit Vitals   ??? BP 119/86 (BP 1 Location: Left arm, BP Patient Position: At rest)   ??? Pulse 89   ??? Temp 98.4 ??F (36.9 ??C)   ??? Resp 18   ??? SpO2 96%   ??? Breastfeeding No       Mediports were accessed    Side:rt chest,/double    Blood return: YES to both ports    Flushed 10 ml of NS to each port, followed by Heparin 500 units to each port     [place] needles removed. bandaids applied to sites.sites s s/s.    [name] tolerated the procedure, and had no complaints.    [name] was discharged from [place] in stable condition at 1045. She is to return on 09/19/15 at 1000 am for her next appointment.    Adonis Housekeeper, RN  August 22, 2015  3:33 PM

## 2015-09-14 MED ORDER — OXYCODONE-ACETAMINOPHEN 10 MG-325 MG TAB
10-325 mg | ORAL_TABLET | Freq: Four times a day (QID) | ORAL | 0 refills | Status: DC | PRN
Start: 2015-09-14 — End: 2015-10-17

## 2015-09-14 NOTE — Telephone Encounter (Signed)
Patient also requested cough syrup, Guaifenesin AC. I do not see on her current med list.

## 2015-09-19 ENCOUNTER — Inpatient Hospital Stay: Admit: 2015-09-19 | Payer: MEDICARE | Primary: Family

## 2015-09-19 MED ORDER — HEPARIN LOCK FLUSH 100 UNIT/ML IV SOLN
100 unit/mL | INTRAVENOUS | Status: AC
Start: 2015-09-19 — End: 2015-09-19
  Administered 2015-09-19: 16:00:00

## 2015-09-19 MED ORDER — SODIUM CHLORIDE 0.9 % IJ SYRG
INTRAMUSCULAR | Status: DC | PRN
Start: 2015-09-19 — End: 2015-09-23
  Administered 2015-09-19: 16:00:00 via INTRAVENOUS

## 2015-09-19 MED ORDER — HEPARIN LOCK FLUSH 100 UNIT/ML IV SOLN
100 unit/mL | INTRAVENOUS | Status: AC | PRN
Start: 2015-09-19 — End: 2015-09-20
  Administered 2015-09-19: 16:00:00 via INTRAVENOUS

## 2015-09-19 MED FILL — HEPARIN LOCK FLUSH 100 UNIT/ML IV SOLN: 100 unit/mL | INTRAVENOUS | Qty: 10

## 2015-09-19 MED FILL — BD POSIFLUSH NORMAL SALINE 0.9 % INJECTION SYRINGE: INTRAMUSCULAR | Qty: 40

## 2015-09-19 NOTE — Progress Notes (Addendum)
[facility] OPIC Progress Note    Date: September 19, 2015    Name: Melissa Leblanc    MRN: [id number]         DOB: 04-03-1966    Monthly Port flush    [name] to [place], ambulatory, at [phone]. Pt was assessed and education was provided. Denies any complaints.     [name]'s vitals were reviewed.  Visit Vitals   ??? BP 137/84 (BP 1 Location: Left arm, BP Patient Position: At rest)   ??? Pulse 79   ??? Temp 98 ??F (36.7 ??C)   ??? Resp 18   ??? SpO2 95%       Right lateral chest mediport was accessed with 20 gauge 1 inch huber(s) after chloroprep.    Port flushed easily and had brisk blood return. Mediport flushed with NS 20 ml and Heparin 500 units then de-accessed.     No irritation or bleeding noted. Band-Aid applied.    Right medial chest mediport was accessed, Port flushed easily and had brisk blood return. Mediport flushed with NS 20 ml and Heparin 500 units then de-accessed.     [name] tolerated the procedure, and had no complaints. Patient armband removed and shredded.    [name] was discharged from [place] in stable condition at 1050. She is to return on 10/17/2015 at 0900 for her next appointment.    Renelda Mom, RN  September 19, 2015

## 2015-09-20 MED ORDER — WARFARIN 1 MG TAB
1 mg | ORAL_TABLET | ORAL | 0 refills | Status: DC
Start: 2015-09-20 — End: 2015-10-21

## 2015-10-17 ENCOUNTER — Ambulatory Visit: Admit: 2015-10-17 | Discharge: 2015-10-17 | Payer: MEDICARE | Attending: Hematology & Oncology | Primary: Family

## 2015-10-17 ENCOUNTER — Inpatient Hospital Stay: Admit: 2015-10-17 | Primary: Family

## 2015-10-17 ENCOUNTER — Inpatient Hospital Stay: Admit: 2015-10-17 | Payer: MEDICARE | Primary: Family

## 2015-10-17 DIAGNOSIS — C8191 Hodgkin lymphoma, unspecified, lymph nodes of head, face, and neck: Secondary | ICD-10-CM

## 2015-10-17 DIAGNOSIS — C8590 Non-Hodgkin lymphoma, unspecified, unspecified site: Secondary | ICD-10-CM

## 2015-10-17 DIAGNOSIS — D72819 Decreased white blood cell count, unspecified: Secondary | ICD-10-CM

## 2015-10-17 DIAGNOSIS — M1990 Unspecified osteoarthritis, unspecified site: Secondary | ICD-10-CM | POA: Insufficient documentation

## 2015-10-17 DIAGNOSIS — D649 Anemia, unspecified: Secondary | ICD-10-CM | POA: Insufficient documentation

## 2015-10-17 LAB — CBC WITH 3 PART DIFF
ABS. LYMPHOCYTES: 2.2 10*3/uL (ref 1.1–5.9)
ABS. MIXED CELLS: 0.2 10*3/uL (ref 0.0–2.3)
ABS. NEUTROPHILS: 2.8 10*3/uL (ref 1.8–9.5)
HCT: 38.3 % (ref 36–48)
HGB: 12.5 g/dL (ref 12.0–16.0)
LYMPHOCYTES: 43 % (ref 14–44)
MCH: 25.4 PG (ref 25.0–35.0)
MCHC: 32.6 g/dL (ref 31–37)
MCV: 77.7 FL — ABNORMAL LOW (ref 78–102)
Mixed cells: 3 % (ref 0.1–17)
NEUTROPHILS: 54 % (ref 40–70)
PLATELET: 251 10*3/uL (ref 140–440)
RBC: 4.93 M/uL (ref 4.10–5.10)
RDW: 16.8 % — ABNORMAL HIGH (ref 11.5–14.5)
WBC: 5.2 10*3/uL (ref 4.5–13.0)

## 2015-10-17 MED ORDER — SODIUM CHLORIDE 0.9 % IJ SYRG
INTRAMUSCULAR | Status: AC | PRN
Start: 2015-10-17 — End: 2015-10-17
  Administered 2015-10-17: 15:00:00 via INTRAVENOUS

## 2015-10-17 MED ORDER — HEPARIN LOCK FLUSH 100 UNIT/ML IV SOLN
100 unit/mL | INTRAVENOUS | Status: AC
Start: 2015-10-17 — End: 2015-10-17
  Administered 2015-10-17: 15:00:00

## 2015-10-17 MED ORDER — HEPARIN LOCK FLUSH 100 UNIT/ML IV SOLN
100 unit/mL | INTRAVENOUS | Status: AC | PRN
Start: 2015-10-17 — End: 2015-10-18

## 2015-10-17 MED ORDER — LIDOCAINE-PRILOCAINE 2.5 %-2.5 % TOPICAL CREAM
CUTANEOUS | 3 refills | Status: DC | PRN
Start: 2015-10-17 — End: 2016-03-11

## 2015-10-17 MED ORDER — OXYCODONE-ACETAMINOPHEN 10 MG-325 MG TAB
10-325 mg | ORAL_TABLET | Freq: Four times a day (QID) | ORAL | 0 refills | Status: DC | PRN
Start: 2015-10-17 — End: 2015-11-14

## 2015-10-17 MED FILL — HEPARIN LOCK FLUSH 100 UNIT/ML IV SOLN: 100 unit/mL | INTRAVENOUS | Qty: 10

## 2015-10-17 MED FILL — BD POSIFLUSH NORMAL SALINE 0.9 % INJECTION SYRINGE: INTRAMUSCULAR | Qty: 40

## 2015-10-17 NOTE — Progress Notes (Signed)
Hematology/Oncology  Progress Note    Name: Melissa Leblanc  Date: 10/17/2015  DOB: 1965/09/18    ZCH:YIFOY M. Melissa Blue, DO     Melissa Leblanc is a 50 year old female who was seen for management of her Hodgkin's lymphoma.    Current therapy: active surveillance, the patient previously completed 8 cycles of systemic chemotherapy with the ABVD regimen  Subjective:     Melissa Leblanc is a 50 year old African American woman has a history of Hodgkin's lymphoma. She has previously completed a full course of systemic chemotherapy with a combination of ABVD. She denies fevers, night sweats. Her appetite is OK She reports having the left supraclavicular lymph node occasionally enlarging followed by regression and lymph nodes " bumps" in are axilla that come and go.  The patient is complaining of some arthritic discomfort in her knees.    Past medical history, family history, and social history: these were reviewed and remains unchanged.    Past Medical History:   Diagnosis Date   ??? Anxiety    ??? Arthritis 10/17/2015   ??? Asthma    ??? Bladder incontinence    ??? Cancer [facility])     lymph node cancer   ??? Depression 08/17/2014   ??? Depression 08/17/2014   ??? Diabetes ([place])     border line diabetes   ??? GERD (gastroesophageal reflux disease)    ??? Hypertension    ??? Lymphoma ([place])    ??? Neck mass    ??? Prediabetes    ??? VAIN I (vaginal intraepithelial neoplasia grade I)      Past Surgical History:   Procedure Laterality Date   ??? HX GYN      BTL   ??? HX HYSTERECTOMY  2004   ??? HX OTHER SURGICAL      lymph node biopsies neck   ??? HX VASCULAR ACCESS      double right chest mediport     Social History     Social History   ??? Marital status: SINGLE     Spouse name: N/A   ??? Number of children: N/A   ??? Years of education: N/A     Occupational History   ??? Not on file.     Social History Main Topics   ??? Smoking status: Current Every Day Smoker     Packs/day: 0.50     Years: 28.00   ??? Smokeless tobacco: Never Used      Comment: Seeing Dr. Starleen Leblanc to help her    ??? Alcohol use 1.8 - 2.4 oz/week     3 - 4 Glasses of wine per week      Comment: every 3 to 4 days   ??? Drug use: Yes     Special: Marijuana      Comment: one or 2 puffs occasionally   ??? Sexual activity: No     Other Topics Concern   ??? Not on file     Social History Narrative     Family History   Problem Relation Age of Onset   ??? Cancer Mother      throat   ??? Breast Cancer Mother    ??? Colon Cancer Mother    ??? Hypertension Mother    ??? Colon Cancer Father    ??? Pacemaker Father    ??? Cancer Sister      thigh that moved to lung, sounds like myosarcoma?   ??? Hypertension Sister    ??? Alcohol abuse Neg Hx    ???  Arthritis-rheumatoid Neg Hx    ??? Asthma Neg Hx    ??? Bleeding Prob Neg Hx    ??? Diabetes Neg Hx    ??? Elevated Lipids Neg Hx    ??? Headache Neg Hx    ??? Lung Disease Neg Hx    ??? Migraines Neg Hx    ??? Psychiatric Disorder Neg Hx    ??? Stroke Neg Hx    ??? Mental Retardation Neg Hx      Current Outpatient Prescriptions   Medication Sig Dispense Refill   ??? warfarin (COUMADIN) 1 mg tablet take 1 tablet by mouth once daily AT 6PM OR AFTER 30 Tab 0   ??? oxyCODONE-acetaminophen (PERCOCET 10) 10-325 mg per tablet Take 1 Tab by mouth every six (6) hours as needed for Pain. Max Daily Amount: 4 Tabs. 120 Tab 0   ??? traZODone (DESYREL) 50 mg tablet Take 1 Tab by mouth nightly. 90 Tab 1   ??? lisinopril-hydrochlorothiazide (PRINZIDE, ZESTORETIC) 20-25 mg per tablet take 1 tablet by mouth once daily 90 Tab 1   ??? warfarin (COUMADIN) 1 mg tablet TAKE 1 TABLET BY MOUTH EVERY DAY AT 6 PM OR AFTER 30 Tab 6   ??? cyclobenzaprine (FLEXERIL) 5 mg tablet Take 1 Tab by mouth three (3) times daily as needed for Muscle Spasm(s). 90 Tab 3   ??? fluticasone (FLONASE) 50 mcg/actuation nasal spray 2 Sprays by Both Nostrils route daily. 1 Bottle 3     Facility-Administered Medications Ordered in Other Visits   Medication Dose Route Frequency Provider Last Rate Last Dose   ??? heparin (porcine) 100 unit/mL injection 500 Units  500 Units IntraVENous  PRN Suzy Bouchard, MD       ??? sodium chloride (NS) flush 10-40 mL  10-40 mL IntraVENous PRN Suzy Bouchard, MD   40 mL at 10/17/15 2025       Review of Systems  Constitutional: The patient has no acute distress or discomfort.  HEENT: The patient denies recent head trauma, eye pain, blurred vision,  hearing deficit, oropharyngeal mucosal pain or lesions, and the patient denies throat pain or discomfort.  Lymphatics: The patient denies palpable peripheral lymphadenopathy.  Hematologic: The patient denies having bruising, bleeding, or progressive fatigue.  Respiratory: the patient is complaining of some cough, low-grade fever and running nose.  Cardiovascular: The patient denies having leg pain, leg swelling, heart palpitations, chest permit, chest pain, or lightheadedness.  The patient denies having dyspnea on exertion.  Gastrointestinal: The patient denies having nausea, emesis, or diarrhea. The patient denies having any hematemesis or blood in the stool.  Genitourinary: Patient denies having urinary urgency, frequency, or dysuria.  The patient denies having blood in the urine.  Psychological: The patient denies having symptoms of nervousness, anxiety, depression, or thoughts of harming herself  Or anybody .  Skin: Patient denies having skin rashes, skin, ulcerations, or unexplained itching or pruritus.  Musculoskeletal: The patient denies having pain in the joints or bones.      Objective:     Visit Vitals   ??? BP 123/86   ??? Pulse 76   ??? Temp 98 ??F (36.7 ??C)   ??? Ht 5' 5"  (1.651 m)   ??? Wt 98.9 kg (218 lb)   ??? BMI 36.28 kg/m2     ECOG PS=0, pain score=4/10    Physical Exam:   Gen. Appearance: The patient is in no acute distress.  Skin: There is no bruise or rash.  HEENT: The exam is unremarkable.  Neck: Supple without lymphadenopathy or thyromegaly.  Lungs: Clear to auscultation and percussion; there are no wheezes or rhonchi.  Heart: Regular rate and  rhythm; there are no murmurs, gallops, or rubs.  Abdomen: Bowel sounds are present and normal.  There is no guarding, tenderness, or hepatosplenomegaly.  Extremities: There is no clubbing, cyanosis, or edema.  Neurologic: There are no focal neurologic deficits.  Lymphatics: There is no palpable peripheral lymphadenopathy. Musculoskeletal: The patient has full range of motion at all joints.  There is no evidence of joint deformity or effusions.  There is no focal joint tenderness.  Psychological/psychiatric: There is no clinical evidence of anxiety, depression, or melancholy.    Lab data:      Results for orders placed or performed during the hospital encounter of 10/17/15   CBC WITH 3 PART DIFF     Status: Abnormal   Result Value Ref Range Status    WBC 5.2 4.5 - 13.0 K/uL Final    RBC 4.93 4.10 - 5.10 M/uL Final    HGB 12.5 12.0 - 16.0 g/dL Final    HCT 38.3 36 - 48 % Final    MCV 77.7 (L) 78 - 102 FL Final    MCH 25.4 25.0 - 35.0 PG Final    MCHC 32.6 31 - 37 g/dL Final    RDW 16.8 (H) 11.5 - 14.5 % Final    PLATELET 251 140 - 440 K/uL Final    NEUTROPHILS 54 40 - 70 % Final    MIXED CELLS 3 0.1 - 17 % Final    LYMPHOCYTES 43 14 - 44 % Final    ABS. NEUTROPHILS 2.8 1.8 - 9.5 K/UL Final    ABS. MIXED CELLS 0.2 0.0 - 2.3 K/uL Final    ABS. LYMPHOCYTES 2.2 1.1 - 5.9 K/UL Final     Comment: Test performed at [facility]. Results Reviewed by Medical Director.    DF AUTOMATED   Final           Assessment:     1. Hodgkin's disease of head, face, and neck (HCC)    2. Leukopenia, unspecified type    3. Chronic anemia    4. Arthritis      Plan:   Hodgkin's lymphoma: The most recent CT scans showed no evidence of recurrent disease.  At this time I will check the sedimentation rate and compressive metabolic panel.  The patient has no clinical symptoms of recurrent Hodgkin's lymphoma.    Leukopenia: The current CBC shows her WBC today normal at 5.2 with an  absolute neutrophil count of 2.8 and absolute lymphocyte count of 2.2.  I have explained to the patient that her leukopenia has completely resolved.    Iron deficiency anemia: The patient has continued to take the over-the-counter iron supplement once daily. Her current hemoglobin is 12.5 g/dL with hematocrit of 38.3%.  I recommended that she continue to take the over-the-counter iron supplement, ferrous sulfate 1 tablet daily.    Chronic arthritis (new problem): I have recommended that the patient began taking Aleve 1 tablet twice daily.   I will renew her prescription for Percocet at this time.    I will have the patient in clinic for reassessment in 4 months or sooner if indicated.      Orders Placed This Encounter   ??? COMPLETE CBC & AUTO DIFF WBC   ??? InHouse CBC ([facility])     Standing Status:   Future  Number of Occurrences:   1     Standing Expiration Date:   10/24/2015   ??? FERRITIN     Standing Status:   Future     Standing Expiration Date:   11/19/3293   ??? METABOLIC PANEL, COMPREHENSIVE     Standing Status:   Future     Standing Expiration Date:   10/17/2016   ??? SED RATE (ESR)     Standing Status:   Future     Standing Expiration Date:   10/17/2016   ??? IRON PROFILE     Standing Status:   Future     Standing Expiration Date:   10/17/2016       Suzy Bouchard, MD  10/17/2015

## 2015-10-17 NOTE — Progress Notes (Signed)
[facility] OPIC Progress Note    Date: October 17, 2015    Name: Melissa Leblanc    MRN: [id number]         DOB: 12-Oct-1965    Monthly Port flush    Melissa Leblanc to [place], ambulatory, at [phone]. Pt was assessed and education was provided. Denies any complaints.     Melissa Leblanc's vitals were reviewed.  Visit Vitals   ??? BP 123/86 (BP 1 Location: Right arm, BP Patient Position: At rest)   ??? Pulse 76   ??? Temp 98 ??F (36.7 ??C)   ??? Resp 18   ??? SpO2 99%       Right lateral chest mediport was accessed with 20 gauge 1 inch huber(s) after chloroprep.    Port flushed easily and had brisk blood return. Mediport flushed with NS 20 ml and Heparin 500 units then de-accessed.     No irritation or bleeding noted. Band-Aid applied.    Patient refused to allow access of Right medial chest mediport.  She was unable to feel the port and refused for me to access.     Melissa Leblanc tolerated the procedure, and had no complaints. Patient armband removed and shredded.    Melissa Leblanc was discharged from [facility] in stable condition at [facility]. She is to follow up with Dr. Nadyne Coombes today and schedule her next appointment for port flush.  She states she will be out of town for 3 months and will talk with Dr Nadyne Coombes about this today.    Renelda Mom, RN  October 17, 2015

## 2015-10-18 LAB — METABOLIC PANEL, COMPREHENSIVE
A-G Ratio: 1.1 (ref 1.1–2.5)
ALT (SGPT): 14 IU/L (ref 0–32)
AST (SGOT): 15 IU/L (ref 0–40)
Albumin: 4 g/dL (ref 3.5–5.5)
Alk. phosphatase: 75 IU/L (ref 39–117)
BUN/Creatinine ratio: 9 (ref 9–23)
BUN: 6 mg/dL (ref 6–24)
Bilirubin, total: 0.2 mg/dL (ref 0.0–1.2)
CO2: 22 mmol/L (ref 18–29)
Calcium: 9.4 mg/dL (ref 8.7–10.2)
Chloride: 102 mmol/L (ref 96–106)
Creatinine: 0.64 mg/dL (ref 0.57–1.00)
GFR est AA: 122 mL/min/{1.73_m2} (ref >59)
GFR est non-AA: 106 mL/min/{1.73_m2} (ref >59)
GLOBULIN, TOTAL: 3.5 g/dL (ref 1.5–4.5)
Glucose: 93 mg/dL (ref 65–99)
Potassium: 4.7 mmol/L (ref 3.5–5.2)
Protein, total: 7.5 g/dL (ref 6.0–8.5)
Sodium: 141 mmol/L (ref 134–144)

## 2015-10-18 LAB — IRON PROFILE
Iron % saturation: 12 % — ABNORMAL LOW (ref 15–55)
Iron: 34 ug/dL (ref 27–159)
TIBC: 292 ug/dL (ref 250–450)
UIBC: 258 ug/dL (ref 131–425)

## 2015-10-18 LAB — SED RATE (ESR): Sed rate (ESR): 28 mm/hr (ref 0–32)

## 2015-10-18 LAB — FERRITIN: Ferritin: 122 ng/mL (ref 15–150)

## 2015-10-18 NOTE — Progress Notes (Signed)
Result reviewed and noted. Will continue to monitor.

## 2015-10-23 MED ORDER — WARFARIN 1 MG TAB
1 mg | ORAL_TABLET | ORAL | 0 refills | Status: DC
Start: 2015-10-23 — End: 2015-10-23

## 2015-10-24 MED ORDER — WARFARIN 1 MG TAB
1 mg | ORAL_TABLET | ORAL | 0 refills | Status: DC
Start: 2015-10-24 — End: 2016-01-03

## 2015-10-25 ENCOUNTER — Encounter: Attending: Sports Medicine | Primary: Family

## 2015-11-01 NOTE — Telephone Encounter (Signed)
Patient called stating she had the information you requested. Please call her.

## 2015-11-06 NOTE — Telephone Encounter (Signed)
Patient called today stating she is staying with her daughter at [address] [place] [address] and Dr. Nadyne Coombes stated he would hook her up with a doctor there so she could continue with her medications and medical care. Will be there two months.

## 2015-11-08 NOTE — Telephone Encounter (Signed)
Pt is suppose to let me know what Oncologist she is close to NO her address

## 2015-11-08 NOTE — Telephone Encounter (Signed)
Tried to call pt on 11/08/15.  Pt # is disconnected

## 2015-11-13 NOTE — Progress Notes (Signed)
Patient called back stating no one had returned her calls about getting set up with an oncologist in [place]. After reviewing her chart our MA did call her on 11/08/15 but her phone was disconnected. I called the patient back and explained to her that she needed to find an Oncologist in [place], and she also needed to contact her insurance to make sure the will cover the new physician. Once she has that information straight she needs to sign a release with that doctor so we can send her records.

## 2015-11-14 MED ORDER — OXYCODONE-ACETAMINOPHEN 10 MG-325 MG TAB
10-325 mg | ORAL_TABLET | Freq: Four times a day (QID) | ORAL | 0 refills | Status: DC | PRN
Start: 2015-11-14 — End: 2015-12-14

## 2015-11-15 ENCOUNTER — Encounter: Attending: Sports Medicine | Primary: Family

## 2015-11-22 ENCOUNTER — Inpatient Hospital Stay: Admit: 2015-11-22 | Payer: MEDICARE | Primary: Family

## 2015-11-22 DIAGNOSIS — C8191 Hodgkin lymphoma, unspecified, lymph nodes of head, face, and neck: Secondary | ICD-10-CM

## 2015-11-22 MED ORDER — HEPARIN LOCK FLUSH 100 UNIT/ML IV SOLN
100 unit/mL | INTRAVENOUS | Status: AC | PRN
Start: 2015-11-22 — End: 2015-11-23
  Administered 2015-11-22: 13:00:00 via INTRAVENOUS

## 2015-11-22 MED ORDER — SODIUM CHLORIDE 0.9 % IJ SYRG
INTRAMUSCULAR | Status: DC | PRN
Start: 2015-11-22 — End: 2015-11-26
  Administered 2015-11-22: 13:00:00 via INTRAVENOUS

## 2015-11-22 MED FILL — BD POSIFLUSH NORMAL SALINE 0.9 % INJECTION SYRINGE: INTRAMUSCULAR | Qty: 40

## 2015-11-22 MED FILL — HEPARIN LOCK FLUSH 100 UNIT/ML IV SOLN: 100 unit/mL | INTRAVENOUS | Qty: 5

## 2015-11-22 NOTE — Progress Notes (Signed)
[facility] OPIC Progress Note    Date: November 22, 2015    Name: Melissa Leblanc    MRN: [id number]         DOB: [birth year]    Monthly Port flush     Melissa Leblanc was assessed and education was provided. Pt arrived ambulatory for scheduled monthly port flush, complaining of hemorrhoid pain. Pt currently using preparation H but states no relief. Advised pt to see primary care physician for further evaluation.    Melissa Leblanc's vitals were reviewed and patient was observed for 5 minutes prior to procedure.  Visit Vitals   ??? BP 122/87 (BP 1 Location: Right arm, BP Patient Position: At rest)   ??? Pulse 75   ??? Temp 97.7 ??F (36.5 ??C)   ??? Resp 18   ??? SpO2 98%       Mediport was accessed with 20 gauge, short huber(s) after chloroprep.    right chest,double Pt refused to have lateral side of mediport accessed, medial side accessed and flushed.    Blood return: YES        Flushed 20 of NS followed by Heparin 500u    Huber needle(s) removed. Band-Aid applied.    Melissa Leblanc tolerated the procedure, and had no complaints.    Melissa Leblanc was discharged from [place] in stable condition at 0930. She is to return on 12/22/2015 at 1000 for her next appointment.    [place], RN  November 22, 2015  1:59 PM

## 2015-12-14 MED ORDER — OXYCODONE-ACETAMINOPHEN 10 MG-325 MG TAB
10-325 mg | ORAL_TABLET | Freq: Four times a day (QID) | ORAL | 0 refills | Status: DC | PRN
Start: 2015-12-14 — End: 2016-03-11

## 2015-12-20 ENCOUNTER — Inpatient Hospital Stay: Admit: 2015-12-20 | Payer: MEDICARE | Primary: Family

## 2015-12-20 DIAGNOSIS — Z452 Encounter for adjustment and management of vascular access device: Secondary | ICD-10-CM

## 2015-12-20 MED ORDER — HEPARIN LOCK FLUSH 100 UNIT/ML IV SOLN
100 unit/mL | INTRAVENOUS | Status: AC | PRN
Start: 2015-12-20 — End: 2015-12-21

## 2015-12-20 MED ORDER — HEPARIN LOCK FLUSH 100 UNIT/ML IV SOLN
100 unit/mL | INTRAVENOUS | Status: AC
Start: 2015-12-20 — End: 2015-12-20
  Administered 2015-12-20: 13:00:00 via INTRAVENOUS

## 2015-12-20 MED ORDER — SODIUM CHLORIDE 0.9 % IJ SYRG
INTRAMUSCULAR | Status: DC | PRN
Start: 2015-12-20 — End: 2015-12-24
  Administered 2015-12-20: 13:00:00 via INTRAVENOUS

## 2015-12-20 MED FILL — HEPARIN LOCK FLUSH 100 UNIT/ML IV SOLN: 100 unit/mL | INTRAVENOUS | Qty: 10

## 2015-12-20 MED FILL — BD POSIFLUSH NORMAL SALINE 0.9 % INJECTION SYRINGE: INTRAMUSCULAR | Qty: 40

## 2015-12-20 NOTE — Progress Notes (Signed)
[facility] OPIC Progress Note    Date: Dec 20, 2015    Name: Melissa Leblanc    MRN: [id number]         DOB: April 16, 1966      Melissa Leblanc arrived in the [facility] today, at [phone], in stable condition, here for her Q 4 Week, Port Flush. She was assessed and education was provided.     Melissa Leblanc's vitals were reviewed.  Visit Vitals   ??? BP (!) 146/96 (BP 1 Location: Right arm, BP Patient Position: At rest;Sitting)   ??? Pulse 87   ??? Temp 98.5 ??F (36.9 ??C)   ??? Resp 20   ??? Breastfeeding No           Both lumens of her double lumen right chest port were accessed and flushed well per protocol, and without incident, at 0915.  Then, both huber needles were removed and gauze/bandaids X 2 were applied.                Melissa Leblanc tolerated well, and had no complaints.    Melissa Leblanc was discharged from [place] in stable condition at 0930.  She is to return on Tuesday, 01-30-16, at 0900,  for her next appointment, to have her port flushed. (She will not be returning in 4 weeks, on Wednesday, 01-17-16, when she is actually due to have her port flushed, because she stated that she will be out of town starting later today, and will not return until Monday, 01-29-16.)    Aida Raider, RN  Dec 20, 2015  9:05 AM

## 2016-01-09 MED ORDER — WARFARIN 1 MG TAB
1 mg | ORAL_TABLET | ORAL | 6 refills | Status: DC
Start: 2016-01-09 — End: 2016-12-19

## 2016-01-09 MED ORDER — WARFARIN 1 MG TAB
1 mg | ORAL_TABLET | ORAL | 0 refills | Status: DC
Start: 2016-01-09 — End: 2016-01-09

## 2016-01-17 ENCOUNTER — Encounter: Payer: MEDICARE | Primary: Family

## 2016-01-19 NOTE — Telephone Encounter (Signed)
Patient will be referred to pain management. Med not filled

## 2016-01-23 NOTE — Progress Notes (Signed)
Medical records sent to [facility] pain Management

## 2016-01-30 ENCOUNTER — Inpatient Hospital Stay: Admit: 2016-01-30 | Payer: MEDICARE | Primary: Family

## 2016-01-30 DIAGNOSIS — C8590 Non-Hodgkin lymphoma, unspecified, unspecified site: Secondary | ICD-10-CM

## 2016-01-30 MED ORDER — SODIUM CHLORIDE 0.9 % IJ SYRG
INTRAMUSCULAR | Status: DC | PRN
Start: 2016-01-30 — End: 2016-02-03
  Administered 2016-01-30: 13:00:00 via INTRAVENOUS

## 2016-01-30 MED ORDER — HEPARIN LOCK FLUSH 100 UNIT/ML IV SOLN
100 unit/mL | INTRAVENOUS | Status: AC | PRN
Start: 2016-01-30 — End: 2016-01-31

## 2016-01-30 MED ORDER — HEPARIN LOCK FLUSH 100 UNIT/ML IV SOLN
100 unit/mL | INTRAVENOUS | Status: AC
Start: 2016-01-30 — End: 2016-01-30

## 2016-01-30 MED FILL — BD POSIFLUSH NORMAL SALINE 0.9 % INJECTION SYRINGE: INTRAMUSCULAR | Qty: 40

## 2016-01-30 MED FILL — HEPARIN LOCK FLUSH 100 UNIT/ML IV SOLN: 100 unit/mL | INTRAVENOUS | Qty: 5

## 2016-01-30 NOTE — Progress Notes (Signed)
[facility] OPIC Progress Note    Date: January 30, 2016    Name: Ineisha Moffa    MRN: [id number]         DOB: [birth year]    Monthly Port flush    Ms. Mkrtchyan to [place], ambulatory, at [phone]. Pt was assessed and education was provided. Denies any complaints.     Ms. Magwood's vitals were reviewed.  Visit Vitals   ??? BP 129/86 (BP 1 Location: Right arm, BP Patient Position: Sitting)   ??? Pulse 91   ??? Temp 98.3 ??F (36.8 ??C)   ??? Resp 18   ??? SpO2 97%   ??? Breastfeeding No       Both ports of right chest mediport was accessed with 20 gauge 1 inch huber(s) after chloroprep.    Medial lumen flushed without difficulty and positive for blood return. Lateral port flushes without difficulty but no blood return. Pt did not want to do cathflo today. Each lumen flushed with 20 mL NS and 250 units of heparin.     No irritation or bleeding noted. Band-Aid applied.    Ms. Rossen tolerated the procedure, and had no complaints. Patient armband removed and shredded.    Ms. Staser was discharged from [facility] in stable condition at 0905. She is to return on 02/27/2016 at 0900 for her next appointment.    [name], RN  January 30, 2016

## 2016-02-07 ENCOUNTER — Ambulatory Visit: Admit: 2016-02-07 | Discharge: 2016-02-07 | Payer: MEDICARE | Attending: Sports Medicine | Primary: Family

## 2016-02-07 DIAGNOSIS — I10 Essential (primary) hypertension: Secondary | ICD-10-CM

## 2016-02-07 MED ORDER — FUROSEMIDE 20 MG TAB
20 mg | ORAL_TABLET | ORAL | 2 refills | Status: DC
Start: 2016-02-07 — End: 2016-12-19

## 2016-02-07 MED ORDER — LISINOPRIL-HYDROCHLOROTHIAZIDE 20 MG-25 MG TAB
20-25 mg | ORAL_TABLET | ORAL | 1 refills | Status: DC
Start: 2016-02-07 — End: 2016-12-19

## 2016-02-07 NOTE — Progress Notes (Signed)
1. Have you been to the ER, urgent care clinic since your last visit?  Hospitalized since your last visit?No    2. Have you seen or consulted any other health care providers outside of the [facility] since your last visit?  Include any pap smears or colon screening. No

## 2016-02-07 NOTE — Progress Notes (Signed)
HISTORY OF PRESENT ILLNESS    Melissa Leblanc is a 50 y.o. year old female here today for:  HTN, swelling    Has been doing well w/ HTN and needs refills and has been getting more swelling during the day and better in evenings.  Has had similar prior this time of year.    Current Outpatient Prescriptions   Medication Sig Dispense Refill   ??? warfarin (COUMADIN) 1 mg tablet TAKE 1 TABLET BY MOUTH EVERY DAY AT 6 PM OR AFTER 30 Tab 6   ??? lidocaine-prilocaine (EMLA) topical cream Apply  to affected area as needed for Pain. 30 g 3   ??? traZODone (DESYREL) 50 mg tablet Take 1 Tab by mouth nightly. 90 Tab 1   ??? lisinopril-hydrochlorothiazide (PRINZIDE, ZESTORETIC) 20-25 mg per tablet take 1 tablet by mouth once daily 90 Tab 1   ??? cyclobenzaprine (FLEXERIL) 5 mg tablet Take 1 Tab by mouth three (3) times daily as needed for Muscle Spasm(s). 90 Tab 3   ??? fluticasone (FLONASE) 50 mcg/actuation nasal spray 2 Sprays by Both Nostrils route daily. 1 Bottle 3   ??? oxyCODONE-acetaminophen (PERCOCET 10) 10-325 mg per tablet Take 1 Tab by mouth every six (6) hours as needed for Pain. Max Daily Amount: 4 Tabs. 120 Tab 0     Past Medical History:   Diagnosis Date   ??? Anxiety    ??? Arthritis 10/17/2015   ??? Asthma    ??? Bladder incontinence    ??? Cancer [facility])     lymph node cancer   ??? Depression 08/17/2014   ??? Depression 08/17/2014   ??? 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 GERD (gastroesophageal reflux disease)    ??? Hypertension    ??? Lymphoma ([place])    ??? Neck mass    ??? Prediabetes    ??? VAIN I (vaginal intraepithelial neoplasia grade I)      Social History     Social History   ??? Marital status: SINGLE     Spouse name: N/A   ??? Number of children: N/A   ??? Years of education: N/A     Social History Main Topics   ??? Smoking status: Current Every Day Smoker     Packs/day: 0.50     Years: 28.00   ??? Smokeless tobacco: Never Used      Comment: Seeing Dr. Starleen Blue to help her   ??? Alcohol use 1.8 - 2.4 oz/week     3 - 4 Glasses of wine per week       Comment: every 3 to 4 days   ??? Drug use: Yes     Special: Marijuana      Comment: one or 2 puffs occasionally   ??? Sexual activity: No     Other Topics Concern   ??? None     Social History Narrative     Family History   Problem Relation Age of Onset   ??? Cancer Mother      throat   ??? Breast Cancer Mother    ??? Colon Cancer Mother    ??? Hypertension Mother    ??? Colon Cancer Father    ??? Pacemaker Father    ??? Cancer Sister      thigh that moved to lung, sounds like myosarcoma?   ??? Hypertension Sister    ??? Alcohol abuse Neg Hx    ??? Arthritis-rheumatoid Neg Hx    ??? Asthma Neg Hx    ???  Bleeding Prob Neg Hx    ??? Diabetes Neg Hx    ??? Elevated Lipids Neg Hx    ??? Headache Neg Hx    ??? Lung Disease Neg Hx    ??? Migraines Neg Hx    ??? Psychiatric Disorder Neg Hx    ??? Stroke Neg Hx    ??? Mental Retardation Neg Hx        ROS:  See HPI.  No bruise.    Objective:  Visit Vitals   ??? BP 116/78 (BP 1 Location: Left arm, BP Patient Position: Sitting)   ??? Pulse 84   ??? Temp 98.4 ??F (36.9 ??C) (Oral)   ??? Resp 18   ??? Ht 5\' 5"  (1.651 m)   ??? Wt 216 lb (98 kg)   ??? SpO2 98%   ??? BMI 35.94 kg/m2     GEN:  Appears stated age in NAD.  HEENT: Conjunctiva/lids normal.  External ears and nose without lesions/trauma.  Hearing Intact.  Tongue midline.  NECK: Trachea midline.  Supple.  Full ROM  CARDIAC:  regular rate and rhythm. no Murmur, no peripheral edema.  LUNGS: lungs clear to auscultation, no accessory muscle use.  MS: no clubbing/cyanosis.  SKIN: Warm/dry without rash.  PSYCH: Appropriate insight, Judgment.  A&O x 3.      Assessment/Plan:     ICD-10-CM ICD-9-CM    1. Essential hypertension I10 401.9 lisinopril-hydroCHLOROthiazide (PRINZIDE, ZESTORETIC) 20-25 mg per tablet   2. Muscle cramp R25.2 729.82    3. Venous stasis I87.8 459.81 furosemide (LASIX) 20 mg tablet       Patient verbalizes understanding of evaluation and plan.    Will stertch as demo and refill Rx and use lasix PRN and RTC as needed - plans to move to [place] in July.

## 2016-02-12 NOTE — Telephone Encounter (Signed)
Patient states at her last appointment she was supposed to get a script for muscle relaxers. Please advise [phone].

## 2016-02-13 ENCOUNTER — Inpatient Hospital Stay: Admit: 2016-02-13 | Primary: Family

## 2016-02-13 ENCOUNTER — Ambulatory Visit: Admit: 2016-02-13 | Discharge: 2016-02-13 | Payer: MEDICARE | Attending: Hematology & Oncology | Primary: Family

## 2016-02-13 DIAGNOSIS — C8191 Hodgkin lymphoma, unspecified, lymph nodes of head, face, and neck: Secondary | ICD-10-CM

## 2016-02-13 DIAGNOSIS — D72819 Decreased white blood cell count, unspecified: Secondary | ICD-10-CM | POA: Insufficient documentation

## 2016-02-13 LAB — CBC WITH AUTOMATED DIFF
ABS. BASOPHILS: 0 10*3/uL (ref 0.0–0.06)
ABS. EOSINOPHILS: 0.1 10*3/uL (ref 0.0–0.4)
ABS. LYMPHOCYTES: 2.3 10*3/uL (ref 0.9–3.6)
ABS. MONOCYTES: 0.5 10*3/uL (ref 0.05–1.2)
ABS. NEUTROPHILS: 2.6 10*3/uL (ref 1.8–8.0)
BASOPHILS: 0 % (ref 0–2)
EOSINOPHILS: 1 % (ref 0–5)
HCT: 39.3 % (ref 35.0–45.0)
HGB: 13.1 g/dL (ref 12.0–16.0)
LYMPHOCYTES: 42 % (ref 21–52)
MCH: 25.7 PG (ref 24.0–34.0)
MCHC: 33.3 g/dL (ref 31.0–37.0)
MCV: 77.2 FL (ref 74.0–97.0)
MONOCYTES: 9 % (ref 3–10)
MPV: 11 FL (ref 9.2–11.8)
NEUTROPHILS: 48 % (ref 40–73)
PLATELET: 265 10*3/uL (ref 135–420)
RBC: 5.09 M/uL (ref 4.20–5.30)
RDW: 16.7 % — ABNORMAL HIGH (ref 11.6–14.5)
WBC: 5.4 10*3/uL (ref 4.6–13.2)

## 2016-02-13 NOTE — Patient Instructions (Signed)
Hodgkin's: Care Instructions  Your Care Instructions  Hodgkin's is a type of cancer that affects part of the immune system (lymph system). Cells normally found in the lymph nodes and spleen can increase in number and collect in areas where they cause problems. Hodgkin's, also called Hodgkin's lymphoma, is not contagious and is not caused by an injury.  Treatment for Hodgkin's depends on the stage of the lymphoma and what type of lymphoma you have. It is usually treated with medicines called chemotherapy. You may also need radiation treatments or a procedure called a bone marrow transplant. Your doctor will talk to you about what kind of treatment may be best for you.  When you find out that you have cancer, you may feel many emotions and may need some help coping. Seek out family, friends, and counselors for support. You also can do things at home to make yourself feel better while you go through treatment. Call the [employer] (1-800-227-2345) or visit its website at www.cancer.org for more information.  Follow-up care is a key part of your treatment and safety. Be sure to make and go to all appointments, and call your doctor if you are having problems. It's also a good idea to know your test results and keep a list of the medicines you take.  How can you care for yourself at home?  ?? Take your medicines exactly as prescribed. Call your doctor if you think you are having a problem with your medicine. You may get medicine for nausea and vomiting if you have these side effects.  ?? Eat healthy food. If you do not feel like eating, try to eat food that has protein and extra calories to keep up your strength and prevent weight loss. Drink liquid meal replacements for extra calories and protein. Try to eat your main meal early.  ?? Get some physical activity every day, but do not get too tired. Keep doing the hobbies you enjoy as your energy allows.   ?? Take steps to control your stress and workload. Learn relaxation techniques.  ?? Share your feelings. Stress and tension affect our emotions. By expressing your feelings to others, you may be able to understand and cope with them.  ?? Consider joining a support group. Talking about a problem with your spouse, a good friend, or other people with similar problems is a good way to reduce tension and stress.  ?? Express yourself through art. Try writing, crafts, dance, or art to relieve stress. Some dance, writing, or art groups may be available just for people who have cancer.  ?? Be kind to your body and mind. Getting enough sleep, eating a healthy diet, and taking time to do things you enjoy can contribute to an overall feeling of balance in your life and can help reduce stress.  ?? Get help if you need it. Discuss your concerns with your doctor or counselor.  ?? If you are vomiting or have diarrhea:  ?? Drink plenty of fluids (enough so that your urine is light yellow or clear like water) to prevent dehydration. Choose water and other caffeine-free clear liquids. If you have kidney, heart, or liver disease and have to limit fluids, talk with your doctor before you increase the amount of fluids you drink.  ?? When you are able to eat, try clear soups, mild foods, and liquids until all symptoms are gone for 12 to 48 hours. Other good choices include dry toast, crackers, cooked cereal, and gelatin dessert, such   as Jell-O.  ?? If you have not already done so, prepare a list of advance directives. Advance directives are instructions to your doctor and family members about what kind of care you want if you become unable to speak or express yourself.  When should you call for help?  Call 911 anytime you think you may need emergency care. For example, call if:  ?? You passed out (lost consciousness).  ?? You have trouble breathing.  ?? You cough up blood.  ?? You vomit blood or what looks like coffee grounds.   ?? You pass maroon or very bloody stools.  ?? You have symptoms of a stroke. These may include:  ?? Sudden numbness, tingling, weakness, or loss of movement in your face, arm, or leg, especially on only one side of your body.  ?? Sudden vision changes.  ?? Sudden trouble speaking.  ?? Sudden confusion or trouble understanding simple statements.  ?? Sudden problems with walking or balance.  ?? A sudden, severe headache that is different from past headaches.  Call your doctor now or seek immediate medical care if:  ?? You have severe pain.  ?? You are dizzy or lightheaded, or you feel like you may faint.  ?? You are sick to your stomach or cannot keep fluids down.  ?? You have any unusual bleeding, such as:  ?? Blood spots under the skin.  ?? A nosebleed that you cannot stop.  ?? Bleeding gums when you brush your teeth.  ?? Blood in your urine.  ?? Vaginal bleeding when you are not having your period, or heavy period bleeding.  ?? Your stools are black and tarlike or have streaks of blood.  ?? You have signs of an infection, such as:  ?? Increased pain, swelling, warmth, or redness.  ?? Red streaks leading from a bruise.  ?? Pus draining from a wound.  ?? A fever or chills.  Watch closely for changes in your health, and be sure to contact your doctor if you have any problems.  Where can you learn more?  Go to http://www.healthwise.net/GoodHelp[employer]ctions.  Enter B619 in the search box to learn more about "Hodgkin's: Care Instructions."  Current as of: March 14, 2015  Content Version: 11.3  ?? 2006-2017 [employer]wise[employer]. Care instructions adapted under license by [employer] (which disclaims liability or warranty for this information). If you have questions about a medical condition or this instruction, always ask your healthcare professional. Healthwise, Inc[employer] disclaims any warranty or liability for your use of this information.

## 2016-02-13 NOTE — Progress Notes (Signed)
Hematology/Oncology  Progress Note    Name: Melissa Leblanc  Date: 02/13/2016  DOB: 04/14/1966    BTD:VVOHY M. Starleen Blue, DO     Melissa Leblanc is a 50 year old female who was seen for management of her Hodgkin's lymphoma.    Current therapy: active surveillance, the patient previously completed 8 cycles of systemic chemotherapy with the ABVD regimen (2014)  Subjective:     Melissa Leblanc is a 50 year old African American woman has a history of Hodgkin's lymphoma. She has previously completed a full course of systemic chemotherapy with a combination of ABVD    In 2014. She denies fevers, night sweats. Her appetite is excellent. She denies any lymphadenopathy. She is complaining of soreness around her mediport site and is requesting to have this removed under general anesthesia.  She has no other complaints to report.    Past medical history, family history, and social history: these were reviewed and remains unchanged.    Past Medical History:   Diagnosis Date   ??? Anxiety    ??? Arthritis 10/17/2015   ??? Asthma    ??? Bladder incontinence    ??? Cancer [facility])     lymph node cancer   ??? Depression 08/17/2014   ??? Depression 08/17/2014   ??? 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 GERD (gastroesophageal reflux disease)    ??? Hypertension    ??? Lymphoma ([place])    ??? Neck mass    ??? Prediabetes    ??? VAIN I (vaginal intraepithelial neoplasia grade I)      Past Surgical History:   Procedure Laterality Date   ??? HX GYN      BTL   ??? HX HYSTERECTOMY  2004   ??? HX OTHER SURGICAL      lymph node biopsies neck   ??? HX VASCULAR ACCESS      double right chest mediport     Social History     Social History   ??? Marital status: SINGLE     Spouse name: N/A   ??? Number of children: N/A   ??? Years of education: N/A     Occupational History   ??? Not on file.     Social History Main Topics   ??? Smoking status: Current Every Day Smoker     Packs/day: 0.50     Years: 28.00   ??? Smokeless tobacco: Never Used      Comment: Seeing Dr. Starleen Blue to help her    ??? Alcohol use 1.8 - 2.4 oz/week     3 - 4 Glasses of wine per week      Comment: every 3 to 4 days   ??? Drug use: Yes     Special: Marijuana      Comment: one or 2 puffs occasionally   ??? Sexual activity: No     Other Topics Concern   ??? Not on file     Social History Narrative     Family History   Problem Relation Age of Onset   ??? Cancer Mother      throat   ??? Breast Cancer Mother    ??? Colon Cancer Mother    ??? Hypertension Mother    ??? Colon Cancer Father    ??? Pacemaker Father    ??? Cancer Sister      thigh that moved to lung, sounds like myosarcoma?   ??? Hypertension Sister    ??? Alcohol abuse Neg Hx    ???  Arthritis-rheumatoid Neg Hx    ??? Asthma Neg Hx    ??? Bleeding Prob Neg Hx    ??? Diabetes Neg Hx    ??? Elevated Lipids Neg Hx    ??? Headache Neg Hx    ??? Lung Disease Neg Hx    ??? Migraines Neg Hx    ??? Psychiatric Disorder Neg Hx    ??? Stroke Neg Hx    ??? Mental Retardation Neg Hx      Current Outpatient Prescriptions   Medication Sig Dispense Refill   ??? lisinopril-hydroCHLOROthiazide (PRINZIDE, ZESTORETIC) 20-25 mg per tablet take 1 tablet by mouth once daily 90 Tab 1   ??? furosemide (LASIX) 20 mg tablet Take as needed daily for leg swelling. 30 Tab 2   ??? warfarin (COUMADIN) 1 mg tablet TAKE 1 TABLET BY MOUTH EVERY DAY AT 6 PM OR AFTER 30 Tab 6   ??? oxyCODONE-acetaminophen (PERCOCET 10) 10-325 mg per tablet Take 1 Tab by mouth every six (6) hours as needed for Pain. Max Daily Amount: 4 Tabs. 120 Tab 0   ??? lidocaine-prilocaine (EMLA) topical cream Apply  to affected area as needed for Pain. 30 g 3   ??? traZODone (DESYREL) 50 mg tablet Take 1 Tab by mouth nightly. 90 Tab 1   ??? cyclobenzaprine (FLEXERIL) 5 mg tablet Take 1 Tab by mouth three (3) times daily as needed for Muscle Spasm(s). 90 Tab 3   ??? fluticasone (FLONASE) 50 mcg/actuation nasal spray 2 Sprays by Both Nostrils route daily. 1 Bottle 3       Review of Systems  Constitutional: The patient has no acute distress or discomfort.   HEENT: The patient denies recent head trauma, eye pain, blurred vision,  hearing deficit, oropharyngeal mucosal pain or lesions, and the patient denies throat pain or discomfort.  Lymphatics: The patient denies palpable peripheral lymphadenopathy.  Hematologic: The patient denies having bruising, bleeding, or progressive fatigue.  Respiratory: the patient is complaining of some cough, low-grade fever and running nose.  Cardiovascular: The patient denies having leg pain, leg swelling, heart palpitations, chest permit, chest pain, or lightheadedness.  The patient denies having dyspnea on exertion.  Gastrointestinal: The patient denies having nausea, emesis, or diarrhea. The patient denies having any hematemesis or blood in the stool.  Genitourinary: Patient denies having urinary urgency, frequency, or dysuria.  The patient denies having blood in the urine.  Psychological: The patient denies having symptoms of nervousness, anxiety, depression, or thoughts of harming herself  Or anybody .  Skin: Patient denies having skin rashes, skin, ulcerations, or unexplained itching or pruritus.  Musculoskeletal: The patient denies having pain in the joints or bones.      Objective:     Visit Vitals   ??? BP (!) 154/97   ??? Pulse 89   ??? Temp 97.9 ??F (36.6 ??C)   ??? Ht 5' 5"  (1.651 m)   ??? Wt 97.5 kg (215 lb)   ??? BMI 35.78 kg/m2     ECOG PS=0, pain score=0/10    Physical Exam:   Gen. Appearance: The patient is in no acute distress.  Skin: There is no bruise or rash.  HEENT: The exam is unremarkable.  Neck: Supple without lymphadenopathy or thyromegaly.  Lungs: Clear to auscultation and percussion; there are no wheezes or rhonchi.  Heart: Regular rate and rhythm; there are no murmurs, gallops, or rubs.  Abdomen: Bowel sounds are present and normal.  There is no guarding, tenderness, or hepatosplenomegaly.  Extremities: There  is no clubbing, cyanosis, or edema.  Neurologic: There are no focal neurologic deficits.  Lymphatics:  There is no palpable peripheral lymphadenopathy. Musculoskeletal: The patient has full range of motion at all joints.  There is no evidence of joint deformity or effusions.  There is no focal joint tenderness.  Psychological/psychiatric: There is no clinical evidence of anxiety, depression, or melancholy.    Lab data:      Results for orders placed or performed during the hospital encounter of 10/17/15   CBC WITH 3 PART DIFF     Status: Abnormal   Result Value Ref Range Status    WBC 5.2 4.5 - 13.0 K/uL Final    RBC 4.93 4.10 - 5.10 M/uL Final    HGB 12.5 12.0 - 16.0 g/dL Final    HCT 38.3 36 - 48 % Final    MCV 77.7 (L) 78 - 102 FL Final    MCH 25.4 25.0 - 35.0 PG Final    MCHC 32.6 31 - 37 g/dL Final    RDW 16.8 (H) 11.5 - 14.5 % Final    PLATELET 251 140 - 440 K/uL Final    NEUTROPHILS 54 40 - 70 % Final    MIXED CELLS 3 0.1 - 17 % Final    LYMPHOCYTES 43 14 - 44 % Final    ABS. NEUTROPHILS 2.8 1.8 - 9.5 K/UL Final    ABS. MIXED CELLS 0.2 0.0 - 2.3 K/uL Final    ABS. LYMPHOCYTES 2.2 1.1 - 5.9 K/UL Final     Comment: Test performed at [facility]. Results Reviewed by Medical Director.    DF AUTOMATED   Final           Assessment:     1. Hodgkin's disease of head, face, and neck ([place])    2. Chronic anemia    3. Chronic leukopenia    4. Arthritis      Plan:   Hodgkin's lymphoma: The most recent CT scans showed no evidence of recurrent disease.  At this time I will check the sedimentation rate and comprehensive metabolic panel.  The patient has no clinical symptoms of recurrent Hodgkin's lymphoma.    Leukopenia: The current CBC shows her WBC today normal at 5.7 with an absolute neutrophil count of 2.6 and absolute lymphocyte count of 2.8.  I have explained to the patient that her leukopenia has completely resolved.    Iron deficiency anemia: The patient has continued to take the over-the-counter iron supplement once daily. Her current hemoglobin is  13.0 g/dL with hematocrit of 40.7%.  I recommended that she continue to take the over-the-counter iron supplement, ferrous sulfate 1 tablet daily.    Chronic arthritis : I have recommended that the patient began cotinue Aleve 1 tablet twice daily.   She has previously been provided Percocet in the past for severe pain.     The patient will be scheduled to see Dr. Karena Addison for mediport removal. The patient is tentatively planned to move to [facility], [place] in a few weeks. She states she will come back to the make the appointment for Dr. Jimmye Norman.         Orders Placed This Encounter   ??? COMPLETE CBC & AUTO DIFF WBC   ??? InHouse CBC ([facility])     Standing Status:   Future     Number of Occurrences:   1     Standing Expiration Date:   12/20/6642   ??? METABOLIC PANEL, COMPREHENSIVE  Standing Status:   Future     Number of Occurrences:   1     Standing Expiration Date:   02/13/2017   ??? IRON PROFILE     Standing Status:   Future     Number of Occurrences:   1     Standing Expiration Date:   02/13/2017   ??? FERRITIN     Standing Status:   Future     Number of Occurrences:   1     Standing Expiration Date:   02/13/2017   ??? SED RATE (ESR)     Standing Status:   Future     Number of Occurrences:   1     Standing Expiration Date:   02/13/2017       Suzy Bouchard, MD  02/13/2016

## 2016-02-13 NOTE — Telephone Encounter (Signed)
Called patient and informed her that Dr. Starleen Blue is out of the office and advised that per the progress notes there is nothing that states she was suppose to get muscle relaxer's. Patient was advised that she will have to wait until doctor gets back to confirm with him.

## 2016-02-14 LAB — IRON PROFILE
Iron % saturation: 16 % (ref 15–55)
Iron: 43 ug/dL (ref 27–159)
TIBC: 266 ug/dL (ref 250–450)
UIBC: 223 ug/dL (ref 131–425)

## 2016-02-14 LAB — METABOLIC PANEL, COMPREHENSIVE
A-G Ratio: 1.2 (ref 1.2–2.2)
ALT (SGPT): 17 IU/L (ref 0–32)
AST (SGOT): 20 IU/L (ref 0–40)
Albumin: 4.2 g/dL (ref 3.5–5.5)
Alk. phosphatase: 79 IU/L (ref 39–117)
BUN/Creatinine ratio: 7 — ABNORMAL LOW (ref 9–23)
BUN: 6 mg/dL (ref 6–24)
Bilirubin, total: 0.2 mg/dL (ref 0.0–1.2)
CO2: 25 mmol/L (ref 18–29)
Calcium: 10.1 mg/dL (ref 8.7–10.2)
Chloride: 104 mmol/L (ref 96–106)
Creatinine: 0.82 mg/dL (ref 0.57–1.00)
GFR est AA: 98 mL/min/{1.73_m2} (ref >59)
GFR est non-AA: 85 mL/min/{1.73_m2} (ref >59)
GLOBULIN, TOTAL: 3.5 g/dL (ref 1.5–4.5)
Glucose: 90 mg/dL (ref 65–99)
Potassium: 4.9 mmol/L (ref 3.5–5.2)
Protein, total: 7.7 g/dL (ref 6.0–8.5)
Sodium: 143 mmol/L (ref 134–144)

## 2016-02-14 LAB — FERRITIN: Ferritin: 120 ng/mL (ref 15–150)

## 2016-02-14 LAB — SED RATE (ESR): Sed rate (ESR): 21 mm/hr (ref 0–32)

## 2016-02-19 ENCOUNTER — Telehealth

## 2016-02-19 NOTE — Telephone Encounter (Signed)
Pt said that no one told her that she wasn't suppose to have any muscle relaxer's. She has been waiting on a call from Dr. Starleen Blue to get her muscle relaxer's.

## 2016-02-21 MED ORDER — CYCLOBENZAPRINE 5 MG TAB
5 mg | ORAL_TABLET | Freq: Three times a day (TID) | ORAL | 3 refills | Status: DC | PRN
Start: 2016-02-21 — End: 2016-12-19

## 2016-02-21 NOTE — Telephone Encounter (Signed)
Called patient to inform rx was sent in.

## 2016-02-21 NOTE — Telephone Encounter (Signed)
Called patient and advised that per the progress notes there is nothing that states she was suppose to get muscle relaxer's. Advised that I will contact her once I have spoken to doctor.

## 2016-02-21 NOTE — Telephone Encounter (Signed)
I sent Rx in to [facility] in [place] for her.

## 2016-02-27 ENCOUNTER — Inpatient Hospital Stay: Payer: MEDICARE | Primary: Family

## 2016-03-06 ENCOUNTER — Ambulatory Visit: Admit: 2016-03-06 | Discharge: 2016-03-06 | Payer: MEDICARE | Attending: Surgery | Primary: Family

## 2016-03-06 DIAGNOSIS — C8590 Non-Hodgkin lymphoma, unspecified, unspecified site: Secondary | ICD-10-CM

## 2016-03-06 NOTE — Progress Notes (Signed)
[facility]  General Surgery    Subjective:      HPI:  Pt is very pleasant 50 yo female with a PMHx of chronic anemia, leukopenia, lymphoma and back pain. She has completed chemotherapy.      Patient Active Problem List    Diagnosis Date Noted   ??? 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 Chronic anemia 10/17/2015   ??? Arthritis 10/17/2015   ??? Depression 08/17/2014   ??? Advance directive discussed with patient 08/17/2014   ??? Bladder incontinence    ??? Acute URI 05/24/2014   ??? Leukopenia 12/07/2013   ??? Back pain 11/27/2012   ??? Anemia 11/20/2012   ??? Hodgkin's disease of head, face, and neck ([place]) 09/15/2012   ??? Supraclavicular lymphadenopathy 08/05/2012   ??? Urge incontinence 12/27/2011   ??? Prediabetes 06/03/2011   ??? HTN (hypertension) 06/03/2011   ??? 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 Anxiety    ??? Arthritis 10/17/2015   ??? Asthma    ??? Bladder incontinence    ??? Cancer [facility])     lymph node cancer   ??? Depression 08/17/2014   ??? Diabetes ([facility])     border line diabetes   ??? GERD (gastroesophageal reflux disease)    ??? Hypertension    ??? Lymphoma ([place])    ??? Neck mass    ??? Prediabetes    ??? VAIN I (vaginal intraepithelial neoplasia grade I)       Past Surgical History:   Procedure Laterality Date   ??? HX GYN      BTL   ??? HX HYSTERECTOMY  2004   ??? HX OTHER SURGICAL      lymph node biopsies neck   ??? HX VASCULAR ACCESS      double right chest mediport      Family History   Problem Relation Age of Onset   ??? Cancer Mother      throat   ??? Breast Cancer Mother    ??? Colon Cancer Mother    ??? Hypertension Mother    ??? Colon Cancer Father    ??? Pacemaker Father    ??? Cancer Sister      thigh that moved to lung, sounds like myosarcoma?   ??? Hypertension Sister    ??? Alcohol abuse Neg Hx    ??? Arthritis-rheumatoid Neg Hx    ??? Asthma Neg Hx    ??? Bleeding Prob Neg Hx    ??? Diabetes Neg Hx    ??? Elevated Lipids Neg Hx    ??? Headache Neg Hx    ??? Lung Disease Neg Hx     ??? Migraines Neg Hx    ??? Psychiatric Disorder Neg Hx    ??? Stroke Neg Hx    ??? Mental Retardation Neg Hx       Social History   Substance Use Topics   ??? Smoking status: Current Every Day Smoker     Packs/day: 0.50     Years: 28.00   ??? Smokeless tobacco: Never Used   ??? Alcohol use 1.8 - 2.4 oz/week     3 - 4 Glasses of wine per week      Comment: every 3 to 4 days      Allergies   Allergen Reactions   ??? Vicodin [Hydrocodone-Acetaminophen] Itching       Prior to Admission medications    Medication Sig Start  Date End Date Taking? Authorizing Provider   cyclobenzaprine (FLEXERIL) 5 mg tablet Take 1 Tab by mouth three (3) times daily as needed for Muscle Spasm(s). 02/21/16  Yes Zackery Barefoot, DO   lisinopril-hydroCHLOROthiazide (PRINZIDE, ZESTORETIC) 20-25 mg per tablet take 1 tablet by mouth once daily 02/07/16  Yes Zackery Barefoot, DO   furosemide (LASIX) 20 mg tablet Take as needed daily for leg swelling. 02/07/16  Yes Zackery Barefoot, DO   warfarin (COUMADIN) 1 mg tablet TAKE 1 TABLET BY MOUTH EVERY DAY AT 6 PM OR AFTER 01/09/16  Yes Corinna Lines, NP       Review of Systems:    14 systems were reviewed.  The results are as above in the HPI and otherwise negative.     Objective:     There were no vitals filed for this visit.    Physical Exam:  GENERAL: alert, cooperative, no distress, appears stated age,   EYE: conjunctivae/corneas clear. PERRL, EOM's intact.  THROAT & NECK: normal and no erythema or exudates noted. ,    LYMPHATIC: Cervical, supraclavicular, and axillary nodes normal. ,   LUNG: clear to auscultation bilaterally,   HEART: regular rate and rhythm, S1, S2 normal, no murmur, click, rub or gallop,   ABDOMEN: soft, non-tender. Bowel sounds normal. No masses,  no organomegaly,   EXTREMITIES:  extremities normal, atraumatic, no cyanosis or edema,   SKIN: right chest wall with intact mediport without evidence of infection  NEUROLOGIC: AOx3. Cranial nerves 2-12 and sensation grossly intact.,      Data Review:  to be done    Impression:     ?? Pt with lymphoma in remission.     Plan:     ?? Removal of right chest wall mediport  ?? Consent on chart  ?? Preoperative orders written    Signed By: Lia Hopping, MD     March 14, 2016

## 2016-03-06 NOTE — H&P (View-Only) (Signed)
[facility]  General Surgery    Subjective:      HPI:  Pt is very pleasant 50 yo female with a PMHx of chronic anemia, leukopenia, lymphoma and back pain. She has completed chemotherapy.      Patient Active Problem List    Diagnosis Date Noted   ??? 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 Chronic anemia 10/17/2015   ??? Arthritis 10/17/2015   ??? Depression 08/17/2014   ??? Advance directive discussed with patient 08/17/2014   ??? Bladder incontinence    ??? Acute URI 05/24/2014   ??? Leukopenia 12/07/2013   ??? Back pain 11/27/2012   ??? Anemia 11/20/2012   ??? Hodgkin's disease of head, face, and neck ([place]) 09/15/2012   ??? Supraclavicular lymphadenopathy 08/05/2012   ??? Urge incontinence 12/27/2011   ??? Prediabetes 06/03/2011   ??? HTN (hypertension) 06/03/2011   ??? 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 Anxiety    ??? Arthritis 10/17/2015   ??? Asthma    ??? Bladder incontinence    ??? Cancer [facility])     lymph node cancer   ??? Depression 08/17/2014   ??? Diabetes (HCC)     border line diabetes   ??? GERD (gastroesophageal reflux disease)    ??? Hypertension    ??? Lymphoma ([place])    ??? Neck mass    ??? Prediabetes    ??? VAIN I (vaginal intraepithelial neoplasia grade I)       Past Surgical History:   Procedure Laterality Date   ??? HX GYN      BTL   ??? HX HYSTERECTOMY  2004   ??? HX OTHER SURGICAL      lymph node biopsies neck   ??? HX VASCULAR ACCESS      double right chest mediport      Family History   Problem Relation Age of Onset   ??? Cancer Mother      throat   ??? Breast Cancer Mother    ??? Colon Cancer Mother    ??? Hypertension Mother    ??? Colon Cancer Father    ??? Pacemaker Father    ??? Cancer Sister      thigh that moved to lung, sounds like myosarcoma?   ??? Hypertension Sister    ??? Alcohol abuse Neg Hx    ??? Arthritis-rheumatoid Neg Hx    ??? Asthma Neg Hx    ??? Bleeding Prob Neg Hx    ??? Diabetes Neg Hx    ??? Elevated Lipids Neg Hx    ??? Headache Neg Hx    ??? Lung Disease Neg Hx     ??? Migraines Neg Hx    ??? Psychiatric Disorder Neg Hx    ??? Stroke Neg Hx    ??? Mental Retardation Neg Hx       Social History   Substance Use Topics   ??? Smoking status: Current Every Day Smoker     Packs/day: 0.50     Years: 28.00   ??? Smokeless tobacco: Never Used   ??? Alcohol use 1.8 - 2.4 oz/week     3 - 4 Glasses of wine per week      Comment: every 3 to 4 days      Allergies   Allergen Reactions   ??? Vicodin [Hydrocodone-Acetaminophen] Itching       Prior to Admission medications    Medication Sig Start  Date End Date Taking? Authorizing Provider   cyclobenzaprine (FLEXERIL) 5 mg tablet Take 1 Tab by mouth three (3) times daily as needed for Muscle Spasm(s). 02/21/16  Yes Zackery Barefoot, DO   lisinopril-hydroCHLOROthiazide (PRINZIDE, ZESTORETIC) 20-25 mg per tablet take 1 tablet by mouth once daily 02/07/16  Yes Zackery Barefoot, DO   furosemide (LASIX) 20 mg tablet Take as needed daily for leg swelling. 02/07/16  Yes Zackery Barefoot, DO   warfarin (COUMADIN) 1 mg tablet TAKE 1 TABLET BY MOUTH EVERY DAY AT 6 PM OR AFTER 01/09/16  Yes Corinna Lines, NP       Review of Systems:    14 systems were reviewed.  The results are as above in the HPI and otherwise negative.     Objective:     There were no vitals filed for this visit.    Physical Exam:  GENERAL: alert, cooperative, no distress, appears stated age,   EYE: conjunctivae/corneas clear. PERRL, EOM's intact.  THROAT & NECK: normal and no erythema or exudates noted. ,    LYMPHATIC: Cervical, supraclavicular, and axillary nodes normal. ,   LUNG: clear to auscultation bilaterally,   HEART: regular rate and rhythm, S1, S2 normal, no murmur, click, rub or gallop,   ABDOMEN: soft, non-tender. Bowel sounds normal. No masses,  no organomegaly,   EXTREMITIES:  extremities normal, atraumatic, no cyanosis or edema,   SKIN: right chest wall with intact mediport without evidence of infection  NEUROLOGIC: AOx3. Cranial nerves 2-12 and sensation grossly intact.,      Data Review:  to be done    Impression:     ?? Pt with lymphoma in remission.     Plan:     ?? Removal of right chest wall mediport  ?? Consent on chart  ?? Preoperative orders written    Signed By: Lia Hopping, MD     March 14, 2016

## 2016-03-11 ENCOUNTER — Inpatient Hospital Stay: Admit: 2016-03-11 | Payer: MEDICARE | Primary: Family

## 2016-03-11 DIAGNOSIS — Z01818 Encounter for other preprocedural examination: Secondary | ICD-10-CM

## 2016-03-11 DIAGNOSIS — C8590 Non-Hodgkin lymphoma, unspecified, unspecified site: Secondary | ICD-10-CM

## 2016-03-11 LAB — EKG 12-LEAD
Atrial Rate: 80 {beats}/min
P Axis: 64 degrees
P-R Interval: 152 ms
Q-T Interval: 406 ms
QRS Duration: 82 ms
QTc Calculation (Bazett): 468 ms
R Axis: 33 degrees
T Axis: -13 degrees
Ventricular Rate: 80 {beats}/min

## 2016-03-11 LAB — METABOLIC PANEL, BASIC
Anion gap: 7 mmol/L (ref 3.0–18)
BUN/Creatinine ratio: 9 — ABNORMAL LOW (ref 12–20)
BUN: 7 MG/DL (ref 7.0–18)
CO2: 26 mmol/L (ref 21–32)
Calcium: 9.5 MG/DL (ref 8.5–10.1)
Chloride: 107 mmol/L (ref 100–108)
Creatinine: 0.76 MG/DL (ref 0.6–1.3)
GFR est AA: >60 mL/min/{1.73_m2} (ref >60)
GFR est non-AA: >60 mL/min/{1.73_m2} (ref >60)
Glucose: 86 mg/dL (ref 74–99)
Potassium: 4.2 mmol/L (ref 3.5–5.5)
Sodium: 140 mmol/L (ref 136–145)

## 2016-03-11 LAB — CBC WITH AUTOMATED DIFF
ABS. BASOPHILS: 0 10*3/uL (ref 0.0–0.1)
ABS. EOSINOPHILS: 0 10*3/uL (ref 0.0–0.4)
ABS. LYMPHOCYTES: 2.6 10*3/uL (ref 0.9–3.6)
ABS. MONOCYTES: 0.4 10*3/uL (ref 0.05–1.2)
ABS. NEUTROPHILS: 2.9 10*3/uL (ref 1.8–8.0)
BASOPHILS: 0 % (ref 0–2)
EOSINOPHILS: 1 % (ref 0–5)
HCT: 36.3 % (ref 35.0–45.0)
HGB: 12.3 g/dL (ref 12.0–16.0)
LYMPHOCYTES: 44 % (ref 21–52)
MCH: 25.7 PG (ref 24.0–34.0)
MCHC: 33.9 g/dL (ref 31.0–37.0)
MCV: 75.9 FL (ref 74.0–97.0)
MONOCYTES: 6 % (ref 3–10)
MPV: 10.5 FL (ref 9.2–11.8)
NEUTROPHILS: 49 % (ref 40–73)
PLATELET: 259 10*3/uL (ref 135–420)
RBC: 4.78 M/uL (ref 4.20–5.30)
RDW: 16.7 % — ABNORMAL HIGH (ref 11.6–14.5)
WBC: 5.9 10*3/uL (ref 4.6–13.2)

## 2016-03-11 LAB — EKG, 12 LEAD, INITIAL
Atrial Rate: 80 {beats}/min
Calculated P Axis: 64 degrees
Calculated R Axis: 33 degrees
Calculated T Axis: -13 degrees
P-R Interval: 152 ms
Q-T Interval: 406 ms
QRS Duration: 82 ms
QTC Calculation (Bezet): 468 ms
Ventricular Rate: 80 {beats}/min

## 2016-03-11 NOTE — Telephone Encounter (Signed)
I called [phone] at 10:10am on 03/11/2016 and left a message for Mrs. Debruin to return my call at [phone] to discuss scheduling a surgery date and time with Dr. Jimmye Norman. Mrs. Christopher will also need to stop by today 03/11/2016 to sign her consent form and to complete her pre-op testing at either [facility] or [facility]... JDG

## 2016-03-14 ENCOUNTER — Inpatient Hospital Stay: Payer: MEDICARE

## 2016-03-14 LAB — GLUCOSE, POC: Glucose (POC): 82 mg/dL (ref 70–110)

## 2016-03-14 LAB — DRUG SCREEN, URINE
AMPHETAMINES: NEGATIVE
BARBITURATES: NEGATIVE
BENZODIAZEPINES: NEGATIVE
COCAINE: NEGATIVE
METHADONE: NEGATIVE
OPIATES: NEGATIVE
PCP(PHENCYCLIDINE): NEGATIVE
THC (TH-CANNABINOL): NEGATIVE

## 2016-03-14 MED ORDER — PROPOFOL INFUSION
INTRAVENOUS | Status: DC | PRN
Start: 2016-03-14 — End: 2016-03-14
  Administered 2016-03-14: 18:00:00 via INTRAVENOUS

## 2016-03-14 MED ORDER — LIDOCAINE-EPINEPHRINE (PF) 1 %-1:200,000 IJ SOLN
1 %-:200,000 | INTRAMUSCULAR | Status: AC
Start: 2016-03-14 — End: ?

## 2016-03-14 MED ORDER — CEFAZOLIN 2 GM/50 ML IN DEXTROSE (ISO-OSMOTIC) IVPB
2 gram/50 mL | Freq: Once | INTRAVENOUS | Status: AC
Start: 2016-03-14 — End: 2016-03-14
  Administered 2016-03-14: 17:00:00 via INTRAVENOUS

## 2016-03-14 MED ORDER — LIDOCAINE (PF) 20 MG/ML (2 %) IJ SOLN
20 mg/mL (2 %) | INTRAMUSCULAR | Status: AC
Start: 2016-03-14 — End: ?

## 2016-03-14 MED ORDER — KETOROLAC TROMETHAMINE 30 MG/ML INJECTION
30 mg/mL (1 mL) | Freq: Once | INTRAMUSCULAR | Status: AC | PRN
Start: 2016-03-14 — End: 2016-03-14
  Administered 2016-03-14: 19:00:00 via INTRAVENOUS

## 2016-03-14 MED ORDER — INSULIN LISPRO 100 UNIT/ML INJECTION
100 unit/mL | Freq: Once | SUBCUTANEOUS | Status: DC
Start: 2016-03-14 — End: 2016-03-14

## 2016-03-14 MED ORDER — LIDOCAINE (PF) 10 MG/ML (1 %) IJ SOLN
10 mg/mL (1 %) | INTRAMUSCULAR | Status: DC | PRN
Start: 2016-03-14 — End: 2016-03-14

## 2016-03-14 MED ORDER — FENTANYL CITRATE (PF) 50 MCG/ML IJ SOLN
50 mcg/mL | INTRAMUSCULAR | Status: DC | PRN
Start: 2016-03-14 — End: 2016-03-14
  Administered 2016-03-14 (×2): via INTRAVENOUS

## 2016-03-14 MED ORDER — PROPOFOL 10 MG/ML IV EMUL
10 mg/mL | INTRAVENOUS | Status: AC
Start: 2016-03-14 — End: ?

## 2016-03-14 MED ORDER — FENTANYL CITRATE (PF) 50 MCG/ML IJ SOLN
50 mcg/mL | INTRAMUSCULAR | Status: AC
Start: 2016-03-14 — End: ?

## 2016-03-14 MED ORDER — MIDAZOLAM 1 MG/ML IJ SOLN
1 mg/mL | INTRAMUSCULAR | Status: DC | PRN
Start: 2016-03-14 — End: 2016-03-14
  Administered 2016-03-14: 17:00:00 via INTRAVENOUS

## 2016-03-14 MED ORDER — KETOROLAC TROMETHAMINE 30 MG/ML INJECTION
30 mg/mL (1 mL) | INTRAMUSCULAR | Status: AC
Start: 2016-03-14 — End: 2016-03-14
  Administered 2016-03-14: 19:00:00 via INTRAVENOUS

## 2016-03-14 MED ORDER — SODIUM CHLORIDE 0.9 % IJ SYRG
INTRAMUSCULAR | Status: DC | PRN
Start: 2016-03-14 — End: 2016-03-14

## 2016-03-14 MED ORDER — GLYCOPYRROLATE 0.2 MG/ML IJ SOLN
0.2 mg/mL | INTRAMUSCULAR | Status: AC
Start: 2016-03-14 — End: ?

## 2016-03-14 MED ORDER — SODIUM CHLORIDE 0.9 % IJ SYRG
Freq: Three times a day (TID) | INTRAMUSCULAR | Status: DC
Start: 2016-03-14 — End: 2016-03-14

## 2016-03-14 MED ORDER — FAMOTIDINE 20 MG TAB
20 mg | Freq: Once | ORAL | Status: AC
Start: 2016-03-14 — End: 2016-03-14
  Administered 2016-03-14: 16:00:00 via ORAL

## 2016-03-14 MED ORDER — ONDANSETRON (PF) 4 MG/2 ML INJECTION
4 mg/2 mL | INTRAMUSCULAR | Status: AC
Start: 2016-03-14 — End: ?

## 2016-03-14 MED ORDER — MIDAZOLAM 1 MG/ML IJ SOLN
1 mg/mL | INTRAMUSCULAR | Status: AC
Start: 2016-03-14 — End: ?

## 2016-03-14 MED ORDER — OXYCODONE-ACETAMINOPHEN 5 MG-325 MG TAB
5-325 mg | ORAL_TABLET | ORAL | 0 refills | Status: DC | PRN
Start: 2016-03-14 — End: 2016-12-19

## 2016-03-14 MED ORDER — LIDOCAINE (PF) 20 MG/ML (2 %) IJ SOLN
20 mg/mL (2 %) | INTRAMUSCULAR | Status: DC | PRN
Start: 2016-03-14 — End: 2016-03-14
  Administered 2016-03-14: 18:00:00 via INTRAVENOUS

## 2016-03-14 MED ORDER — LACTATED RINGERS IV
INTRAVENOUS | Status: DC
Start: 2016-03-14 — End: 2016-03-14
  Administered 2016-03-14: 16:00:00 via INTRAVENOUS

## 2016-03-14 MED ORDER — BUPIVACAINE (PF) 0.25 % (2.5 MG/ML) IJ SOLN
0.25 % (2.5 mg/mL) | INTRAMUSCULAR | Status: AC
Start: 2016-03-14 — End: ?

## 2016-03-14 MED FILL — ONDANSETRON (PF) 4 MG/2 ML INJECTION: 4 mg/2 mL | INTRAMUSCULAR | Qty: 2

## 2016-03-14 MED FILL — MIDAZOLAM 1 MG/ML IJ SOLN: 1 mg/mL | INTRAMUSCULAR | Qty: 2

## 2016-03-14 MED FILL — PROPOFOL 10 MG/ML IV EMUL: 10 mg/mL | INTRAVENOUS | Qty: 20

## 2016-03-14 MED FILL — BUPIVACAINE (PF) 0.25 % (2.5 MG/ML) IJ SOLN: 0.25 % (2.5 mg/mL) | INTRAMUSCULAR | Qty: 30

## 2016-03-14 MED FILL — BD POSIFLUSH NORMAL SALINE 0.9 % INJECTION SYRINGE: INTRAMUSCULAR | Qty: 10

## 2016-03-14 MED FILL — CEFAZOLIN 2 GM/50 ML IN DEXTROSE (ISO-OSMOTIC) IVPB: 2 gram/50 mL | INTRAVENOUS | Qty: 50

## 2016-03-14 MED FILL — FAMOTIDINE 20 MG TAB: 20 mg | ORAL | Qty: 1

## 2016-03-14 MED FILL — GLYCOPYRROLATE 0.2 MG/ML IJ SOLN: 0.2 mg/mL | INTRAMUSCULAR | Qty: 2

## 2016-03-14 MED FILL — LIDOCAINE (PF) 20 MG/ML (2 %) IJ SOLN: 20 mg/mL (2 %) | INTRAMUSCULAR | Qty: 5

## 2016-03-14 MED FILL — XYLOCAINE-MPF/EPINEPHRINE 1 %-1:200,000 INJECTION SOLUTION: 1 %-:200,000 | INTRAMUSCULAR | Qty: 30

## 2016-03-14 MED FILL — PROPOFOL 10 MG/ML IV EMUL: 10 mg/mL | INTRAVENOUS | Qty: 60

## 2016-03-14 MED FILL — FENTANYL CITRATE (PF) 50 MCG/ML IJ SOLN: 50 mcg/mL | INTRAMUSCULAR | Qty: 2

## 2016-03-14 MED FILL — KETOROLAC TROMETHAMINE 30 MG/ML INJECTION: 30 mg/mL (1 mL) | INTRAMUSCULAR | Qty: 1

## 2016-03-14 MED FILL — XYLOCAINE-MPF 20 MG/ML (2 %) INJECTION SOLUTION: 20 mg/mL (2 %) | INTRAMUSCULAR | Qty: 5

## 2016-03-14 MED FILL — LACTATED RINGERS IV: INTRAVENOUS | Qty: 1000

## 2016-03-14 NOTE — Op Note (Signed)
[facility]   [place], [place] [address]     OPERATIVE REPORT     PATIENT: Melissa Leblanc  MRN: [id number] DATE: 03/14/2016  BILLING: [id number]  ROOM: PACU/PL    ATTENDING: Lia Hopping, MD   DICTATING: Lia Hopping, MD     PREOPERATIVE DIAGNOSIS: Lymphoma  POSTOPERATIVE DIAGNOSIS: Same   PROCEDURE PERFORMED: Removal of MediPort  SURGEON: Zoe Goonan P. Jimmye Norman, MD   ASSISTANT: Chelsea Aus, SA   ANESTHESIA: Monitored anesthesia care and local (0.25% Marcaine with epinephrine).   FINDINGS: Intact MediPort.   SPECIMEN: MediPort.   ESTIMATED BLOOD LOSS: 5 mL   FLUIDS GIVEN: 0000000 mL   COMPLICATIONS: None.   INDICATION:  MediPort no longer needed.   DESCRIPTION OF PROCEDURE: The patient was identified in the holding area,   where consent for MediPort removal was verified. The chest wall was marked   with the surgeon's initials to ensure correct site surgery. The patient did   receive perioperative antibiotics. In the operating room, the patient was   placed under monitored anesthesia care. Both arms were tucked. The chest   wall was prepped and draped in sterile fashion using chlorhexidine solution   and sterile drapes. The time-out was performed to ensure correct procedure.   Local anesthetic was infiltrated into the skin and deep dermal tissues at   the prior incision and surrounding the port. The 15 blade was used to make   an incision in the old incision. The retractors were inserted and   dissection proceeded to remove the port and the catheter. The fibrous   capsule surrounding the port was then also removed. Hemostasis was obtained   using electrocautery.A 3-0 Vicryl suture and 4-0 Monocryl suture, along with Dermabond, were used to seal the wound. The patient tolerated the procedure very well.   DISPOSITION: He was stable upon transport to the recovery room.

## 2016-03-14 NOTE — Interval H&P Note (Signed)
H&P Update:  Melissa Leblanc was seen and examined.  History and physical has been reviewed. The patient has been examined. There have been no significant clinical changes since the completion of the originally dated History and Physical.    Signed By: Lia Hopping, MD     March 14, 2016 1:20 PM
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
## 2016-03-14 NOTE — Anesthesia Pre-Procedure Evaluation (Signed)
Anesthetic History   No history of anesthetic complications            Review of Systems / Medical History  Patient summary reviewed and pertinent labs reviewed    Pulmonary          Smoker  Asthma : well controlled       Neuro/Psych         Psychiatric history     Cardiovascular    Hypertension: well controlled              Exercise tolerance: <4 METS     GI/Hepatic/Renal     GERD: well controlled           Endo/Other        Arthritis and cancer  Pertinent negatives: No diabetes   Other Findings   Comments:   Risk Factors for Postoperative nausea/vomiting:       History of postoperative nausea/vomiting?  NO       Female?  YES       Motion sickness?  NO       Intended opioid administration for postoperative analgesia?  NO      Smoking Abstinence  Current Smoker?   YES  Elective Surgery? YES  Seen preoperatively by anesthesiologist or proxy prior to day of surgery?  YES  Pt abstained from smoking 24 hours prior to anesthesia? NO           Physical Exam    Airway  Mallampati: II  TM Distance: 4 - 6 cm  Neck ROM: normal range of motion   Mouth opening: Normal     Cardiovascular  Regular rate and rhythm,  S1 and S2 normal,  no murmur, click, rub, or gallop             Dental    Dentition: Edentulous     Pulmonary  Breath sounds clear to auscultation               Abdominal  GI exam deferred       Other Findings            Anesthetic Plan    ASA: 3  Anesthesia type: MAC          Induction: Intravenous  Anesthetic plan and risks discussed with: Patient

## 2016-03-14 NOTE — Anesthesia Post-Procedure Evaluation (Signed)
Post-Anesthesia Evaluation & Assessment    Visit Vitals   ??? BP 114/75   ??? Pulse 84   ??? Temp 36.6 ??C (97.9 ??F)   ??? Resp 18   ??? Ht _0  (1.651 m)   ??? Wt 97.4 kg (214 lb 12.8 oz)   ??? SpO2 100%   ??? BMI 35.74 kg/m2       Post-operative hydration adequate.    Pain score (VAS): 0 Pain Scale 1: Numeric (0 - 10) (03/14/16 1454)  Pain Intensity 1: 0 (03/14/16 1454)   Managed.      Mental status & Level of consciousness: returned to baseline    Neurological status: returned to baseline    Pulmonary status: airway patent, oxygen given as needed.     Complications related to anesthesia: none    Patient has met all discharge requirements.    Additional comments:        Jill Poling, MD  March 14, 2016
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
## 2016-03-14 NOTE — Op Note (Signed)
[facility]   [place], [place] [address]     OPERATIVE REPORT     PATIENT: Melissa Leblanc  MRN: [id number] DATE: 03/14/2016  BILLING: [id number]  ROOM: PACU/PL    ATTENDING: Lia Hopping, MD   DICTATING: Lia Hopping, MD     PREOPERATIVE DIAGNOSIS: Lymphoma  POSTOPERATIVE DIAGNOSIS: Same   PROCEDURE PERFORMED: Removal of MediPort  SURGEON: Sage Kopera P. Jimmye Norman, MD   ASSISTANT: Chelsea Aus, SA   ANESTHESIA: Monitored anesthesia care and local (0.25% Marcaine with epinephrine).   FINDINGS: Intact MediPort.   SPECIMEN: MediPort.   ESTIMATED BLOOD LOSS: 5 mL   FLUIDS GIVEN: 0000000 mL   COMPLICATIONS: None.   INDICATION:  MediPort no longer needed.   DESCRIPTION OF PROCEDURE: The patient was identified in the holding area,   where consent for MediPort removal was verified. The chest wall was marked   with the surgeon's initials to ensure correct site surgery. The patient did   receive perioperative antibiotics. In the operating room, the patient was   placed under monitored anesthesia care. Both arms were tucked. The chest   wall was prepped and draped in sterile fashion using chlorhexidine solution   and sterile drapes. The time-out was performed to ensure correct procedure.   Local anesthetic was infiltrated into the skin and deep dermal tissues at   the prior incision and surrounding the port. The 15 blade was used to make   an incision in the old incision. The retractors were inserted and   dissection proceeded to remove the port and the catheter. The fibrous   capsule surrounding the port was then also removed. Hemostasis was obtained   using electrocautery.A 3-0 Vicryl suture and 4-0 Monocryl suture, along with Dermabond, were used to seal the wound. The patient tolerated the procedure very well.   DISPOSITION: He was stable upon transport to the recovery room.

## 2016-03-15 NOTE — Telephone Encounter (Signed)
Patient had her mediport removed yesterday and is asking if she needs to re-start blood thinners today?    Please call her at [phone] today

## 2016-03-20 ENCOUNTER — Ambulatory Visit: Admit: 2016-03-20 | Payer: MEDICARE | Attending: Surgery | Primary: Family

## 2016-03-20 DIAGNOSIS — Z09 Encounter for follow-up examination after completed treatment for conditions other than malignant neoplasm: Secondary | ICD-10-CM

## 2016-03-20 NOTE — Progress Notes (Signed)
[facility]  General Surgery    Name: Melissa Leblanc MRN: [id number]   DOB: 1965/09/02 Hospital: [facility]   Date: 04/30/2016 Admission Date: No admission date for patient encounter.       Subjective:  Patient with history of non-Hodgkin's lymphoma status post MediPort removal.  She has no complaints.  Objective:  Vitals:    03/20/16 1535   BP: 116/62   Pulse: 97   Resp: 16   Temp: 99 ??F (37.2 ??C)   TempSrc: Oral   SpO2: 94%   Weight: 96.6 kg (213 lb)   Height: 5\' 5"  (1.651 m)       Physical Exam:    General: Awake and alert, oriented ??4, no apparent distress     Skin: Right chest wall incision is healing well.    Current Medications:  Current Outpatient Prescriptions   Medication Sig Dispense Refill   ??? oxyCODONE-acetaminophen (PERCOCET) 5-325 mg per tablet Take 1-2 Tabs by mouth every four (4) hours as needed for Pain. Max Daily Amount: 12 Tabs. 40 Tab 0   ??? cyclobenzaprine (FLEXERIL) 5 mg tablet Take 1 Tab by mouth three (3) times daily as needed for Muscle Spasm(s). 90 Tab 3   ??? lisinopril-hydroCHLOROthiazide (PRINZIDE, ZESTORETIC) 20-25 mg per tablet take 1 tablet by mouth once daily 90 Tab 1   ??? furosemide (LASIX) 20 mg tablet Take as needed daily for leg swelling. 30 Tab 2   ??? warfarin (COUMADIN) 1 mg tablet TAKE 1 TABLET BY MOUTH EVERY DAY AT 6 PM OR AFTER 30 Tab 6       Chart and notes reviewed. Data reviewed. I have evaluated and examined the patient.        IMPRESSION:   ?? Patient with lymphoma non-Hodgkin's finished her chemotherapy presently in remission status post Mediport removal doing well.      PLAN:/DISCUSION:   ?? Follow-up as needed        Lia Hopping, MD

## 2016-03-26 MED ORDER — HEPARIN LOCK FLUSH 100 UNIT/ML IV SOLN
100 unit/mL | INTRAVENOUS | Status: DC | PRN
Start: 2016-03-26 — End: 2016-04-26

## 2016-03-26 MED ORDER — SODIUM CHLORIDE 0.9 % IJ SYRG
INTRAMUSCULAR | Status: DC | PRN
Start: 2016-03-26 — End: 2016-04-26

## 2016-03-26 MED FILL — BD POSIFLUSH NORMAL SALINE 0.9 % INJECTION SYRINGE: INTRAMUSCULAR | Qty: 40

## 2016-03-27 ENCOUNTER — Inpatient Hospital Stay: Payer: MEDICARE | Primary: Family

## 2016-04-22 ENCOUNTER — Encounter: Payer: MEDICARE | Primary: Family

## 2016-10-16 NOTE — Addendum Note (Signed)
Addended by: Riki Rusk C on: 10/16/2016 01:27 PM      Modules accepted: Orders

## 2016-10-16 NOTE — Addendum Note (Signed)
Addended by: Riki Rusk C on: 10/16/2016 12:40 PM      Modules accepted: Orders

## 2016-12-19 ENCOUNTER — Ambulatory Visit: Admit: 2016-12-19 | Discharge: 2016-12-19 | Payer: PRIVATE HEALTH INSURANCE | Attending: Family | Primary: Family

## 2016-12-19 ENCOUNTER — Encounter

## 2016-12-19 DIAGNOSIS — I10 Essential (primary) hypertension: Secondary | ICD-10-CM

## 2016-12-19 LAB — AMB POC HEMOGLOBIN A1C: Hemoglobin A1c (POC): 6 %

## 2016-12-19 MED ORDER — FUROSEMIDE 20 MG TAB
20 mg | ORAL_TABLET | ORAL | 2 refills | Status: DC
Start: 2016-12-19 — End: 2017-04-18

## 2016-12-19 MED ORDER — CEPHALEXIN 500 MG CAP
500 mg | ORAL_CAPSULE | Freq: Four times a day (QID) | ORAL | 0 refills | Status: AC
Start: 2016-12-19 — End: 2016-12-29

## 2016-12-19 MED ORDER — LISINOPRIL-HYDROCHLOROTHIAZIDE 20 MG-25 MG TAB
20-25 mg | ORAL_TABLET | ORAL | 1 refills | Status: DC
Start: 2016-12-19 — End: 2017-08-26

## 2016-12-19 MED ORDER — CYCLOBENZAPRINE 5 MG TAB
5 mg | ORAL_TABLET | Freq: Three times a day (TID) | ORAL | 3 refills | Status: DC | PRN
Start: 2016-12-19 — End: 2017-08-26

## 2016-12-19 NOTE — Progress Notes (Signed)
HISTORY OF PRESENT ILLNESS  Melissa Leblanc is a 51 y.o. female.  HPI Comments: New pt to practice.     Here to establish care, Pertinent PMHx include HTN, Hodgkin's disease, prediabetes. She has not taken any of her medications in over 8 months. She c/o headaches and lightheadedness today.   C/o skin abscess/boil to buttock x 3 months. Reports area is leaking bloody drainage, she has not tried any treatment for this.  Requesting med refills today. Her med list include coumadin but patient reports she has not been on this since her Medi-port was removed.       Establish Care   The history is provided by the patient. This is a new problem.   Hypertension    The history is provided by the patient. This is a chronic problem. The current episode started more than 1 week ago. The problem has been gradually worsening. Risk factors include obesity, dyslipidemia, diabetes mellitus, family history and non-compliant.   Skin Problem   The history is provided by the patient. This is a recurrent problem. The current episode started more than 1 week ago. The problem occurs daily. The problem has not changed since onset.She has tried nothing for the symptoms.       Review of Systems   Constitutional: Negative.    Respiratory: Negative.    Cardiovascular: Negative.    Musculoskeletal: Negative.         BLE cramping     Skin:        Abscess to buttock.   Neurological: Negative.      Past Medical History:   Diagnosis Date   ??? Anxiety    ??? Arthritis 10/17/2015   ??? Asthma    ??? Bladder incontinence    ??? Cancer [facility])     lymph node cancer   ??? Depression 08/17/2014   ??? Diabetes (HCC)     border line diabetes   ??? GERD (gastroesophageal reflux disease)    ??? Hypertension    ??? Lymphoma ([place])    ??? Neck mass    ??? Prediabetes    ??? VAIN I (vaginal intraepithelial neoplasia grade I)      Past Surgical History:   Procedure Laterality Date   ??? HX GYN      BTL   ??? HX HYSTERECTOMY  2004   ??? HX OTHER SURGICAL      lymph node biopsies neck    ??? HX VASCULAR ACCESS      double right chest mediport   ??? PR RMVL TUN CTR VAD W/SUBQ PORT/PMP CTR/PRPH INSJ N/A 03/14/2016    Dr. Jimmye Norman     No current outpatient prescriptions on file prior to visit.     No current facility-administered medications on file prior to visit.        Allergies and Intolerances:   Allergies   Allergen Reactions   ??? Vicodin [Hydrocodone-Acetaminophen] Itching       Family History:   Family History   Problem Relation Age of Onset   ??? Cancer Mother      throat   ??? Breast Cancer Mother    ??? Colon Cancer Mother    ??? Hypertension Mother    ??? Colon Cancer Father    ??? Pacemaker Father    ??? Cancer Sister      thigh that moved to lung, sounds like myosarcoma?   ??? Hypertension Sister    ??? Alcohol abuse Neg Hx    ??? Arthritis-rheumatoid Neg Hx    ???  Asthma Neg Hx    ??? Bleeding Prob Neg Hx    ??? Diabetes Neg Hx    ??? Elevated Lipids Neg Hx    ??? Headache Neg Hx    ??? Lung Disease Neg Hx    ??? Migraines Neg Hx    ??? Psychiatric Disorder Neg Hx    ??? Stroke Neg Hx    ??? Mental Retardation Neg Hx        Social History:   She  reports that she has been smoking.  She has a 14.00 pack-year smoking history. She has never used smokeless tobacco. She  reports that she drinks about 1.8 - 2.4 oz of alcohol per week   Vitals:   Visit Vitals   ??? BP 142/90   ??? Pulse 77   ??? Temp 96.2 ??F (35.7 ??C)   ??? Resp 14   ??? Ht 5\' 5"  (1.651 m)   ??? Wt 212 lb (96.2 kg)   ??? SpO2 97%   ??? BMI 35.28 kg/m2     Body surface area is 2.1 meters squared.  Recent Results (from the past 12 hour(s))   AMB POC HEMOGLOBIN A1C    Collection Time: 12/19/16 11:11 AM   Result Value Ref Range    Hemoglobin A1c (POC) 6.0 %       Physical Exam   Constitutional: She is oriented to person, place, and time. She appears well-developed and well-nourished.   HENT:   Head: Atraumatic.   Cardiovascular: Normal rate and regular rhythm.    Pulmonary/Chest: Effort normal and breath sounds normal.   Neurological: She is alert and oriented to person, place, and time.    Skin: Skin is warm.        Psychiatric: She has a normal mood and affect. Her behavior is normal.   Nursing note and vitals reviewed.      ASSESSMENT and PLAN    ICD-10-CM ICD-9-CM    1. Essential hypertension I10 401.9 lisinopril-hydroCHLOROthiazide (PRINZIDE, ZESTORETIC) 20-25 mg per tablet      CBC WITH AUTOMATED DIFF      METABOLIC PANEL, COMPREHENSIVE      TSH 3RD GENERATION   2. Venous stasis I87.8 459.81 furosemide (LASIX) 20 mg tablet   3. Hodgkin's disease of head, face, and neck ([place]) C81.91 201.91 REFERRAL TO ONCOLOGY   4. Cutaneous abscess of buttock L02.31 682.5 cephALEXin (KEFLEX) 500 mg capsule      CULTURE, ANAEROBIC   5. Screening for hyperlipidemia Z13.220 V77.91 LIPID PANEL   6. Anticoagulated on Coumadin Z51.81 V58.83 PROTHROMBIN TIME + INR    Z79.01 V58.61    7. Muscle spasm M62.838 728.85 cyclobenzaprine (FLEXERIL) 5 mg tablet   8. Prediabetes R73.03 790.29 AMB POC HEMOGLOBIN A1C     Follow-up Disposition:  Return in about 4 weeks (around 01/16/2017), or if symptoms worsen or fail to improve, for HTN, skin abscess.  lab results and schedule of future lab studies reviewed with patient  very strongly urged to quit smoking to reduce cardiovascular risk  cardiovascular risk and specific lipid/LDL goals reviewed  reviewed medications and side effects in detail  specific diabetic recommendations: diabetic diet discussed in detail, written exchange diet given, low cholesterol diet, weight control and daily exercise discussed, glycohemoglobin and other lab monitoring discussed and long term diabetic complications discussed     - Alarm signals discussed. ER precautions  - Plan of care reviewed with patient.  Understanding verbalized and they are in agreement with plan of care.

## 2016-12-19 NOTE — Patient Instructions (Signed)
DASH Diet: Care Instructions  Your Care Instructions    The DASH diet is an eating plan that can help lower your blood pressure. DASH stands for Dietary Approaches to Stop Hypertension. Hypertension is high blood pressure.  The DASH diet focuses on eating foods that are high in calcium, potassium, and magnesium. These nutrients can lower blood pressure. The foods that are highest in these nutrients are fruits, vegetables, low-fat dairy products, nuts, seeds, and legumes. But taking calcium, potassium, and magnesium supplements instead of eating foods that are high in those nutrients does not have the same effect. The DASH diet also includes whole grains, fish, and poultry.  The DASH diet is one of several lifestyle changes your doctor may recommend to lower your high blood pressure. Your doctor may also want you to decrease the amount of sodium in your diet. Lowering sodium while following the DASH diet can lower blood pressure even further than just the DASH diet alone.  Follow-up care is a key part of your treatment and safety. Be sure to make and go to all appointments, and call your doctor if you are having problems. It's also a good idea to know your test results and keep a list of the medicines you take.  How can you care for yourself at home?  Following the DASH diet  ?? Eat 4 to 5 servings of fruit each day. A serving is 1 medium-sized piece of fruit, ?? cup chopped or canned fruit, 1/4 cup dried fruit, or 4 ounces (?? cup) of fruit juice. Choose fruit more often than fruit juice.  ?? Eat 4 to 5 servings of vegetables each day. A serving is 1 cup of lettuce or raw leafy vegetables, ?? cup of chopped or cooked vegetables, or 4 ounces (?? cup) of vegetable juice. Choose vegetables more often than vegetable juice.  ?? Get 2 to 3 servings of low-fat and fat-free dairy each day. A serving is 8 ounces of milk, 1 cup of yogurt, or 1 ?? ounces of cheese.   ?? Eat 6 to 8 servings of grains each day. A serving is 1 slice of bread, 1 ounce of dry cereal, or ?? cup of cooked rice, pasta, or cooked cereal. Try to choose whole-grain products as much as possible.  ?? Limit lean meat, poultry, and fish to 2 servings each day. A serving is 3 ounces, about the size of a deck of cards.  ?? Eat 4 to 5 servings of nuts, seeds, and legumes (cooked dried beans, lentils, and split peas) each week. A serving is 1/3 cup of nuts, 2 tablespoons of seeds, or ?? cup of cooked beans or peas.  ?? Limit fats and oils to 2 to 3 servings each day. A serving is 1 teaspoon of vegetable oil or 2 tablespoons of salad dressing.  ?? Limit sweets and added sugars to 5 servings or less a week. A serving is 1 tablespoon jelly or jam, ?? cup sorbet, or 1 cup of lemonade.  ?? Eat less than 2,300 milligrams (mg) of sodium a day. If you limit your sodium to 1,500 mg a day, you can lower your blood pressure even more.  Tips for success  ?? Start small. Do not try to make dramatic changes to your diet all at once. You might feel that you are missing out on your favorite foods and then be more likely to not follow the plan. Make small changes, and stick with them. Once those changes become habit, add a   few more changes.  ?? Try some of the following:  ?? Make it a goal to eat a fruit or vegetable at every meal and at snacks. This will make it easy to get the recommended amount of fruits and vegetables each day.  ?? Try yogurt topped with fruit and nuts for a snack or healthy dessert.  ?? Add lettuce, tomato, cucumber, and onion to sandwiches.  ?? Combine a ready-made pizza crust with low-fat mozzarella cheese and lots of vegetable toppings. Try using tomatoes, squash, spinach, broccoli, carrots, cauliflower, and onions.  ?? Have a variety of cut-up vegetables with a low-fat dip as an appetizer instead of chips and dip.  ?? Sprinkle sunflower seeds or chopped almonds over salads. Or try adding  chopped walnuts or almonds to cooked vegetables.  ?? Try some vegetarian meals using beans and peas. Add garbanzo or kidney beans to salads. Make burritos and tacos with mashed pinto beans or black beans.  Where can you learn more?  Go to [employer].  Enter [phone] in the search box to learn more about "DASH Diet: Care Instructions."  Current as of: May 10, 2015  Content Version: 11.4  ?? 2006-2017 [employer]. Care instructions adapted under license by [employer] (which disclaims liability or warranty for this information). If you have questions about a medical condition or this instruction, always ask your healthcare professional. [place] any warranty or liability for your use of this information.       Hodgkin's: Care Instructions  Your Care Instructions    Hodgkin's is a type of cancer that affects part of the immune system (lymph system). Cells normally found in the lymph nodes and spleen can increase in number and collect in areas where they cause problems. Hodgkin's, also called Hodgkin's lymphoma, is not contagious and is not caused by an injury.  Treatment for Hodgkin's depends on the stage of the lymphoma and what type of lymphoma you have. It is usually treated with medicines called chemotherapy. You may also need radiation treatments or a procedure called a bone marrow transplant. Your doctor will talk to you about what kind of treatment may be best for you.  When you find out that you have cancer, you may feel many emotions and may need some help coping. Seek out family, friends, and counselors for support. You also can do things at home to make yourself feel better while you go through treatment. Call the [facility] [phone]) or visit its website at [facility] for more information.  Follow-up care is a key part of your treatment and safety. Be sure to make  and go to all appointments, and call your doctor if you are having problems. It's also a good idea to know your test results and keep a list of the medicines you take.  How can you care for yourself at home?  ?? Take your medicines exactly as prescribed. Call your doctor if you think you are having a problem with your medicine. You may get medicine for nausea and vomiting if you have these side effects.  ?? Eat healthy food. If you do not feel like eating, try to eat food that has protein and extra calories to keep up your strength and prevent weight loss. Drink liquid meal replacements for extra calories and protein. Try to eat your main meal early.  ?? Get some physical activity every day, but do not get too tired. Keep doing the hobbies you enjoy as your energy allows.  ??  Take steps to control your stress and workload. Learn relaxation techniques.  ?? Share your feelings. Stress and tension affect our emotions. By expressing your feelings to others, you may be able to understand and cope with them.  ?? Consider joining a support group. Talking about a problem with your spouse, a good friend, or other people with similar problems is a good way to reduce tension and stress.  ?? Express yourself through art. Try writing, crafts, dance, or art to relieve stress. Some dance, writing, or art groups may be available just for people who have cancer.  ?? Be kind to your body and mind. Getting enough sleep, eating a healthy diet, and taking time to do things you enjoy can contribute to an overall feeling of balance in your life and can help reduce stress.  ?? Get help if you need it. Discuss your concerns with your doctor or counselor.  ?? If you are vomiting or have diarrhea:  ?? Drink plenty of fluids (enough so that your urine is light yellow or clear like water) to prevent dehydration. Choose water and other caffeine-free clear liquids. If you have kidney, heart, or liver disease  and have to limit fluids, talk with your doctor before you increase the amount of fluids you drink.  ?? When you are able to eat, try clear soups, mild foods, and liquids until all symptoms are gone for 12 to 48 hours. Other good choices include dry toast, crackers, cooked cereal, and gelatin dessert, such as Jell-O.  ?? If you have not already done so, prepare a list of advance directives. Advance directives are instructions to your doctor and family members about what kind of care you want if you become unable to speak or express yourself.  When should you call for help?  Call 911 anytime you think you may need emergency care. For example, call if:  ? ?? You passed out (lost consciousness).   ?Call your doctor now or seek immediate medical care if:  ? ?? You have a fever.   ? ?? You have abnormal bleeding.   ? ?? You have new or worse pain.   ? ?? You think you have an infection.   ? ?? You have new symptoms, such as a cough, belly pain, vomiting, diarrhea, or a rash.   ?Watch closely for changes in your health, and be sure to contact your doctor if:  ? ?? You are much more tired than usual.   ? ?? You have swollen glands in your armpits, groin, or neck.   ? ?? You do not get better as expected.   Where can you learn more?  Go to [employer].  Enter [phone] in the search box to learn more about "Hodgkin's: Care Instructions."  Current as of: Dec 29, 2015  Content Version: 11.4  ?? 2006-2017 [employer]. Care instructions adapted under license by [employer] (which disclaims liability or warranty for this information). If you have questions about a medical condition or this instruction, always ask your healthcare professional. [place] any warranty or liability for your use of this information.

## 2016-12-19 NOTE — Progress Notes (Signed)
1. Have you been to the ER, urgent care clinic since your last visit?  Hospitalized since your last visit?No    2. Have you seen or consulted any other health care providers outside of the [facility] since your last visit?  Include any pap smears or colon screening. No

## 2016-12-24 ENCOUNTER — Encounter

## 2016-12-24 LAB — CULTURE, ANAEROBIC: Anaerobic Culture: NO GROWTH

## 2016-12-26 ENCOUNTER — Inpatient Hospital Stay: Admit: 2016-12-26 | Payer: MEDICARE | Primary: Family

## 2016-12-26 DIAGNOSIS — Z13220 Encounter for screening for lipoid disorders: Secondary | ICD-10-CM

## 2016-12-26 LAB — METABOLIC PANEL, COMPREHENSIVE
A-G Ratio: 0.8 (ref 0.8–1.7)
ALT (SGPT): 19 U/L (ref 13–56)
AST (SGOT): 11 U/L — ABNORMAL LOW (ref 15–37)
Albumin: 3.5 g/dL (ref 3.4–5.0)
Alk. phosphatase: 77 U/L (ref 45–117)
Anion gap: 3 mmol/L (ref 3.0–18)
BUN/Creatinine ratio: 14 (ref 12–20)
BUN: 12 MG/DL (ref 7.0–18)
Bilirubin, total: 0.3 MG/DL (ref 0.2–1.0)
CO2: 30 mmol/L (ref 21–32)
Calcium: 9.1 MG/DL (ref 8.5–10.1)
Chloride: 109 mmol/L — ABNORMAL HIGH (ref 100–108)
Creatinine: 0.87 MG/DL (ref 0.6–1.3)
GFR est AA: >60 mL/min/{1.73_m2} (ref >60)
GFR est non-AA: >60 mL/min/{1.73_m2} (ref >60)
Globulin: 4.3 g/dL — ABNORMAL HIGH (ref 2.0–4.0)
Glucose: 101 mg/dL — ABNORMAL HIGH (ref 74–99)
Potassium: 5.1 mmol/L (ref 3.5–5.5)
Protein, total: 7.8 g/dL (ref 6.4–8.2)
Sodium: 142 mmol/L (ref 136–145)

## 2016-12-26 LAB — CBC WITH AUTOMATED DIFF
ABS. BASOPHILS: 0 10*3/uL (ref 0.0–0.06)
ABS. EOSINOPHILS: 0.1 10*3/uL (ref 0.0–0.4)
ABS. LYMPHOCYTES: 2 10*3/uL (ref 0.9–3.6)
ABS. MONOCYTES: 0.4 10*3/uL (ref 0.05–1.2)
ABS. NEUTROPHILS: 3.1 10*3/uL (ref 1.8–8.0)
BASOPHILS: 0 % (ref 0–2)
EOSINOPHILS: 1 % (ref 0–5)
HCT: 38.6 % (ref 35.0–45.0)
HGB: 13.3 g/dL (ref 12.0–16.0)
LYMPHOCYTES: 36 % (ref 21–52)
MCH: 26.3 PG (ref 24.0–34.0)
MCHC: 34.5 g/dL (ref 31.0–37.0)
MCV: 76.3 FL (ref 74.0–97.0)
MONOCYTES: 8 % (ref 3–10)
MPV: 10.1 FL (ref 9.2–11.8)
NEUTROPHILS: 55 % (ref 40–73)
PLATELET: 234 10*3/uL (ref 135–420)
RBC: 5.06 M/uL (ref 4.20–5.30)
RDW: 16.8 % — ABNORMAL HIGH (ref 11.6–14.5)
WBC: 5.6 10*3/uL (ref 4.6–13.2)

## 2016-12-26 LAB — LIPID PANEL
CHOL/HDL Ratio: 4.9 (ref 0–5.0)
Cholesterol, total: 194 MG/DL (ref <200)
HDL Cholesterol: 40 MG/DL (ref 40–60)
LDL, calculated: 137.6 MG/DL — ABNORMAL HIGH (ref 0–100)
Triglyceride: 82 MG/DL (ref <150)
VLDL, calculated: 16.4 MG/DL

## 2016-12-26 LAB — TSH 3RD GENERATION: TSH: 1.71 u[IU]/mL (ref 0.36–3.74)

## 2016-12-26 LAB — PROTHROMBIN TIME + INR
INR: 1 (ref 0.8–1.2)
Prothrombin time: 12.7 s (ref 11.5–15.2)

## 2016-12-26 NOTE — Progress Notes (Signed)
Left message on patient's voicemail to return call regarding test results.

## 2016-12-26 NOTE — Progress Notes (Signed)
Patient made aware of normal lab results and recommended diet. Verified name and DOB. Patient verbalized an understanding of results and did not voice any concerns at this time.

## 2017-01-10 ENCOUNTER — Ambulatory Visit: Admit: 2017-01-10 | Discharge: 2017-01-10 | Payer: MEDICARE | Attending: Hematology & Oncology | Primary: Family

## 2017-01-10 ENCOUNTER — Inpatient Hospital Stay: Admit: 2017-01-10 | Primary: Family

## 2017-01-10 ENCOUNTER — Inpatient Hospital Stay: Admit: 2017-01-10 | Payer: MEDICARE | Primary: Family

## 2017-01-10 DIAGNOSIS — C8191 Hodgkin lymphoma, unspecified, lymph nodes of head, face, and neck: Secondary | ICD-10-CM

## 2017-01-10 DIAGNOSIS — D649 Anemia, unspecified: Secondary | ICD-10-CM

## 2017-01-10 LAB — CBC WITH 3 PART DIFF
ABS. LYMPHOCYTES: 2.3 10*3/uL (ref 1.1–5.9)
ABS. MIXED CELLS: 0.3 10*3/uL (ref 0.0–2.3)
ABS. NEUTROPHILS: 2.4 10*3/uL (ref 1.8–9.5)
HCT: 39.8 % (ref 36–48)
HGB: 13.1 g/dL (ref 12.0–16.0)
LYMPHOCYTES: 47 % — ABNORMAL HIGH (ref 14–44)
MCH: 25.9 PG (ref 25.0–35.0)
MCHC: 32.9 g/dL (ref 31–37)
MCV: 78.8 FL (ref 78–102)
Mixed cells: 7 % (ref 0.1–17)
NEUTROPHILS: 47 % (ref 40–70)
PLATELET: 226 10*3/uL (ref 140–440)
RBC: 5.05 M/uL (ref 4.10–5.10)
RDW: 17.3 % — ABNORMAL HIGH (ref 11.5–14.5)
WBC: 5 10*3/uL (ref 4.5–13.0)

## 2017-01-10 LAB — METABOLIC PANEL, COMPREHENSIVE
A-G Ratio: 1 (ref 0.8–1.7)
ALT (SGPT): 19 U/L (ref 13–56)
AST (SGOT): 14 U/L — ABNORMAL LOW (ref 15–37)
Albumin: 4 g/dL (ref 3.4–5.0)
Alk. phosphatase: 82 U/L (ref 45–117)
Anion gap: 6 mmol/L (ref 3.0–18)
BUN/Creatinine ratio: 13 (ref 12–20)
BUN: 10 MG/DL (ref 7.0–18)
Bilirubin, total: 0.2 MG/DL (ref 0.2–1.0)
CO2: 25 mmol/L (ref 21–32)
Calcium: 9.4 MG/DL (ref 8.5–10.1)
Chloride: 109 mmol/L — ABNORMAL HIGH (ref 100–108)
Creatinine: 0.78 MG/DL (ref 0.6–1.3)
GFR est AA: >60 mL/min/{1.73_m2} (ref >60)
GFR est non-AA: >60 mL/min/{1.73_m2} (ref >60)
Globulin: 4.2 g/dL — ABNORMAL HIGH (ref 2.0–4.0)
Glucose: 79 mg/dL (ref 74–99)
Potassium: 4.5 mmol/L (ref 3.5–5.5)
Protein, total: 8.2 g/dL (ref 6.4–8.2)
Sodium: 140 mmol/L (ref 136–145)

## 2017-01-10 LAB — IRON PROFILE
Iron % saturation: 17 %
Iron: 52 ug/dL (ref 50–175)
TIBC: 313 ug/dL (ref 250–450)

## 2017-01-10 LAB — FERRITIN: Ferritin: 93 NG/ML (ref 8–388)

## 2017-01-10 NOTE — Progress Notes (Signed)
Hematology/Oncology  Progress Note  Name: Melissa Leblanc  Date: 01/10/2017  DOB: Jan 26, 1966    BDZ:HGDJM M. Starleen Blue, DO     Ms. Melissa Leblanc is a 51 year old female who was seen for management of her Hodgkin's lymphoma.    Current therapy: active surveillance, the patient previously completed 8 cycles of systemic chemotherapy with the ABVD regimen (2014)  Subjective:     [name] is a 51 year old African American woman has a history of Hodgkin's lymphoma. She has previously completed a full course of systemic chemotherapy with a combination of ABVD,  In 2014. She denies fevers, night sweats. Her appetite is excellent.  Today, she is complaining of some fullness in the supraclavicular area and she has noticed mild facial edema..    Past medical history, family history, and social history: these were reviewed and remains unchanged.    Past Medical History:   Diagnosis Date   ??? Anxiety    ??? Arthritis 10/17/2015   ??? Asthma    ??? Bladder incontinence    ??? Cancer [facility])     lymph node cancer   ??? Depression 08/17/2014   ??? Diabetes (HCC)     border line diabetes   ??? GERD (gastroesophageal reflux disease)    ??? Hypertension    ??? Lymphoma ([place])    ??? Neck mass    ??? Prediabetes    ??? VAIN I (vaginal intraepithelial neoplasia grade I)      Past Surgical History:   Procedure Laterality Date   ??? HX GYN      BTL   ??? HX HYSTERECTOMY  2004   ??? HX OTHER SURGICAL      lymph node biopsies neck   ??? HX VASCULAR ACCESS      double right chest mediport   ??? PR RMVL TUN CTR VAD W/SUBQ PORT/PMP CTR/PRPH INSJ N/A 03/14/2016    Dr. Jimmye Norman     Social History     Social History   ??? Marital status: SINGLE     Spouse name: N/A   ??? Number of children: N/A   ??? Years of education: N/A     Occupational History   ??? Not on file.     Social History Main Topics   ??? Smoking status: Current Every Day Smoker     Packs/day: 0.50     Years: 28.00   ??? Smokeless tobacco: Never Used   ??? Alcohol use 1.8 - 2.4 oz/week     3 - 4 Glasses of wine per week       Comment: every 3 to 4 days   ??? Drug use: Yes     Special: Marijuana      Comment: one or 2 puffs occasionally (none now as of 03/11/16 per patient)   ??? Sexual activity: No     Other Topics Concern   ??? Not on file     Social History Narrative     Family History   Problem Relation Age of Onset   ??? Cancer Mother      throat   ??? Breast Cancer Mother    ??? Colon Cancer Mother    ??? Hypertension Mother    ??? Colon Cancer Father    ??? Pacemaker Father    ??? Cancer Sister      thigh that moved to lung, sounds like myosarcoma?   ??? Hypertension Sister    ??? Alcohol abuse Neg Hx    ??? Arthritis-rheumatoid Neg Hx    ???  Asthma Neg Hx    ??? Bleeding Prob Neg Hx    ??? Diabetes Neg Hx    ??? Elevated Lipids Neg Hx    ??? Headache Neg Hx    ??? Lung Disease Neg Hx    ??? Migraines Neg Hx    ??? Psychiatric Disorder Neg Hx    ??? Stroke Neg Hx    ??? Mental Retardation Neg Hx      Current Outpatient Prescriptions   Medication Sig Dispense Refill   ??? lisinopril-hydroCHLOROthiazide (PRINZIDE, ZESTORETIC) 20-25 mg per tablet take 1 tablet by mouth once daily 90 Tab 1   ??? furosemide (LASIX) 20 mg tablet Take as needed daily for leg swelling. 30 Tab 2   ??? cyclobenzaprine (FLEXERIL) 5 mg tablet Take 1 Tab by mouth three (3) times daily as needed for Muscle Spasm(s). 90 Tab 3       Review of Systems  Constitutional: The patient has no acute distress or discomfort.  HEENT: The patient denies recent head trauma, eye pain, blurred vision,  hearing deficit, oropharyngeal mucosal pain or lesions, and the patient denies throat pain or discomfort.  Lymphatics: The patient denies palpable peripheral lymphadenopathy.  Hematologic: The patient denies having bruising, bleeding, or progressive fatigue.  Respiratory: the patient is complaining of some cough, low-grade fever and running nose.  Cardiovascular: The patient denies having leg pain, leg swelling, heart palpitations, chest permit, chest pain, or lightheadedness.  The patient denies having dyspnea on exertion.   Gastrointestinal: The patient denies having nausea, emesis, or diarrhea. The patient denies having any hematemesis or blood in the stool.  Genitourinary: Patient denies having urinary urgency, frequency, or dysuria.  The patient denies having blood in the urine.  Psychological: The patient denies having symptoms of nervousness, anxiety, depression, or thoughts of harming herself  Or anybody .  Skin: Patient denies having skin rashes, skin, ulcerations, or unexplained itching or pruritus.  Musculoskeletal: The patient denies having pain in the joints or bones.      Objective:     Visit Vitals   ??? BP 134/83   ??? Pulse 71   ??? Temp 98.2 ??F (36.8 ??C) (Oral)   ??? Wt 95.7 kg (211 lb)   ??? BMI 35.11 kg/m2     ECOG PS=0, pain score=0/10    Physical Exam:   Gen. Appearance: The patient is in no acute distress.  Skin: There is no bruise or rash.  HEENT: The exam is unremarkable, except for mild facial puffiness.  Neck: Supple without discrete lymphadenopathy but some fullness in his supraclavicular area is present.  Lungs: Clear to auscultation and percussion; there are no wheezes or rhonchi.  Heart: Regular rate and rhythm; there are no murmurs, gallops, or rubs.  Abdomen: Bowel sounds are present and normal.  There is no guarding, tenderness, or hepatosplenomegaly.  Extremities: There is no clubbing, cyanosis, or edema.  Neurologic: There are no focal neurologic deficits.  Lymphatics: There is no palpable peripheral lymphadenopathy. Musculoskeletal: The patient has full range of motion at all joints.  There is no evidence of joint deformity or effusions.  There is no focal joint tenderness.  Psychological/psychiatric: There is no clinical evidence of anxiety, depression, or melancholy.    Lab data:      Results for orders placed or performed during the hospital encounter of 01/10/17   CBC WITH 3 PART DIFF     Status: Abnormal   Result Value Ref Range Status    WBC 5.0 4.5 -  13.0 K/uL Final    RBC 5.05 4.10 - 5.10 M/uL Final     HGB 13.1 12.0 - 16.0 g/dL Final    HCT 39.8 36 - 48 % Final    MCV 78.8 78 - 102 FL Final    MCH 25.9 25.0 - 35.0 PG Final    MCHC 32.9 31 - 37 g/dL Final    RDW 17.3 (H) 11.5 - 14.5 % Final    PLATELET 226 140 - 440 K/uL Final    NEUTROPHILS 47 40 - 70 % Final    MIXED CELLS 7 0.1 - 17 % Final    LYMPHOCYTES 47 (H) 14 - 44 % Final    ABS. NEUTROPHILS 2.4 1.8 - 9.5 K/UL Final    ABS. MIXED CELLS 0.3 0.0 - 2.3 K/uL Final    ABS. LYMPHOCYTES 2.3 1.1 - 5.9 K/UL Final     Comment: Test performed at [facility]. Results Reviewed by Medical Director.    DF AUTOMATED   Final           Assessment:     1. Hodgkin's disease of head, face, and neck ([place])    2. Chronic leukopenia    3. Chronic anemia    4. Arthritis      Plan:   Hodgkin's lymphoma: This time I have explained to the patient will need to schedule CT scans of the neck, chest, abdomen, and pelvis to assess for any evidence of recurrent disease.  She has not had follow-up since June 2017.  A comprehensive metabolic panel and sedimentation rate will also be ordered.    Leukopenia: The current CBC shows her WBC today normal at 5 with an absolute neutrophil count of 2.4 and absolute lymphocyte count of 2.3.  I have explained to the patient that her leukopenia has remained resolved.    Iron deficiency anemia: The patient has continued to take the over-the-counter iron supplement once daily. Her current hemoglobin is 13.1 g/dL with hematocrit of 39.8 %.  I recommended that she continue to take the over-the-counter iron supplement, ferrous sulfate 1 tablet daily.    Chronic arthritis : I have recommended that the patient continue to use Aleve 1 tablet twice daily.   She has previously been provided Percocet in the past for severe pain.     The patient will follow-up in the clinic in about 3 weeks to review the imaging studies and to discuss potential options of management if needed.     Orders Placed This Encounter    ??? COMPLETE CBC & AUTO DIFF WBC   ??? InHouse CBC ([facility])     Standing Status:   Future     Number of Occurrences:   1     Standing Expiration Date:   03/27/3809   ??? METABOLIC PANEL, COMPREHENSIVE     Standing Status:   Future     Standing Expiration Date:   01/11/2018   ??? IRON PROFILE     Standing Status:   Future     Standing Expiration Date:   01/11/2018   ??? FERRITIN     Standing Status:   Future     Standing Expiration Date:   01/11/2018   ??? SED RATE (ESR)     Standing Status:   Future     Standing Expiration Date:   01/11/2018       Suzy Bouchard, MD  01/10/2017

## 2017-01-10 NOTE — Patient Instructions (Signed)
Complete Blood Count (CBC): About This Test  What is it?    A complete blood count (CBC) is a blood test that gives important information about your blood cells, especially red blood cells, white blood cells, and platelets.  Why is this test done?  A CBC may be done as part of a regular physical exam. There are many other reasons that a doctor may want this blood test, including to:  ?? Find the cause of symptoms such as fatigue, weakness, fever, bruising, or weight loss.  ?? Find anemia or an infection.  ?? See how much blood has been lost if there is bleeding.  ?? Diagnose diseases of the blood, such as leukemia or polycythemia.  How can you prepare for the test?  You do not need to do anything before having this test.  What happens during the test?  The health professional taking a sample of your blood will:  ?? Wrap an elastic band around your upper arm. This makes the veins below the band larger so it is easier to put a needle into the vein.  ?? Clean the needle site with alcohol.  ?? Put the needle into the vein.  ?? Attach a tube to the needle to fill it with blood.  ?? Remove the band from your arm when enough blood is collected.  ?? Put a gauze pad or cotton ball over the needle site as the needle is removed.  ?? Put pressure on the site and then put on a bandage.  If this blood test is done on a baby, a heel stick may be done instead of a blood draw from a vein.  What happens after the test?  ?? You will probably be able to go home right away.  ?? You can go back to your usual activities right away.  Follow-up care is a key part of your treatment and safety. Be sure to make and go to all appointments, and call your doctor if you are having problems. It's also a good idea to keep a list of the medicines you take. Ask your doctor when you can expect to have your test results.  Where can you learn more?  Go to http://www.healthwise.net/[employer].   Enter Q692 in the search box to learn more about "Complete Blood Count (CBC): About This Test."  Current as of: June 02, 2015  Content Version: 11.4  ?? 2006-2017 [employer]. Care instructions adapted under license by [employer] (which disclaims liability or warranty for this information). If you have questions about a medical condition or this instruction, always ask your healthcare professional. [employer], Inc[employer] disclaims any warranty or liability for your use of this information.

## 2017-01-11 LAB — SED RATE (ESR): Sed rate, automated: 20 mm/hr (ref 0–20)

## 2017-01-16 ENCOUNTER — Ambulatory Visit: Admit: 2017-01-16 | Discharge: 2017-01-16 | Payer: MEDICARE | Attending: Family | Primary: Family

## 2017-01-16 DIAGNOSIS — L0231 Cutaneous abscess of buttock: Secondary | ICD-10-CM

## 2017-01-16 MED ORDER — MUPIROCIN 2 % OINTMENT
2 % | Freq: Every day | CUTANEOUS | 0 refills | Status: DC
Start: 2017-01-16 — End: 2017-04-18

## 2017-01-16 NOTE — Patient Instructions (Signed)
DASH Diet: Care Instructions  Your Care Instructions    The DASH diet is an eating plan that can help lower your blood pressure. DASH stands for Dietary Approaches to Stop Hypertension. Hypertension is high blood pressure.  The DASH diet focuses on eating foods that are high in calcium, potassium, and magnesium. These nutrients can lower blood pressure. The foods that are highest in these nutrients are fruits, vegetables, low-fat dairy products, nuts, seeds, and legumes. But taking calcium, potassium, and magnesium supplements instead of eating foods that are high in those nutrients does not have the same effect. The DASH diet also includes whole grains, fish, and poultry.  The DASH diet is one of several lifestyle changes your doctor may recommend to lower your high blood pressure. Your doctor may also want you to decrease the amount of sodium in your diet. Lowering sodium while following the DASH diet can lower blood pressure even further than just the DASH diet alone.  Follow-up care is a key part of your treatment and safety. Be sure to make and go to all appointments, and call your doctor if you are having problems. It's also a good idea to know your test results and keep a list of the medicines you take.  How can you care for yourself at home?  Following the DASH diet  ?? Eat 4 to 5 servings of fruit each day. A serving is 1 medium-sized piece of fruit, ?? cup chopped or canned fruit, 1/4 cup dried fruit, or 4 ounces (?? cup) of fruit juice. Choose fruit more often than fruit juice.  ?? Eat 4 to 5 servings of vegetables each day. A serving is 1 cup of lettuce or raw leafy vegetables, ?? cup of chopped or cooked vegetables, or 4 ounces (?? cup) of vegetable juice. Choose vegetables more often than vegetable juice.  ?? Get 2 to 3 servings of low-fat and fat-free dairy each day. A serving is 8 ounces of milk, 1 cup of yogurt, or 1 ?? ounces of cheese.   ?? Eat 6 to 8 servings of grains each day. A serving is 1 slice of bread, 1 ounce of dry cereal, or ?? cup of cooked rice, pasta, or cooked cereal. Try to choose whole-grain products as much as possible.  ?? Limit lean meat, poultry, and fish to 2 servings each day. A serving is 3 ounces, about the size of a deck of cards.  ?? Eat 4 to 5 servings of nuts, seeds, and legumes (cooked dried beans, lentils, and split peas) each week. A serving is 1/3 cup of nuts, 2 tablespoons of seeds, or ?? cup of cooked beans or peas.  ?? Limit fats and oils to 2 to 3 servings each day. A serving is 1 teaspoon of vegetable oil or 2 tablespoons of salad dressing.  ?? Limit sweets and added sugars to 5 servings or less a week. A serving is 1 tablespoon jelly or jam, ?? cup sorbet, or 1 cup of lemonade.  ?? Eat less than 2,300 milligrams (mg) of sodium a day. If you limit your sodium to 1,500 mg a day, you can lower your blood pressure even more.  Tips for success  ?? Start small. Do not try to make dramatic changes to your diet all at once. You might feel that you are missing out on your favorite foods and then be more likely to not follow the plan. Make small changes, and stick with them. Once those changes become habit, add a   few more changes.  ?? Try some of the following:  ?? Make it a goal to eat a fruit or vegetable at every meal and at snacks. This will make it easy to get the recommended amount of fruits and vegetables each day.  ?? Try yogurt topped with fruit and nuts for a snack or healthy dessert.  ?? Add lettuce, tomato, cucumber, and onion to sandwiches.  ?? Combine a ready-made pizza crust with low-fat mozzarella cheese and lots of vegetable toppings. Try using tomatoes, squash, spinach, broccoli, carrots, cauliflower, and onions.  ?? Have a variety of cut-up vegetables with a low-fat dip as an appetizer instead of chips and dip.  ?? Sprinkle sunflower seeds or chopped almonds over salads. Or try adding  chopped walnuts or almonds to cooked vegetables.  ?? Try some vegetarian meals using beans and peas. Add garbanzo or kidney beans to salads. Make burritos and tacos with mashed pinto beans or black beans.  Where can you learn more?  Go to http://www.healthwise.net/GoodHelpConnections.  Enter H967 in the search box to learn more about "DASH Diet: Care Instructions."  Current as of: May 10, 2015  Content Version: 11.4  ?? 2006-2017 [employer]. Care instructions adapted under license by [employer] (which disclaims liability or warranty for this information). If you have questions about a medical condition or this instruction, always ask your healthcare professional. [employer] disclaims any warranty or liability for your use of this information.

## 2017-01-16 NOTE — Progress Notes (Signed)
1. Have you been to the ER, urgent care clinic since your last visit?  Hospitalized since your last visit?No    2. Have you seen or consulted any other health care providers outside of the [facility] since your last visit?  Include any pap smears or colon screening. No

## 2017-01-16 NOTE — Progress Notes (Signed)
HISTORY OF PRESENT ILLNESS  Melissa Leblanc is a 51 y.o. female.  HPI Comments: Skin abscess/boil to buttock x 3 months. Reports area is leaking bloody drainage. She did not take the antibiotics that was prescribed last month. Reports she has not tried any treatment for this.   HTN: BP stable/improving. Reports med compliance. Denies any leg swelling or palpitations. BP is not measured at home.     Hypertension    The history is provided by the patient. This is a chronic problem. The current episode started more than 1 week ago. The problem has been gradually improving. Risk factors include smoking/tobacco exposure and obesity.   Skin Problem   The history is provided by the patient. This is a recurrent problem. The current episode started more than 1 week ago. The problem occurs daily. The problem has not changed since onset.She has tried nothing for the symptoms.       Review of Systems   Constitutional: Negative.    Respiratory: Negative.    Cardiovascular: Negative.    Genitourinary: Negative.    Skin:        Skin abscess       Past Medical History:   Diagnosis Date   ??? Anxiety    ??? Arthritis 10/17/2015   ??? Asthma    ??? Bladder incontinence    ??? Cancer [facility])     lymph node cancer   ??? Depression 08/17/2014   ??? Diabetes (HCC)     border line diabetes   ??? GERD (gastroesophageal reflux disease)    ??? Hypertension    ??? Lymphoma ([place])    ??? Neck mass    ??? Prediabetes    ??? VAIN I (vaginal intraepithelial neoplasia grade I)      Past Surgical History:   Procedure Laterality Date   ??? HX GYN      BTL   ??? HX HYSTERECTOMY  2004   ??? HX OTHER SURGICAL      lymph node biopsies neck   ??? HX VASCULAR ACCESS      double right chest mediport   ??? PR RMVL TUN CTR VAD W/SUBQ PORT/PMP CTR/PRPH INSJ N/A 03/14/2016    Dr. Jimmye Norman     Current Outpatient Prescriptions on File Prior to Visit   Medication Sig Dispense Refill   ??? lisinopril-hydroCHLOROthiazide (PRINZIDE, ZESTORETIC) 20-25 mg per tablet take 1 tablet by mouth once daily 90 Tab 1    ??? furosemide (LASIX) 20 mg tablet Take as needed daily for leg swelling. 30 Tab 2   ??? cyclobenzaprine (FLEXERIL) 5 mg tablet Take 1 Tab by mouth three (3) times daily as needed for Muscle Spasm(s). 90 Tab 3     No current facility-administered medications on file prior to visit.        Allergies and Intolerances:   Allergies   Allergen Reactions   ??? Vicodin [Hydrocodone-Acetaminophen] Itching       Family History:   Family History   Problem Relation Age of Onset   ??? Cancer Mother      throat   ??? Breast Cancer Mother    ??? Colon Cancer Mother    ??? Hypertension Mother    ??? Colon Cancer Father    ??? Pacemaker Father    ??? Cancer Sister      thigh that moved to lung, sounds like myosarcoma?   ??? Hypertension Sister    ??? Alcohol abuse Neg Hx    ??? Arthritis-rheumatoid Neg Hx    ???  Asthma Neg Hx    ??? Bleeding Prob Neg Hx    ??? Diabetes Neg Hx    ??? Elevated Lipids Neg Hx    ??? Headache Neg Hx    ??? Lung Disease Neg Hx    ??? Migraines Neg Hx    ??? Psychiatric Disorder Neg Hx    ??? Stroke Neg Hx    ??? Mental Retardation Neg Hx        Social History:   She  reports that she has been smoking.  She has a 14.00 pack-year smoking history. She has never used smokeless tobacco. She  reports that she drinks about 1.8 - 2.4 oz of alcohol per week   Vitals:   Visit Vitals   ??? BP 132/86   ??? Pulse 68   ??? Temp 96.7 ??F (35.9 ??C)   ??? Resp 14   ??? Ht 5\' 5"  (1.651 m)   ??? Wt 210 lb (95.3 kg)   ??? SpO2 99%   ??? BMI 34.95 kg/m2     Body surface area is 2.09 meters squared.    Physical Exam   Constitutional: She is oriented to person, place, and time. She appears well-developed and well-nourished.   HENT:   Head: Atraumatic.   Pulmonary/Chest: Effort normal.   Neurological: She is alert and oriented to person, place, and time.   Psychiatric: She has a normal mood and affect. Her behavior is normal.   Nursing note and vitals reviewed.      ASSESSMENT and PLAN    ICD-10-CM ICD-9-CM    1. Cutaneous abscess of buttock L02.31 682.5 mupirocin (BACTROBAN) 2 %  ointment   2. HTN, goal below 130/80 I10 401.9      Follow-up Disposition:  Return in about 3 months (around 04/18/2017), or if symptoms worsen or fail to improve, for HTN.  cardiovascular risk and specific lipid/LDL goals reviewed    - Alarm signals discussed. ER precautions  - Plan of care reviewed with patient.  Understanding verbalized and they are in agreement with plan of care.

## 2017-01-23 ENCOUNTER — Inpatient Hospital Stay: Admit: 2017-01-23 | Payer: MEDICARE | Attending: Hematology & Oncology | Primary: Family

## 2017-01-23 ENCOUNTER — Encounter

## 2017-01-23 DIAGNOSIS — C8191 Hodgkin lymphoma, unspecified, lymph nodes of head, face, and neck: Secondary | ICD-10-CM

## 2017-01-23 LAB — CREATININE, POC
Creatinine, POC: 0.7 MG/DL (ref 0.6–1.3)
GFRAA, POC: >60 mL/min/{1.73_m2} (ref >60)
GFRNA, POC: >60 mL/min/{1.73_m2} (ref >60)

## 2017-01-23 MED ORDER — IOPAMIDOL 61 % IV SOLN
300 mg iodine /mL (61 %) | Freq: Once | INTRAVENOUS | Status: AC
Start: 2017-01-23 — End: 2017-01-23
  Administered 2017-01-23: 12:00:00 via INTRAVENOUS

## 2017-01-23 MED FILL — ISOVUE-300  61 % INTRAVENOUS SOLUTION: 300 mg iodine /mL (61 %) | INTRAVENOUS | Qty: 100

## 2017-01-31 ENCOUNTER — Ambulatory Visit: Admit: 2017-01-31 | Discharge: 2017-01-31 | Payer: MEDICARE | Attending: Hematology & Oncology | Primary: Family

## 2017-01-31 DIAGNOSIS — C8191 Hodgkin lymphoma, unspecified, lymph nodes of head, face, and neck: Secondary | ICD-10-CM

## 2017-01-31 NOTE — Progress Notes (Signed)
Hematology/medical oncology progress note    01/31/2017  Melissa Leblanc  Date of birth: 04/27/1984 9    PCP: Dr. Ebony Hail    Diagnosis: History of Hodgkin's lymphoma    Ms. Melissa Leblanc is a 51 year old woman with history of Hodgkin's lymphoma.  She successfully completed treatment the ABVD regimen.  Recently, on 01/23/2017 she had CT scans of the neck, chest, abdomen, and pelvis.  I have explained to the patient that she does have some small lymph nodes in the neck but when compared to the CT scan done on 06/01/2014 some of the lymph nodes appeared to be minimally to mildly larger described but there is no evidence of new enlarged lymph nodes in the neck.  The radiologist recommended reevaluation of the CT scan six-month CT scan through the chest, and abdomen, and showed no convincing evidence lymphoma.  Her sedimentation rate from 01/10/2017 was normal at 20.  I have explained to the patient that we will be immature in about 6 months.  Her CBC from 01/10/2017 showed all normal values with a WBC count of 5, hemoglobin 13.1 g/dL, hematocrit 39.8%, platelet count 226,000.  The patient had her questions answered to her satisfaction.  I will plan to see her back in clinic again in about 4 months with plans to repeat the imaging studies in 6 months.  Total time 30 minutes, greater than 50% of the time was in counseling and coordination of care.    Melissa Leblanc A.Nadyne Coombes, MD, [facility]

## 2017-01-31 NOTE — Patient Instructions (Signed)
Hodgkin's: Care Instructions  Your Care Instructions    Hodgkin's is a type of cancer that affects part of the immune system (lymph system). Cells normally found in the lymph nodes and spleen can increase in number and collect in areas where they cause problems. Hodgkin's, also called Hodgkin's lymphoma, is not contagious and is not caused by an injury.  Treatment for Hodgkin's depends on the stage of the lymphoma and what type of lymphoma you have. It is usually treated with medicines called chemotherapy. You may also need radiation treatments or a procedure called a bone marrow transplant. Your doctor will talk to you about what kind of treatment may be best for you.  When you find out that you have cancer, you may feel many emotions and may need some help coping. Seek out family, friends, and counselors for support. You also can do things at home to make yourself feel better while you go through treatment. Call the [facility] [phone]) or visit its website at [email or web address] for more information.  Follow-up care is a key part of your treatment and safety. Be sure to make and go to all appointments, and call your doctor if you are having problems. It's also a good idea to know your test results and keep a list of the medicines you take.  How can you care for yourself at home?  ?? Take your medicines exactly as prescribed. Call your doctor if you think you are having a problem with your medicine. You may get medicine for nausea and vomiting if you have these side effects.  ?? Eat healthy food. If you do not feel like eating, try to eat food that has protein and extra calories to keep up your strength and prevent weight loss. Drink liquid meal replacements for extra calories and protein. Try to eat your main meal early.  ?? Get some physical activity every day, but do not get too tired. Keep doing the hobbies you enjoy as your energy allows.   ?? Take steps to control your stress and workload. Learn relaxation techniques.  ?? Share your feelings. Stress and tension affect our emotions. By expressing your feelings to others, you may be able to understand and cope with them.  ?? Consider joining a support group. Talking about a problem with your spouse, a good friend, or other people with similar problems is a good way to reduce tension and stress.  ?? Express yourself through art. Try writing, crafts, dance, or art to relieve stress. Some dance, writing, or art groups may be available just for people who have cancer.  ?? Be kind to your body and mind. Getting enough sleep, eating a healthy diet, and taking time to do things you enjoy can contribute to an overall feeling of balance in your life and can help reduce stress.  ?? Get help if you need it. Discuss your concerns with your doctor or counselor.  ?? If you are vomiting or have diarrhea:  ?? Drink plenty of fluids (enough so that your urine is light yellow or clear like water) to prevent dehydration. Choose water and other caffeine-free clear liquids. If you have kidney, heart, or liver disease and have to limit fluids, talk with your doctor before you increase the amount of fluids you drink.  ?? When you are able to eat, try clear soups, mild foods, and liquids until all symptoms are gone for 12 to 48 hours. Other good choices include dry toast, crackers, cooked cereal, and gelatin  dessert, such as Jell-O.  ?? If you have not already done so, prepare a list of advance directives. Advance directives are instructions to your doctor and family members about what kind of care you want if you become unable to speak or express yourself.  When should you call for help?  Call 911 anytime you think you may need emergency care. For example, call if:  ? ?? You passed out (lost consciousness).   ?Call your doctor now or seek immediate medical care if:  ? ?? You have a fever.   ? ?? You have abnormal bleeding.    ? ?? You have new or worse pain.   ? ?? You think you have an infection.   ? ?? You have new symptoms, such as a cough, belly pain, vomiting, diarrhea, or a rash.   ?Watch closely for changes in your health, and be sure to contact your doctor if:  ? ?? You are much more tired than usual.   ? ?? You have swollen glands in your armpits, groin, or neck.   ? ?? You do not get better as expected.   Where can you learn more?  Go to [email or web address].  Enter [phone] in the search box to learn more about "Hodgkin's: Care Instructions."  Current as of: Dec 29, 2015  Content Version: 11.4  ?? 2006-2017 Healthwise, Incorporated. Care instructions adapted under license by [employer] (which disclaims liability or warranty for this information). If you have questions about a medical condition or this instruction, always ask your healthcare professional. [place] any warranty or liability for your use of this information.

## 2017-04-18 ENCOUNTER — Ambulatory Visit: Admit: 2017-04-18 | Discharge: 2017-04-18 | Attending: Family Medicine | Primary: Family

## 2017-04-18 DIAGNOSIS — I10 Essential (primary) hypertension: Secondary | ICD-10-CM

## 2017-04-18 DIAGNOSIS — R600 Localized edema: Secondary | ICD-10-CM | POA: Insufficient documentation

## 2017-04-18 DIAGNOSIS — M25473 Effusion, unspecified ankle: Secondary | ICD-10-CM | POA: Insufficient documentation

## 2017-04-18 DIAGNOSIS — E669 Obesity, unspecified: Secondary | ICD-10-CM | POA: Insufficient documentation

## 2017-04-18 MED ORDER — FUROSEMIDE 20 MG TAB
20 mg | ORAL_TABLET | Freq: Every day | ORAL | 3 refills | Status: DC | PRN
Start: 2017-04-18 — End: 2017-08-26

## 2017-04-18 MED ORDER — POTASSIUM CHLORIDE SR 10 MEQ TAB, PARTICLES/CRYSTALS
10 mEq | ORAL_TABLET | Freq: Every day | ORAL | 3 refills | Status: DC
Start: 2017-04-18 — End: 2017-08-26

## 2017-04-18 NOTE — Progress Notes (Signed)
Melissa Leblanc is a 51 y.o. female  Presented in clinic today for   Chief Complaint   Patient presents with   ??? Hypertension   1. Have you been to the ER, urgent care clinic since your last visit?  Hospitalized since your last visit?No    2. Have you seen or consulted any other health care providers outside of the [facility] since your last visit?  Include any pap smears or colon screening. No

## 2017-04-18 NOTE — Progress Notes (Signed)
HISTORY OF PRESENT ILLNESS  Melissa Leblanc is a 51 y.o. female.  HPI Comments: Seen in follow-up for HTN, pedal edema, DJD, urge incontinence, obesity. No problems with medications. She has been having muscle cramps, and she isn't sure whether Flexeril helps her. She needs a new prescription for medium adult pull-ups (3 per day). She is due for a colonoscopy (mother had colon cancer). She is followed by Dr. 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     Hypertension    Pertinent negatives include no chest pain, no palpitations, no headaches, no shortness of breath, no nausea and no vomiting.       Past Medical History:   Diagnosis Date   ??? Anxiety    ??? Arthritis 10/17/2015   ??? Asthma    ??? Bladder incontinence    ??? Cancer [facility])     lymph node cancer   ??? Depression 08/17/2014   ??? Diabetes (HCC)     border line diabetes   ??? GERD (gastroesophageal reflux disease)    ??? Hypertension    ??? Lymphoma ([place])    ??? Neck mass    ??? Prediabetes    ??? VAIN I (vaginal intraepithelial neoplasia grade I)        Past Surgical History:   Procedure Laterality Date   ??? HX GYN      BTL   ??? HX HYSTERECTOMY  2004   ??? HX OTHER SURGICAL      lymph node biopsies neck   ??? HX VASCULAR ACCESS      double right chest mediport   ??? PR RMVL TUN CTR VAD W/SUBQ PORT/PMP CTR/PRPH INSJ N/A 03/14/2016    Dr. Jimmye Norman       History   Smoking Status   ??? Current Every Day Smoker   ??? Packs/day: 0.50   ??? Years: 28.00   Smokeless Tobacco   ??? Never Used     Current Outpatient Prescriptions   Medication Sig   ??? lisinopril-hydroCHLOROthiazide (PRINZIDE, ZESTORETIC) 20-25 mg per tablet take 1 tablet by mouth once daily   ??? furosemide (LASIX) 20 mg tablet Take as needed daily for leg swelling.   ??? cyclobenzaprine (FLEXERIL) 5 mg tablet Take 1 Tab by mouth three (3) times daily as needed for Muscle Spasm(s).     No current facility-administered medications for this visit.        Review of Systems    Constitutional: Negative for chills and fever.   Respiratory: Negative for shortness of breath.    Cardiovascular: Negative for chest pain, palpitations and leg swelling.   Gastrointestinal: Negative for nausea and vomiting.   Neurological: Positive for tingling (in feet sometimes). Negative for focal weakness and headaches.   Psychiatric/Behavioral: The patient does not have insomnia.      Visit Vitals   ??? BP 120/73 (BP 1 Location: Right arm, BP Patient Position: Sitting)   ??? Pulse 91   ??? Temp 98 ??F (36.7 ??C) (Oral)   ??? Resp 18   ??? Ht 5\' 5"  (1.651 m)   ??? Wt 203 lb (92.1 kg)   ??? SpO2 100%   ??? BMI 33.78 kg/m2       Physical Exam   Constitutional: She is oriented to person, place, and time. She appears well-developed and well-nourished. No distress.   Neck: Neck supple. No thyromegaly present.   Carotid bruit absent   Cardiovascular: Normal rate, regular rhythm and intact distal pulses.  Exam reveals  no gallop and no friction rub.    No murmur heard.  Pulmonary/Chest: Effort normal and breath sounds normal. No respiratory distress.   Musculoskeletal: She exhibits no edema.   Lymphadenopathy:     She has no cervical adenopathy.   Neurological: She is alert and oriented to person, place, and time.   Skin: Skin is warm and dry.   Psychiatric: She has a normal mood and affect. Her behavior is normal. Judgment and thought content normal.   Nursing note and vitals reviewed.      ASSESSMENT and PLAN    ICD-10-CM ICD-9-CM    1. Essential hypertension I10 401.9 potassium chloride (KLOR-CON) 10 mEq tablet   2. Urge incontinence N39.41 788.31 AMB SUPPLY ORDER   3. Arthritis M19.90 716.90    4. Hodgkin's disease of head, face, and neck ([place]) C81.91 201.91    5. Encounter for screening for malignant neoplasm of colon Z12.11 V76.51 REFERRAL TO COLON AND RECTAL SURGERY   6. Pedal edema R60.0 782.3 furosemide (LASIX) 20 mg tablet   7. Class 1 obesity due to excess calories with serious comorbidity and  body mass index (BMI) of 33.0 to 33.9 in adult E66.09 278.00     Z68.33 V85.33      Follow-up Disposition:  Return in about 3 months (around 07/18/2017).  the following changes in treatment are made: Add KCl to prevent potassium depletion. Refills as noted.  Referral for colonoscopy.  Incontinence supplies ordered.  reviewed diet, exercise and weight control  very strongly urged to quit smoking to reduce cardiovascular risk  reviewed medications and side effects in detail    Discussed the patient's BMI with her.  The BMI follow up plan is as follows:     dietary management education, guidance, and counseling  encourage exercise  monitor weight  prescribed dietary intake    An After Visit Summary was printed and given to the patient.    Plan of care reviewed - patient verbalize(s) understanding and agreement.

## 2017-04-18 NOTE — Patient Instructions (Addendum)
Body Mass Index: Care Instructions  Your Care Instructions    Body mass index (BMI) can help you see if your weight is raising your risk for health problems. It uses a formula to compare how much you weigh with how tall you are.  ?? A BMI lower than 18.5 is considered underweight.  ?? A BMI between 18.5 and 24.9 is considered healthy.  ?? A BMI between 25 and 29.9 is considered overweight. A BMI of 30 or higher is considered obese.  If your BMI is in the normal range, it means that you have a lower risk for weight-related health problems. If your BMI is in the overweight or obese range, you may be at increased risk for weight-related health problems, such as high blood pressure, heart disease, stroke, arthritis or joint pain, and diabetes. If your BMI is in the underweight range, you may be at increased risk for health problems such as fatigue, lower protection (immunity) against illness, muscle loss, bone loss, hair loss, and hormone problems.  BMI is just one measure of your risk for weight-related health problems. You may be at higher risk for health problems if you are not active, you eat an unhealthy diet, or you drink too much alcohol or use tobacco products.  Follow-up care is a key part of your treatment and safety. Be sure to make and go to all appointments, and call your doctor if you are having problems. It's also a good idea to know your test results and keep a list of the medicines you take.  How can you care for yourself at home?  ?? Practice healthy eating habits. This includes eating plenty of fruits, vegetables, whole grains, lean protein, and low-fat dairy.  ?? If your doctor recommends it, get more exercise. Walking is a good choice. Bit by bit, increase the amount you walk every day. Try for at least 30 minutes on most days of the week.  ?? Do not smoke. Smoking can increase your risk for health problems. If you need help quitting, talk to your doctor about stop-smoking programs and  medicines. These can increase your chances of quitting for good.  ?? Limit alcohol to 2 drinks a day for men and 1 drink a day for women. Too much alcohol can cause health problems.  If you have a BMI higher than 25  ?? Your doctor may do other tests to check your risk for weight-related health problems. This may include measuring the distance around your waist. A waist measurement of more than 40 inches in men or 35 inches in women can increase the risk of weight-related health problems.  ?? Talk with your doctor about steps you can take to stay healthy or improve your health. You may need to make lifestyle changes to lose weight and stay healthy, such as changing your diet and getting regular exercise.  If you have a BMI lower than 18.5  ?? Your doctor may do other tests to check your risk for health problems.  ?? Talk with your doctor about steps you can take to stay healthy or improve your health. You may need to make lifestyle changes to gain or maintain weight and stay healthy, such as getting more healthy foods in your diet and doing exercises to build muscle.  Where can you learn more?  Go to http://www.healthwise.net/[facility].  Enter S176 in the search box to learn more about "Body Mass Index: Care Instructions."  Current as of: June 01, 2015  Content Version: 11.4  ??   2006-2017 Healthwise, Incorporated. Care instructions adapted under license by [employer] (which disclaims liability or warranty for this information). If you have questions about a medical condition or this instruction, always ask your healthcare professional. [place] any warranty or liability for your use of this information.  Home Blood Pressure Test: About This Test  What is it?    A home blood pressure test allows you to keep track of your blood pressure at home. Blood pressure is a measure of the force of blood against the  walls of your arteries. Blood pressure readings include two numbers, such as 130/80 (say "130 over 80"). The first number is the systolic pressure. The second number is the diastolic pressure.  Why is this test done?  You may do this test at home to:  ?? Find out if you have high blood pressure.  ?? Track your blood pressure if you have high blood pressure.  ?? Track how well medicine is working to reduce high blood pressure.  ?? Check how lifestyle changes, such as weight loss and exercise, are affecting blood pressure.  How can you prepare for the test?  ?? Do not use caffeine, tobacco, or medicines known to raise blood pressure (such as nasal decongestant sprays) for at least 30 minutes before taking your blood pressure.  ?? Do not exercise for at least 30 minutes before taking your blood pressure.  What happens before the test?  Take your blood pressure while you feel comfortable and relaxed. Sit quietly with both feet on the floor for at least 5 minutes before the test.  What happens during the test?  ?? Sit with your arm slightly bent and resting on a table so that your upper arm is at the same level as your heart.  ?? Roll up your sleeve or take off your shirt to expose your upper arm.  ?? Wrap the blood pressure cuff around your upper arm so that the lower edge of the cuff is about 1 inch above the bend of your elbow.  Proceed with the following steps depending on if you are using an automatic or manual pressure monitor.  Automatic blood pressure monitors  ?? Press the on/off button on the automatic monitor and wait until the ready-to-measure "heart" symbol appears next to zero in the display window.  ?? Press the start button. The cuff will inflate and deflate by itself.  ?? Your blood pressure numbers will appear on the screen.  ?? Write your numbers in your log book, along with the date and time.  Manual blood pressure monitors  ?? Place the earpieces of a stethoscope in your ears, and place the bell of  the stethoscope over the artery, just below the cuff.  ?? Close the valve on the rubber inflating bulb.  ?? Squeeze the bulb rapidly with your opposite hand to inflate the cuff until the dial or column of mercury reads about 30 mm Hg higher than your usual systolic pressure. If you do not know your usual pressure, inflate the cuff to 210 mm Hg or until the pulse at your wrist disappears.  ?? Open the pressure valve just slightly by twisting or pressing the valve on the bulb.  ?? As you watch the pressure slowly fall, note the level on the dial at which you first start to hear a pulsing or tapping sound through the stethoscope. This is your systolic blood pressure.  ?? Continue letting the air out slowly. The sounds will  become muffled and will finally disappear. Note the pressure when the sounds completely disappear. This is your diastolic blood pressure. Let out all the remaining air.  ?? Write your numbers in your log book, along with the date and time.  What else should you know about the test?  Here are the categories of blood pressure for adults:  Ideal blood pressure.   Systolic is less than 284, and diastolic is less than 80.  Elevated blood pressure.   Systolic is 132 to 440, and diastolic is less than 80.  High blood pressure (hypertension).   Systolic is 102 or above. Diastolic is 80 or above. One or both numbers may be high.  It is more accurate to take the average of several readings made throughout the day than to rely on a single reading.  Follow-up care is a key part of your treatment and safety. Be sure to make and go to all appointments, and call your doctor if you are having problems. It's also a good idea to keep a list of the medicines you take.  Where can you learn more?  Go to [email or web address].  Enter C427 in the search box to learn more about "Home Blood Pressure Test: About This Test."  Current as of: July 24, 2016  Content Version: 11.7   ?? 2006-2018 Healthwise, Incorporated. Care instructions adapted under license by [employer] (which disclaims liability or warranty for this information). If you have questions about a medical condition or this instruction, always ask your healthcare professional. [place] any warranty or liability for your use of this information.  ??       Home Blood Pressure Test: About This Test  What is it?    A home blood pressure test allows you to keep track of your blood pressure at home. Blood pressure is a measure of the force of blood against the walls of your arteries. Blood pressure readings include two numbers, such as 130/80 (say "130 over 80"). The first number is the systolic pressure. The second number is the diastolic pressure.  Why is this test done?  You may do this test at home to:  ?? Find out if you have high blood pressure.  ?? Track your blood pressure if you have high blood pressure.  ?? Track how well medicine is working to reduce high blood pressure.  ?? Check how lifestyle changes, such as weight loss and exercise, are affecting blood pressure.  How can you prepare for the test?  ?? Do not use caffeine, tobacco, or medicines known to raise blood pressure (such as nasal decongestant sprays) for at least 30 minutes before taking your blood pressure.  ?? Do not exercise for at least 30 minutes before taking your blood pressure.  What happens before the test?  Take your blood pressure while you feel comfortable and relaxed. Sit quietly with both feet on the floor for at least 5 minutes before the test.  What happens during the test?  ?? Sit with your arm slightly bent and resting on a table so that your upper arm is at the same level as your heart.  ?? Roll up your sleeve or take off your shirt to expose your upper arm.  ?? Wrap the blood pressure cuff around your upper arm so that the lower edge of the cuff is about 1 inch above the bend of your elbow.   Proceed with the following steps depending on if you are using an automatic  or manual pressure monitor.  Automatic blood pressure monitors  ?? Press the on/off button on the automatic monitor and wait until the ready-to-measure "heart" symbol appears next to zero in the display window.  ?? Press the start button. The cuff will inflate and deflate by itself.  ?? Your blood pressure numbers will appear on the screen.  ?? Write your numbers in your log book, along with the date and time.  Manual blood pressure monitors  ?? Place the earpieces of a stethoscope in your ears, and place the bell of the stethoscope over the artery, just below the cuff.  ?? Close the valve on the rubber inflating bulb.  ?? Squeeze the bulb rapidly with your opposite hand to inflate the cuff until the dial or column of mercury reads about 30 mm Hg higher than your usual systolic pressure. If you do not know your usual pressure, inflate the cuff to 210 mm Hg or until the pulse at your wrist disappears.  ?? Open the pressure valve just slightly by twisting or pressing the valve on the bulb.  ?? As you watch the pressure slowly fall, note the level on the dial at which you first start to hear a pulsing or tapping sound through the stethoscope. This is your systolic blood pressure.  ?? Continue letting the air out slowly. The sounds will become muffled and will finally disappear. Note the pressure when the sounds completely disappear. This is your diastolic blood pressure. Let out all the remaining air.  ?? Write your numbers in your log book, along with the date and time.  What else should you know about the test?  Here are the categories of blood pressure for adults:  Ideal blood pressure.   Systolic is less than 182, and diastolic is less than 80.  Elevated blood pressure.   Systolic is 993 to 716, and diastolic is less than 80.  High blood pressure (hypertension).   Systolic is 967 or above. Diastolic is 80 or above. One or both numbers may be high.   It is more accurate to take the average of several readings made throughout the day than to rely on a single reading.  Follow-up care is a key part of your treatment and safety. Be sure to make and go to all appointments, and call your doctor if you are having problems. It's also a good idea to keep a list of the medicines you take.  Where can you learn more?  Go to [email or web address].  Enter C427 in the search box to learn more about "Home Blood Pressure Test: About This Test."  Current as of: July 24, 2016  Content Version: 11.7  ?? 2006-2018 Healthwise, Inc[employer]. Care instructions adapted under license by [employer] (which disclaims liability or warranty for this information). If you have questions about a medical condition or this instruction, always ask your healthcare professional. [place] any warranty or liability for your use of this information.

## 2017-06-04 ENCOUNTER — Encounter: Attending: Hematology & Oncology | Primary: Family

## 2017-06-06 ENCOUNTER — Encounter: Attending: Hematology & Oncology | Primary: Family

## 2017-06-17 ENCOUNTER — Encounter: Primary: Family

## 2017-07-01 ENCOUNTER — Encounter: Primary: Family

## 2017-07-15 ENCOUNTER — Encounter: Primary: Family

## 2017-07-29 ENCOUNTER — Inpatient Hospital Stay: Admit: 2017-07-29 | Primary: Family

## 2017-07-29 ENCOUNTER — Ambulatory Visit: Admit: 2017-07-29 | Discharge: 2017-07-29 | Payer: MEDICARE | Attending: Hematology & Oncology | Primary: Family

## 2017-07-29 DIAGNOSIS — D649 Anemia, unspecified: Secondary | ICD-10-CM

## 2017-07-29 DIAGNOSIS — C8191 Hodgkin lymphoma, unspecified, lymph nodes of head, face, and neck: Secondary | ICD-10-CM

## 2017-07-29 LAB — CBC WITH 3 PART DIFF
ABS. LYMPHOCYTES: 2.1 10*3/uL (ref 1.1–5.9)
ABS. MIXED CELLS: 0.3 10*3/uL (ref 0.0–2.3)
ABS. NEUTROPHILS: 2.9 10*3/uL (ref 1.8–9.5)
HCT: 38.1 % (ref 36–48)
HGB: 12.3 g/dL (ref 12.0–16.0)
LYMPHOCYTES: 40 % (ref 14–44)
MCH: 25.5 PG (ref 25.0–35.0)
MCHC: 32.3 g/dL (ref 31–37)
MCV: 79 FL (ref 78–102)
Mixed cells: 7 % (ref 0.1–17)
NEUTROPHILS: 53 % (ref 40–70)
PLATELET: 243 10*3/uL (ref 140–440)
RBC: 4.82 M/uL (ref 4.10–5.10)
RDW: 17 % — ABNORMAL HIGH (ref 11.5–14.5)
WBC: 5.3 10*3/uL (ref 4.5–13.0)

## 2017-07-29 NOTE — Progress Notes (Signed)
Hematology/Oncology  Progress Note  Name: Melissa Leblanc  Date: 07/29/2017  DOB: 18-Jun-1966    LKG:MWNUU M. Starleen Blue, DO     Melissa Leblanc is a 51 year old female who was seen for management of her Hodgkin's lymphoma.    Current therapy: active surveillance, the patient previously completed 8 cycles of systemic chemotherapy with the ABVD regimen (2014)  Subjective:     Melissa Leblanc is a 51 year old African American woman has a history of Hodgkin's lymphoma. She has previously completed a full course of systemic chemotherapy with a combination of ABVD,  In 2014. She denies fevers, night sweats. Her appetite is excellent.  Today, she is complaining of some fullness in the supraclavicular area and she has noticed mild facial edema..    Past medical history, family history, and social history: these were reviewed and remains unchanged.    Past Medical History:   Diagnosis Date   ??? Anxiety    ??? Arthritis 10/17/2015   ??? Asthma    ??? Bladder incontinence    ??? Cancer [facility])     lymph node cancer   ??? Depression 08/17/2014   ??? Diabetes (HCC)     border line diabetes   ??? GERD (gastroesophageal reflux disease)    ??? Hypertension    ??? Lymphoma ([place])    ??? Neck mass    ??? Prediabetes    ??? VAIN I (vaginal intraepithelial neoplasia grade I)      Past Surgical History:   Procedure Laterality Date   ??? HX GYN      BTL   ??? HX HYSTERECTOMY  2004   ??? HX OTHER SURGICAL      lymph node biopsies neck   ??? HX VASCULAR ACCESS      double right chest mediport   ??? PR RMVL TUN CTR VAD W/SUBQ PORT/PMP CTR/PRPH INSJ N/A 03/14/2016    Dr. Jimmye Norman     Social History     Socioeconomic History   ??? Marital status: SINGLE     Spouse name: Not on file   ??? Number of children: Not on file   ??? Years of education: Not on file   ??? Highest education level: Not on file   Social Needs   ??? Financial resource strain: Not on file   ??? Food insecurity - worry: Not on file   ??? Food insecurity - inability: Not on file   ??? Transportation needs - medical: Not on file    ??? Transportation needs - non-medical: Not on file   Occupational History   ??? Not on file   Tobacco Use   ??? Smoking status: Current Every Day Smoker     Packs/day: 0.50     Years: 28.00     Pack years: 14.00   ??? Smokeless tobacco: Never Used   Substance and Sexual Activity   ??? Alcohol use: Yes     Alcohol/week: 1.8 - 2.4 oz     Types: 3 - 4 Glasses of wine per week     Comment: every 3 to 4 days   ??? Drug use: Yes     Types: Marijuana     Comment: one or 2 puffs occasionally (none now as of 03/11/16 per patient)   ??? Sexual activity: No   Other Topics Concern   ??? Not on file   Social History Narrative   ??? Not on file     Family History   Problem Relation Age of Onset   ???  Cancer Mother         throat   ??? Breast Cancer Mother    ??? Colon Cancer Mother    ??? Hypertension Mother    ??? Colon Cancer Father    ??? Pacemaker Father    ??? Cancer Sister         thigh that moved to lung, sounds like myosarcoma?   ??? Hypertension Sister    ??? Alcohol abuse Neg Hx    ??? Arthritis-rheumatoid Neg Hx    ??? Asthma Neg Hx    ??? Bleeding Prob Neg Hx    ??? Diabetes Neg Hx    ??? Elevated Lipids Neg Hx    ??? Headache Neg Hx    ??? Lung Disease Neg Hx    ??? Migraines Neg Hx    ??? Psychiatric Disorder Neg Hx    ??? Stroke Neg Hx    ??? Mental Retardation Neg Hx      Current Outpatient Medications   Medication Sig Dispense Refill   ??? furosemide (LASIX) 20 mg tablet Take 1 Tab by mouth daily as needed. Indications: Edema 90 Tab 3   ??? potassium chloride (KLOR-CON) 10 mEq tablet Take 1 Tab by mouth daily. Indications: hypokalemia prevention 90 Tab 3   ??? lisinopril-hydroCHLOROthiazide (PRINZIDE, ZESTORETIC) 20-25 mg per tablet take 1 tablet by mouth once daily 90 Tab 1   ??? cyclobenzaprine (FLEXERIL) 5 mg tablet Take 1 Tab by mouth three (3) times daily as needed for Muscle Spasm(s). 90 Tab 3       Review of Systems  Constitutional: The patient has no acute distress or discomfort.  HEENT: The patient denies recent head trauma, eye pain, blurred vision,   hearing deficit, oropharyngeal mucosal pain or lesions, and the patient denies throat pain or discomfort.  Lymphatics: The patient denies palpable peripheral lymphadenopathy.  Hematologic: The patient denies having bruising, bleeding, or progressive fatigue.  Respiratory: the patient is complaining of some cough, low-grade fever and running nose.  Cardiovascular: The patient denies having leg pain, leg swelling, heart palpitations, chest permit, chest pain, or lightheadedness.  The patient denies having dyspnea on exertion.  Gastrointestinal: The patient denies having nausea, emesis, or diarrhea. The patient denies having any hematemesis or blood in the stool.  Genitourinary: Patient denies having urinary urgency, frequency, or dysuria.  The patient denies having blood in the urine.  Psychological: The patient denies having symptoms of nervousness, anxiety, depression, or thoughts of harming herself  Or anybody .  Skin: Patient denies having skin rashes, skin, ulcerations, or unexplained itching or pruritus.  Musculoskeletal: The patient denies having pain in the joints or bones.      Objective:     Visit Vitals  BP 117/88   Pulse 73   Temp 98.3 ??F (36.8 ??C)   Ht 5' 5"  (1.651 m)   Wt 93.2 kg (205 lb 6.4 oz)   SpO2 95%   BMI 34.18 kg/m??     ECOG PS=0, pain score=0/10    Physical Exam:   Gen. Appearance: The patient is in no acute distress.  Skin: There is no bruise or rash.  HEENT: The exam is unremarkable, except for mild facial puffiness.  Neck: Supple without discrete lymphadenopathy but some fullness in his supraclavicular area is present.  Lungs: Clear to auscultation and percussion; there are no wheezes or rhonchi.  Heart: Regular rate and rhythm; there are no murmurs, gallops, or rubs.  Abdomen: Bowel sounds are present and normal.  There is  no guarding, tenderness, or hepatosplenomegaly.  Extremities: There is no clubbing, cyanosis, or edema.  Neurologic: There are no focal neurologic deficits.  Lymphatics:  There is no palpable peripheral lymphadenopathy. Musculoskeletal: The patient has full range of motion at all joints.  There is no evidence of joint deformity or effusions.  There is no focal joint tenderness.  Psychological/psychiatric: There is no clinical evidence of anxiety, depression, or melancholy.    Lab data:      Results for orders placed or performed during the hospital encounter of 07/29/17   CBC WITH 3 PART DIFF     Status: Abnormal   Result Value Ref Range Status    WBC 5.3 4.5 - 13.0 K/uL Final    RBC 4.82 4.10 - 5.10 M/uL Final    HGB 12.3 12.0 - 16.0 g/dL Final    HCT 38.1 36 - 48 % Final    MCV 79.0 78 - 102 FL Final    MCH 25.5 25.0 - 35.0 PG Final    MCHC 32.3 31 - 37 g/dL Final    RDW 17.0 (H) 11.5 - 14.5 % Final    PLATELET 243 140 - 440 K/uL Final    NEUTROPHILS 53 40 - 70 % Final    MIXED CELLS 7 0.1 - 17 % Final    LYMPHOCYTES 40 14 - 44 % Final    ABS. NEUTROPHILS 2.9 1.8 - 9.5 K/UL Final    ABS. MIXED CELLS 0.3 0.0 - 2.3 K/uL Final    ABS. LYMPHOCYTES 2.1 1.1 - 5.9 K/UL Final     Comment: Test performed at [place] Location. Results Reviewed by Medical Director.    DF AUTOMATED   Final           Assessment:     1. Hodgkin's disease of lymph nodes of head, face, and neck ([place])    2. Chronic leukopenia    3. Chronic anemia    4. Arthritis      Plan:   Hodgkin's lymphoma: This time I have explained to the patient will need to schedule CT scans of the neck, chest, abdomen, and pelvis to assess for any evidence of recurrent disease.  She has not had follow-up since June 2017.  A comprehensive metabolic panel and sedimentation rate will also be ordered.  The most recent sedimentation rate from 01/10/2017 was normal at 20 mm an hour.    Leukopenia: The current CBC shows her WBC today normal at 5.3 with an absolute neutrophil count of 2.9 and absolute lymphocyte count of 2.1.  I have explained to the patient that her leukopenia has remained resolved.     Iron deficiency anemia: The patient has continued to take the over-the-counter iron supplement once daily. Her current hemoglobin is 12.3 g/dL with hematocrit of 38.1 %.  I recommended that she continue to take the over-the-counter iron supplement, ferrous sulfate 1 tablet daily.    Chronic arthritis : I have recommended that the patient continue to use Aleve 1 tablet twice daily.   She has previously been provided Percocet in the past for severe pain.     The patient will follow-up in the clinic in 4 months.    Orders Placed This Encounter   ??? COMPLETE CBC & AUTO DIFF WBC   ??? InHouse CBC ([facility])     Standing Status:   Future     Number of Occurrences:   1     Standing Expiration Date:   08/05/2017   ???  METABOLIC PANEL, COMPREHENSIVE     Standing Status:   Future     Standing Expiration Date:   07/30/2018   ??? IRON PROFILE     Standing Status:   Future     Standing Expiration Date:   07/30/2018   ??? FERRITIN     Standing Status:   Future     Standing Expiration Date:   07/30/2018   ??? SED RATE (ESR)     Standing Status:   Future     Standing Expiration Date:   07/30/2018       Suzy Bouchard, MD  07/29/2017

## 2017-07-30 ENCOUNTER — Inpatient Hospital Stay: Admit: 2017-07-30 | Payer: MEDICARE | Primary: Family

## 2017-07-30 LAB — METABOLIC PANEL, COMPREHENSIVE
A-G Ratio: 0.9 (ref 0.8–1.7)
ALT (SGPT): 15 U/L (ref 13–56)
AST (SGOT): 11 U/L — ABNORMAL LOW (ref 15–37)
Albumin: 3.7 g/dL (ref 3.4–5.0)
Alk. phosphatase: 88 U/L (ref 45–117)
Anion gap: 7 mmol/L (ref 3.0–18)
BUN/Creatinine ratio: 14 (ref 12–20)
BUN: 11 MG/DL (ref 7.0–18)
Bilirubin, total: 0.2 MG/DL (ref 0.2–1.0)
CO2: 25 mmol/L (ref 21–32)
Calcium: 8.9 MG/DL (ref 8.5–10.1)
Chloride: 106 mmol/L (ref 100–108)
Creatinine: 0.8 MG/DL (ref 0.6–1.3)
GFR est AA: >60 mL/min/{1.73_m2} (ref >60)
GFR est non-AA: >60 mL/min/{1.73_m2} (ref >60)
Globulin: 3.9 g/dL (ref 2.0–4.0)
Glucose: 76 mg/dL (ref 74–99)
Potassium: 4.4 mmol/L (ref 3.5–5.5)
Protein, total: 7.6 g/dL (ref 6.4–8.2)
Sodium: 138 mmol/L (ref 136–145)

## 2017-07-30 LAB — FERRITIN: Ferritin: 87 NG/ML (ref 8–388)

## 2017-07-30 LAB — IRON PROFILE
Iron % saturation: 19 %
Iron: 56 ug/dL (ref 50–175)
TIBC: 292 ug/dL (ref 250–450)

## 2017-07-30 LAB — SED RATE (ESR): Sed rate, automated: 7 mm/hr (ref 0–20)

## 2017-08-26 ENCOUNTER — Ambulatory Visit: Admit: 2017-08-26 | Discharge: 2017-08-26 | Payer: MEDICARE | Attending: Family Medicine | Primary: Family

## 2017-08-26 DIAGNOSIS — Z0000 Encounter for general adult medical examination without abnormal findings: Secondary | ICD-10-CM

## 2017-08-26 MED ORDER — POTASSIUM CHLORIDE SR 10 MEQ TAB, PARTICLES/CRYSTALS
10 mEq | ORAL_TABLET | Freq: Every day | ORAL | 3 refills | Status: DC
Start: 2017-08-26 — End: 2018-08-06

## 2017-08-26 MED ORDER — FUROSEMIDE 20 MG TAB
20 mg | ORAL_TABLET | Freq: Every day | ORAL | 3 refills | Status: DC | PRN
Start: 2017-08-26 — End: 2018-08-06

## 2017-08-26 MED ORDER — LISINOPRIL-HYDROCHLOROTHIAZIDE 20 MG-25 MG TAB
20-25 mg | ORAL_TABLET | ORAL | 3 refills | Status: DC
Start: 2017-08-26 — End: 2018-08-06

## 2017-08-26 MED ORDER — CYCLOBENZAPRINE 5 MG TAB
5 mg | ORAL_TABLET | Freq: Three times a day (TID) | ORAL | 3 refills | Status: DC | PRN
Start: 2017-08-26 — End: 2018-08-06

## 2017-08-26 NOTE — Patient Instructions (Signed)
Medicare Wellness Visit, Female     The best way to live healthy is to have a lifestyle where you eat a well-balanced diet, exercise regularly, limit alcohol use, and quit all forms of tobacco/nicotine, if applicable.   Regular preventive services are another way to keep healthy. Preventive services (vaccines, screening tests, monitoring & exams) can help personalize your care plan, which helps you manage your own care. Screening tests can find health problems at the earliest stages, when they are easiest to treat.   [facility] follows the current, evidence-based guidelines published by the [place] [employer] (USPSTF) when recommending preventive services for our patients. Because we follow these guidelines, sometimes recommendations change over time as research supports it. (For example, mammograms used to be recommended annually. Even though Medicare will still pay for an annual mammogram, the newer guidelines recommend a mammogram every two years for women of average risk.)  Of course, you and your doctor may decide to screen more often for some diseases, based on your risk and your health status.   Preventive services for you include:  - Medicare offers their members a free annual wellness visit, which is time for you and your primary care provider to discuss and plan for your preventive service needs. Take advantage of this benefit every year!  -All adults over the age of 61 should receive the recommended pneumonia vaccines. Current USPSTF guidelines recommend a series of two vaccines for the best pneumonia protection.   -All adults should have a flu vaccine yearly and a tetanus vaccine every 10 years. All adults age 10 and older should receive a shingles vaccine once in their lifetime.    -A bone mass density test is recommended when a woman turns 65 to screen for osteoporosis. This test is only recommended one time, as a screening.  Some providers will use this same test as a disease monitoring tool if you already have osteoporosis.  -All adults age 72-70 who are overweight should have a diabetes screening test once every three years.   -Other screening tests and preventive services for persons with diabetes include: an eye exam to screen for diabetic retinopathy, a kidney function test, a foot exam, and stricter control over your cholesterol.   -Cardiovascular screening for adults with routine risk involves an electrocardiogram (ECG) at intervals determined by your doctor.   -Colorectal cancer screenings should be done for adults age 30-75 with no increased risk factors for colorectal cancer.  There are a number of acceptable methods of screening for this type of cancer. Each test has its own benefits and drawbacks. Discuss with your doctor what is most appropriate for you during your annual wellness visit. The different tests include: colonoscopy (considered the best screening method), a fecal occult blood test, a fecal DNA test, and sigmoidoscopy.  -Breast cancer screenings are recommended every other year for women of normal risk, age 108-74.  -Cervical cancer screenings for women over age 40 are only recommended with certain risk factors.   -All adults born between [place] should be screened once for Hepatitis C.     Here is a list of your current Health Maintenance items (your personalized list of preventive services) with a due date:  Health Maintenance Due   Topic Date Due   ??? Pneumococcal Vaccine (1 of 3 - PCV13) 04/27/1986   ??? DTaP/Tdap/Td  (1 - Tdap) 04/27/1988   ??? Annual Well Visit  01/31/2017   ??? Shingles Vaccine (1 of 2)  04/27/2017   ??? Stool testing for trace blood  04/27/2017   ??? Breast Cancer Screening  04/28/2017

## 2017-08-26 NOTE — Progress Notes (Signed)
This is the Subsequent Medicare Annual Wellness Exam, performed 12 months or more after the Initial AWV or the last Subsequent AWV    I have reviewed the patient's medical history in detail and updated the computerized patient record.     History     Past Medical History:   Diagnosis Date   ??? Anxiety    ??? Arthritis 10/17/2015   ??? Asthma    ??? Bladder incontinence    ??? Cancer [facility])     lymph node cancer   ??? Depression 08/17/2014   ??? Diabetes (HCC)     border line diabetes   ??? GERD (gastroesophageal reflux disease)    ??? Hypertension    ??? Lymphoma ([place])    ??? Neck mass    ??? Prediabetes    ??? VAIN I (vaginal intraepithelial neoplasia grade I)       Past Surgical History:   Procedure Laterality Date   ??? HX GYN      BTL   ??? HX HYSTERECTOMY  2004   ??? HX OTHER SURGICAL      lymph node biopsies neck   ??? HX VASCULAR ACCESS      double right chest mediport   ??? PR RMVL TUN CTR VAD W/SUBQ PORT/PMP CTR/PRPH INSJ N/A 03/14/2016    Dr. Jimmye Norman     Current Outpatient Medications   Medication Sig Dispense Refill   ??? furosemide (LASIX) 20 mg tablet Take 1 Tab by mouth daily as needed. Indications: Edema 90 Tab 3   ??? potassium chloride (KLOR-CON) 10 mEq tablet Take 1 Tab by mouth daily. Indications: hypokalemia prevention 90 Tab 3   ??? lisinopril-hydroCHLOROthiazide (PRINZIDE, ZESTORETIC) 20-25 mg per tablet take 1 tablet by mouth once daily 90 Tab 1   ??? cyclobenzaprine (FLEXERIL) 5 mg tablet Take 1 Tab by mouth three (3) times daily as needed for Muscle Spasm(s). 90 Tab 3     Allergies   Allergen Reactions   ??? Vicodin [Hydrocodone-Acetaminophen] Itching     Family History   Problem Relation Age of Onset   ??? Cancer Mother         throat   ??? Breast Cancer Mother    ??? Colon Cancer Mother    ??? Hypertension Mother    ??? Colon Cancer Father    ??? Pacemaker Father    ??? Cancer Sister         thigh that moved to lung, sounds like myosarcoma?   ??? Hypertension Sister    ??? Alcohol abuse Neg Hx    ??? Arthritis-rheumatoid Neg Hx    ??? Asthma Neg Hx     ??? Bleeding Prob Neg Hx    ??? Diabetes Neg Hx    ??? Elevated Lipids Neg Hx    ??? Headache Neg Hx    ??? Lung Disease Neg Hx    ??? Migraines Neg Hx    ??? Psychiatric Disorder Neg Hx    ??? Stroke Neg Hx    ??? Mental Retardation Neg Hx      Social History     Tobacco Use   ??? Smoking status: Current Every Day Smoker     Packs/day: 0.50     Years: 28.00     Pack years: 14.00   ??? Smokeless tobacco: Never Used   Substance Use Topics   ??? Alcohol use: Yes     Alcohol/week: 1.8 - 2.4 oz     Types: 3 - 4 Glasses of wine per week  Comment: every 3 to 4 days     Patient Active Problem List   Diagnosis Code   ??? Prediabetes R73.03   ??? Essential hypertension I10   ??? Urge incontinence N39.41   ??? 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 Back pain M54.9   ??? Advance directive discussed with patient Z71.89   ??? Chronic anemia D64.9   ??? Arthritis M19.90   ??? 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 Pedal edema R60.0   ??? Class 1 obesity due to excess calories with serious comorbidity and body mass index (BMI) of 33.0 to 33.9 in adult E66.09, Z68.33       Depression Risk Factor Screening:     PHQ over the last two weeks 04/15/2014   Little interest or pleasure in doing things Several days   Feeling down, depressed, irritable, or hopeless Several days   Total Score PHQ 2 2     Alcohol Risk Factor Screening:   You do not drink alcohol or very rarely.    Functional Ability and Level of Safety:   Hearing Loss  Hearing is good.    Activities of Daily Living  The home contains: no safety equipment.  Patient does total self care    Fall Risk  No flowsheet data found.    Abuse Screen  Patient is not abused    Cognitive Screening   Evaluation of Cognitive Function:  Has your family/caregiver stated any concerns about your memory: no  Normal    Patient Care Team   Patient Care Team:  Nolberto Hanlon, MD as PCP - General (Family Practice)  Suzy Bouchard, MD (Oncology)    Assessment/Plan   Education and counseling provided:   Are appropriate based on today's review and evaluation  Screening Mammography  Colorectal cancer screening tests    Diagnoses and all orders for this visit:    1. Medicare annual wellness visit, subsequent    2. Screening for alcoholism  -     PR ANNUAL ALCOHOL SCREEN 15 MIN    3. Encounter for screening mammogram for malignant neoplasm of breast  -     MAM MAMMO BI SCREENING INCL CAD; Future        Health Maintenance Due   Topic Date Due   ??? Pneumococcal 19-64 Highest Risk (1 of 3 - PCV13) 04/27/1986   ??? DTaP/Tdap/Td series (1 - Tdap) 04/27/1988   ??? MEDICARE YEARLY EXAM  01/31/2017   ??? Shingrix Vaccine Age 55> (1 of 2) 04/27/2017   ??? FOBT Q 1 YEAR AGE 48-75  04/27/2017   ??? BREAST CANCER SCRN MAMMOGRAM  04/28/2017

## 2017-08-26 NOTE — Progress Notes (Signed)
Melissa Leblanc is a 52 y.o.  female presents today for follow up visit for   Chief Complaint   Patient presents with   ??? Hypertension   ??? Back Pain   .       Depression Screening:  PHQ over the last two weeks 04/15/2014   Little interest or pleasure in doing things Several days   Feeling down, depressed, irritable, or hopeless Several days   Total Score PHQ 2 2       Learning Assessment:  Learning Assessment 03/06/2016   PRIMARY LEARNER Patient   HIGHEST LEVEL OF EDUCATION - PRIMARY LEARNER  -   BARRIERS PRIMARY LEARNER NONE   PRIMARY LANGUAGE ENGLISH   LEARNER PREFERENCE PRIMARY LISTENING   ANSWERED BY self   RELATIONSHIP SELF       Abuse Screening:  Abuse Screening Questionnaire 12/14/2013   Do you ever feel afraid of your partner? N   Are you in a relationship with someone who physically or mentally threatens you? N   Is it safe for you to go home? Y       Fall Risk  No flowsheet data found.        1. Have you been to the ER, urgent care clinic since your last visit?  Hospitalized since your last visit?No    2. Have you seen or consulted any other health care providers outside of the [facility] since your last visit?  Include any pap smears or colon screening. No    Health Maintenance reviewed -   Health Maintenance Due   Topic Date Due   ??? Pneumococcal 19-64 Highest Risk (1 of 3 - PCV13) 04/27/1986   ??? DTaP/Tdap/Td series (1 - Tdap) 04/27/1988   ??? MEDICARE YEARLY EXAM  01/31/2017   ??? Shingrix Vaccine Age 19> (1 of 2) 04/27/2017   ??? FOBT Q 1 YEAR AGE 71-75  04/27/2017   ??? BREAST CANCER SCRN MAMMOGRAM  04/28/2017   .     Upcoming Appts  No    VORB: No orders of the defined types were placed in this encounter.  Stanton Kidney Derry Lory, MD/Derika Caryl Pina, LPN

## 2017-08-26 NOTE — Progress Notes (Signed)
HISTORY OF PRESENT ILLNESS  Melissa Leblanc is a 52 y.o. female.   Seen in follow-up for HTN, pedal edema, DJD, urge incontinence, obesity. She wasn't able to get her most recent prescriptions filled due to insurance and pharmacy issues. She also didn't get any incontinence supplies.        Review of Systems   Constitutional: Negative for chills and fever.   Respiratory: Negative for shortness of breath.    Cardiovascular: Positive for leg swelling. Negative for chest pain and palpitations.   Gastrointestinal: Negative for nausea and vomiting.   Neurological: Negative for tingling, focal weakness and headaches.   Psychiatric/Behavioral: The patient does not have insomnia.      Visit Vitals  BP (!) 145/91 (BP 1 Location: Right arm, BP Patient Position: Sitting)   Pulse 74   Temp 97.8 ??F (36.6 ??C) (Oral)   Resp 16   Ht 5\' 5"  (1.651 m)   Wt 208 lb (94.3 kg)   LMP  (LMP Unknown)   SpO2 94%   BMI 34.61 kg/m??       Physical Exam   Constitutional: She is oriented to person, place, and time. She appears well-developed and well-nourished. No distress.   Neck: Neck supple. No thyromegaly present.   Carotid bruit absent   Cardiovascular: Normal rate, regular rhythm and intact distal pulses. Exam reveals no gallop and no friction rub.   No murmur heard.  Pulmonary/Chest: Effort normal and breath sounds normal. No respiratory distress.   Musculoskeletal: She exhibits no edema.   Lymphadenopathy:     She has no cervical adenopathy.   Neurological: She is alert and oriented to person, place, and time.   Skin: Skin is warm and dry.   Psychiatric: She has a normal mood and affect. Her behavior is normal. Judgment and thought content normal.   Nursing note and vitals reviewed.      ASSESSMENT and PLAN    ICD-10-CM ICD-9-CM    1. Essential hypertension I10 401.9 lisinopril-hydroCHLOROthiazide (PRINZIDE, ZESTORETIC) 20-25 mg per tablet      potassium chloride (KLOR-CON) 10 mEq tablet    2. Pedal edema R60.0 782.3 furosemide (LASIX) 20 mg tablet   3. Muscle spasm M62.838 728.85 cyclobenzaprine (FLEXERIL) 5 mg tablet     Follow-up Disposition:  Return in about 1 year (around 08/26/2018), or if symptoms worsen or fail to improve. Three months for routine follow-up.  current treatment plan is effective, no change in therapy - refills as noted. Will resubmit order for incontinence supplies.  reviewed diet, exercise and weight control  reviewed medications and side effects in detail  Plan of care reviewed - patient verbalize(s) understanding and agreement.

## 2017-09-04 ENCOUNTER — Ambulatory Visit

## 2017-09-04 ENCOUNTER — Inpatient Hospital Stay: Admit: 2017-09-04 | Payer: MEDICARE | Attending: Family Medicine | Primary: Family

## 2017-09-04 DIAGNOSIS — Z1231 Encounter for screening mammogram for malignant neoplasm of breast: Secondary | ICD-10-CM

## 2017-09-04 NOTE — Progress Notes (Signed)
I called pt to discuss normal mammogram results which were normal pt verified name and d.o.b and verbalized understanding.

## 2017-09-04 NOTE — Progress Notes (Signed)
Please notify patient that results are normal - mammogram.

## 2017-11-26 ENCOUNTER — Ambulatory Visit: Admit: 2017-11-26 | Discharge: 2017-11-26 | Payer: MEDICARE | Attending: Family | Primary: Family

## 2017-11-26 ENCOUNTER — Encounter: Attending: Family Medicine | Primary: Family

## 2017-11-26 DIAGNOSIS — I10 Essential (primary) hypertension: Secondary | ICD-10-CM

## 2017-11-26 LAB — AMB POC HEMOGLOBIN (HGB): Hemoglobin (POC): 5.9

## 2017-11-26 MED ORDER — OMEPRAZOLE 20 MG TAB, DELAYED RELEASE
20 mg | ORAL_TABLET | Freq: Every day | ORAL | 1 refills | Status: DC
Start: 2017-11-26 — End: 2018-08-06

## 2017-11-26 NOTE — Progress Notes (Signed)
Chief Complaint   Patient presents with   ??? Establish Care     HX of HTN Hodgkins Arthritis

## 2017-11-26 NOTE — Progress Notes (Signed)
Melissa Leblanc is a 52 y.o. female presenting today for Hypertension  .       HPI:  Melissa Leblanc presents to the office today for hypertension follow-up care.  Patient presents stating she is feeling fine.  She denies any chest pain, palpitation or lower extremity edema.  She has a previous history of Hodgkin's disease, arthritis and GERD.  She reports she is not taking any medication for GERD even though she is having symptoms daily.  She is negative for any cough, wheezing or dyspnea.  Patient also has a history of prediabetes her last hemoglobin A1c in May 2018 was 6.0.  She denies any polyuria, polydipsia polyphagia.    Review of Systems   Respiratory: Negative for cough and sputum production.    Cardiovascular: Negative for chest pain and palpitations.   Gastrointestinal: Positive for heartburn. Negative for abdominal pain, diarrhea, nausea and vomiting.   Genitourinary: Negative for dysuria, frequency and urgency.   Musculoskeletal: Positive for joint pain and myalgias.        History of arthritis   Neurological: Negative for dizziness and headaches.       Allergies   Allergen Reactions   ??? Vicodin [Hydrocodone-Acetaminophen] Itching       Current Outpatient Medications   Medication Sig Dispense Refill   ??? Omeprazole delayed release (PRILOSEC D/R) 20 mg tablet Take 1 Tab by mouth daily. 30 Tab 1   ??? lisinopril-hydroCHLOROthiazide (PRINZIDE, ZESTORETIC) 20-25 mg per tablet take 1 tablet by mouth once daily 90 Tab 3   ??? potassium chloride (KLOR-CON) 10 mEq tablet Take 1 Tab by mouth daily. 90 Tab 3   ??? furosemide (LASIX) 20 mg tablet Take 1 Tab by mouth daily as needed. 90 Tab 3   ??? cyclobenzaprine (FLEXERIL) 5 mg tablet Take 1 Tab by mouth three (3) times daily as needed for Muscle Spasm(s). 34 Tab 3       Past Medical History:   Diagnosis Date   ??? Anxiety    ??? Arthritis 10/17/2015   ??? Asthma    ??? Bladder incontinence    ??? Cancer [facility])     lymph node cancer   ??? Depression 08/17/2014   ??? Diabetes (HCC)      border line diabetes   ??? GERD (gastroesophageal reflux disease)    ??? Hypertension    ??? Lymphoma ([place])    ??? Neck mass    ??? Prediabetes    ??? VAIN I (vaginal intraepithelial neoplasia grade I)        Past Surgical History:   Procedure Laterality Date   ??? HX GYN      BTL   ??? HX HYSTERECTOMY  2004   ??? HX OTHER SURGICAL      lymph node biopsies neck   ??? HX VASCULAR ACCESS      double right chest mediport   ??? PR RMVL TUN CTR VAD W/SUBQ PORT/PMP CTR/PRPH INSJ N/A 03/14/2016    Dr. Jimmye Norman       Social History     Socioeconomic History   ??? Marital status: SINGLE     Spouse name: Not on file   ??? Number of children: Not on file   ??? Years of education: Not on file   ??? Highest education level: Not on file   Occupational History   ??? Not on file   Social Needs   ??? Financial resource strain: Not on file   ??? Food insecurity:     Worry: Not  on file     Inability: Not on file   ??? Transportation needs:     Medical: Not on file     Non-medical: Not on file   Tobacco Use   ??? Smoking status: Current Every Day Smoker     Packs/day: 0.50     Years: 28.00     Pack years: 14.00   ??? Smokeless tobacco: Never Used   Substance and Sexual Activity   ??? Alcohol use: Yes     Alcohol/week: 1.8 - 2.4 oz     Types: 3 - 4 Glasses of wine per week     Comment: every 3 to 4 days   ??? Drug use: Yes     Types: Marijuana     Comment: one or 2 puffs occasionally (none now as of 03/11/16 per patient)   ??? Sexual activity: Never   Lifestyle   ??? Physical activity:     Days per week: Not on file     Minutes per session: Not on file   ??? Stress: Not on file   Relationships   ??? Social connections:     Talks on phone: Not on file     Gets together: Not on file     Attends religious service: Not on file     Active member of club or organization: Not on file     Attends meetings of clubs or organizations: Not on file     Relationship status: Not on file   ??? Intimate partner violence:     Fear of current or ex partner: Not on file     Emotionally abused: Not on file      Physically abused: Not on file     Forced sexual activity: Not on file   Other Topics Concern   ??? Not on file   Social History Narrative   ??? Not on file       Patient does not have an advanced directive on file    Vitals:    11/26/17 0941   BP: 135/83   Pulse: 74   Resp: 18   Temp: 98.6 ??F (37 ??C)   TempSrc: Tympanic   SpO2: 99%   Weight: 210 lb (95.3 kg)   Height: 5\' 5"  (1.651 m)   PainSc:   0 - No pain       Physical Exam   Constitutional: No distress.   Cardiovascular: Normal rate, regular rhythm and normal heart sounds.   Pulmonary/Chest: Effort normal and breath sounds normal.   Abdominal: Soft.   Musculoskeletal: She exhibits no edema.   Neurological: She is alert.   Nursing note and vitals reviewed.      Office Visit on 11/26/2017   Component Date Value Ref Range Status   ??? Hemoglobin (POC) 11/26/2017 5.9   Final       .  Results for orders placed or performed in visit on 11/26/17   AMB POC HEMOGLOBIN (HGB)   Result Value Ref Range    Hemoglobin (POC) 5.9        Assessment / Plan:      ICD-10-CM ICD-9-CM    1. Essential hypertension I10 401.9 LIPID PANEL   2. Screening for malignant neoplasm of colon Z12.11 V76.51 REFERRAL TO GASTROENTEROLOGY   3. Class 1 obesity due to excess calories with serious comorbidity and body mass index (BMI) of 33.0 to 33.9 in adult E66.09 278.00     Z68.33 V85.33    4. Prediabetes R73.03 790.29  AMB POC HEMOGLOBIN (HGB)   5. Encounter for vitamin deficiency screening Z13.21 V77.99 VITAMIN D, 25 HYDROXY   6. Gastroesophageal reflux disease, esophagitis presence not specified K21.9 530.81 Omeprazole delayed release (PRILOSEC D/R) 20 mg tablet       Follow-up and Dispositions    ?? Return in about 1 month (around 12/26/2017).     Hypertension-blood pressure is controlled today.  No change to current treatment plan  Referral to gastroenterologist for screening for malignant neoplasm of the colon  Prediabetes-hemoglobin A1c 5.6 today   Fasting labs ordered vitamin D, CBC, CMP, lipid and thyroid  GERD-patient given prescription for omeprazole 20 mg daily.  Patient instructed that if her symptoms are not improved with 20 mg daily we can increase the dose to twice daily.  Tobacco abuse-patient continues to smoke.  Discussed the risk of smoking with patient.  Follow-up for new patient visit in 1 month      I asked the patient if she  had any questions and answered her  questions.  The patient stated that she understands the treatment plan and agrees with the treatment plan

## 2017-11-28 ENCOUNTER — Inpatient Hospital Stay: Admit: 2017-11-28 | Primary: Family

## 2017-11-28 ENCOUNTER — Ambulatory Visit: Admit: 2017-11-28 | Discharge: 2017-11-28 | Payer: MEDICARE | Attending: Hematology & Oncology | Primary: Family

## 2017-11-28 DIAGNOSIS — D72819 Decreased white blood cell count, unspecified: Secondary | ICD-10-CM

## 2017-11-28 LAB — CBC WITH 3 PART DIFF
ABS. LYMPHOCYTES: 2.5 10*3/uL (ref 1.1–5.9)
ABS. MIXED CELLS: 0.3 10*3/uL (ref 0.0–2.3)
ABS. NEUTROPHILS: 3 10*3/uL (ref 1.8–9.5)
HCT: 37.4 % (ref 36–48)
HGB: 12.1 g/dL (ref 12.0–16.0)
LYMPHOCYTES: 43 % (ref 14–44)
MCH: 26.5 PG (ref 25.0–35.0)
MCHC: 32.4 g/dL (ref 31–37)
MCV: 82 FL (ref 78–102)
Mixed cells: 5 % (ref 0.1–17)
NEUTROPHILS: 52 % (ref 40–70)
PLATELET: 239 10*3/uL (ref 140–440)
RBC: 4.56 M/uL (ref 4.10–5.10)
RDW: 17.7 % — ABNORMAL HIGH (ref 11.5–14.5)
WBC: 5.8 10*3/uL (ref 4.5–13.0)

## 2017-11-28 NOTE — Patient Instructions (Addendum)
Hodgkin's: Care Instructions  Your Care Instructions    Hodgkin lymphoma is a type of cancer that affects part of the immune system (lymph system). Cells normally found in the lymph nodes and spleen can increase in number and collect in areas where they cause problems. Hodgkin lymphoma is not contagious and is not caused by an injury.  Treatment for Hodgkin lymphoma depends on the stage of the lymphoma and what type of lymphoma you have. It is usually treated with medicines called chemotherapy. You may also need radiation treatments or a procedure called a bone marrow transplant. Or you may have targeted therapy. Your doctor will talk to you about what kind of treatment may be best for you.  When you find out that you have cancer, you may feel many emotions and may need some help coping. Seek out family, friends, and counselors for support. You also can do things at home to make yourself feel better while you go through treatment. Call the American Cancer Society (1-800-227-2345) or visit its website at www.cancer.org for more information.  Follow-up care is a key part of your treatment and safety. Be sure to make and go to all appointments, and call your doctor if you are having problems. It's also a good idea to know your test results and keep a list of the medicines you take.  How can you care for yourself at home?  ?? Take your medicines exactly as prescribed. Call your doctor if you think you are having a problem with your medicine. You may get medicine for nausea and vomiting if you have these side effects.  ?? Eat healthy food. If you do not feel like eating, try to eat food that has protein and extra calories to keep up your strength and prevent weight loss. Drink liquid meal replacements for extra calories and protein. Try to eat your main meal early.  ?? Get some physical activity every day, but do not get too tired. Keep doing the hobbies you enjoy as your energy allows.   ?? Take steps to control your stress and workload. Learn relaxation techniques.  ? Share your feelings. Stress and tension affect our emotions. By expressing your feelings to others, you may be able to understand and cope with them.  ? Consider joining a support group. Talking about a problem with your spouse, a good friend, or other people with similar problems is a good way to reduce tension and stress.  ? Express yourself through art. Try writing, crafts, dance, or art to relieve stress. Some dance, writing, or art groups may be available just for people who have cancer.  ? Be kind to your body and mind. Getting enough sleep, eating a healthy diet, and taking time to do things you enjoy can contribute to an overall feeling of balance in your life and can help reduce stress.  ? Get help if you need it. Discuss your concerns with your doctor or counselor.  ?? If you are vomiting or have diarrhea:  ? Drink plenty of fluids (enough so that your urine is light yellow or clear like water) to prevent dehydration. Choose water and other caffeine-free clear liquids. If you have kidney, heart, or liver disease and have to limit fluids, talk with your doctor before you increase the amount of fluids you drink.  ? When you are able to eat, try clear soups, mild foods, and liquids until all symptoms are gone for 12 to 48 hours. Other good choices include dry toast,   crackers, cooked cereal, and gelatin dessert, such as Jell-O.  ?? If you have not already done so, prepare a list of advance directives. Advance directives are instructions to your doctor and family members about what kind of care you want if you become unable to speak or express yourself.  When should you call for help?  Call 911 anytime you think you may need emergency care. For example, call if:  ?? ?? You passed out (lost consciousness).   ??Call your doctor now or seek immediate medical care if:  ?? ?? You have a fever.   ?? ?? You have abnormal bleeding.    ?? ?? You have new or worse pain.   ?? ?? You think you have an infection.   ?? ?? You have new symptoms, such as a cough, belly pain, vomiting, diarrhea, or a rash.   ??Watch closely for changes in your health, and be sure to contact your doctor if:  ?? ?? You are much more tired than usual.   ?? ?? You have swollen glands in your armpits, groin, or neck.   ?? ?? You do not get better as expected.   Where can you learn more?  Go to [email or web address].  Enter [phone] in the search box to learn more about "Hodgkin's: Care Instructions."  Current as of: November 12, 2016  Content Version: 11.9  ?? 2006-2018 [employer]. Care instructions adapted under license by [employer] (which disclaims liability or warranty for this information). If you have questions about a medical condition or this instruction, always ask your healthcare professional. [place] any warranty or liability for your use of this information.         Complete Blood Count (CBC): About This Test  What is it?    A complete blood count (CBC) is a blood test that gives important information about your blood cells, especially red blood cells, white blood cells, and platelets.  Why is this test done?  A CBC may be done as part of a regular physical exam. There are many other reasons that a doctor may want this blood test, including to:  ?? Find the cause of symptoms such as fatigue, weakness, fever, bruising, or weight loss.  ?? Find anemia or an infection.  ?? See how much blood has been lost if there is bleeding.  ?? Diagnose diseases of the blood, such as leukemia or polycythemia.  How can you prepare for the test?  You do not need to do anything before having this test.  What happens during the test?  The health professional taking a sample of your blood will:  ?? Wrap an elastic band around your upper arm. This makes the veins below the band larger so it is easier to put a needle into the vein.   ?? Clean the needle site with alcohol.  ?? Put the needle into the vein.  ?? Attach a tube to the needle to fill it with blood.  ?? Remove the band from your arm when enough blood is collected.  ?? Put a gauze pad or cotton ball over the needle site as the needle is removed.  ?? Put pressure on the site and then put on a bandage.  If this blood test is done on a baby, a heel stick may be done instead of a blood draw from a vein.  What happens after the test?  ?? You will probably be able to go home right away.  ??  You can go back to your usual activities right away.  Follow-up care is a key part of your treatment and safety. Be sure to make and go to all appointments, and call your doctor if you are having problems. It's also a good idea to keep a list of the medicines you take. Ask your doctor when you can expect to have your test results.  Where can you learn more?  Go to [employer].  Enter [phone] in the search box to learn more about "Complete Blood Count (CBC): About This Test."  Current as of: February 10, 2017  Content Version: 11.9  ?? 2006-2018 [employer]. Care instructions adapted under license by [employer] (which disclaims liability or warranty for this information). If you have questions about a medical condition or this instruction, always ask your healthcare professional. [place] any warranty or liability for your use of this information.

## 2017-11-28 NOTE — Progress Notes (Signed)
Hematology/Oncology  Progress Note    Name: Melissa Leblanc  Date: 11/28/2017  DOB: 02/05/1966    [id number] M. Starleen Blue, DO     Melissa Leblanc is a 52 year old female who was seen for management of her Hodgkin's lymphoma.    Current therapy: active surveillance, the patient previously completed 8 cycles of systemic chemotherapy with the ABVD regimen (2014)    Subjective:     Melissa Leblanc is a 52 year old African American woman has a history of Hodgkin's lymphoma. She has previously completed a full course of systemic chemotherapy with a combination of ABVD,  In 2014. She denies fevers, night sweats and lymphadenopathy. She states her appetite is excellent. She does not have any concerns or complaints to report at this time.    Past medical history, family history, and social history: these were reviewed and remains unchanged.    Past Medical History:   Diagnosis Date   ??? Anxiety    ??? Arthritis 10/17/2015   ??? Asthma    ??? Bladder incontinence    ??? Cancer [facility])     lymph node cancer   ??? Depression 08/17/2014   ??? Diabetes (HCC)     border line diabetes   ??? GERD (gastroesophageal reflux disease)    ??? Hypertension    ??? Lymphoma ([place])    ??? Neck mass    ??? Prediabetes    ??? VAIN I (vaginal intraepithelial neoplasia grade I)      Past Surgical History:   Procedure Laterality Date   ??? HX GYN      BTL   ??? HX HYSTERECTOMY  2004   ??? HX OTHER SURGICAL      lymph node biopsies neck   ??? HX VASCULAR ACCESS      double right chest mediport   ??? PR RMVL TUN CTR VAD W/SUBQ PORT/PMP CTR/PRPH INSJ N/A 03/14/2016    Dr. Jimmye Norman     Social History     Socioeconomic History   ??? Marital status: SINGLE     Spouse name: Not on file   ??? Number of children: Not on file   ??? Years of education: Not on file   ??? Highest education level: Not on file   Occupational History   ??? Not on file   Social Needs   ??? Financial resource strain: Not on file   ??? Food insecurity:     Worry: Not on file     Inability: Not on file   ??? Transportation needs:     Medical: Not on file      Non-medical: Not on file   Tobacco Use   ??? Smoking status: Current Every Day Smoker     Packs/day: 0.50     Years: 28.00     Pack years: 14.00   ??? Smokeless tobacco: Never Used   Substance and Sexual Activity   ??? Alcohol use: Yes     Alcohol/week: 1.8 - 2.4 oz     Types: 3 - 4 Glasses of wine per week     Comment: every 3 to 4 days   ??? Drug use: Yes     Types: Marijuana     Comment: one or 2 puffs occasionally (none now as of 03/11/16 per patient)   ??? Sexual activity: Never   Lifestyle   ??? Physical activity:     Days per week: Not on file     Minutes per session: Not on file   ??? Stress: Not on  file   Relationships   ??? Social connections:     Talks on phone: Not on file     Gets together: Not on file     Attends religious service: Not on file     Active member of club or organization: Not on file     Attends meetings of clubs or organizations: Not on file     Relationship status: Not on file   ??? Intimate partner violence:     Fear of current or ex partner: Not on file     Emotionally abused: Not on file     Physically abused: Not on file     Forced sexual activity: Not on file   Other Topics Concern   ??? Not on file   Social History Narrative   ??? Not on file     Family History   Problem Relation Age of Onset   ??? Cancer Mother         throat   ??? Breast Cancer Mother    ??? Colon Cancer Mother    ??? Hypertension Mother    ??? Colon Cancer Father    ??? Pacemaker Father    ??? Cancer Sister         thigh that moved to lung, sounds like myosarcoma?   ??? Hypertension Sister    ??? Alcohol abuse Neg Hx    ??? Arthritis-rheumatoid Neg Hx    ??? Asthma Neg Hx    ??? Bleeding Prob Neg Hx    ??? Diabetes Neg Hx    ??? Elevated Lipids Neg Hx    ??? Headache Neg Hx    ??? Lung Disease Neg Hx    ??? Migraines Neg Hx    ??? Psychiatric Disorder Neg Hx    ??? Stroke Neg Hx    ??? Mental Retardation Neg Hx      Current Outpatient Medications   Medication Sig Dispense Refill   ??? Omeprazole delayed release (PRILOSEC D/R) 20 mg tablet Take 1 Tab by  mouth daily. 30 Tab 1   ??? lisinopril-hydroCHLOROthiazide (PRINZIDE, ZESTORETIC) 20-25 mg per tablet take 1 tablet by mouth once daily 90 Tab 3   ??? potassium chloride (KLOR-CON) 10 mEq tablet Take 1 Tab by mouth daily. 90 Tab 3   ??? furosemide (LASIX) 20 mg tablet Take 1 Tab by mouth daily as needed. 90 Tab 3   ??? cyclobenzaprine (FLEXERIL) 5 mg tablet Take 1 Tab by mouth three (3) times daily as needed for Muscle Spasm(s). 90 Tab 3       Review of Systems  Constitutional: The patient has no acute distress or discomfort.  HEENT: The patient denies recent head trauma, eye pain, blurred vision,  hearing deficit, oropharyngeal mucosal pain or lesions, and the patient denies throat pain or discomfort.  Lymphatics: The patient denies palpable peripheral lymphadenopathy.  Hematologic: The patient denies having bruising, bleeding, or progressive fatigue.  Respiratory: the patient is complaining of some cough, low-grade fever and running nose.  Cardiovascular: The patient denies having leg pain, leg swelling, heart palpitations, chest permit, chest pain, or lightheadedness.  The patient denies having dyspnea on exertion.  Gastrointestinal: The patient denies having nausea, emesis, or diarrhea. The patient denies having any hematemesis or blood in the stool.  Genitourinary: Patient denies having urinary urgency, frequency, or dysuria.  The patient denies having blood in the urine.  Psychological: The patient denies having symptoms of nervousness, anxiety, depression, or thoughts of harming herself  Or anybody .  Skin: Patient denies having skin rashes, skin, ulcerations, or unexplained itching or pruritus.  Musculoskeletal: The patient denies having pain in the joints or bones.      Objective:     Visit Vitals  BP 140/83   Pulse 75   Temp 98.7 ??F (37.1 ??C) (Oral)   Resp 16   Ht 5' 5"  (1.651 m)   Wt 96.7 kg (213 lb 3.2 oz)   LMP  (LMP Unknown)   SpO2 100%   BMI 35.48 kg/m??     ECOG PS=0, pain score=0/10    Physical Exam:    Gen. Appearance: The patient is in no acute distress.  Skin: There is no bruise or rash.  HEENT: The exam is unremarkable, except for mild facial puffiness.  Neck: Supple without discrete lymphadenopathy but some fullness in his supraclavicular area is present.  Lungs: Clear to auscultation and percussion; there are no wheezes or rhonchi.  Heart: Regular rate and rhythm; there are no murmurs, gallops, or rubs.  Abdomen: Bowel sounds are present and normal.  There is no guarding, tenderness, or hepatosplenomegaly.  Extremities: There is no clubbing, cyanosis, or edema.  Neurologic: There are no focal neurologic deficits.  Lymphatics: There is no palpable peripheral lymphadenopathy. Musculoskeletal: The patient has full range of motion at all joints.  There is no evidence of joint deformity or effusions.  There is no focal joint tenderness.  Psychological/psychiatric: There is no clinical evidence of anxiety, depression, or melancholy.    Lab data:      Results for orders placed or performed during the hospital encounter of 11/28/17   CBC WITH 3 PART DIFF     Status: Abnormal   Result Value Ref Range Status    WBC 5.8 4.5 - 13.0 K/uL Final    RBC 4.56 4.10 - 5.10 M/uL Final    HGB 12.1 12.0 - 16.0 g/dL Final    HCT 37.4 36 - 48 % Final    MCV 82.0 78 - 102 FL Final    MCH 26.5 25.0 - 35.0 PG Final    MCHC 32.4 31 - 37 g/dL Final    RDW 17.7 (H) 11.5 - 14.5 % Final    PLATELET 239 140 - 440 K/uL Final    NEUTROPHILS 52 40 - 70 % Final    MIXED CELLS 5 0.1 - 17 % Final    LYMPHOCYTES 43 14 - 44 % Final    ABS. NEUTROPHILS 3.0 1.8 - 9.5 K/UL Final    ABS. MIXED CELLS 0.3 0.0 - 2.3 K/uL Final    ABS. LYMPHOCYTES 2.5 1.1 - 5.9 K/UL Final     Comment: Test performed at [place] or Outpatient [facility]fusion [facility] Location. Reviewed by Medical Director.    DF AUTOMATED   Final           Assessment:     1. Chronic leukopenia     2. Hodgkin's disease of lymph nodes of head, face, and neck ([place])    3. Chronic anemia      Plan:     Leukopenia: Today her CBC shows her WBC is normal at 5.8 with an absolute neutrophil count of 3.0 and absolute lymphocyte count of 2.5.  I have explained to the patient that her leukopenia has been resolved.    Hodgkin's lymphoma: On 07/29/17 her Sed rate was normal at 7. On 01/22/2017 CT chest abdomen and pelvis showed there  is no evidence of any new  lymphadenopathy or infiltrating lymphadenopathy in  chest, abdomen or in pelvis. In bilateral axillae, mediastinum and right  pulmonary hilum there are a few lymph nodes, which are not obviously changed as compared to previous study. In the periaortic area there is no obvious lymphadenopathy. The right-sided of pelvic wall there is a focal lymph node in the external iliac area, unchanged. Sedimentation rate will be obtained at this time.     Iron deficiency anemia: She takes over-the-counter iron supplement once daily and this will be continued. Her current hemoglobin is 12.1g/dL with hematocrit of 37.4%. Iron Profile and Ferritin will be obtained at this time.    Chronic arthritis : I have recommended that the patient continue to use Aleve 1 tablet twice daily. She has previously been provided Percocet in the past for severe pain. Today she did not request for any Rx.    The patient will follow-up in the clinic in 4 months or sooner if indicated.      Orders Placed This Encounter   ??? COMPLETE CBC & AUTO DIFF WBC   ??? InHouse CBC ([facility])     Standing Status:   Future     Number of Occurrences:   1     Standing Expiration Date:   1/61/0960   ??? METABOLIC PANEL, COMPREHENSIVE     Standing Status:   Future     Standing Expiration Date:   11/29/2018   ??? FERRITIN     Standing Status:   Future     Standing Expiration Date:   11/29/2018   ??? IRON PROFILE     Standing Status:   Future     Standing Expiration Date:   11/29/2018   ??? SED RATE (ESR)     Standing Status:   Future      Standing Expiration Date:   11/29/2018       Hilliard Clark, NP  11/28/2017       I have assessed the patient independently and  agree with the full assessment as outlined.  Joycelyn Das, MD, [facility]al[facility]ssman

## 2017-11-29 LAB — METABOLIC PANEL, COMPREHENSIVE
A-G Ratio: 1.4 ratio (ref 1.1–2.6)
ALT (SGPT): 13 U/L (ref 5–40)
AST (SGOT): 10 U/L (ref 10–37)
Albumin: 4.1 g/dL (ref 3.5–5.0)
Alk. phosphatase: 69 U/L (ref 25–115)
Anion gap: 17 mmol/L
BUN: 9 mg/dL (ref 6–22)
Bilirubin, total: 0.2 mg/dL (ref 0.2–1.2)
CO2: 22 mmol/L (ref 20–32)
Calcium: 9.3 mg/dL (ref 8.4–10.5)
Chloride: 102 mmol/L (ref 98–110)
Creatinine: 0.6 mg/dL (ref 0.5–1.2)
GFRAA: >60 (ref >60.0)
GFRNA: >60 (ref >60.0)
Globulin: 2.9 g/dL (ref 2.0–4.0)
Glucose: 122 mg/dL — ABNORMAL HIGH (ref 70–99)
Potassium: 4.2 mmol/L (ref 3.5–5.5)
Protein, total: 7 g/dL (ref 6.4–8.3)
Sodium: 141 mmol/L (ref 133–145)

## 2017-11-29 LAB — IRON PROFILE
Iron % saturation: 18 % — ABNORMAL LOW (ref 20–50)
Iron: 46 ug/dL (ref 30–160)
TIBC: 258 ug/dL (ref 228–428)
UIBC: 212 ug/dL (ref 110–370)

## 2017-11-29 LAB — FERRITIN: Ferritin: 104 ng/mL (ref 10–291)

## 2017-11-29 LAB — SED RATE (ESR): Sed rate (ESR): 75 mm/hr — ABNORMAL HIGH (ref 0–30)

## 2017-12-26 ENCOUNTER — Encounter: Attending: Family | Primary: Family

## 2018-02-06 ENCOUNTER — Ambulatory Visit: Attending: Family | Primary: Family

## 2018-02-06 ENCOUNTER — Inpatient Hospital Stay: Admit: 2018-02-06 | Payer: MEDICARE | Primary: Family

## 2018-02-06 ENCOUNTER — Ambulatory Visit: Admit: 2018-02-06 | Discharge: 2018-02-06 | Payer: MEDICARE | Attending: Family | Primary: Family

## 2018-02-06 DIAGNOSIS — R7303 Prediabetes: Secondary | ICD-10-CM

## 2018-02-06 DIAGNOSIS — I10 Essential (primary) hypertension: Secondary | ICD-10-CM

## 2018-02-06 LAB — CBC WITH AUTO DIFFERENTIAL
Basophils %: 0 % (ref 0–2)
Basophils Absolute: 0 10*3/uL (ref 0.0–0.1)
Eosinophils %: 2 % (ref 0–5)
Eosinophils Absolute: 0.1 10*3/uL (ref 0.0–0.4)
Hematocrit: 41.7 % (ref 35.0–45.0)
Hemoglobin: 13.5 g/dL (ref 12.0–16.0)
Lymphocytes %: 42 % (ref 21–52)
Lymphocytes Absolute: 2 10*3/uL (ref 0.9–3.6)
MCH: 25.9 PG (ref 24.0–34.0)
MCHC: 32.4 g/dL (ref 31.0–37.0)
MCV: 79.9 FL (ref 74.0–97.0)
MPV: 10.6 FL (ref 9.2–11.8)
Monocytes %: 10 % (ref 3–10)
Monocytes Absolute: 0.5 10*3/uL (ref 0.05–1.2)
Neutrophils %: 46 % (ref 40–73)
Neutrophils Absolute: 2.2 10*3/uL (ref 1.8–8.0)
Platelets: 241 10*3/uL (ref 135–420)
RBC: 5.22 M/uL (ref 4.20–5.30)
RDW: 16.5 % — ABNORMAL HIGH (ref 11.6–14.5)
WBC: 4.7 10*3/uL (ref 4.6–13.2)

## 2018-02-06 LAB — CBC WITH AUTOMATED DIFF
ABS. BASOPHILS: 0 10*3/uL (ref 0.0–0.1)
ABS. EOSINOPHILS: 0.1 10*3/uL (ref 0.0–0.4)
ABS. LYMPHOCYTES: 2 10*3/uL (ref 0.9–3.6)
ABS. MONOCYTES: 0.5 10*3/uL (ref 0.05–1.2)
ABS. NEUTROPHILS: 2.2 10*3/uL (ref 1.8–8.0)
BASOPHILS: 0 % (ref 0–2)
EOSINOPHILS: 2 % (ref 0–5)
HCT: 41.7 % (ref 35.0–45.0)
HGB: 13.5 g/dL (ref 12.0–16.0)
LYMPHOCYTES: 42 % (ref 21–52)
MCH: 25.9 PG (ref 24.0–34.0)
MCHC: 32.4 g/dL (ref 31.0–37.0)
MCV: 79.9 FL (ref 74.0–97.0)
MONOCYTES: 10 % (ref 3–10)
MPV: 10.6 FL (ref 9.2–11.8)
NEUTROPHILS: 46 % (ref 40–73)
PLATELET: 241 10*3/uL (ref 135–420)
RBC: 5.22 M/uL (ref 4.20–5.30)
RDW: 16.5 % — ABNORMAL HIGH (ref 11.6–14.5)
WBC: 4.7 10*3/uL (ref 4.6–13.2)

## 2018-02-06 NOTE — Progress Notes (Signed)
Melissa Leblanc is a 52 y.o. female presenting today for Hypertension (F/U)  .       HPI:  Melissa Leblanc presents to the office today for hypertension follow-up car.  Patient presents today without any complaints of pain. She denies any chest pain, palpitation or lower extremity edema.  She has a previous history of Hodgkin's disease, arthritis and GERD.  She is current taking Omeprazole for GERD and her symptoms have improved  She is negative for any cough, wheezing or dyspnea.  Patient also has a history of prediabetes her last hemoglobin A1c in April, 2019 was 5.9.  She denies any polyuria, polydipsia polyphagia.    Review of Systems   Respiratory: Negative for cough.    Cardiovascular: Negative for chest pain and palpitations.   Gastrointestinal: Negative for abdominal pain, nausea and vomiting.   Neurological: Negative for dizziness and headaches.       Allergies   Allergen Reactions   ??? Vicodin [Hydrocodone-Acetaminophen] Itching       Current Outpatient Medications   Medication Sig Dispense Refill   ??? Omeprazole delayed release (PRILOSEC D/R) 20 mg tablet Take 1 Tab by mouth daily. 30 Tab 1   ??? lisinopril-hydroCHLOROthiazide (PRINZIDE, ZESTORETIC) 20-25 mg per tablet take 1 tablet by mouth once daily 90 Tab 3   ??? potassium chloride (KLOR-CON) 10 mEq tablet Take 1 Tab by mouth daily. 90 Tab 3   ??? furosemide (LASIX) 20 mg tablet Take 1 Tab by mouth daily as needed. 90 Tab 3   ??? cyclobenzaprine (FLEXERIL) 5 mg tablet Take 1 Tab by mouth three (3) times daily as needed for Muscle Spasm(s). 68 Tab 3       Past Medical History:   Diagnosis Date   ??? Anxiety    ??? Arthritis 10/17/2015   ??? Asthma    ??? Bladder incontinence    ??? Cancer [facility])     lymph node cancer   ??? Depression 08/17/2014   ??? Diabetes (HCC)     border line diabetes   ??? GERD (gastroesophageal reflux disease)    ??? Hypertension    ??? Lymphoma ([place])    ??? Neck mass    ??? Prediabetes    ??? VAIN I (vaginal intraepithelial neoplasia grade I)         Past Surgical History:   Procedure Laterality Date   ??? HX GYN      BTL   ??? HX HYSTERECTOMY  2004   ??? HX OTHER SURGICAL      lymph node biopsies neck   ??? HX VASCULAR ACCESS      double right chest mediport   ??? PR RMVL TUN CTR VAD W/SUBQ PORT/PMP CTR/PRPH INSJ N/A 03/14/2016    Dr. Jimmye Norman       Social History     Socioeconomic History   ??? Marital status: SINGLE     Spouse name: Not on file   ??? Number of children: Not on file   ??? Years of education: Not on file   ??? Highest education level: Not on file   Occupational History   ??? Not on file   Social Needs   ??? Financial resource strain: Not on file   ??? Food insecurity:     Worry: Not on file     Inability: Not on file   ??? Transportation needs:     Medical: Not on file     Non-medical: Not on file   Tobacco Use   ??? Smoking  status: Current Every Day Smoker     Packs/day: 0.50     Years: 28.00     Pack years: 14.00   ??? Smokeless tobacco: Never Used   Substance and Sexual Activity   ??? Alcohol use: Yes     Alcohol/week: 1.8 - 2.4 oz     Types: 3 - 4 Glasses of wine per week     Comment: every 3 to 4 days   ??? Drug use: Yes     Types: Marijuana     Comment: one or 2 puffs occasionally (none now as of 03/11/16 per patient)   ??? Sexual activity: Never   Lifestyle   ??? Physical activity:     Days per week: Not on file     Minutes per session: Not on file   ??? Stress: Not on file   Relationships   ??? Social connections:     Talks on phone: Not on file     Gets together: Not on file     Attends religious service: Not on file     Active member of club or organization: Not on file     Attends meetings of clubs or organizations: Not on file     Relationship status: Not on file   ??? Intimate partner violence:     Fear of current or ex partner: Not on file     Emotionally abused: Not on file     Physically abused: Not on file     Forced sexual activity: Not on file   Other Topics Concern   ??? Not on file   Social History Narrative   ??? Not on file        Patient does not have an advanced directive on file    Vitals:    02/06/18 0937   BP: 133/78   Pulse: 81   Resp: 18   Temp: 98 ??F (36.7 ??C)   TempSrc: Oral   SpO2: 100%   Weight: 211 lb 12.8 oz (96.1 kg)   Height: 5' 5"  (1.651 m)   PainSc:   0 - No pain       Physical Exam   Constitutional: No distress.   Cardiovascular: Normal rate, regular rhythm and normal heart sounds.   Pulmonary/Chest: Effort normal and breath sounds normal.   Neurological: She is alert.   Nursing note and vitals reviewed.      Hospital Outpatient Visit on 02/06/2018   Component Date Value Ref Range Status   ??? WBC 02/06/2018 4.7  4.6 - 13.2 K/uL Final   ??? RBC 02/06/2018 5.22  4.20 - 5.30 M/uL Final   ??? HGB 02/06/2018 13.5  12.0 - 16.0 g/dL Final   ??? HCT 02/06/2018 41.7  35.0 - 45.0 % Final   ??? MCV 02/06/2018 79.9  74.0 - 97.0 FL Final   ??? MCH 02/06/2018 25.9  24.0 - 34.0 PG Final   ??? MCHC 02/06/2018 32.4  31.0 - 37.0 g/dL Final   ??? RDW 02/06/2018 16.5* 11.6 - 14.5 % Final   ??? PLATELET 02/06/2018 241  135 - 420 K/uL Final   ??? MPV 02/06/2018 10.6  9.2 - 11.8 FL Final   ??? NEUTROPHILS 02/06/2018 46  40 - 73 % Final   ??? LYMPHOCYTES 02/06/2018 42  21 - 52 % Final   ??? MONOCYTES 02/06/2018 10  3 - 10 % Final   ??? EOSINOPHILS 02/06/2018 2  0 - 5 % Final   ??? BASOPHILS 02/06/2018 0  0 - 2 % Final   ??? ABS. NEUTROPHILS 02/06/2018 2.2  1.8 - 8.0 K/UL Final   ??? ABS. LYMPHOCYTES 02/06/2018 2.0  0.9 - 3.6 K/UL Final   ??? ABS. MONOCYTES 02/06/2018 0.5  0.05 - 1.2 K/UL Final   ??? ABS. EOSINOPHILS 02/06/2018 0.1  0.0 - 0.4 K/UL Final   ??? ABS. BASOPHILS 02/06/2018 0.0  0.0 - 0.1 K/UL Final   ??? DF 02/06/2018 AUTOMATED    Final   ??? Sodium 02/06/2018 141  136 - 145 mmol/L Final   ??? Potassium 02/06/2018 4.1  3.5 - 5.5 mmol/L Final   ??? Chloride 02/06/2018 110* 100 - 108 mmol/L Final   ??? CO2 02/06/2018 24  21 - 32 mmol/L Final   ??? Anion gap 02/06/2018 7  3.0 - 18 mmol/L Final   ??? Glucose 02/06/2018 89  74 - 99 mg/dL Final   ??? BUN 02/06/2018 12  7.0 - 18 MG/DL Final    ??? Creatinine 02/06/2018 0.78  0.6 - 1.3 MG/DL Final   ??? BUN/Creatinine ratio 02/06/2018 15  12 - 20   Final   ??? GFR est AA 02/06/2018 >60  >60 ml/min/1.63m Final   ??? GFR est non-AA 02/06/2018 >60  >60 ml/min/1.750mFinal    Comment: (NOTE)  Estimated GFR is calculated using the Modification of Diet in Renal   Disease (MDRD) Study equation, reported for both African Americans   (GFRAA) and non-African Americans (GFRNA), and normalized to 1.7339m body surface area. The physician must decide which value applies to   the patient. The MDRD study equation should only be used in   individuals age 76 21 older. It has not been validated for the   following: pregnant women, patients with serious comorbid conditions,   or on certain medications, or persons with extremes of body size,   muscle mass, or nutritional status.     ??? Calcium 02/06/2018 9.0  8.5 - 10.1 MG/DL Final   ??? Bilirubin, total 02/06/2018 0.3  0.2 - 1.0 MG/DL Final   ??? ALT (SGPT) 02/06/2018 18  13 - 56 U/L Final   ??? AST (SGOT) 02/06/2018 15  15 - 37 U/L Final   ??? Alk. phosphatase 02/06/2018 80  45 - 117 U/L Final   ??? Protein, total 02/06/2018 8.3* 6.4 - 8.2 g/dL Final   ??? Albumin 02/06/2018 4.1  3.4 - 5.0 g/dL Final   ??? Globulin 02/06/2018 4.2* 2.0 - 4.0 g/dL Final   ??? A-G Ratio 02/06/2018 1.0  0.8 - 1.7   Final   ??? TSH 02/06/2018 1.48  0.36 - 3.74 uIU/mL Final   Hospital Outpatient Visit on 11/28/2017   Component Date Value Ref Range Status   ??? WBC 11/28/2017 5.8  4.5 - 13.0 K/uL Final   ??? RBC 11/28/2017 4.56  4.10 - 5.10 M/uL Final   ??? HGB 11/28/2017 12.1  12.0 - 16.0 g/dL Final   ??? HCT 11/28/2017 37.4  36 - 48 % Final   ??? MCV 11/28/2017 82.0  78 - 102 FL Final   ??? MCH 11/28/2017 26.5  25.0 - 35.0 PG Final   ??? MCHC 11/28/2017 32.4  31 - 37 g/dL Final   ??? RDW 11/28/2017 17.7* 11.5 - 14.5 % Final   ??? PLATELET 11/28/2017 239  140 - 440 K/uL Final   ??? NEUTROPHILS 11/28/2017 52  40 - 70 % Final   ??? MIXED CELLS 11/28/2017 5  0.1 - 17 % Final    ???  LYMPHOCYTES 11/28/2017 43  14 - 44 % Final   ??? ABS. NEUTROPHILS 11/28/2017 3.0  1.8 - 9.5 K/UL Final   ??? ABS. MIXED CELLS 11/28/2017 0.3  0.0 - 2.3 K/uL Final   ??? ABS. LYMPHOCYTES 11/28/2017 2.5  1.1 - 5.9 K/UL Final    Test performed at [place] or Outpatient Infusion Center Location. Reviewed by Medical Director.   ??? DF 11/28/2017 AUTOMATED    Final   Office Visit on 11/28/2017   Component Date Value Ref Range Status   ??? Glucose 11/28/2017 122* 70 - 99 mg/dL Final   ??? BUN 11/28/2017 9  6 - 22 mg/dL Final   ??? Creatinine 11/28/2017 0.6  0.5 - 1.2 mg/dL Final   ??? Sodium 11/28/2017 141  133 - 145 mmol/L Final   ??? Potassium 11/28/2017 4.2  3.5 - 5.5 mmol/L Final   ??? Chloride 11/28/2017 102  98 - 110 mmol/L Final   ??? CO2 11/28/2017 22  20 - 32 mmol/L Final   ??? AST (SGOT) 11/28/2017 10  10 - 37 U/L Final   ??? ALT (SGPT) 11/28/2017 13  5 - 40 U/L Final   ??? Alk. phosphatase 11/28/2017 69  25 - 115 U/L Final   ??? Bilirubin, total 11/28/2017 0.2  0.2 - 1.2 mg/dL Final   ??? Calcium 11/28/2017 9.3  8.4 - 10.5 mg/dL Final   ??? Protein, total 11/28/2017 7.0  6.4 - 8.3 g/dL Final   ??? Albumin 11/28/2017 4.1  3.5 - 5.0 g/dL Final   ??? A-G Ratio 11/28/2017 1.4  1.1 - 2.6 ratio Final   ??? Globulin 11/28/2017 2.9  2.0 - 4.0 g/dL Final   ??? Anion gap 11/28/2017 17.0  mmol/L Final    Comment: Test includes Albumin, Alkaline Phosphatase, ALT, AST, BUN, Calcium, CO2,  Chloride, Creatinine, Glucose, Potassium, Sodium, Total Bilirubin and Total  Protein.  Estimated GFR results are reported in mL/min/1.73 sq.m. by the MDRD equation.  This eGFR is validated for stable chronic renal failure patients. This   equation  is unreliable in acute illness or patients with normal renal function.     ??? GFRAA 11/28/2017 >60.0  >60.0 Final   ??? GFRNA 11/28/2017 >60.0  >60.0 Final   ??? Ferritin 11/28/2017 104  10 - 291 ng/mL Final   ??? Iron 11/28/2017 46  30 - 160 mcg/dL Final    ??? UIBC 11/28/2017 212  110 - 370 mcg/dL Final   ??? TIBC 11/28/2017 258  228 - 428 mcg/dL Final   ??? Iron % saturation 11/28/2017 18* 20 - 50 % Final   ??? Sed rate (ESR) 11/28/2017 75* 0 - 30 mm/hr Final   Office Visit on 11/26/2017   Component Date Value Ref Range Status   ??? Hemoglobin (POC) 11/26/2017 5.9   Final       .  Results for orders placed or performed during the hospital encounter of 02/06/18   CBC WITH AUTOMATED DIFF   Result Value Ref Range    WBC 4.7 4.6 - 13.2 K/uL    RBC 5.22 4.20 - 5.30 M/uL    HGB 13.5 12.0 - 16.0 g/dL    HCT 41.7 35.0 - 45.0 %    MCV 79.9 74.0 - 97.0 FL    MCH 25.9 24.0 - 34.0 PG    MCHC 32.4 31.0 - 37.0 g/dL    RDW 16.5 (H) 11.6 - 14.5 %    PLATELET 241 135 - 420  K/uL    MPV 10.6 9.2 - 11.8 FL    NEUTROPHILS 46 40 - 73 %    LYMPHOCYTES 42 21 - 52 %    MONOCYTES 10 3 - 10 %    EOSINOPHILS 2 0 - 5 %    BASOPHILS 0 0 - 2 %    ABS. NEUTROPHILS 2.2 1.8 - 8.0 K/UL    ABS. LYMPHOCYTES 2.0 0.9 - 3.6 K/UL    ABS. MONOCYTES 0.5 0.05 - 1.2 K/UL    ABS. EOSINOPHILS 0.1 0.0 - 0.4 K/UL    ABS. BASOPHILS 0.0 0.0 - 0.1 K/UL    DF AUTOMATED     METABOLIC PANEL, COMPREHENSIVE   Result Value Ref Range    Sodium 141 136 - 145 mmol/L    Potassium 4.1 3.5 - 5.5 mmol/L    Chloride 110 (H) 100 - 108 mmol/L    CO2 24 21 - 32 mmol/L    Anion gap 7 3.0 - 18 mmol/L    Glucose 89 74 - 99 mg/dL    BUN 12 7.0 - 18 MG/DL    Creatinine 0.78 0.6 - 1.3 MG/DL    BUN/Creatinine ratio 15 12 - 20      GFR est AA >60 >60 ml/min/1.67m    GFR est non-AA >60 >60 ml/min/1.718m   Calcium 9.0 8.5 - 10.1 MG/DL    Bilirubin, total 0.3 0.2 - 1.0 MG/DL    ALT (SGPT) 18 13 - 56 U/L    AST (SGOT) 15 15 - 37 U/L    Alk. phosphatase 80 45 - 117 U/L    Protein, total 8.3 (H) 6.4 - 8.2 g/dL    Albumin 4.1 3.4 - 5.0 g/dL    Globulin 4.2 (H) 2.0 - 4.0 g/dL    A-G Ratio 1.0 0.8 - 1.7     TSH 3RD GENERATION   Result Value Ref Range    TSH 1.48 0.36 - 3.74 uIU/mL       Assessment / Plan:      ICD-10-CM ICD-9-CM     1. Prediabetes R73.03 790.29    2. Essential hypertension I10 401.9 CBC WITH AUTOMATED DIFF      METABOLIC PANEL, COMPREHENSIVE   3. Class 1 obesity due to excess calories with serious comorbidity and body mass index (BMI) of 33.0 to 33.9 in adult E66.09 278.00     Z68.33 V85.33    4. Screening for thyroid disorder Z13.29 V77.0 TSH 3RD GENERATION     HTN- controlled. No change to current treatment plan  Prediabetes- last Hgb A1C was 5.9  CBC, CMP, TSH  F/u in 4 months    Follow-up and Dispositions    ?? Return in about 4 months (around 06/08/2018).         I asked the patient if she  had any questions and answered her  questions.  The patient stated that she understands the treatment plan and agrees with the treatment plan    This document was created with a voice activated dictation system and may contain transcription errors.

## 2018-02-06 NOTE — Progress Notes (Signed)
Please notify patient that lab results were reviewed and the results are ok

## 2018-02-06 NOTE — Progress Notes (Signed)
Melissa Leblanc is a  52 y.o. female presents today for office visit for routine follow up.     1. Have you been to the ER, urgent care clinic or hospitalized since your last visit?NO .     2. Have you seen or consulted any other health care providers outside of the [facility] since your last visit (Include any pap smears or colon screening)?NO

## 2018-02-06 NOTE — Progress Notes (Signed)
Melissa Leblanc is a 52 y.o. female presenting today for Hypertension (F/U)  .       HPI:  Hunter Pinkard presents to the office today for hypertension follow-up car.  Patient presents today without any complaints of pain. She denies any chest pain, palpitation or lower extremity edema.  She has a previous history of Hodgkin's disease, arthritis and GERD.  She is current taking Omeprazole for GERD and her symptoms have improved  She is negative for any cough, wheezing or dyspnea.  Patient also has a history of prediabetes her last hemoglobin A1c in April, 2019 was 5.9.  She denies any polyuria, polydipsia polyphagia.    Review of Systems   Respiratory: Negative for cough.    Cardiovascular: Negative for chest pain and palpitations.   Gastrointestinal: Negative for abdominal pain, nausea and vomiting.   Neurological: Negative for dizziness and headaches.       Allergies   Allergen Reactions   ??? Vicodin [Hydrocodone-Acetaminophen] Itching       Current Outpatient Medications   Medication Sig Dispense Refill   ??? Omeprazole delayed release (PRILOSEC D/R) 20 mg tablet Take 1 Tab by mouth daily. 30 Tab 1   ??? lisinopril-hydroCHLOROthiazide (PRINZIDE, ZESTORETIC) 20-25 mg per tablet take 1 tablet by mouth once daily 90 Tab 3   ??? potassium chloride (KLOR-CON) 10 mEq tablet Take 1 Tab by mouth daily. 90 Tab 3   ??? furosemide (LASIX) 20 mg tablet Take 1 Tab by mouth daily as needed. 90 Tab 3   ??? cyclobenzaprine (FLEXERIL) 5 mg tablet Take 1 Tab by mouth three (3) times daily as needed for Muscle Spasm(s). 63 Tab 3       Past Medical History:   Diagnosis Date   ??? Anxiety    ??? Arthritis 10/17/2015   ??? Asthma    ??? Bladder incontinence    ??? Cancer [facility] [place])     lymph node cancer   ??? Depression 08/17/2014   ??? Diabetes (HCC)     border line diabetes   ??? GERD (gastroesophageal reflux disease)    ??? Hypertension    ??? Lymphoma ([place])    ??? Neck mass    ??? Prediabetes    ??? VAIN I (vaginal intraepithelial neoplasia grade I)        Past Surgical History:    Procedure Laterality Date   ??? HX GYN      BTL   ??? HX HYSTERECTOMY  2004   ??? HX OTHER SURGICAL      lymph node biopsies neck   ??? HX VASCULAR ACCESS      double right chest mediport   ??? PR RMVL TUN CTR VAD W/SUBQ PORT/PMP CTR/PRPH INSJ N/A 03/14/2016    Dr. Jimmye Norman       Social History     Socioeconomic History   ??? Marital status: SINGLE     Spouse name: Not on file   ??? Number of children: Not on file   ??? Years of education: Not on file   ??? Highest education level: Not on file   Occupational History   ??? Not on file   Social Needs   ??? Financial resource strain: Not on file   ??? Food insecurity:     Worry: Not on file     Inability: Not on file   ??? Transportation needs:     Medical: Not on file     Non-medical: Not on file   Tobacco Use   ??? Smoking  status: Current Every Day Smoker     Packs/day: 0.50     Years: 28.00     Pack years: 14.00   ??? Smokeless tobacco: Never Used   Substance and Sexual Activity   ??? Alcohol use: Yes     Alcohol/week: 1.8 - 2.4 oz     Types: 3 - 4 Glasses of wine per week     Comment: every 3 to 4 days   ??? Drug use: Yes     Types: Marijuana     Comment: one or 2 puffs occasionally (none now as of 03/11/16 per patient)   ??? Sexual activity: Never   Lifestyle   ??? Physical activity:     Days per week: Not on file     Minutes per session: Not on file   ??? Stress: Not on file   Relationships   ??? Social connections:     Talks on phone: Not on file     Gets together: Not on file     Attends religious service: Not on file     Active member of club or organization: Not on file     Attends meetings of clubs or organizations: Not on file     Relationship status: Not on file   ??? Intimate partner violence:     Fear of current or ex partner: Not on file     Emotionally abused: Not on file     Physically abused: Not on file     Forced sexual activity: Not on file   Other Topics Concern   ??? Not on file   Social History Narrative   ??? Not on file       Patient does not have an advanced directive on file    Vitals:     02/06/18 0937   BP: 133/78   Pulse: 81   Resp: 18   Temp: 98 ??F (36.7 ??C)   TempSrc: Oral   SpO2: 100%   Weight: 211 lb 12.8 oz (96.1 kg)   Height: '5\' 5"'$  (1.651 m)   PainSc:   0 - No pain       Physical Exam   Constitutional: No distress.   Cardiovascular: Normal rate, regular rhythm and normal heart sounds.   Pulmonary/Chest: Effort normal and breath sounds normal.   Neurological: She is alert.   Nursing note and vitals reviewed.      Hospital Outpatient Visit on 02/06/2018   Component Date Value Ref Range Status   ??? WBC 02/06/2018 4.7  4.6 - 13.2 K/uL Final   ??? RBC 02/06/2018 5.22  4.20 - 5.30 M/uL Final   ??? HGB 02/06/2018 13.5  12.0 - 16.0 g/dL Final   ??? HCT 02/06/2018 41.7  35.0 - 45.0 % Final   ??? MCV 02/06/2018 79.9  74.0 - 97.0 FL Final   ??? MCH 02/06/2018 25.9  24.0 - 34.0 PG Final   ??? MCHC 02/06/2018 32.4  31.0 - 37.0 g/dL Final   ??? RDW 02/06/2018 16.5* 11.6 - 14.5 % Final   ??? PLATELET 02/06/2018 241  135 - 420 K/uL Final   ??? MPV 02/06/2018 10.6  9.2 - 11.8 FL Final   ??? NEUTROPHILS 02/06/2018 46  40 - 73 % Final   ??? LYMPHOCYTES 02/06/2018 42  21 - 52 % Final   ??? MONOCYTES 02/06/2018 10  3 - 10 % Final   ??? EOSINOPHILS 02/06/2018 2  0 - 5 % Final   ??? BASOPHILS 02/06/2018 0  0 - 2 % Final   ??? ABS. NEUTROPHILS 02/06/2018 2.2  1.8 - 8.0 K/UL Final   ??? ABS. LYMPHOCYTES 02/06/2018 2.0  0.9 - 3.6 K/UL Final   ??? ABS. MONOCYTES 02/06/2018 0.5  0.05 - 1.2 K/UL Final   ??? ABS. EOSINOPHILS 02/06/2018 0.1  0.0 - 0.4 K/UL Final   ??? ABS. BASOPHILS 02/06/2018 0.0  0.0 - 0.1 K/UL Final   ??? DF 02/06/2018 AUTOMATED    Final   ??? Sodium 02/06/2018 141  136 - 145 mmol/L Final   ??? Potassium 02/06/2018 4.1  3.5 - 5.5 mmol/L Final   ??? Chloride 02/06/2018 110* 100 - 108 mmol/L Final   ??? CO2 02/06/2018 24  21 - 32 mmol/L Final   ??? Anion gap 02/06/2018 7  3.0 - 18 mmol/L Final   ??? Glucose 02/06/2018 89  74 - 99 mg/dL Final   ??? BUN 02/06/2018 12  7.0 - 18 MG/DL Final   ??? Creatinine 02/06/2018 0.78  0.6 - 1.3 MG/DL Final   ???  BUN/Creatinine ratio 02/06/2018 15  12 - 20   Final   ??? GFR est AA 02/06/2018 >60  >60 ml/min/1.73m Final   ??? GFR est non-AA 02/06/2018 >60  >60 ml/min/1.726mFinal    Comment: (NOTE)  Estimated GFR is calculated using the Modification of Diet in Renal   Disease (MDRD) Study equation, reported for both African Americans   (GFRAA) and non-African Americans (GFRNA), and normalized to 1.7368m body surface area. The physician must decide which value applies to   the patient. The MDRD study equation should only be used in   individuals age 4 88 older. It has not been validated for the   following: pregnant women, patients with serious comorbid conditions,   or on certain medications, or persons with extremes of body size,   muscle mass, or nutritional status.     ??? Calcium 02/06/2018 9.0  8.5 - 10.1 MG/DL Final   ??? Bilirubin, total 02/06/2018 0.3  0.2 - 1.0 MG/DL Final   ??? ALT (SGPT) 02/06/2018 18  13 - 56 U/L Final   ??? AST (SGOT) 02/06/2018 15  15 - 37 U/L Final   ??? Alk. phosphatase 02/06/2018 80  45 - 117 U/L Final   ??? Protein, total 02/06/2018 8.3* 6.4 - 8.2 g/dL Final   ??? Albumin 02/06/2018 4.1  3.4 - 5.0 g/dL Final   ??? Globulin 02/06/2018 4.2* 2.0 - 4.0 g/dL Final   ??? A-G Ratio 02/06/2018 1.0  0.8 - 1.7   Final   ??? TSH 02/06/2018 1.48  0.36 - 3.74 uIU/mL Final   Hospital Outpatient Visit on 11/28/2017   Component Date Value Ref Range Status   ??? WBC 11/28/2017 5.8  4.5 - 13.0 K/uL Final   ??? RBC 11/28/2017 4.56  4.10 - 5.10 M/uL Final   ??? HGB 11/28/2017 12.1  12.0 - 16.0 g/dL Final   ??? HCT 11/28/2017 37.4  36 - 48 % Final   ??? MCV 11/28/2017 82.0  78 - 102 FL Final   ??? MCH 11/28/2017 26.5  25.0 - 35.0 PG Final   ??? MCHC 11/28/2017 32.4  31 - 37 g/dL Final   ??? RDW 11/28/2017 17.7* 11.5 - 14.5 % Final   ??? PLATELET 11/28/2017 239  140 - 440 K/uL Final   ??? NEUTROPHILS 11/28/2017 52  40 - 70 % Final   ??? MIXED CELLS 11/28/2017 5  0.1 - 17 % Final   ???  LYMPHOCYTES 11/28/2017 43  14 - 44 % Final   ??? ABS. NEUTROPHILS  11/28/2017 3.0  1.8 - 9.5 K/UL Final   ??? ABS. MIXED CELLS 11/28/2017 0.3  0.0 - 2.3 K/uL Final   ??? ABS. LYMPHOCYTES 11/28/2017 2.5  1.1 - 5.9 K/UL Final    Test performed at [facility] or Outpatient Infusion Center Location. Reviewed by Medical Director.   ??? DF 11/28/2017 AUTOMATED    Final   Office Visit on 11/28/2017   Component Date Value Ref Range Status   ??? Glucose 11/28/2017 122* 70 - 99 mg/dL Final   ??? BUN 11/28/2017 9  6 - 22 mg/dL Final   ??? Creatinine 11/28/2017 0.6  0.5 - 1.2 mg/dL Final   ??? Sodium 11/28/2017 141  133 - 145 mmol/L Final   ??? Potassium 11/28/2017 4.2  3.5 - 5.5 mmol/L Final   ??? Chloride 11/28/2017 102  98 - 110 mmol/L Final   ??? CO2 11/28/2017 22  20 - 32 mmol/L Final   ??? AST (SGOT) 11/28/2017 10  10 - 37 U/L Final   ??? ALT (SGPT) 11/28/2017 13  5 - 40 U/L Final   ??? Alk. phosphatase 11/28/2017 69  25 - 115 U/L Final   ??? Bilirubin, total 11/28/2017 0.2  0.2 - 1.2 mg/dL Final   ??? Calcium 11/28/2017 9.3  8.4 - 10.5 mg/dL Final   ??? Protein, total 11/28/2017 7.0  6.4 - 8.3 g/dL Final   ??? Albumin 11/28/2017 4.1  3.5 - 5.0 g/dL Final   ??? A-G Ratio 11/28/2017 1.4  1.1 - 2.6 ratio Final   ??? Globulin 11/28/2017 2.9  2.0 - 4.0 g/dL Final   ??? Anion gap 11/28/2017 17.0  mmol/L Final    Comment: Test includes Albumin, Alkaline Phosphatase, ALT, AST, BUN, Calcium, CO2,  Chloride, Creatinine, Glucose, Potassium, Sodium, Total Bilirubin and Total  Protein.  Estimated GFR results are reported in mL/min/1.73 sq.m. by the MDRD equation.  This eGFR is validated for stable chronic renal failure patients. This   equation  is unreliable in acute illness or patients with normal renal function.     ??? GFRAA 11/28/2017 >60.0  >60.0 Final   ??? GFRNA 11/28/2017 >60.0  >60.0 Final   ??? Ferritin 11/28/2017 104  10 - 291 ng/mL Final   ??? Iron 11/28/2017 46  30 - 160 mcg/dL Final   ??? UIBC 11/28/2017 212  110 - 370 mcg/dL Final   ??? TIBC 11/28/2017 258  228 - 428 mcg/dL Final   ??? Iron %  saturation 11/28/2017 18* 20 - 50 % Final   ??? Sed rate (ESR) 11/28/2017 75* 0 - 30 mm/hr Final   Office Visit on 11/26/2017   Component Date Value Ref Range Status   ??? Hemoglobin (POC) 11/26/2017 5.9   Final       .  Results for orders placed or performed during the hospital encounter of 02/06/18   CBC WITH AUTOMATED DIFF   Result Value Ref Range    WBC 4.7 4.6 - 13.2 K/uL    RBC 5.22 4.20 - 5.30 M/uL    HGB 13.5 12.0 - 16.0 g/dL    HCT 41.7 35.0 - 45.0 %    MCV 79.9 74.0 - 97.0 FL    MCH 25.9 24.0 - 34.0 PG    MCHC 32.4 31.0 - 37.0 g/dL    RDW 16.5 (H) 11.6 - 14.5 %    PLATELET 241 135 -  420 K/uL    MPV 10.6 9.2 - 11.8 FL    NEUTROPHILS 46 40 - 73 %    LYMPHOCYTES 42 21 - 52 %    MONOCYTES 10 3 - 10 %    EOSINOPHILS 2 0 - 5 %    BASOPHILS 0 0 - 2 %    ABS. NEUTROPHILS 2.2 1.8 - 8.0 K/UL    ABS. LYMPHOCYTES 2.0 0.9 - 3.6 K/UL    ABS. MONOCYTES 0.5 0.05 - 1.2 K/UL    ABS. EOSINOPHILS 0.1 0.0 - 0.4 K/UL    ABS. BASOPHILS 0.0 0.0 - 0.1 K/UL    DF AUTOMATED     METABOLIC PANEL, COMPREHENSIVE   Result Value Ref Range    Sodium 141 136 - 145 mmol/L    Potassium 4.1 3.5 - 5.5 mmol/L    Chloride 110 (H) 100 - 108 mmol/L    CO2 24 21 - 32 mmol/L    Anion gap 7 3.0 - 18 mmol/L    Glucose 89 74 - 99 mg/dL    BUN 12 7.0 - 18 MG/DL    Creatinine 0.78 0.6 - 1.3 MG/DL    BUN/Creatinine ratio 15 12 - 20      GFR est AA >60 >60 ml/min/1.80m    GFR est non-AA >60 >60 ml/min/1.760m   Calcium 9.0 8.5 - 10.1 MG/DL    Bilirubin, total 0.3 0.2 - 1.0 MG/DL    ALT (SGPT) 18 13 - 56 U/L    AST (SGOT) 15 15 - 37 U/L    Alk. phosphatase 80 45 - 117 U/L    Protein, total 8.3 (H) 6.4 - 8.2 g/dL    Albumin 4.1 3.4 - 5.0 g/dL    Globulin 4.2 (H) 2.0 - 4.0 g/dL    A-G Ratio 1.0 0.8 - 1.7     TSH 3RD GENERATION   Result Value Ref Range    TSH 1.48 0.36 - 3.74 uIU/mL       Assessment / Plan:      ICD-10-CM ICD-9-CM    1. Prediabetes R73.03 790.29    2. Essential hypertension I10 401.9 CBC WITH AUTOMATED DIFF      METABOLIC PANEL, COMPREHENSIVE   3.  Class 1 obesity due to excess calories with serious comorbidity and body mass index (BMI) of 33.0 to 33.9 in adult E66.09 278.00     Z68.33 V85.33    4. Screening for thyroid disorder Z13.29 V77.0 TSH 3RD GENERATION     HTN- controlled. No change to current treatment plan  Prediabetes- last Hgb A1C was 5.9  CBC, CMP, TSH  F/u in 4 months    Follow-up and Dispositions    ?? Return in about 4 months (around 06/08/2018).         I asked the patient if she  had any questions and answered her  questions.  The patient stated that she understands the treatment plan and agrees with the treatment plan    This document was created with a voice activated dictation system and may contain transcription errors.

## 2018-02-06 NOTE — Progress Notes (Signed)
Melissa Leblanc is a  52 y.o. female presents today for office visit for routine follow up.     1. Have you been to the ER, urgent care clinic or hospitalized since your last visit?NO .     2. Have you seen or consulted any other health care providers outside of the [facility] since your last visit (Include any pap smears or colon screening)?NO

## 2018-02-07 LAB — COMPREHENSIVE METABOLIC PANEL
ALT: 18 U/L (ref 13–56)
AST: 15 U/L (ref 15–37)
Albumin/Globulin Ratio: 1 (ref 0.8–1.7)
Albumin: 4.1 g/dL (ref 3.4–5.0)
Alkaline Phosphatase: 80 U/L (ref 45–117)
Anion Gap: 7 mmol/L (ref 3.0–18)
BUN: 12 MG/DL (ref 7.0–18)
Bun/Cre Ratio: 15 (ref 12–20)
CO2: 24 mmol/L (ref 21–32)
Calcium: 9 MG/DL (ref 8.5–10.1)
Chloride: 110 mmol/L — ABNORMAL HIGH (ref 100–108)
Creatinine: 0.78 MG/DL (ref 0.6–1.3)
EGFR IF NonAfrican American: >60 mL/min/{1.73_m2} (ref >60)
GFR African American: >60 mL/min/{1.73_m2} (ref >60)
Globulin: 4.2 g/dL — ABNORMAL HIGH (ref 2.0–4.0)
Glucose: 89 mg/dL (ref 74–99)
Potassium: 4.1 mmol/L (ref 3.5–5.5)
Sodium: 141 mmol/L (ref 136–145)
Total Bilirubin: 0.3 MG/DL (ref 0.2–1.0)
Total Protein: 8.3 g/dL — ABNORMAL HIGH (ref 6.4–8.2)

## 2018-02-07 LAB — TSH 3RD GENERATION
TSH: 1.48 u[IU]/mL (ref 0.36–3.74)
TSH: 1.48 u[IU]/mL (ref 0.36–3.74)

## 2018-02-07 LAB — METABOLIC PANEL, COMPREHENSIVE
A-G Ratio: 1 (ref 0.8–1.7)
ALT (SGPT): 18 U/L (ref 13–56)
AST (SGOT): 15 U/L (ref 15–37)
Albumin: 4.1 g/dL (ref 3.4–5.0)
Alk. phosphatase: 80 U/L (ref 45–117)
Anion gap: 7 mmol/L (ref 3.0–18)
BUN/Creatinine ratio: 15 (ref 12–20)
BUN: 12 MG/DL (ref 7.0–18)
Bilirubin, total: 0.3 MG/DL (ref 0.2–1.0)
CO2: 24 mmol/L (ref 21–32)
Calcium: 9 MG/DL (ref 8.5–10.1)
Chloride: 110 mmol/L — ABNORMAL HIGH (ref 100–108)
Creatinine: 0.78 MG/DL (ref 0.6–1.3)
GFR est AA: >60 mL/min/{1.73_m2} (ref >60)
GFR est non-AA: >60 mL/min/{1.73_m2} (ref >60)
Globulin: 4.2 g/dL — ABNORMAL HIGH (ref 2.0–4.0)
Glucose: 89 mg/dL (ref 74–99)
Potassium: 4.1 mmol/L (ref 3.5–5.5)
Protein, total: 8.3 g/dL — ABNORMAL HIGH (ref 6.4–8.2)
Sodium: 141 mmol/L (ref 136–145)

## 2018-04-03 ENCOUNTER — Encounter: Attending: Hematology & Oncology | Primary: Family

## 2018-06-05 ENCOUNTER — Encounter: Attending: Family | Primary: Family

## 2018-07-14 ENCOUNTER — Ambulatory Visit: Attending: Hematology & Oncology | Primary: Family

## 2018-07-14 ENCOUNTER — Inpatient Hospital Stay: Admit: 2018-07-14 | Payer: MEDICARE | Primary: Family

## 2018-07-14 ENCOUNTER — Inpatient Hospital Stay: Admit: 2018-07-14 | Primary: Family

## 2018-07-14 ENCOUNTER — Ambulatory Visit: Admit: 2018-07-14 | Discharge: 2018-07-14 | Payer: MEDICARE | Attending: Hematology & Oncology | Primary: Family

## 2018-07-14 DIAGNOSIS — D72819 Decreased white blood cell count, unspecified: Secondary | ICD-10-CM

## 2018-07-14 LAB — CBC WITH 3 PART DIFF
ABS. LYMPHOCYTES: 2.5 10*3/uL (ref 1.1–5.9)
ABS. MIXED CELLS: 0.4 10*3/uL (ref 0.0–2.3)
ABS. NEUTROPHILS: 2.9 10*3/uL (ref 1.8–9.5)
ABSOLUTE MIXED CELLS: 0.4 10*3/uL (ref 0.0–2.3)
HCT: 40 % (ref 36–48)
HGB: 12.6 g/dL (ref 12.0–16.0)
Hematocrit: 40 % (ref 36–48)
Hemoglobin: 12.6 g/dL (ref 12.0–16.0)
LYMPHOCYTES: 42 % (ref 14–44)
Lymphocytes %: 42 % (ref 14–44)
Lymphocytes Absolute: 2.5 10*3/uL (ref 1.1–5.9)
MCH: 25.6 PG (ref 25.0–35.0)
MCH: 25.6 PG (ref 25.0–35.0)
MCHC: 31.5 g/dL (ref 31–37)
MCHC: 31.5 g/dL (ref 31–37)
MCV: 81.1 FL (ref 78–102)
MCV: 81.1 FL (ref 78–102)
Mixed Cells: 7 % (ref 0.1–17)
Mixed cells: 7 % (ref 0.1–17)
NEUTROPHILS: 51 % (ref 40–70)
Neutrophils %: 51 % (ref 40–70)
Neutrophils Absolute: 2.9 10*3/uL (ref 1.8–9.5)
PLATELET: 266 10*3/uL (ref 140–440)
Platelets: 266 10*3/uL (ref 140–440)
RBC: 4.93 M/uL (ref 4.10–5.10)
RBC: 4.93 M/uL (ref 4.10–5.10)
RDW: 16.7 % — ABNORMAL HIGH (ref 11.5–14.5)
RDW: 16.7 % — ABNORMAL HIGH (ref 11.5–14.5)
WBC: 5.8 10*3/uL (ref 4.5–13.0)
WBC: 5.8 10*3/uL (ref 4.5–13.0)

## 2018-07-14 NOTE — Patient Instructions (Addendum)
Anemia: Care Instructions  Your Care Instructions    Anemia is a low level of red blood cells, which carry oxygen throughout your body. Many things can cause anemia. Lack of iron is one of the most common causes. Your body needs iron to make hemoglobin, a substance in red blood cells that carries oxygen from the lungs to your body's cells. Without enough iron, the body produces fewer and smaller red blood cells. As a result, your body's cells do not get enough oxygen, and you feel tired and weak. And you may have trouble concentrating.  Bleeding is the most common cause of a lack of iron. You may have heavy menstrual bleeding or bleeding caused by conditions such as ulcers, hemorrhoids, or cancer. Regular use of aspirin or other anti-inflammatory medicines (such as ibuprofen) also can cause bleeding in some people. A lack of iron in your diet also can cause anemia, especially at times when the body needs more iron, such as during pregnancy, infancy, and the teen years.  Your doctor may have prescribed iron pills. It may take several months of treatment for your iron levels to return to normal. Your doctor also may suggest that you eat foods that are rich in iron, such as meat and beans.  There are many other causes of anemia. It is not always due to a lack of iron. Finding the specific cause of your anemia will help your doctor find the right treatment for you.  Follow-up care is a key part of your treatment and safety. Be sure to make and go to all appointments, and call your doctor if you are having problems. It's also a good idea to know your test results and keep a list of the medicines you take.  How can you care for yourself at home?  ?? Take your medicines exactly as prescribed. Call your doctor if you think you are having a problem with your medicine.  ?? If your doctor recommends iron pills, take them as directed:  ? Try to take the pills on an empty stomach about 1 hour before or 2 hours  after meals. But you may need to take iron with food to avoid an upset stomach.  ? Do not take antacids or drink milk or caffeine drinks (such as coffee, tea, or cola) at the same time or within 2 hours of the time that you take your iron. They can make it hard for your body to absorb the iron.  ? Vitamin C (from food or supplements) helps your body absorb iron. Try taking iron pills with a glass of orange juice or some other food that is high in vitamin C, such as citrus fruits.  ? Iron pills may cause stomach problems, such as heartburn, nausea, diarrhea, constipation, and cramps. Be sure to drink plenty of fluids, and include fruits, vegetables, and fiber in your diet each day. Iron pills often make your bowel movements dark or green.  ? If you forget to take an iron pill, do not take a double dose of iron the next time you take a pill.  ? Keep iron pills out of the reach of small children. An overdose of iron can be very dangerous.  ?? Follow your doctor's advice about eating iron-rich foods. These include red meat, shellfish, poultry, eggs, beans, raisins, whole-grain bread, and leafy green vegetables.  ?? Steam vegetables to help them keep their iron content.  When should you call for help?  Call 911 anytime you think you   may need emergency care. For example, call if:  ?? ?? You have symptoms of a heart attack. These may include:  ? Chest pain or pressure, or a strange feeling in the chest.  ? Sweating.  ? Shortness of breath.  ? Nausea or vomiting.  ? Pain, pressure, or a strange feeling in the back, neck, jaw, or upper belly or in one or both shoulders or arms.  ? Lightheadedness or sudden weakness.  ? A fast or irregular heartbeat.  After you call 911, the operator may tell you to chew 1 adult-strength or 2 to 4 low-dose aspirin. Wait for an ambulance. Do not try to drive yourself.   ?? ?? You passed out (lost consciousness).   ??Call your doctor now or seek immediate medical care if:   ?? ?? You have new or increased shortness of breath.   ?? ?? You are dizzy or lightheaded, or you feel like you may faint.   ?? ?? Your fatigue and weakness continue or get worse.   ?? ?? You have any abnormal bleeding, such as:  ? Nosebleeds.  ? Vaginal bleeding that is different (heavier, more frequent, at a different time of the month) than what you are used to.  ? Bloody or black stools, or rectal bleeding.  ? Bloody or pink urine.   ??Watch closely for changes in your health, and be sure to contact your doctor if:  ?? ?? You do not get better as expected.   Where can you learn more?  Go to [email or web address].  Enter R301 in the search box to learn more about "Anemia: Care Instructions."  Current as of: November 13, 2017  Content Version: 12.2  ?? 2006-2019 [employer]. Care instructions adapted under license by [employer] (which disclaims liability or warranty for this information). If you have questions about a medical condition or this instruction, always ask your healthcare professional. [place] any warranty or liability for your use of this information.         Hodgkin Lymphoma: Care Instructions  Your Care Instructions    Hodgkin lymphoma is a type of cancer that affects part of the immune system (lymph system). Cells normally found in the lymph nodes and spleen can increase in number and collect in areas where they cause problems. Hodgkin lymphoma is not contagious and is not caused by an injury.  Treatment for Hodgkin lymphoma depends on the stage of the lymphoma and what type of lymphoma you have. It is usually treated with medicines called chemotherapy. You may also need radiation treatments or a procedure called a bone marrow transplant. Or you may have targeted therapy. Your doctor will talk to you about what kind of treatment may be best for you.  When you find out that you have cancer, you may feel many emotions and may  need some help coping. Seek out family, friends, and counselors for support. You also can do things at home to make yourself feel better while you go through treatment. Call the [facility] [phone]) or visit its website at [email or web address] for more information.  Follow-up care is a key part of your treatment and safety. Be sure to make and go to all appointments, and call your doctor if you are having problems. It's also a good idea to know your test results and keep a list of the medicines you take.  How can you care for yourself at home?  ?? Take your medicines exactly as prescribed. Call  your doctor if you think you are having a problem with your medicine. You may get medicine for nausea and vomiting if you have these side effects.  ?? Eat healthy food. If you do not feel like eating, try to eat food that has protein and extra calories to keep up your strength and prevent weight loss. Drink liquid meal replacements for extra calories and protein. Try to eat your main meal early.  ?? Get some physical activity every day, but do not get too tired. Keep doing the hobbies you enjoy as your energy allows.  ?? Take steps to control your stress and workload. Learn relaxation techniques.  ? Share your feelings. Stress and tension affect our emotions. By expressing your feelings to others, you may be able to understand and cope with them.  ? Consider joining a support group. Talking about a problem with your spouse, a good friend, or other people with similar problems is a good way to reduce tension and stress.  ? Express yourself through art. Try writing, crafts, dance, or art to relieve stress. Some dance, writing, or art groups may be available just for people who have cancer.  ? Be kind to your body and mind. Getting enough sleep, eating a healthy diet, and taking time to do things you enjoy can contribute to an overall feeling of balance in your life and can help reduce stress.   ? Get help if you need it. Discuss your concerns with your doctor or counselor.  ?? If you are vomiting or have diarrhea:  ? Drink plenty of fluids (enough so that your urine is light yellow or clear like water) to prevent dehydration. Choose water and other caffeine-free clear liquids. If you have kidney, heart, or liver disease and have to limit fluids, talk with your doctor before you increase the amount of fluids you drink.  ? When you are able to eat, try clear soups, mild foods, and liquids until all symptoms are gone for 12 to 48 hours. Other good choices include dry toast, crackers, cooked cereal, and gelatin dessert, such as Jell-O.  ?? If you have not already done so, prepare a list of advance directives. Advance directives are instructions to your doctor and family members about what kind of care you want if you become unable to speak or express yourself.  When should you call for help?  Call 911 anytime you think you may need emergency care. For example, call if:  ?? ?? You passed out (lost consciousness).   ??Call your doctor now or seek immediate medical care if:  ?? ?? You have a fever.   ?? ?? You have abnormal bleeding.   ?? ?? You have new or worse pain.   ?? ?? You think you have an infection.   ?? ?? You have new symptoms, such as a cough, belly pain, vomiting, diarrhea, or a rash.   ??Watch closely for changes in your health, and be sure to contact your doctor if:  ?? ?? You are much more tired than usual.   ?? ?? You have swollen glands in your armpits, groin, or neck.   ?? ?? You do not get better as expected.   Where can you learn more?  Go to [email or web address].  Enter [phone] in the search box to learn more about "Hodgkin Lymphoma: Care Instructions."  Current as of: August 06, 2017  Content Version: 12.2  ?? 2006-2019 [employer]. Care instructions adapted under license by [employer] (  which disclaims liability or warranty  for this information). If you have questions about a medical condition or this instruction, always ask your healthcare professional. [place] any warranty or liability for your use of this information.

## 2018-07-14 NOTE — Progress Notes (Signed)
Hematology/Oncology  Progress Note    Name: Melissa Leblanc  Date: 07/14/2018  DOB: 26-Feb-1966    XTG:GYIRS M. Starleen Blue, DO     Melissa Leblanc is a 52 year old female who was seen for management of her Hodgkin's lymphoma.    Current therapy: active surveillance, the patient previously completed 8 cycles of systemic chemotherapy with the ABVD regimen (2014)    Subjective:     Melissa Leblanc is a 52 year old African American woman has a history of Hodgkin's lymphoma. She has previously completed a full course of systemic chemotherapy with a combination of ABVD,  In 2014.  Today she reports having occasional night sweats but denies having fevers and lymphadenopathy. She states her appetite is excellent. She does not have any other concerns or complaints to report at this time.    Past medical history, family history, and social history: these were reviewed and remains unchanged.    Past Medical History:   Diagnosis Date   ??? Anxiety    ??? Arthritis 10/17/2015   ??? Asthma    ??? Bladder incontinence    ??? Cancer [facility])     lymph node cancer   ??? Depression 08/17/2014   ??? Diabetes (HCC)     border line diabetes   ??? GERD (gastroesophageal reflux disease)    ??? Hypertension    ??? Lymphoma ([place])    ??? Neck mass    ??? Prediabetes    ??? VAIN I (vaginal intraepithelial neoplasia grade I)      Past Surgical History:   Procedure Laterality Date   ??? HX GYN      BTL   ??? HX HYSTERECTOMY  2004   ??? HX OTHER SURGICAL      lymph node biopsies neck   ??? HX VASCULAR ACCESS      double right chest mediport   ??? PR RMVL TUN CTR VAD W/SUBQ PORT/PMP CTR/PRPH INSJ N/A 03/14/2016    Dr. Jimmye Norman     Social History     Socioeconomic History   ??? Marital status: SINGLE     Spouse name: Not on file   ??? Number of children: Not on file   ??? Years of education: Not on file   ??? Highest education level: Not on file   Occupational History   ??? Not on file   Social Needs   ??? Financial resource strain: Not on file   ??? Food insecurity:     Worry: Not on file     Inability: Not on file   ???  Transportation needs:     Medical: Not on file     Non-medical: Not on file   Tobacco Use   ??? Smoking status: Current Every Day Smoker     Packs/day: 0.50     Years: 28.00     Pack years: 14.00   ??? Smokeless tobacco: Never Used   Substance and Sexual Activity   ??? Alcohol use: Yes     Alcohol/week: 3.0 - 4.0 standard drinks     Types: 3 - 4 Glasses of wine per week     Comment: every 3 to 4 days   ??? Drug use: Yes     Types: Marijuana     Comment: one or 2 puffs occasionally (none now as of 03/11/16 per patient)   ??? Sexual activity: Never   Lifestyle   ??? Physical activity:     Days per week: Not on file     Minutes per session:  Not on file   ??? Stress: Not on file   Relationships   ??? Social connections:     Talks on phone: Not on file     Gets together: Not on file     Attends religious service: Not on file     Active member of club or organization: Not on file     Attends meetings of clubs or organizations: Not on file     Relationship status: Not on file   ??? Intimate partner violence:     Fear of current or ex partner: Not on file     Emotionally abused: Not on file     Physically abused: Not on file     Forced sexual activity: Not on file   Other Topics Concern   ??? Not on file   Social History Narrative   ??? Not on file     Family History   Problem Relation Age of Onset   ??? Cancer Mother         throat   ??? Breast Cancer Mother    ??? Colon Cancer Mother    ??? Hypertension Mother    ??? Colon Cancer Father    ??? Pacemaker Father    ??? Cancer Sister         thigh that moved to lung, sounds like myosarcoma?   ??? Hypertension Sister    ??? Alcohol abuse Neg Hx    ??? Arthritis-rheumatoid Neg Hx    ??? Asthma Neg Hx    ??? Bleeding Prob Neg Hx    ??? Diabetes Neg Hx    ??? Elevated Lipids Neg Hx    ??? Headache Neg Hx    ??? Lung Disease Neg Hx    ??? Migraines Neg Hx    ??? Psychiatric Disorder Neg Hx    ??? Stroke Neg Hx    ??? Mental Retardation Neg Hx      Current Outpatient Medications   Medication Sig Dispense Refill   ??? Omeprazole delayed release  (PRILOSEC D/R) 20 mg tablet Take 1 Tab by mouth daily. 30 Tab 1   ??? lisinopril-hydroCHLOROthiazide (PRINZIDE, ZESTORETIC) 20-25 mg per tablet take 1 tablet by mouth once daily 90 Tab 3   ??? potassium chloride (KLOR-CON) 10 mEq tablet Take 1 Tab by mouth daily. 90 Tab 3   ??? furosemide (LASIX) 20 mg tablet Take 1 Tab by mouth daily as needed. 90 Tab 3   ??? cyclobenzaprine (FLEXERIL) 5 mg tablet Take 1 Tab by mouth three (3) times daily as needed for Muscle Spasm(s). 90 Tab 3       Review of Systems  Constitutional: The patient has no acute distress or discomfort.  HEENT: The patient denies recent head trauma, eye pain, blurred vision,  hearing deficit, oropharyngeal mucosal pain or lesions, and the patient denies throat pain or discomfort.  Lymphatics: The patient denies palpable peripheral lymphadenopathy.  Hematologic: The patient denies having bruising, bleeding, or progressive fatigue.  Respiratory: the patient is complaining of some cough, low-grade fever and running nose.  Cardiovascular: The patient denies having leg pain, leg swelling, heart palpitations, chest permit, chest pain, or lightheadedness.  The patient denies having dyspnea on exertion.  Gastrointestinal: The patient denies having nausea, emesis, or diarrhea. The patient denies having any hematemesis or blood in the stool.  Genitourinary: Patient denies having urinary urgency, frequency, or dysuria.  The patient denies having blood in the urine.  Psychological: The patient denies having symptoms of nervousness, anxiety,  depression, or thoughts of harming herself  Or anybody .  Skin: Patient denies having skin rashes, skin, ulcerations, or unexplained itching or pruritus.  Musculoskeletal: The patient denies having pain in the joints or bones.      Objective:     Visit Vitals  BP 124/77   Pulse 89   Temp 98.9 ??F (37.2 ??C) (Oral)   Resp 16   Ht 5\' 5"  (1.651 m)   Wt 92.5 kg (204 lb)   LMP  (LMP Unknown)   SpO2 99%   BMI 33.95 kg/m??     ECOG PS=0, pain  score=0/10    Physical Exam:   Gen. Appearance: The patient is in no acute distress.  Skin: There is no bruise or rash.  HEENT: The exam is unremarkable, except for mild facial puffiness.  Neck: Supple without discrete lymphadenopathy but some fullness in his supraclavicular area is present.  Lungs: Clear to auscultation and percussion; there are no wheezes or rhonchi.  Heart: Regular rate and rhythm; there are no murmurs, gallops, or rubs.  Abdomen: Bowel sounds are present and normal.  There is no guarding, tenderness, or hepatosplenomegaly.  Extremities: There is no clubbing, cyanosis, or edema.  Neurologic: There are no focal neurologic deficits.  Lymphatics: There is no palpable peripheral lymphadenopathy. Musculoskeletal: The patient has full range of motion at all joints.  There is no evidence of joint deformity or effusions.  There is no focal joint tenderness.  Psychological/psychiatric: There is no clinical evidence of anxiety, depression, or melancholy.    Lab data:      Results for orders placed or performed during the hospital encounter of 07/14/18   CBC WITH 3 PART DIFF     Status: Abnormal   Result Value Ref Range Status    WBC 5.8 4.5 - 13.0 K/uL Final    RBC 4.93 4.10 - 5.10 M/uL Final    HGB 12.6 12.0 - 16.0 g/dL Final    HCT 40.0 36 - 48 % Final    MCV 81.1 78 - 102 FL Final    MCH 25.6 25.0 - 35.0 PG Final    MCHC 31.5 31 - 37 g/dL Final    RDW 16.7 (H) 11.5 - 14.5 % Final    PLATELET 266 140 - 440 K/uL Final    NEUTROPHILS 51 40 - 70 % Final    MIXED CELLS 7 0.1 - 17 % Final    LYMPHOCYTES 42 14 - 44 % Final    ABS. NEUTROPHILS 2.9 1.8 - 9.5 K/UL Final    ABS. MIXED CELLS 0.4 0.0 - 2.3 K/uL Final    ABS. LYMPHOCYTES 2.5 1.1 - 5.9 K/UL Final     Comment: Test performed at [place] or Outpatient Infusion [facility] Location. Reviewed by Medical Director.    DF AUTOMATED   Final           Assessment:     1. Chronic leukopenia    2. Hodgkin's disease of lymph  nodes of head, face, and neck (HCC)      Plan:     Leukopenia: Today her CBC shows her WBC is normal at 5.8.  Her absolute neutrophil count is 2.9 and absolute lymphocyte count is 2.5.  I have explained to the patient that her leukopenia has remained in the normal range.    Hodgkin's lymphoma: On 07/29/17 her Sed rate was normal at 7. On 01/22/2017 CT chest abdomen and pelvis showed there  is no evidence of any new lymphadenopathy or infiltrating lymphadenopathy in  chest, abdomen or in pelvis.  The most recent sedimentation rate was 75 mm an hour.  She is now having some complaints of night sweats.  Therefore I am going to schedule the patient for CT scans of the chest, abdomen, and pelvis.  I will see her back in clinic in 3 weeks. Sedimentation rate will be obtained at this time.     Iron deficiency anemia: She takes over-the-counter iron supplement once daily and this will be continued. Her current hemoglobin is 12.6 g/dL with hematocrit of 40 %. Iron Profile and Ferritin will be obtained at this time.    Chronic arthritis : I have recommended that the patient continue to use Aleve 1 tablet twice daily. She has previously been provided Percocet in the past for severe pain. Today she did not request for any Rx.    The patient will follow-up in the clinic in 3 weeks to review the CT scan results and lab data.    Orders Placed This Encounter   ??? COMPLETE CBC & AUTO DIFF WBC   ??? InHouse CBC ([facility])     Standing Status:   Future     Number of Occurrences:   1     Standing Expiration Date:   07/21/2018       Suzy Bouchard, MD  07/14/2018

## 2018-07-14 NOTE — Progress Notes (Signed)
Hematology/Oncology  Progress Note    Name: Melissa Leblanc  Date: 07/14/2018  DOB: 15-Jan-1966    WUJ:WJXBJ M. Starleen Blue, DO     Melissa Leblanc is a 52 year old female who was seen for management of her Hodgkin's lymphoma.    Current therapy: active surveillance, the patient previously completed 8 cycles of systemic chemotherapy with the ABVD regimen (2014)    Subjective:     Melissa Leblanc is a 52 year old African American woman has a history of Hodgkin's lymphoma. She has previously completed a full course of systemic chemotherapy with a combination of ABVD,  In 2014.  Today she reports having occasional night sweats but denies having fevers and lymphadenopathy. She states her appetite is excellent. She does not have any other concerns or complaints to report at this time.    Past medical history, family history, and social history: these were reviewed and remains unchanged.    Past Medical History:   Diagnosis Date   ??? Anxiety    ??? Arthritis 10/17/2015   ??? Asthma    ??? Bladder incontinence    ??? Cancer [facility])     lymph node cancer   ??? Depression 08/17/2014   ??? Diabetes (HCC)     border line diabetes   ??? GERD (gastroesophageal reflux disease)    ??? Hypertension    ??? Lymphoma ([place])    ??? Neck mass    ??? Prediabetes    ??? VAIN I (vaginal intraepithelial neoplasia grade I)      Past Surgical History:   Procedure Laterality Date   ??? HX GYN      BTL   ??? HX HYSTERECTOMY  2004   ??? HX OTHER SURGICAL      lymph node biopsies neck   ??? HX VASCULAR ACCESS      double right chest mediport   ??? PR RMVL TUN CTR VAD W/SUBQ PORT/PMP CTR/PRPH INSJ N/A 03/14/2016    Dr. Jimmye Norman     Social History     Socioeconomic History   ??? Marital status: SINGLE     Spouse name: Not on file   ??? Number of children: Not on file   ??? Years of education: Not on file   ??? Highest education level: Not on file   Occupational History   ??? Not on file   Social Needs   ??? Financial resource strain: Not on file   ??? Food insecurity:     Worry: Not on file     Inability: Not on file   ???  Transportation needs:     Medical: Not on file     Non-medical: Not on file   Tobacco Use   ??? Smoking status: Current Every Day Smoker     Packs/day: 0.50     Years: 28.00     Pack years: 14.00   ??? Smokeless tobacco: Never Used   Substance and Sexual Activity   ??? Alcohol use: Yes     Alcohol/week: 3.0 - 4.0 standard drinks     Types: 3 - 4 Glasses of wine per week     Comment: every 3 to 4 days   ??? Drug use: Yes     Types: Marijuana     Comment: one or 2 puffs occasionally (none now as of 03/11/16 per patient)   ??? Sexual activity: Never   Lifestyle   ??? Physical activity:     Days per week: Not on file     Minutes per session:  Not on file   ??? Stress: Not on file   Relationships   ??? Social connections:     Talks on phone: Not on file     Gets together: Not on file     Attends religious service: Not on file     Active member of club or organization: Not on file     Attends meetings of clubs or organizations: Not on file     Relationship status: Not on file   ??? Intimate partner violence:     Fear of current or ex partner: Not on file     Emotionally abused: Not on file     Physically abused: Not on file     Forced sexual activity: Not on file   Other Topics Concern   ??? Not on file   Social History Narrative   ??? Not on file     Family History   Problem Relation Age of Onset   ??? Cancer Mother         throat   ??? Breast Cancer Mother    ??? Colon Cancer Mother    ??? Hypertension Mother    ??? Colon Cancer Father    ??? Pacemaker Father    ??? Cancer Sister         thigh that moved to lung, sounds like myosarcoma?   ??? Hypertension Sister    ??? Alcohol abuse Neg Hx    ??? Arthritis-rheumatoid Neg Hx    ??? Asthma Neg Hx    ??? Bleeding Prob Neg Hx    ??? Diabetes Neg Hx    ??? Elevated Lipids Neg Hx    ??? Headache Neg Hx    ??? Lung Disease Neg Hx    ??? Migraines Neg Hx    ??? Psychiatric Disorder Neg Hx    ??? Stroke Neg Hx    ??? Mental Retardation Neg Hx      Current Outpatient Medications   Medication Sig Dispense Refill   ??? Omeprazole delayed release  (PRILOSEC D/R) 20 mg tablet Take 1 Tab by mouth daily. 30 Tab 1   ??? lisinopril-hydroCHLOROthiazide (PRINZIDE, ZESTORETIC) 20-25 mg per tablet take 1 tablet by mouth once daily 90 Tab 3   ??? potassium chloride (KLOR-CON) 10 mEq tablet Take 1 Tab by mouth daily. 90 Tab 3   ??? furosemide (LASIX) 20 mg tablet Take 1 Tab by mouth daily as needed. 90 Tab 3   ??? cyclobenzaprine (FLEXERIL) 5 mg tablet Take 1 Tab by mouth three (3) times daily as needed for Muscle Spasm(s). 90 Tab 3       Review of Systems  Constitutional: The patient has no acute distress or discomfort.  HEENT: The patient denies recent head trauma, eye pain, blurred vision,  hearing deficit, oropharyngeal mucosal pain or lesions, and the patient denies throat pain or discomfort.  Lymphatics: The patient denies palpable peripheral lymphadenopathy.  Hematologic: The patient denies having bruising, bleeding, or progressive fatigue.  Respiratory: the patient is complaining of some cough, low-grade fever and running nose.  Cardiovascular: The patient denies having leg pain, leg swelling, heart palpitations, chest permit, chest pain, or lightheadedness.  The patient denies having dyspnea on exertion.  Gastrointestinal: The patient denies having nausea, emesis, or diarrhea. The patient denies having any hematemesis or blood in the stool.  Genitourinary: Patient denies having urinary urgency, frequency, or dysuria.  The patient denies having blood in the urine.  Psychological: The patient denies having symptoms of nervousness, anxiety,  depression, or thoughts of harming herself  Or anybody .  Skin: Patient denies having skin rashes, skin, ulcerations, or unexplained itching or pruritus.  Musculoskeletal: The patient denies having pain in the joints or bones.      Objective:     Visit Vitals  BP 124/77   Pulse 89   Temp 98.9 ??F (37.2 ??C) (Oral)   Resp 16   Ht 5\' 5"  (1.651 m)   Wt 92.5 kg (204 lb)   LMP  (LMP Unknown)   SpO2 99%   BMI 33.95 kg/m??     ECOG PS=0, pain  score=0/10    Physical Exam:   Gen. Appearance: The patient is in no acute distress.  Skin: There is no bruise or rash.  HEENT: The exam is unremarkable, except for mild facial puffiness.  Neck: Supple without discrete lymphadenopathy but some fullness in his supraclavicular area is present.  Lungs: Clear to auscultation and percussion; there are no wheezes or rhonchi.  Heart: Regular rate and rhythm; there are no murmurs, gallops, or rubs.  Abdomen: Bowel sounds are present and normal.  There is no guarding, tenderness, or hepatosplenomegaly.  Extremities: There is no clubbing, cyanosis, or edema.  Neurologic: There are no focal neurologic deficits.  Lymphatics: There is no palpable peripheral lymphadenopathy. Musculoskeletal: The patient has full range of motion at all joints.  There is no evidence of joint deformity or effusions.  There is no focal joint tenderness.  Psychological/psychiatric: There is no clinical evidence of anxiety, depression, or melancholy.    Lab data:      Results for orders placed or performed during the hospital encounter of 07/14/18   CBC WITH 3 PART DIFF     Status: Abnormal   Result Value Ref Range Status    WBC 5.8 4.5 - 13.0 K/uL Final    RBC 4.93 4.10 - 5.10 M/uL Final    HGB 12.6 12.0 - 16.0 g/dL Final    HCT 40.0 36 - 48 % Final    MCV 81.1 78 - 102 FL Final    MCH 25.6 25.0 - 35.0 PG Final    MCHC 31.5 31 - 37 g/dL Final    RDW 16.7 (H) 11.5 - 14.5 % Final    PLATELET 266 140 - 440 K/uL Final    NEUTROPHILS 51 40 - 70 % Final    MIXED CELLS 7 0.1 - 17 % Final    LYMPHOCYTES 42 14 - 44 % Final    ABS. NEUTROPHILS 2.9 1.8 - 9.5 K/UL Final    ABS. MIXED CELLS 0.4 0.0 - 2.3 K/uL Final    ABS. LYMPHOCYTES 2.5 1.1 - 5.9 K/UL Final     Comment: Test performed at [place] or Outpatient Infusion Center Location. Reviewed by Medical Director.    DF AUTOMATED   Final           Assessment:     1. Chronic leukopenia    2. Hodgkin's disease of lymph  nodes of head, face, and neck (HCC)      Plan:     Leukopenia: Today her CBC shows her WBC is normal at 5.8.  Her absolute neutrophil count is 2.9 and absolute lymphocyte count is 2.5.  I have explained to the patient that her leukopenia has remained in the normal range.    Hodgkin's lymphoma: On 07/29/17 her Sed rate was normal at 7. On 01/22/2017 CT chest abdomen and pelvis showed there  is no evidence of any new lymphadenopathy or infiltrating lymphadenopathy in  chest, abdomen or in pelvis.  The most recent sedimentation rate was 75 mm an hour.  She is now having some complaints of night sweats.  Therefore I am going to schedule the patient for CT scans of the chest, abdomen, and pelvis.  I will see her back in clinic in 3 weeks. Sedimentation rate will be obtained at this time.     Iron deficiency anemia: She takes over-the-counter iron supplement once daily and this will be continued. Her current hemoglobin is 12.6 g/dL with hematocrit of 40 %. Iron Profile and Ferritin will be obtained at this time.    Chronic arthritis : I have recommended that the patient continue to use Aleve 1 tablet twice daily. She has previously been provided Percocet in the past for severe pain. Today she did not request for any Rx.    The patient will follow-up in the clinic in 3 weeks to review the CT scan results and lab data.    Orders Placed This Encounter   ??? COMPLETE CBC & AUTO DIFF WBC   ??? InHouse CBC ([facility])     Standing Status:   Future     Number of Occurrences:   1     Standing Expiration Date:   07/21/2018       Suzy Bouchard, MD  07/14/2018

## 2018-07-15 LAB — COMPREHENSIVE METABOLIC PANEL
ALT: 18 U/L (ref 13–56)
AST: 15 U/L (ref 10–38)
Albumin/Globulin Ratio: 1.1 (ref 0.8–1.7)
Albumin: 4 g/dL (ref 3.4–5.0)
Alkaline Phosphatase: 74 U/L (ref 45–117)
Anion Gap: 5 mmol/L (ref 3.0–18)
BUN: 10 MG/DL (ref 7.0–18)
Bun/Cre Ratio: 11 — ABNORMAL LOW (ref 12–20)
CO2: 26 mmol/L (ref 21–32)
Calcium: 9.6 MG/DL (ref 8.5–10.1)
Chloride: 110 mmol/L (ref 100–111)
Creatinine: 0.95 MG/DL (ref 0.6–1.3)
EGFR IF NonAfrican American: >60 mL/min/{1.73_m2} (ref >60)
GFR African American: >60 mL/min/{1.73_m2} (ref >60)
Globulin: 3.8 g/dL (ref 2.0–4.0)
Glucose: 90 mg/dL (ref 74–99)
Potassium: 3.9 mmol/L (ref 3.5–5.5)
Sodium: 141 mmol/L (ref 136–145)
Total Bilirubin: 0.3 MG/DL (ref 0.2–1.0)
Total Protein: 7.8 g/dL (ref 6.4–8.2)

## 2018-07-15 LAB — IRON AND TIBC
Iron Saturation: 17 %
Iron: 50 ug/dL (ref 50–175)
TIBC: 298 ug/dL (ref 250–450)

## 2018-07-15 LAB — SEDIMENTATION RATE: Sed Rate: 22 mm/hr (ref 0–30)

## 2018-07-15 LAB — FERRITIN
Ferritin: 97 NG/ML (ref 8–388)
Ferritin: 97 NG/ML (ref 8–388)

## 2018-07-15 LAB — IRON PROFILE
Iron % saturation: 17 %
Iron: 50 ug/dL (ref 50–175)
TIBC: 298 ug/dL (ref 250–450)

## 2018-07-15 LAB — METABOLIC PANEL, COMPREHENSIVE
A-G Ratio: 1.1 (ref 0.8–1.7)
ALT (SGPT): 18 U/L (ref 13–56)
AST (SGOT): 15 U/L (ref 10–38)
Albumin: 4 g/dL (ref 3.4–5.0)
Alk. phosphatase: 74 U/L (ref 45–117)
Anion gap: 5 mmol/L (ref 3.0–18)
BUN/Creatinine ratio: 11 — ABNORMAL LOW (ref 12–20)
BUN: 10 MG/DL (ref 7.0–18)
Bilirubin, total: 0.3 MG/DL (ref 0.2–1.0)
CO2: 26 mmol/L (ref 21–32)
Calcium: 9.6 MG/DL (ref 8.5–10.1)
Chloride: 110 mmol/L (ref 100–111)
Creatinine: 0.95 MG/DL (ref 0.6–1.3)
GFR est AA: >60 mL/min/{1.73_m2} (ref >60)
GFR est non-AA: >60 mL/min/{1.73_m2} (ref >60)
Globulin: 3.8 g/dL (ref 2.0–4.0)
Glucose: 90 mg/dL (ref 74–99)
Potassium: 3.9 mmol/L (ref 3.5–5.5)
Protein, total: 7.8 g/dL (ref 6.4–8.2)
Sodium: 141 mmol/L (ref 136–145)

## 2018-07-15 LAB — SED RATE (ESR): Sed rate, automated: 22 mm/hr (ref 0–30)

## 2018-07-25 ENCOUNTER — Inpatient Hospital Stay: Admit: 2018-07-25 | Payer: MEDICARE | Attending: Hematology & Oncology | Primary: Family

## 2018-07-25 DIAGNOSIS — C8191 Hodgkin lymphoma, unspecified, lymph nodes of head, face, and neck: Secondary | ICD-10-CM

## 2018-07-25 MED ORDER — IOPAMIDOL 61 % IV SOLN
300 mg iodine /mL (61 %) | Freq: Once | INTRAVENOUS | Status: AC
Start: 2018-07-25 — End: 2018-07-25
  Administered 2018-07-25: 14:00:00 via INTRAVENOUS

## 2018-07-25 MED FILL — ISOVUE-300  61 % INTRAVENOUS SOLUTION: 300 mg iodine /mL (61 %) | INTRAVENOUS | Qty: 100

## 2018-07-25 NOTE — Progress Notes (Signed)
Patient has a pending appointment with Dr. Nadyne Coombes on 12/17 to review imaging results.

## 2018-08-03 NOTE — Progress Notes (Signed)
Complex Case Management      Date/Time:  08/03/2018 2:19 PM    Method of communication with patient:phone    Nurse Navigator (NN) contacted the patient by telephone to perform Ambulatory Care Coordination. Verified name and DOB with patient as identifiers. Provided introduction to self, and explanation of the Ambulatory Care Manager's role.     Reviewed most recent clinic visit w/ patient who verbalized understanding. Patient given an opportunity to ask questions.    Top Challenges reviewed with the patient   1. Upcoming scheduled appts. Pt stated she had dates of appts mixed.  2. Hx of DM2, HTN, Non-Hodkins Lymphoma (in remission)  3. Pt  States doing well, no issues currently     The patient agrees to contact the PCP office or the [facility] for questions related to their healthcare. Provided contact information for future reference.    Disease Specific:   N/A    Home Health Active: No    DME Active: No    Barriers to care? none    Advance Care Planning:   Does patient have an Advance Directive:  not on file     Medication(s):   Medication reconciliation was not performed with patient, who declined, but verbalizes understanding of administration of home medications.  There were no barriers to obtaining medications identified at this time.    Referral to Pharm D needed: no     Current Outpatient Medications   Medication Sig   ??? Omeprazole delayed release (PRILOSEC D/R) 20 mg tablet Take 1 Tab by mouth daily.   ??? lisinopril-hydroCHLOROthiazide (PRINZIDE, ZESTORETIC) 20-25 mg per tablet take 1 tablet by mouth once daily   ??? potassium chloride (KLOR-CON) 10 mEq tablet Take 1 Tab by mouth daily.   ??? furosemide (LASIX) 20 mg tablet Take 1 Tab by mouth daily as needed.   ??? cyclobenzaprine (FLEXERIL) 5 mg tablet Take 1 Tab by mouth three (3) times daily as needed for Muscle Spasm(s).     No current facility-administered medications for this visit.        [facility] follow up appointment(s):   Future Appointments    Date Time Provider [place]   08/06/2018  3:15 PM Candie Echevaria, NP [facility] SCHED        Non-[facility] follow up appointment(s):     Goals     ??? Attends follow up appointments on schedule      08/03/18 Patient will attend all scheduled appointments through 10/04/18      ??? Knowledge and adherence of prescribed medication (ie. action, side effects, missed dose, etc.).      08/03/18 Pt will take all medications prescribed to be evaluated on each outreach through  10/04/18

## 2018-08-03 NOTE — Telephone Encounter (Signed)
Patient forgot she had an appointment tomorrow and wanted to know if we would just give her the PET scan results over the phone. She can be reached at [phone]. If she needs to reschedule, please ask her to call the [facility] office back.

## 2018-08-03 NOTE — Progress Notes (Signed)
 Complex Case Management      Date/Time:  08/03/2018 2:19 PM    Method of communication with patient:phone    Nurse Navigator (NN) contacted the patient by telephone to perform Ambulatory Care Coordination. Verified name and DOB with patient as identifiers. Provided introduction to self, and explanation of the Ambulatory Care Manager's role.     Reviewed most recent clinic visit w/ patient who verbalized understanding. Patient given an opportunity to ask questions.    Top Challenges reviewed with the patient   1. Upcoming scheduled appts. Pt stated she had dates of appts mixed.  2. Hx of DM2, HTN, Non-Hodkins Lymphoma (in remission)  3. Pt  States doing well, no issues currently     The patient agrees to contact the PCP office or the Ambulatory Care Manager for questions related to their healthcare. Provided contact information for future reference.    Disease Specific:   N/A    Home Health Active: No    DME Active: No    Barriers to care? none    Advance Care Planning:   Does patient have an Advance Directive:  not on file     Medication(s):   Medication reconciliation was not performed with patient, who declined, but verbalizes understanding of administration of home medications.  There were no barriers to obtaining medications identified at this time.    Referral to Pharm D needed: no     Current Outpatient Medications   Medication Sig   . Omeprazole delayed release (PRILOSEC D/R) 20 mg tablet Take 1 Tab by mouth daily.   SABRA lisinopril-hydroCHLOROthiazide (PRINZIDE, ZESTORETIC) 20-25 mg per tablet take 1 tablet by mouth once daily   . potassium chloride (KLOR-CON) 10 mEq tablet Take 1 Tab by mouth daily.   . furosemide (LASIX) 20 mg tablet Take 1 Tab by mouth daily as needed.   . cyclobenzaprine (FLEXERIL) 5 mg tablet Take 1 Tab by mouth three (3) times daily as needed for Muscle Spasm(s).     No current facility-administered medications for this visit.        [facility] follow up appointment(s):   Future Appointments    Date Time Provider Department Center   08/06/2018  3:15 PM Napolean Darice LABOR, NP [facility] SCHED        Non-[facility] follow up appointment(s):     Goals     . Attends follow up appointments on schedule      08/03/18 Patient will attend all scheduled appointments through 10/04/18      . Knowledge and adherence of prescribed medication (ie. action, side effects, missed dose, etc.).      08/03/18 Pt will take all medications prescribed to be evaluated on each outreach through  10/04/18

## 2018-08-04 ENCOUNTER — Encounter: Attending: Hematology & Oncology | Primary: Family

## 2018-08-04 NOTE — Telephone Encounter (Signed)
Melissa Leblanc- we need to call patient, thanks

## 2018-08-06 ENCOUNTER — Ambulatory Visit: Attending: Family | Primary: Family

## 2018-08-06 ENCOUNTER — Ambulatory Visit: Admit: 2018-08-06 | Discharge: 2018-08-06 | Payer: MEDICARE | Attending: Family | Primary: Family

## 2018-08-06 DIAGNOSIS — Z1239 Encounter for other screening for malignant neoplasm of breast: Secondary | ICD-10-CM

## 2018-08-06 MED ORDER — FUROSEMIDE 20 MG TAB
20 mg | ORAL_TABLET | Freq: Every day | ORAL | 1 refills | Status: DC | PRN
Start: 2018-08-06 — End: 2019-03-30

## 2018-08-06 MED ORDER — LISINOPRIL-HYDROCHLOROTHIAZIDE 20 MG-25 MG TAB
20-25 mg | ORAL_TABLET | ORAL | 1 refills | Status: DC
Start: 2018-08-06 — End: 2019-01-05

## 2018-08-06 MED ORDER — POTASSIUM CHLORIDE SR 10 MEQ TAB, PARTICLES/CRYSTALS
10 mEq | ORAL_TABLET | Freq: Every day | ORAL | 1 refills | Status: DC
Start: 2018-08-06 — End: 2019-01-05

## 2018-08-06 MED ORDER — OMEPRAZOLE 20 MG TAB, DELAYED RELEASE
20 mg | ORAL_TABLET | Freq: Every day | ORAL | 1 refills | Status: DC
Start: 2018-08-06 — End: 2019-03-30

## 2018-08-06 MED ORDER — CYCLOBENZAPRINE 5 MG TAB
5 mg | ORAL_TABLET | Freq: Three times a day (TID) | ORAL | 1 refills | Status: DC | PRN
Start: 2018-08-06 — End: 2019-03-30

## 2018-08-06 NOTE — Progress Notes (Signed)
Melissa Leblanc is a 53 y.o. female presenting today for Follow Up Chronic Condition (HTN GERD  pt states " that prilosec is not covered through her insurance and is requesting a new medication ) and Medication Refill  .       Follow Up Chronic Condition   Pertinent negatives include no chest pain, no abdominal pain and no headaches.   Medication Refill   Pertinent negatives include no chest pain, no abdominal pain and no headaches.   :  Melissa Leblanc presents to the office today for hypertension follow-up care.  Patient presents today without any complaints of pain. She denies any chest pain, palpitation or lower extremity edema.  She has a previous history of Hodgkin's disease, arthritis and GERD.  She is current taking Omeprazole for GERD and her symptoms have improved  She is negative for any cough, wheezing or dyspnea.  Patient also has a history of prediabetes her last hemoglobin A1c in April, 2019 was 5.9.  She denies any polyuria, polydipsia polyphagia.  Her blood pressure is controlled at 138/86 and she is compliant with taking her medication daily.    Review of Systems   Respiratory: Negative for cough.    Cardiovascular: Negative for chest pain and palpitations.   Gastrointestinal: Negative for abdominal pain, nausea and vomiting.   Neurological: Negative for dizziness and headaches.       Allergies   Allergen Reactions   ??? Vicodin [Hydrocodone-Acetaminophen] Itching       Current Outpatient Medications   Medication Sig Dispense Refill   ??? lisinopril-hydroCHLOROthiazide (PRINZIDE, ZESTORETIC) 20-25 mg per tablet take 1 tablet by mouth once daily  Indications: high blood pressure 90 Tab 1   ??? furosemide (LASIX) 20 mg tablet Take 1 Tab by mouth daily as needed for Other (fluid retention). Indications: visible water retention 30 Tab 1   ??? cyclobenzaprine (FLEXERIL) 5 mg tablet Take 1 Tab by mouth three (3) times daily as needed for Muscle Spasm(s). 30 Tab 1    ??? Omeprazole delayed release (PRILOSEC D/R) 20 mg tablet Take 1 Tab by mouth daily. 30 Tab 1   ??? potassium chloride (KLOR-CON) 10 mEq tablet Take 1 Tab by mouth daily. Indications: prevention of low potassium in the blood 90 Tab 1       Past Medical History:   Diagnosis Date   ??? Anxiety    ??? Arthritis 10/17/2015   ??? Asthma    ??? Bladder incontinence    ??? Cancer [facility])     lymph node cancer   ??? Depression 08/17/2014   ??? Diabetes (HCC)     border line diabetes   ??? GERD (gastroesophageal reflux disease)    ??? Hypertension    ??? Lymphoma ([place])    ??? Neck mass    ??? Prediabetes    ??? VAIN I (vaginal intraepithelial neoplasia grade I)        Past Surgical History:   Procedure Laterality Date   ??? HX GYN      BTL   ??? HX HYSTERECTOMY  2004   ??? HX OTHER SURGICAL      lymph node biopsies neck   ??? HX VASCULAR ACCESS      double right chest mediport   ??? PR RMVL TUN CTR VAD W/SUBQ PORT/PMP CTR/PRPH INSJ N/A 03/14/2016    Dr. Jimmye Norman       Social History     Socioeconomic History   ??? Marital status: SINGLE     Spouse name: Not  on file   ??? Number of children: Not on file   ??? Years of education: Not on file   ??? Highest education level: Not on file   Occupational History   ??? Not on file   Social Needs   ??? Financial resource strain: Not on file   ??? Food insecurity:     Worry: Not on file     Inability: Not on file   ??? Transportation needs:     Medical: Not on file     Non-medical: Not on file   Tobacco Use   ??? Smoking status: Current Every Day Smoker     Packs/day: 0.50     Years: 28.00     Pack years: 14.00   ??? Smokeless tobacco: Never Used   Substance and Sexual Activity   ??? Alcohol use: Yes     Alcohol/week: 3.0 - 4.0 standard drinks     Types: 3 - 4 Glasses of wine per week     Comment: every 3 to 4 days   ??? Drug use: Yes     Types: Marijuana     Comment: one or 2 puffs occasionally (none now as of 03/11/16 per patient)   ??? Sexual activity: Never   Lifestyle   ??? Physical activity:     Days per week: Not on file      Minutes per session: Not on file   ??? Stress: Not on file   Relationships   ??? Social connections:     Talks on phone: Not on file     Gets together: Not on file     Attends religious service: Not on file     Active member of club or organization: Not on file     Attends meetings of clubs or organizations: Not on file     Relationship status: Not on file   ??? Intimate partner violence:     Fear of current or ex partner: Not on file     Emotionally abused: Not on file     Physically abused: Not on file     Forced sexual activity: Not on file   Other Topics Concern   ??? Not on file   Social History Narrative   ??? Not on file       Patient does not have an advanced directive on file    Vitals:    08/06/18 1455   BP: 138/86   Pulse: 69   Resp: 20   Temp: 98.6 ??F (37 ??C)   TempSrc: Oral   SpO2: 94%   Weight: 204 lb (92.5 kg)   Height: '5\' 5"'$  (1.651 m)   PainSc:   0 - No pain       Physical Exam   Constitutional: No distress.   Cardiovascular: Normal rate, regular rhythm and normal heart sounds.   Pulmonary/Chest: Effort normal and breath sounds normal.   Neurological: She is alert.   Nursing note and vitals reviewed.      Hospital Outpatient Visit on 07/14/2018   Component Date Value Ref Range Status   ??? Sodium 07/14/2018 141  136 - 145 mmol/L Final   ??? Potassium 07/14/2018 3.9  3.5 - 5.5 mmol/L Final   ??? Chloride 07/14/2018 110  100 - 111 mmol/L Final   ??? CO2 07/14/2018 26  21 - 32 mmol/L Final   ??? Anion gap 07/14/2018 5  3.0 - 18 mmol/L Final   ??? Glucose 07/14/2018 90  74 - 99 mg/dL  Final   ??? BUN 07/14/2018 10  7.0 - 18 MG/DL Final   ??? Creatinine 07/14/2018 0.95  0.6 - 1.3 MG/DL Final   ??? BUN/Creatinine ratio 07/14/2018 11* 12 - 20   Final   ??? GFR est AA 07/14/2018 >60  >60 ml/min/1.59m Final   ??? GFR est non-AA 07/14/2018 >60  >60 ml/min/1.763mFinal    Comment: (NOTE)  Estimated GFR is calculated using the Modification of Diet in Renal   Disease (MDRD) Study equation, reported for both African Americans    (GFRAA) and non-African Americans (GFRNA), and normalized to 1.738m body surface area. The physician must decide which value applies to   the patient. The MDRD study equation should only be used in   individuals age 29 44 older. It has not been validated for the   following: pregnant women, patients with serious comorbid conditions,   or on certain medications, or persons with extremes of body size,   muscle mass, or nutritional status.     ??? Calcium 07/14/2018 9.6  8.5 - 10.1 MG/DL Final   ??? Bilirubin, total 07/14/2018 0.3  0.2 - 1.0 MG/DL Final   ??? ALT (SGPT) 07/14/2018 18  13 - 56 U/L Final   ??? AST (SGOT) 07/14/2018 15  10 - 38 U/L Final   ??? Alk. phosphatase 07/14/2018 74  45 - 117 U/L Final   ??? Protein, total 07/14/2018 7.8  6.4 - 8.2 g/dL Final   ??? Albumin 07/14/2018 4.0  3.4 - 5.0 g/dL Final   ??? Globulin 07/14/2018 3.8  2.0 - 4.0 g/dL Final   ??? A-G Ratio 07/14/2018 1.1  0.8 - 1.7   Final   ??? Iron 07/14/2018 50  50 - 175 ug/dL Final    Patients receiving metal-binding drugs (e.g. deferoxamine) may show spuriously depressed iron values, as chelated iron may not properly react in the iron assay.   ??? TIBC 07/14/2018 298  250 - 450 ug/dL Final   ??? Iron % saturation 07/14/2018 17  % Final   ??? Ferritin 07/14/2018 97  8 - 388 NG/ML Final   ??? Sed rate, automated 07/14/2018 22  0 - 30 mm/hr Final   Hospital Outpatient Visit on 07/14/2018   Component Date Value Ref Range Status   ??? WBC 07/14/2018 5.8  4.5 - 13.0 K/uL Final   ??? RBC 07/14/2018 4.93  4.10 - 5.10 M/uL Final   ??? HGB 07/14/2018 12.6  12.0 - 16.0 g/dL Final   ??? HCT 07/14/2018 40.0  36 - 48 % Final   ??? MCV 07/14/2018 81.1  78 - 102 FL Final   ??? MCH 07/14/2018 25.6  25.0 - 35.0 PG Final   ??? MCHC 07/14/2018 31.5  31 - 37 g/dL Final   ??? RDW 07/14/2018 16.7* 11.5 - 14.5 % Final   ??? PLATELET 07/14/2018 266  140 - 440 K/uL Final   ??? NEUTROPHILS 07/14/2018 51  40 - 70 % Final   ??? MIXED CELLS 07/14/2018 7  0.1 - 17 % Final    ??? LYMPHOCYTES 07/14/2018 42  14 - 44 % Final   ??? ABS. NEUTROPHILS 07/14/2018 2.9  1.8 - 9.5 K/UL Final   ??? ABS. MIXED CELLS 07/14/2018 0.4  0.0 - 2.3 K/uL Final   ??? ABS. LYMPHOCYTES 07/14/2018 2.5  1.1 - 5.9 K/UL Final    Test performed at [facility]. Reviewed by Medical Director.   ???  DF 07/14/2018 AUTOMATED    Final       .  No results found for any visits on 08/06/18.    Assessment / Plan:      ICD-10-CM ICD-9-CM    1. Breast cancer screening Z12.39 V76.10 [facility] MAMMO BI SCREENING INCL CAD   2. Essential hypertension I10 401.9 lisinopril-hydroCHLOROthiazide (PRINZIDE, ZESTORETIC) 20-25 mg per tablet      potassium chloride (KLOR-CON) 10 mEq tablet      CBC WITH AUTOMATED DIFF      METABOLIC PANEL, COMPREHENSIVE      LIPID PANEL   3. Pedal edema R60.0 782.3 furosemide (LASIX) 20 mg tablet   4. Muscle spasm M62.838 728.85 cyclobenzaprine (FLEXERIL) 5 mg tablet   5. Gastroesophageal reflux disease, esophagitis presence not specified K21.9 530.81 Omeprazole delayed release (PRILOSEC D/R) 20 mg tablet   6. Screening for cervical cancer Z12.4 V76.2 REFERRAL TO GYNECOLOGY   7. Encounter for screening mammogram for malignant neoplasm of breast  Z12.31 V76.12 M[facility] MAMMO BI SCREENING INCL CAD   8. Encounter for vitamin deficiency screening Z13.21 V77.99      HTN- controlled. No change to current treatment plan  Prediabetes- last Hgb A1C was 5.9  CBC, CMP, TSH  F/u in 4 months    Follow-up and Dispositions    ?? Return in about 5 months (around 01/05/2019).         I asked the patient if she  had any questions and answered her  questions.  The patient stated that she understands the treatment plan and agrees with the treatment plan    This document was created with a voice activated dictation system and may contain transcription errors.

## 2018-08-06 NOTE — Progress Notes (Signed)
Chief Complaint   Patient presents with   ??? Follow Up Chronic Condition     HTN GERD  pt states " that prilosec is not covered through her insurance and is requesting a new medication    ??? Medication Refill

## 2018-08-06 NOTE — Progress Notes (Signed)
Melissa Leblanc is a 52 y.o. female presenting today for Follow Up Chronic Condition (HTN GERD  pt states " that prilosec is not covered through her insurance and is requesting a new medication ) and Medication Refill  .       Follow Up Chronic Condition   Pertinent negatives include no chest pain, no abdominal pain and no headaches.   Medication Refill   Pertinent negatives include no chest pain, no abdominal pain and no headaches.   :  Melissa Leblanc presents to the office today for hypertension follow-up care.  Patient presents today without any complaints of pain. She denies any chest pain, palpitation or lower extremity edema.  She has a previous history of Hodgkin's disease, arthritis and GERD.  She is current taking Omeprazole for GERD and her symptoms have improved  She is negative for any cough, wheezing or dyspnea.  Patient also has a history of prediabetes her last hemoglobin A1c in April, 2019 was 5.9.  She denies any polyuria, polydipsia polyphagia.  Her blood pressure is controlled at 138/86 and she is compliant with taking her medication daily.    Review of Systems   Respiratory: Negative for cough.    Cardiovascular: Negative for chest pain and palpitations.   Gastrointestinal: Negative for abdominal pain, nausea and vomiting.   Neurological: Negative for dizziness and headaches.       Allergies   Allergen Reactions   ??? Vicodin [Hydrocodone-Acetaminophen] Itching       Current Outpatient Medications   Medication Sig Dispense Refill   ??? lisinopril-hydroCHLOROthiazide (PRINZIDE, ZESTORETIC) 20-25 mg per tablet take 1 tablet by mouth once daily  Indications: high blood pressure 90 Tab 1   ??? furosemide (LASIX) 20 mg tablet Take 1 Tab by mouth daily as needed for Other (fluid retention). Indications: visible water retention 30 Tab 1   ??? cyclobenzaprine (FLEXERIL) 5 mg tablet Take 1 Tab by mouth three (3) times daily as needed for Muscle Spasm(s). 30 Tab 1   ??? Omeprazole delayed release (PRILOSEC D/R) 20 mg  tablet Take 1 Tab by mouth daily. 30 Tab 1   ??? potassium chloride (KLOR-CON) 10 mEq tablet Take 1 Tab by mouth daily. Indications: prevention of low potassium in the blood 90 Tab 1       Past Medical History:   Diagnosis Date   ??? Anxiety    ??? Arthritis 10/17/2015   ??? Asthma    ??? Bladder incontinence    ??? Cancer [facility])     lymph node cancer   ??? Depression 08/17/2014   ??? Diabetes (HCC)     border line diabetes   ??? GERD (gastroesophageal reflux disease)    ??? Hypertension    ??? Lymphoma ([place])    ??? Neck mass    ??? Prediabetes    ??? VAIN I (vaginal intraepithelial neoplasia grade I)        Past Surgical History:   Procedure Laterality Date   ??? HX GYN      BTL   ??? HX HYSTERECTOMY  2004   ??? HX OTHER SURGICAL      lymph node biopsies neck   ??? HX VASCULAR ACCESS      double right chest mediport   ??? PR RMVL TUN CTR VAD W/SUBQ PORT/PMP CTR/PRPH INSJ N/A 03/14/2016    Dr. Jimmye Norman       Social History     Socioeconomic History   ??? Marital status: SINGLE     Spouse name: Not  on file   ??? Number of children: Not on file   ??? Years of education: Not on file   ??? Highest education level: Not on file   Occupational History   ??? Not on file   Social Needs   ??? Financial resource strain: Not on file   ??? Food insecurity:     Worry: Not on file     Inability: Not on file   ??? Transportation needs:     Medical: Not on file     Non-medical: Not on file   Tobacco Use   ??? Smoking status: Current Every Day Smoker     Packs/day: 0.50     Years: 28.00     Pack years: 14.00   ??? Smokeless tobacco: Never Used   Substance and Sexual Activity   ??? Alcohol use: Yes     Alcohol/week: 3.0 - 4.0 standard drinks     Types: 3 - 4 Glasses of wine per week     Comment: every 3 to 4 days   ??? Drug use: Yes     Types: Marijuana     Comment: one or 2 puffs occasionally (none now as of 03/11/16 per patient)   ??? Sexual activity: Never   Lifestyle   ??? Physical activity:     Days per week: Not on file     Minutes per session: Not on file   ??? Stress: Not on file   Relationships    ??? Social connections:     Talks on phone: Not on file     Gets together: Not on file     Attends religious service: Not on file     Active member of club or organization: Not on file     Attends meetings of clubs or organizations: Not on file     Relationship status: Not on file   ??? Intimate partner violence:     Fear of current or ex partner: Not on file     Emotionally abused: Not on file     Physically abused: Not on file     Forced sexual activity: Not on file   Other Topics Concern   ??? Not on file   Social History Narrative   ??? Not on file       Patient does not have an advanced directive on file    Vitals:    08/06/18 1455   BP: 138/86   Pulse: 69   Resp: 20   Temp: 98.6 ??F (37 ??C)   TempSrc: Oral   SpO2: 94%   Weight: 204 lb (92.5 kg)   Height: 5' 5" (1.651 m)   PainSc:   0 - No pain       Physical Exam   Constitutional: No distress.   Cardiovascular: Normal rate, regular rhythm and normal heart sounds.   Pulmonary/Chest: Effort normal and breath sounds normal.   Neurological: She is alert.   Nursing note and vitals reviewed.      Hospital Outpatient Visit on 07/14/2018   Component Date Value Ref Range Status   ??? Sodium 07/14/2018 141  136 - 145 mmol/L Final   ??? Potassium 07/14/2018 3.9  3.5 - 5.5 mmol/L Final   ??? Chloride 07/14/2018 110  100 - 111 mmol/L Final   ??? CO2 07/14/2018 26  21 - 32 mmol/L Final   ??? Anion gap 07/14/2018 5  3.0 - 18 mmol/L Final   ??? Glucose 07/14/2018 90  74 - 99 mg/dL  Final   ??? BUN 07/14/2018 10  7.0 - 18 MG/DL Final   ??? Creatinine 07/14/2018 0.95  0.6 - 1.3 MG/DL Final   ??? BUN/Creatinine ratio 07/14/2018 11* 12 - 20   Final   ??? GFR est AA 07/14/2018 >60  >60 ml/min/1.55m Final   ??? GFR est non-AA 07/14/2018 >60  >60 ml/min/1.769mFinal    Comment: (NOTE)  Estimated GFR is calculated using the Modification of Diet in Renal   Disease (MDRD) Study equation, reported for both African Americans   (GFRAA) and non-African Americans (GFRNA), and normalized to 1.7366m body surface area.  The physician must decide which value applies to   the patient. The MDRD study equation should only be used in   individuals age 44 52 older. It has not been validated for the   following: pregnant women, patients with serious comorbid conditions,   or on certain medications, or persons with extremes of body size,   muscle mass, or nutritional status.     ??? Calcium 07/14/2018 9.6  8.5 - 10.1 MG/DL Final   ??? Bilirubin, total 07/14/2018 0.3  0.2 - 1.0 MG/DL Final   ??? ALT (SGPT) 07/14/2018 18  13 - 56 U/L Final   ??? AST (SGOT) 07/14/2018 15  10 - 38 U/L Final   ??? Alk. phosphatase 07/14/2018 74  45 - 117 U/L Final   ??? Protein, total 07/14/2018 7.8  6.4 - 8.2 g/dL Final   ??? Albumin 07/14/2018 4.0  3.4 - 5.0 g/dL Final   ??? Globulin 07/14/2018 3.8  2.0 - 4.0 g/dL Final   ??? A-G Ratio 07/14/2018 1.1  0.8 - 1.7   Final   ??? Iron 07/14/2018 50  50 - 175 ug/dL Final    Patients receiving metal-binding drugs (e.g. deferoxamine) may show spuriously depressed iron values, as chelated iron may not properly react in the iron assay.   ??? TIBC 07/14/2018 298  250 - 450 ug/dL Final   ??? Iron % saturation 07/14/2018 17  % Final   ??? Ferritin 07/14/2018 97  8 - 388 NG/ML Final   ??? Sed rate, automated 07/14/2018 22  0 - 30 mm/hr Final   Hospital Outpatient Visit on 07/14/2018   Component Date Value Ref Range Status   ??? WBC 07/14/2018 5.8  4.5 - 13.0 K/uL Final   ??? RBC 07/14/2018 4.93  4.10 - 5.10 M/uL Final   ??? HGB 07/14/2018 12.6  12.0 - 16.0 g/dL Final   ??? HCT 07/14/2018 40.0  36 - 48 % Final   ??? MCV 07/14/2018 81.1  78 - 102 FL Final   ??? MCH 07/14/2018 25.6  25.0 - 35.0 PG Final   ??? MCHC 07/14/2018 31.5  31 - 37 g/dL Final   ??? RDW 07/14/2018 16.7* 11.5 - 14.5 % Final   ??? PLATELET 07/14/2018 266  140 - 440 K/uL Final   ??? NEUTROPHILS 07/14/2018 51  40 - 70 % Final   ??? MIXED CELLS 07/14/2018 7  0.1 - 17 % Final   ??? LYMPHOCYTES 07/14/2018 42  14 - 44 % Final   ??? ABS. NEUTROPHILS 07/14/2018 2.9  1.8 - 9.5 K/UL Final   ??? ABS. MIXED CELLS  07/14/2018 0.4  0.0 - 2.3 K/uL Final   ??? ABS. LYMPHOCYTES 07/14/2018 2.5  1.1 - 5.9 K/UL Final    Test performed at [facility]. Reviewed by Medical Director.   ???  DF 07/14/2018 AUTOMATED    Final       .  No results found for any visits on 08/06/18.    Assessment / Plan:      ICD-10-CM ICD-9-CM    1. Breast cancer screening Z12.39 V76.10 MAM MAMMO BI SCREENING INCL CAD   2. Essential hypertension I10 401.9 lisinopril-hydroCHLOROthiazide (PRINZIDE, ZESTORETIC) 20-25 mg per tablet      potassium chloride (KLOR-CON) 10 mEq tablet      CBC WITH AUTOMATED DIFF      METABOLIC PANEL, COMPREHENSIVE      LIPID PANEL   3. Pedal edema R60.0 782.3 furosemide (LASIX) 20 mg tablet   4. Muscle spasm M62.838 728.85 cyclobenzaprine (FLEXERIL) 5 mg tablet   5. Gastroesophageal reflux disease, esophagitis presence not specified K21.9 530.81 Omeprazole delayed release (PRILOSEC D/R) 20 mg tablet   6. Screening for cervical cancer Z12.4 V76.2 REFERRAL TO GYNECOLOGY   7. Encounter for screening mammogram for malignant neoplasm of breast  Z12.31 V76.12 MAM MAMMO BI SCREENING INCL CAD   8. Encounter for vitamin deficiency screening Z13.21 V77.99      HTN- controlled. No change to current treatment plan  Prediabetes- last Hgb A1C was 5.9  CBC, CMP, TSH  F/u in 4 months    Follow-up and Dispositions    ?? Return in about 5 months (around 01/05/2019).         I asked the patient if she  had any questions and answered her  questions.  The patient stated that she understands the treatment plan and agrees with the treatment plan    This document was created with a voice activated dictation system and may contain transcription errors.

## 2018-08-06 NOTE — Progress Notes (Signed)
Chief Complaint   Patient presents with   . Follow Up Chronic Condition     HTN GERD  pt states " that prilosec is not covered through her insurance and is requesting a new medication    . Medication Refill

## 2018-08-17 NOTE — Progress Notes (Signed)
Complex Case Management      Date/Time:  08/17/2018 2:19 PM    Method of communication with patient:phone    Nurse Navigator (NN) contacted the patient by telephone to perform Ambulatory Care Coordination. Verified name and DOB with patient as identifiers. Provided introduction to self, and explanation of the Ambulatory Care Manager's role.     Reviewed most recent clinic visit w/ patient who verbalized understanding. Patient given an opportunity to ask questions.    Top Challenges reviewed with the patient   1. Pt states unable to pick up two medications from most recent PCP visit.  Called pharmacy and resolved issues.  Pt will pick up meds.  2. Hx of DM2, HTN, Non-Hodkins Lymphoma (in remission)  3. Pt  States doing well, no other issues currently     The patient agrees to contact the PCP office or the [facility] for questions related to their healthcare. Provided contact information for future reference.    Disease Specific:   N/A    Home Health Active: No    DME Active: No    Barriers to care? none    Advance Care Planning:   Does patient have an Advance Directive:  not on file     Medication(s):   Medication reconciliation was performed with patient, who verbalizes understanding of administration of home medications.  There were no barriers to obtaining medications identified at this time.    Referral to Pharm D needed: no     Current Outpatient Medications   Medication Sig   ??? lisinopril-hydroCHLOROthiazide (PRINZIDE, ZESTORETIC) 20-25 mg per tablet take 1 tablet by mouth once daily  Indications: high blood pressure   ??? furosemide (LASIX) 20 mg tablet Take 1 Tab by mouth daily as needed for Other (fluid retention). Indications: visible water retention   ??? cyclobenzaprine (FLEXERIL) 5 mg tablet Take 1 Tab by mouth three (3) times daily as needed for Muscle Spasm(s).   ??? Omeprazole delayed release (PRILOSEC D/R) 20 mg tablet Take 1 Tab by mouth daily.    ??? potassium chloride (KLOR-CON) 10 mEq tablet Take 1 Tab by mouth daily. Indications: prevention of low potassium in the blood     No current facility-administered medications for this visit.        [facility] follow up appointment(s):   Future Appointments   Date Time Provider [place]   01/05/2019  9:15 AM Candie Echevaria, NP [facility] SCHED        Non-[facility] follow up appointment(s):     Goals     ??? Attends follow up appointments on schedule      08/03/18 Patient will attend all scheduled appointments through 10/04/18      ??? Knowledge and adherence of prescribed medication (ie. action, side effects, missed dose, etc.).      08/03/18 Pt will take all medications prescribed to be evaluated on each outreach through  10/04/18

## 2018-08-17 NOTE — Progress Notes (Signed)
Complex Case Management      Date/Time:  08/17/2018 2:19 PM    Method of communication with patient:phone    Nurse Navigator (NN) contacted the patient by telephone to perform Ambulatory Care Coordination. Verified name and DOB with patient as identifiers. Provided introduction to self, and explanation of the Ambulatory Care Manager's role.     Reviewed most recent clinic visit w/ patient who verbalized understanding. Patient given an opportunity to ask questions.    Top Challenges reviewed with the patient   1. Pt states unable to pick up two medications from most recent PCP visit.  Called pharmacy and resolved issues.  Pt will pick up meds.  2. Hx of DM2, HTN, Non-Hodkins Lymphoma (in remission)  3. Pt  States doing well, no other issues currently     The patient agrees to contact the PCP office or the Ambulatory Care Manager for questions related to their healthcare. Provided contact information for future reference.    Disease Specific:   N/A    Home Health Active: No    DME Active: No    Barriers to care? none    Advance Care Planning:   Does patient have an Advance Directive:  not on file     Medication(s):   Medication reconciliation was performed with patient, who verbalizes understanding of administration of home medications.  There were no barriers to obtaining medications identified at this time.    Referral to Pharm D needed: no     Current Outpatient Medications   Medication Sig   . lisinopril-hydroCHLOROthiazide (PRINZIDE, ZESTORETIC) 20-25 mg per tablet take 1 tablet by mouth once daily  Indications: high blood pressure   . furosemide (LASIX) 20 mg tablet Take 1 Tab by mouth daily as needed for Other (fluid retention). Indications: visible water retention   . cyclobenzaprine (FLEXERIL) 5 mg tablet Take 1 Tab by mouth three (3) times daily as needed for Muscle Spasm(s).   . Omeprazole delayed release (PRILOSEC D/R) 20 mg tablet Take 1 Tab by mouth daily.   . potassium chloride (KLOR-CON) 10 mEq tablet  Take 1 Tab by mouth daily. Indications: prevention of low potassium in the blood     No current facility-administered medications for this visit.        [facility] follow up appointment(s):   Future Appointments   Date Time Provider Department [facility]   01/05/2019  9:15 AM Paula Libra, NP [facility] SCHED        Non-[facility] follow up appointment(s):     Goals     . Attends follow up appointments on schedule      08/03/18 Patient will attend all scheduled appointments through 10/04/18      . Knowledge and adherence of prescribed medication (ie. action, side effects, missed dose, etc.).      08/03/18 Pt will take all medications prescribed to be evaluated on each outreach through  10/04/18

## 2018-08-24 NOTE — Progress Notes (Signed)
Complex Case Management      Date/Time:  08/24/2018 11:07 AM    Method of communication with patient:phone    Nurse Navigator (NN) contacted the family by telephone to perform Ambulatory Care Coordination. Verified name and DOB (PHI) with family as identifiers. Provided introduction to self, and explanation of the Ambulatory Care Manager's role.     Reviewed most recent clinic visit w/ family who verbalized understanding. Family given an opportunity to ask questions.    Top Challenges reviewed with the patient   1. Family states pt was able to pick up two medications as discussed last week.  2. Hx of DM2, HTN, Non-Hodkins Lymphoma (in remission)  3. Family states doing well, no other issues currently     The family agrees to contact the PCP office or the [facility] for questions related to their healthcare. Provided contact information for future reference.    Disease Specific:   N/A    Home Health Active: No    DME Active: No    Barriers to care? none    Advance Care Planning:   Does patient have an Advance Directive:  not on file; education provided     Medication(s):   Medication reconciliation was not performed with patient, who verbalizes understanding of administration of home medications.  There were no barriers to obtaining medications identified at this time.    Referral to Pharm D needed: no     Current Outpatient Medications   Medication Sig   ??? lisinopril-hydroCHLOROthiazide (PRINZIDE, ZESTORETIC) 20-25 mg per tablet take 1 tablet by mouth once daily  Indications: high blood pressure   ??? furosemide (LASIX) 20 mg tablet Take 1 Tab by mouth daily as needed for Other (fluid retention). Indications: visible water retention   ??? cyclobenzaprine (FLEXERIL) 5 mg tablet Take 1 Tab by mouth three (3) times daily as needed for Muscle Spasm(s).   ??? Omeprazole delayed release (PRILOSEC D/R) 20 mg tablet Take 1 Tab by mouth daily.   ??? potassium chloride (KLOR-CON) 10 mEq tablet Take 1 Tab by mouth daily.  Indications: prevention of low potassium in the blood     No current facility-administered medications for this visit.        [facility] follow up appointment(s):   Future Appointments   Date Time Provider [place]   01/05/2019  9:15 AM Candie Echevaria, NP [facility] SCHED        Non-[facility] follow up appointment(s):     Goals     ??? Attends follow up appointments on schedule      08/03/18 Patient will attend all scheduled appointments through 10/04/18      ??? Knowledge and adherence of prescribed medication (ie. action, side effects, missed dose, etc.).      08/03/18 Pt will take all medications prescribed to be evaluated on each outreach through  10/04/18

## 2018-08-24 NOTE — Progress Notes (Signed)
Complex Case Management      Date/Time:  08/24/2018 11:07 AM    Method of communication with patient:phone    Nurse Navigator (NN) contacted the family by telephone to perform Ambulatory Care Coordination. Verified name and DOB (PHI) with family as identifiers. Provided introduction to self, and explanation of the Ambulatory Care Manager's role.     Reviewed most recent clinic visit w/ family who verbalized understanding. Family given an opportunity to ask questions.    Top Challenges reviewed with the patient   1. Family states pt was able to pick up two medications as discussed last week.  2. Hx of DM2, HTN, Non-Hodkins Lymphoma (in remission)  3. Family states doing well, no other issues currently     The family agrees to contact the PCP office or the Ambulatory Care Manager for questions related to their healthcare. Provided contact information for future reference.    Disease Specific:   N/A    Home Health Active: No    DME Active: No    Barriers to care? none    Advance Care Planning:   Does patient have an Advance Directive:  not on file; education provided     Medication(s):   Medication reconciliation was not performed with patient, who verbalizes understanding of administration of home medications.  There were no barriers to obtaining medications identified at this time.    Referral to Pharm D needed: no     Current Outpatient Medications   Medication Sig   . lisinopril-hydroCHLOROthiazide (PRINZIDE, ZESTORETIC) 20-25 mg per tablet take 1 tablet by mouth once daily  Indications: high blood pressure   . furosemide (LASIX) 20 mg tablet Take 1 Tab by mouth daily as needed for Other (fluid retention). Indications: visible water retention   . cyclobenzaprine (FLEXERIL) 5 mg tablet Take 1 Tab by mouth three (3) times daily as needed for Muscle Spasm(s).   . Omeprazole delayed release (PRILOSEC D/R) 20 mg tablet Take 1 Tab by mouth daily.   . potassium chloride (KLOR-CON) 10 mEq tablet Take 1 Tab by mouth daily.  Indications: prevention of low potassium in the blood     No current facility-administered medications for this visit.        [facility] follow up appointment(s):   Future Appointments   Date Time Provider Department Center   01/05/2019  9:15 AM Paula Libra, NP [facility] SCHED        Non-[facility] follow up appointment(s):     Goals     . Attends follow up appointments on schedule      08/03/18 Patient will attend all scheduled appointments through 10/04/18      . Knowledge and adherence of prescribed medication (ie. action, side effects, missed dose, etc.).      08/03/18 Pt will take all medications prescribed to be evaluated on each outreach through  10/04/18

## 2018-08-31 NOTE — Progress Notes (Signed)
Complex Case Management      Date/Time:  08/31/2018 2:19 PM    Method of communication with patient:phone    Nurse Navigator (NN) contacted the patient by telephone to perform Ambulatory Care Coordination. Verified name and DOB with patient as identifiers. Provided introduction to self, and explanation of the Ambulatory Care Manager's role.     Reviewed most recent clinic visit w/ patient who verbalized understanding. Patient given an opportunity to ask questions.    Top Challenges reviewed with the patient   1. Pt states she was told she needed a mammogram, and she would be called to set up the appt, but has not heard from anyone, assisted w/ Appt.  2. Hx of DM2, HTN, Non-Hodkins Lymphoma (in remission)  3. Pt  States doing well, no other issues currently     The patient agrees to contact the PCP office or the [facility] for questions related to their healthcare. Provided contact information for future reference.    Disease Specific:   N/A    Home Health Active: No    DME Active: No    Barriers to care? none    Advance Care Planning:   Does patient have an Advance Directive:  not on file     Medication(s):   Medication reconciliation was performed with patient, who verbalizes understanding of administration of home medications.  There were no barriers to obtaining medications identified at this time.    Referral to Pharm D needed: no     Current Outpatient Medications   Medication Sig   ??? lisinopril-hydroCHLOROthiazide (PRINZIDE, ZESTORETIC) 20-25 mg per tablet take 1 tablet by mouth once daily  Indications: high blood pressure   ??? furosemide (LASIX) 20 mg tablet Take 1 Tab by mouth daily as needed for Other (fluid retention). Indications: visible water retention   ??? cyclobenzaprine (FLEXERIL) 5 mg tablet Take 1 Tab by mouth three (3) times daily as needed for Muscle Spasm(s).   ??? Omeprazole delayed release (PRILOSEC D/R) 20 mg tablet Take 1 Tab by mouth daily.    ??? potassium chloride (KLOR-CON) 10 mEq tablet Take 1 Tab by mouth daily. Indications: prevention of low potassium in the blood     No current facility-administered medications for this visit.        [facility] follow up appointment(s):   Future Appointments   Date Time Provider [place]   09/07/2018  2:45 PM [facility] RM 4 3D [facility]   01/05/2019  9:15 AM Candie Echevaria, NP [facility] SCHED        Non-[facility] follow up appointment(s):     Goals     ??? Attends follow up appointments on schedule      08/03/18 Patient will attend all scheduled appointments through 10/04/18      ??? Knowledge and adherence of prescribed medication (ie. action, side effects, missed dose, etc.).      08/03/18 Pt will take all medications prescribed to be evaluated on each outreach through  10/04/18

## 2018-08-31 NOTE — Progress Notes (Signed)
Complex Case Management      Date/Time:  08/31/2018 2:19 PM    Method of communication with patient:phone    Nurse Navigator (NN) contacted the patient by telephone to perform Ambulatory Care Coordination. Verified name and DOB with patient as identifiers. Provided introduction to self, and explanation of the Ambulatory Care Manager's role.     Reviewed most recent clinic visit w/ patient who verbalized understanding. Patient given an opportunity to ask questions.    Top Challenges reviewed with the patient   1. Pt states she was told she needed a mammogram, and she would be called to set up the appt, but has not heard from anyone, assisted w/ Appt.  2. Hx of DM2, HTN, Non-Hodkins Lymphoma (in remission)  3. Pt  States doing well, no other issues currently     The patient agrees to contact the PCP office or the Ambulatory Care Manager for questions related to their healthcare. Provided contact information for future reference.    Disease Specific:   N/A    Home Health Active: No    DME Active: No    Barriers to care? none    Advance Care Planning:   Does patient have an Advance Directive:  not on file     Medication(s):   Medication reconciliation was performed with patient, who verbalizes understanding of administration of home medications.  There were no barriers to obtaining medications identified at this time.    Referral to Pharm D needed: no     Current Outpatient Medications   Medication Sig   . lisinopril-hydroCHLOROthiazide (PRINZIDE, ZESTORETIC) 20-25 mg per tablet take 1 tablet by mouth once daily  Indications: high blood pressure   . furosemide (LASIX) 20 mg tablet Take 1 Tab by mouth daily as needed for Other (fluid retention). Indications: visible water retention   . cyclobenzaprine (FLEXERIL) 5 mg tablet Take 1 Tab by mouth three (3) times daily as needed for Muscle Spasm(s).   . Omeprazole delayed release (PRILOSEC D/R) 20 mg tablet Take 1 Tab by mouth daily.   . potassium chloride (KLOR-CON) 10 mEq  tablet Take 1 Tab by mouth daily. Indications: prevention of low potassium in the blood     No current facility-administered medications for this visit.        [facility] follow up appointment(s):   Future Appointments   Date Time Provider Department Center   09/07/2018  2:45 PM [facility] RM 4 3D [facility]   01/05/2019  9:15 AM Paula Libra, NP [facility] SCHED        Non-[facility] follow up appointment(s):     Goals     . Attends follow up appointments on schedule      08/03/18 Patient will attend all scheduled appointments through 10/04/18      . Knowledge and adherence of prescribed medication (ie. action, side effects, missed dose, etc.).      08/03/18 Pt will take all medications prescribed to be evaluated on each outreach through  10/04/18

## 2018-09-07 ENCOUNTER — Ambulatory Visit: Payer: MEDICARE | Primary: Family

## 2018-09-14 ENCOUNTER — Ambulatory Visit: Payer: MEDICARE | Primary: Family

## 2018-09-14 NOTE — Progress Notes (Signed)
Complex Case Management      Date/Time:  09/14/2018 2:19 PM    Method of communication with patient:phone    Nurse Navigator (NN) contacted the patient by telephone to perform Ambulatory Care Coordination. Verified name and DOB with patient as identifiers. Provided introduction to self, and explanation of the Ambulatory Care Manager's role.     Reviewed most recent clinic visit w/ patient who verbalized understanding. Patient given an opportunity to ask questions.    Top Challenges reviewed with the patient   1. Pt missed mammogram appt, states will resched.  2. Hx of DM2, HTN, Non-Hodkins Lymphoma (in remission)  3. Pt  States h/a today, will seek care if sx persist or worsen     The patient agrees to contact the PCP office or the [facility] for questions related to their healthcare. Provided contact information for future reference.    Disease Specific:   N/A    Home Health Active: No    DME Active: No    Barriers to care? none    Advance Care Planning:   Does patient have an Advance Directive:  not on file     Medication(s):   Medication reconciliation was performed with patient, who verbalizes understanding of administration of home medications.  There were no barriers to obtaining medications identified at this time.    Referral to Pharm D needed: no     Current Outpatient Medications   Medication Sig   ??? lisinopril-hydroCHLOROthiazide (PRINZIDE, ZESTORETIC) 20-25 mg per tablet take 1 tablet by mouth once daily  Indications: high blood pressure   ??? furosemide (LASIX) 20 mg tablet Take 1 Tab by mouth daily as needed for Other (fluid retention). Indications: visible water retention   ??? cyclobenzaprine (FLEXERIL) 5 mg tablet Take 1 Tab by mouth three (3) times daily as needed for Muscle Spasm(s).   ??? Omeprazole delayed release (PRILOSEC D/R) 20 mg tablet Take 1 Tab by mouth daily.   ??? potassium chloride (KLOR-CON) 10 mEq tablet Take 1 Tab by mouth daily.  Indications: prevention of low potassium in the blood     No current facility-administered medications for this visit.        [facility] follow up appointment(s):   Future Appointments   Date Time Provider [place]   01/05/2019  9:15 AM Candie Echevaria, NP [facility] SCHED        Non-[facility] follow up appointment(s):     Goals     ??? Attends follow up appointments on schedule      08/03/18 Patient will attend all scheduled appointments through 10/04/18      ??? Knowledge and adherence of prescribed medication (ie. action, side effects, missed dose, etc.).      08/03/18 Pt will take all medications prescribed to be evaluated on each outreach through  10/04/18

## 2018-09-14 NOTE — Progress Notes (Signed)
 Complex Case Management      Date/Time:  09/14/2018 2:19 PM    Method of communication with patient:phone    Nurse Navigator (NN) contacted the patient by telephone to perform Ambulatory Care Coordination. Verified name and DOB with patient as identifiers. Provided introduction to self, and explanation of the Ambulatory Care Manager's role.     Reviewed most recent clinic visit w/ patient who verbalized understanding. Patient given an opportunity to ask questions.    Top Challenges reviewed with the patient   1. Pt missed mammogram appt, states will resched.  2. Hx of DM2, HTN, Non-Hodkins Lymphoma (in remission)  3. Pt  States h/a today, will seek care if sx persist or worsen     The patient agrees to contact the PCP office or the Ambulatory Care Manager for questions related to their healthcare. Provided contact information for future reference.    Disease Specific:   N/A    Home Health Active: No    DME Active: No    Barriers to care? none    Advance Care Planning:   Does patient have an Advance Directive:  not on file     Medication(s):   Medication reconciliation was performed with patient, who verbalizes understanding of administration of home medications.  There were no barriers to obtaining medications identified at this time.    Referral to Pharm D needed: no     Current Outpatient Medications   Medication Sig   . lisinopril-hydroCHLOROthiazide (PRINZIDE, ZESTORETIC) 20-25 mg per tablet take 1 tablet by mouth once daily  Indications: high blood pressure   . furosemide (LASIX) 20 mg tablet Take 1 Tab by mouth daily as needed for Other (fluid retention). Indications: visible water retention   . cyclobenzaprine (FLEXERIL) 5 mg tablet Take 1 Tab by mouth three (3) times daily as needed for Muscle Spasm(s).   . Omeprazole delayed release (PRILOSEC D/R) 20 mg tablet Take 1 Tab by mouth daily.   . potassium chloride (KLOR-CON) 10 mEq tablet Take 1 Tab by mouth daily. Indications: prevention of low potassium in the  blood     No current facility-administered medications for this visit.        [facility] follow up appointment(s):   Future Appointments   Date Time Provider Department Center   01/05/2019  9:15 AM Napolean Darice LABOR, NP [facility] SCHED        Non-[facility] follow up appointment(s):     Goals     . Attends follow up appointments on schedule      08/03/18 Patient will attend all scheduled appointments through 10/04/18      . Knowledge and adherence of prescribed medication (ie. action, side effects, missed dose, etc.).      08/03/18 Pt will take all medications prescribed to be evaluated on each outreach through  10/04/18

## 2018-09-28 NOTE — Progress Notes (Signed)
Complex Case Management      Date/Time:  09/28/2018 2:19 PM    Method of communication with patient:phone    Nurse Navigator (NN) contacted the patient by telephone to perform Ambulatory Care Coordination. Verified name and DOB with patient as identifiers. Provided introduction to self, and explanation of the Ambulatory Care Manager's role.     Reviewed most recent clinic visit w/ patient who verbalized understanding. Patient given an opportunity to ask questions.    Top Challenges reviewed with the patient   1. Pt missed mammogram appt, states will resched after her insurance sends her which doctors accept her new insurance  2. Hx of DM2, HTN, Non-Hodkins Lymphoma (in remission)  3. Pt states feeling well, no issues today.     The patient agrees to contact the PCP office or the [facility] for questions related to their healthcare. Provided contact information for future reference.    Disease Specific:   N/A    Home Health Active: No    DME Active: No    Barriers to care? none    Advance Care Planning:   Does patient have an Advance Directive:  not on file     Medication(s):   Medication reconciliation was performed with patient, who verbalizes understanding of administration of home medications.  There were no barriers to obtaining medications identified at this time.    Referral to Pharm D needed: no     Current Outpatient Medications   Medication Sig   ??? lisinopril-hydroCHLOROthiazide (PRINZIDE, ZESTORETIC) 20-25 mg per tablet take 1 tablet by mouth once daily  Indications: high blood pressure   ??? furosemide (LASIX) 20 mg tablet Take 1 Tab by mouth daily as needed for Other (fluid retention). Indications: visible water retention   ??? cyclobenzaprine (FLEXERIL) 5 mg tablet Take 1 Tab by mouth three (3) times daily as needed for Muscle Spasm(s).   ??? Omeprazole delayed release (PRILOSEC D/R) 20 mg tablet Take 1 Tab by mouth daily.   ??? potassium chloride (KLOR-CON) 10 mEq tablet Take 1 Tab by mouth daily.  Indications: prevention of low potassium in the blood     No current facility-administered medications for this visit.        [facility] follow up appointment(s):   Future Appointments   Date Time Provider [place]   01/05/2019  9:15 AM Candie Echevaria, NP [facility] [place] SCHED        Non-[facility] follow up appointment(s):     Goals     ??? Attends follow up appointments on schedule      08/03/18 Patient will attend all scheduled appointments through 10/04/18      ??? Knowledge and adherence of prescribed medication (ie. action, side effects, missed dose, etc.).      08/03/18 Pt will take all medications prescribed to be evaluated on each outreach through  10/04/18

## 2018-09-28 NOTE — Progress Notes (Signed)
 Complex Case Management      Date/Time:  09/28/2018 2:19 PM    Method of communication with patient:phone    Nurse Navigator (NN) contacted the patient by telephone to perform Ambulatory Care Coordination. Verified name and DOB with patient as identifiers. Provided introduction to self, and explanation of the Ambulatory Care Manager's role.     Reviewed most recent clinic visit w/ patient who verbalized understanding. Patient given an opportunity to ask questions.    Top Challenges reviewed with the patient   1. Pt missed mammogram appt, states will resched after her insurance sends her which doctors accept her new insurance  2. Hx of DM2, HTN, Non-Hodkins Lymphoma (in remission)  3. Pt states feeling well, no issues today.     The patient agrees to contact the PCP office or the Ambulatory Care Manager for questions related to their healthcare. Provided contact information for future reference.    Disease Specific:   N/A    Home Health Active: No    DME Active: No    Barriers to care? none    Advance Care Planning:   Does patient have an Advance Directive:  not on file     Medication(s):   Medication reconciliation was performed with patient, who verbalizes understanding of administration of home medications.  There were no barriers to obtaining medications identified at this time.    Referral to Pharm D needed: no     Current Outpatient Medications   Medication Sig   . lisinopril-hydroCHLOROthiazide (PRINZIDE, ZESTORETIC) 20-25 mg per tablet take 1 tablet by mouth once daily  Indications: high blood pressure   . furosemide (LASIX) 20 mg tablet Take 1 Tab by mouth daily as needed for Other (fluid retention). Indications: visible water retention   . cyclobenzaprine (FLEXERIL) 5 mg tablet Take 1 Tab by mouth three (3) times daily as needed for Muscle Spasm(s).   . Omeprazole delayed release (PRILOSEC D/R) 20 mg tablet Take 1 Tab by mouth daily.   . potassium chloride (KLOR-CON) 10 mEq tablet Take 1 Tab by mouth daily.  Indications: prevention of low potassium in the blood     No current facility-administered medications for this visit.        [facility] follow up appointment(s):   Future Appointments   Date Time Provider Department Center   01/05/2019  9:15 AM Napolean Darice LABOR, NP [facility] [place] SCHED        Non-[facility] follow up appointment(s):     Goals     . Attends follow up appointments on schedule      08/03/18 Patient will attend all scheduled appointments through 10/04/18      . Knowledge and adherence of prescribed medication (ie. action, side effects, missed dose, etc.).      08/03/18 Pt will take all medications prescribed to be evaluated on each outreach through  10/04/18

## 2018-10-12 NOTE — Progress Notes (Signed)
Complex Case Management      Date/Time:  10/12/2018 2:19 PM    Method of communication with patient:phone    Nurse Navigator (NN) contacted the patient by telephone to perform Ambulatory Care Coordination. Verified name and DOB with patient as identifiers. Provided introduction to self, and explanation of the Ambulatory Care Manager's role.     Reviewed most recent clinic visit w/ patient who verbalized understanding. Patient given an opportunity to ask questions.    Top Challenges reviewed with the patient   1. Pt missed mammogram appt, states will resched after her insurance sends her which doctors accept her new insurance.  States packet has not come yet, she called [employer] and they stated they will send again.  2. Hx of DM2, HTN, Non-Hodkins Lymphoma (in remission)  3. Pt states feeling well, no issues today.     The patient agrees to contact the PCP office or the [facility] for questions related to their healthcare. Provided contact information for future reference.    Disease Specific:   N/A    Home Health Active: No    DME Active: No    Barriers to care? none    Advance Care Planning:   Does patient have an Advance Directive:  not on file     Medication(s):   Medication reconciliation was performed with patient, who verbalizes understanding of administration of home medications.  There were no barriers to obtaining medications identified at this time.    Referral to Pharm D needed: no     Current Outpatient Medications   Medication Sig   ??? lisinopril-hydroCHLOROthiazide (PRINZIDE, ZESTORETIC) 20-25 mg per tablet take 1 tablet by mouth once daily  Indications: high blood pressure   ??? furosemide (LASIX) 20 mg tablet Take 1 Tab by mouth daily as needed for Other (fluid retention). Indications: visible water retention   ??? cyclobenzaprine (FLEXERIL) 5 mg tablet Take 1 Tab by mouth three (3) times daily as needed for Muscle Spasm(s).    ??? Omeprazole delayed release (PRILOSEC D/R) 20 mg tablet Take 1 Tab by mouth daily.   ??? potassium chloride (KLOR-CON) 10 mEq tablet Take 1 Tab by mouth daily. Indications: prevention of low potassium in the blood     No current facility-administered medications for this visit.        [facility] follow up appointment(s):   Future Appointments   Date Time Provider [place]   01/05/2019  9:15 AM Candie Echevaria, NP [facility] SCHED        Non-[facility] follow up appointment(s):     Goals     ??? Attends follow up appointments on schedule      08/03/18 Patient will attend all scheduled appointments through 10/04/18      ??? Knowledge and adherence of prescribed medication (ie. action, side effects, missed dose, etc.).      08/03/18 Pt will take all medications prescribed to be evaluated on each outreach through  10/04/18

## 2018-10-12 NOTE — Progress Notes (Signed)
 Complex Case Management      Date/Time:  10/12/2018 2:19 PM    Method of communication with patient:phone    Nurse Navigator (NN) contacted the patient by telephone to perform Ambulatory Care Coordination. Verified name and DOB with patient as identifiers. Provided introduction to self, and explanation of the Ambulatory Care Manager's role.     Reviewed most recent clinic visit w/ patient who verbalized understanding. Patient given an opportunity to ask questions.    Top Challenges reviewed with the patient   1. Pt missed mammogram appt, states will resched after her insurance sends her which doctors accept her new insurance.  States packet has not come yet, she called [employer] and they stated they will send again.  2. Hx of DM2, HTN, Non-Hodkins Lymphoma (in remission)  3. Pt states feeling well, no issues today.     The patient agrees to contact the PCP office or the Ambulatory Care Manager for questions related to their healthcare. Provided contact information for future reference.    Disease Specific:   N/A    Home Health Active: No    DME Active: No    Barriers to care? none    Advance Care Planning:   Does patient have an Advance Directive:  not on file     Medication(s):   Medication reconciliation was performed with patient, who verbalizes understanding of administration of home medications.  There were no barriers to obtaining medications identified at this time.    Referral to Pharm D needed: no     Current Outpatient Medications   Medication Sig   . lisinopril-hydroCHLOROthiazide (PRINZIDE, ZESTORETIC) 20-25 mg per tablet take 1 tablet by mouth once daily  Indications: high blood pressure   . furosemide (LASIX) 20 mg tablet Take 1 Tab by mouth daily as needed for Other (fluid retention). Indications: visible water retention   . cyclobenzaprine (FLEXERIL) 5 mg tablet Take 1 Tab by mouth three (3) times daily as needed for Muscle Spasm(s).   . Omeprazole delayed release (PRILOSEC D/R) 20 mg  tablet Take 1 Tab by mouth daily.   . potassium chloride (KLOR-CON) 10 mEq tablet Take 1 Tab by mouth daily. Indications: prevention of low potassium in the blood     No current facility-administered medications for this visit.        [facility] follow up appointment(s):   Future Appointments   Date Time Provider Department Center   01/05/2019  9:15 AM Napolean Darice LABOR, NP [facility] SCHED        Non-[facility] follow up appointment(s):     Goals     . Attends follow up appointments on schedule      08/03/18 Patient will attend all scheduled appointments through 10/04/18      . Knowledge and adherence of prescribed medication (ie. action, side effects, missed dose, etc.).      08/03/18 Pt will take all medications prescribed to be evaluated on each outreach through  10/04/18

## 2018-10-30 NOTE — Progress Notes (Signed)
Patient has graduated from the Complex Case Management  program on 10/30/18.  Patient's symptoms are stable at this time.  Patient/family has the ability to self-manage.   Care management goals have been completed at this time. No further nurse navigator follow up scheduled.    Goals Addressed                 This Visit's Progress    ??? COMPLETED: Attends follow up appointments on schedule        08/03/18 Patient will attend all scheduled appointments through 10/04/18      ??? COMPLETED: Knowledge and adherence of prescribed medication (ie. action, side effects, missed dose, etc.).        08/03/18 Pt will take all medications prescribed to be evaluated on each outreach through  10/04/18            Pt has nurse navigator's contact information for any further questions, concerns, or needs.  Patients upcoming visits:    Future Appointments   Date Time Provider [place]   01/05/2019  9:15 AM Candie Echevaria, NP [facility] [place]

## 2018-10-30 NOTE — Progress Notes (Signed)
 Patient has graduated from the Complex Case Management  program on 10/30/18.  Patient's symptoms are stable at this time.  Patient/family has the ability to self-manage.   Care management goals have been completed at this time. No further nurse navigator follow up scheduled.    Goals Addressed                 This Visit's Progress    . COMPLETED: Attends follow up appointments on schedule        08/03/18 Patient will attend all scheduled appointments through 10/04/18      . COMPLETED: Knowledge and adherence of prescribed medication (ie. action, side effects, missed dose, etc.).        08/03/18 Pt will take all medications prescribed to be evaluated on each outreach through  10/04/18            Pt has nurse navigator's contact information for any further questions, concerns, or needs.  Patients upcoming visits:    Future Appointments   Date Time Provider Department Center   01/05/2019  9:15 AM Napolean Darice LABOR, NP [facility] SCHED

## 2019-01-05 ENCOUNTER — Telehealth: Attending: Family | Primary: Family

## 2019-01-05 ENCOUNTER — Encounter: Attending: Family | Primary: Family

## 2019-01-05 ENCOUNTER — Telehealth: Admit: 2019-01-05 | Payer: MEDICARE | Attending: Family | Primary: Family

## 2019-01-05 DIAGNOSIS — Z0000 Encounter for general adult medical examination without abnormal findings: Secondary | ICD-10-CM

## 2019-01-05 DIAGNOSIS — Z720 Tobacco use: Secondary | ICD-10-CM | POA: Insufficient documentation

## 2019-01-05 MED ORDER — POTASSIUM CHLORIDE SR 10 MEQ TAB, PARTICLES/CRYSTALS
10 mEq | ORAL_TABLET | Freq: Every day | ORAL | 0 refills | Status: DC
Start: 2019-01-05 — End: 2019-03-30

## 2019-01-05 MED ORDER — LISINOPRIL-HYDROCHLOROTHIAZIDE 20 MG-25 MG TAB
20-25 mg | ORAL_TABLET | ORAL | 0 refills | Status: DC
Start: 2019-01-05 — End: 2019-03-30

## 2019-01-05 NOTE — Progress Notes (Signed)
This is the Subsequent Medicare Annual Wellness Exam, performed 12 months or more after the Initial AWV or the last Subsequent AWV    Consent: Melissa Leblanc, who was seen by synchronous (real-time) audio-video technology, and/or her healthcare decision maker, is aware that this patient-initiated, Telehealth encounter on 01/05/2019 is a billable service. While AWVs are fully covered by Medicare, any services rendered on this date that are not included in an AWV are subject to additional billing, with coverage as determined by her insurance carrier. She is aware that she may receive a bill for any such additional services and has provided verbal consent to proceed: Yes.    I have reviewed the patient's medical history in detail and updated the computerized patient record.     History     Patient Active Problem List   Diagnosis Code   ??? Prediabetes R73.03   ??? Essential hypertension I10   ??? Urge incontinence N39.41   ??? 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 Back pain M54.9   ??? Advance directive discussed with patient Z71.89   ??? Chronic anemia D64.9   ??? Arthritis M19.90   ??? 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 Pedal edema R60.0   ??? Class 1 obesity due to excess calories with serious comorbidity and body mass index (BMI) of 33.0 to 33.9 in adult E66.09, Z68.33   ??? Severe obesity (HCC) E66.01   ??? Tobacco abuse Z72.0     Past Medical History:   Diagnosis Date   ??? Anxiety    ??? Arthritis 10/17/2015   ??? Asthma    ??? Bladder incontinence    ??? Cancer [facility])     lymph node cancer   ??? Depression 08/17/2014   ??? Diabetes (HCC)     border line diabetes   ??? GERD (gastroesophageal reflux disease)    ??? Hypertension    ??? Lymphoma ([place])    ??? Neck mass    ??? Prediabetes    ??? VAIN I (vaginal intraepithelial neoplasia grade I)       Past Surgical History:   Procedure Laterality Date   ??? HX GYN      BTL   ??? HX HYSTERECTOMY  2004   ??? HX OTHER SURGICAL      lymph node biopsies neck   ??? HX VASCULAR ACCESS       double right chest mediport   ??? PR RMVL TUN CTR VAD W/SUBQ PORT/PMP CTR/PRPH INSJ N/A 03/14/2016    Dr. Jimmye Norman     Current Outpatient Medications   Medication Sig Dispense Refill   ??? lisinopril-hydroCHLOROthiazide (PRINZIDE, ZESTORETIC) 20-25 mg per tablet take 1 tablet by mouth once daily  Indications: high blood pressure 90 Tab 0   ??? potassium chloride (KLOR-CON) 10 mEq tablet Take 1 Tab by mouth daily. Indications: prevention of low potassium in the blood 90 Tab 0   ??? furosemide (LASIX) 20 mg tablet Take 1 Tab by mouth daily as needed for Other (fluid retention). Indications: visible water retention 30 Tab 1   ??? cyclobenzaprine (FLEXERIL) 5 mg tablet Take 1 Tab by mouth three (3) times daily as needed for Muscle Spasm(s). 30 Tab 1   ??? Omeprazole delayed release (PRILOSEC D/R) 20 mg tablet Take 1 Tab by mouth daily. 30 Tab 1     Allergies   Allergen Reactions   ??? Vicodin [Hydrocodone-Acetaminophen] Itching       Family History   Problem Relation Age  of Onset   ??? Cancer Mother         throat   ??? Breast Cancer Mother    ??? Colon Cancer Mother    ??? Hypertension Mother    ??? Colon Cancer Father    ??? Pacemaker Father    ??? Cancer Sister         thigh that moved to lung, sounds like myosarcoma?   ??? Hypertension Sister    ??? Alcohol abuse Neg Hx    ??? Arthritis-rheumatoid Neg Hx    ??? Asthma Neg Hx    ??? Bleeding Prob Neg Hx    ??? Diabetes Neg Hx    ??? Elevated Lipids Neg Hx    ??? Headache Neg Hx    ??? Lung Disease Neg Hx    ??? Migraines Neg Hx    ??? Psychiatric Disorder Neg Hx    ??? Stroke Neg Hx    ??? Mental Retardation Neg Hx      Social History     Tobacco Use   ??? Smoking status: Current Every Day Smoker     Packs/day: 0.50     Years: 28.00     Pack years: 14.00   ??? Smokeless tobacco: Never Used   Substance Use Topics   ??? Alcohol use: Yes     Alcohol/week: 3.0 - 4.0 standard drinks     Types: 3 - 4 Glasses of wine per week     Comment: every 3 to 4 days       Depression Risk Factor Screening:      3 most recent PHQ Screens 01/05/2019   Little interest or pleasure in doing things Not at all   Feeling down, depressed, irritable, or hopeless Not at all   Total Score PHQ 2 0       Alcohol Risk Factor Screening:   Do you average 1 drink per night or more than 7 drinks a week:  No    On any one occasion in the past three months have you have had more than 3 drinks containing alcohol:  No      Functional Ability and Level of Safety:   Hearing: Hearing is good.    Activities of Daily Living:  The home contains: no safety equipment.  Patient does total self care    Ambulation: with no difficulty    Fall Risk:  Fall Risk Assessment, last 12 mths 01/05/2019   Able to walk? Yes   Fall in past 12 months? No       Abuse Screen:  Patient is not abused    Cognitive Screening   Has your family/caregiver stated any concerns about your memory: no  Cognitive Screening: Normal - Clock Drawing Test    Patient Care Team   Patient Care Team:  Candie Echevaria, NP as PCP - General (Nurse Practitioner)  Candie Echevaria, NP as PCP - [facility] Empaneled Provider  Suzy Bouchard, MD (Oncology)    Assessment/Plan   Education and counseling provided:  Are appropriate based on today's review and evaluation    Diagnoses and all orders for this visit:    1. Medicare annual wellness visit, subsequent  -     [place] 15 MIN  -     [place]; Future    2. Screening for depression  -  DEPRESSION SCREEN ANNUAL    3. Screening for alcoholism  -     PR ANNUAL ALCOHOL SCREEN 15 MIN    4. Screen for colon cancer  -     REFERRAL FOR COLONOSCOPY    5. Postmenopausal state  -     DEXA BONE DENSITY STUDY AXIAL; Future    6. Essential hypertension  -     lisinopril-hydroCHLOROthiazide (PRINZIDE, ZESTORETIC) 20-25 mg per tablet; take 1 tablet by mouth once daily  Indications: high blood pressure   -     potassium chloride (KLOR-CON) 10 mEq tablet; Take 1 Tab by mouth daily. Indications: prevention of low potassium in the blood    7. Tobacco abuse        Health Maintenance Due   Topic Date Due   ??? Pneumococcal 0-64 years (1 of 3 - PCV13) 04/27/1973   ??? DTaP/Tdap/Td series (1 - Tdap) 04/27/1988   ??? Shingrix Vaccine Age 61> (1 of 2) 04/27/2017   ??? FOBT Q1Y Age 61-75  04/27/2017   ??? A1C test (Diabetic or Prediabetic)  12/19/2017   ??? PAP AKA CERVICAL CYTOLOGY  04/12/2018   ??? Medicare Yearly Exam  08/27/2018   ??? Breast Cancer Screen Mammogram  09/04/2018       Melissa Leblanc is a 53 y.o. female who was evaluated by an audio-video encounter for concerns as above. Patient identification was verified prior to start of the visit. A caregiver was present when appropriate. Due to this being a Scientist, physiological (During IPJAS-50 public health emergency), evaluation of the following organ systems was limited: Vitals/Constitutional/EENT/Resp/CV/GI/GU/MS/Neuro/Skin/Heme-Lymph-Imm.  Pursuant to the emergency declaration under the [place], 1135 waiver authority and the [employer] Act, this Virtual Visit was conducted, with patient's (and/or legal guardian's) consent, to reduce the patient's risk of exposure to COVID-19 and provide necessary medical care.     Services were provided through a synchronous discussion virtually to substitute for in-person clinic visit. I was at home. The patient was at home.      Candie Echevaria, NP

## 2019-01-05 NOTE — Patient Instructions (Signed)
Medicare Wellness Visit, Female     The best way to live healthy is to have a lifestyle where you eat a well-balanced diet, exercise regularly, limit alcohol use, and quit all forms of tobacco/nicotine, if applicable.     Regular preventive services are another way to keep healthy. Preventive services (vaccines, screening tests, monitoring & exams) can help personalize your care plan, which helps you manage your own care. Screening tests can find health problems at the earliest stages, when they are easiest to treat.   [facility] follows the current, evidence-based guidelines published by the [place] [employer] (USPSTF) when recommending preventive services for our patients. Because we follow these guidelines, sometimes recommendations change over time as research supports it. (For example, mammograms used to be recommended annually. Even though Medicare will still pay for an annual mammogram, the newer guidelines recommend a mammogram every two years for women of average risk).  Of course, you and your doctor may decide to screen more often for some diseases, based on your risk and your co-morbidities (chronic disease you are already diagnosed with).     Preventive services for you include:  - Medicare offers their members a free annual wellness visit, which is time for you and your primary care provider to discuss and plan for your preventive service needs. Take advantage of this benefit every year!  -All adults over the age of 41 should receive the recommended pneumonia vaccines. Current USPSTF guidelines recommend a series of two vaccines for the best pneumonia protection.   -All adults should have a flu vaccine yearly and a tetanus vaccine every 10 years.   -All adults age 77 and older should receive the shingles vaccines (series of two vaccines).      -All adults age 11-70 who are overweight should have a diabetes screening test once every three years.    -All adults born between [birth year] and 1965 should be screened once for Hepatitis C.  -Other screening tests and preventive services for persons with diabetes include: an eye exam to screen for diabetic retinopathy, a kidney function test, a foot exam, and stricter control over your cholesterol.   -Cardiovascular screening for adults with routine risk involves an electrocardiogram (ECG) at intervals determined by your doctor.   -Colorectal cancer screenings should be done for adults age 28-75 with no increased risk factors for colorectal cancer.  There are a number of acceptable methods of screening for this type of cancer. Each test has its own benefits and drawbacks. Discuss with your doctor what is most appropriate for you during your annual wellness visit. The different tests include: colonoscopy (considered the best screening method), a fecal occult blood test, a fecal DNA test, and sigmoidoscopy.    -A bone mass density test is recommended when a woman turns 65 to screen for osteoporosis. This test is only recommended one time, as a screening. Some providers will use this same test as a disease monitoring tool if you already have osteoporosis.  -Breast cancer screenings are recommended every other year for women of normal risk, age 61-74.  -Cervical cancer screenings for women over age 85 are only recommended with certain risk factors.     Here is a list of your current Health Maintenance items (your personalized list of preventive services) with a due date:  Health Maintenance Due   Topic Date Due   ??? Pneumococcal Vaccine (1 of 3 - PCV13) 04/27/1973   ??? DTaP/Tdap/Td  (1 -  Tdap) 04/27/1988   ??? Shingles Vaccine (1 of 2) 04/27/2017   ??? Colon Cancer Stool Test  04/27/2017   ??? Hemoglobin A1C    12/19/2017   ??? Pap Test  04/12/2018   ??? Annual Well Visit  08/27/2018   ??? Mammogram  09/04/2018

## 2019-01-05 NOTE — Progress Notes (Signed)
Melissa Leblanc is a 53 y.o. female who was seen by synchronous (real-time) audio-video technology on 01/05/2019.      Consent: Melissa Leblanc, who was seen by synchronous (real-time) audio-video technology, and/or her healthcare decision maker, is aware that this patient-initiated, Telehealth encounter on 01/05/2019 is a billable service, with coverage as determined by her insurance carrier. She is aware that she may receive a bill and has provided verbal consent to proceed: Yes.      Assessment & Plan:   Diagnoses and all orders for this visit:    1. Medicare annual wellness visit, subsequent  -     [place] 15 MIN  -     [place]; Future    2. Screening for depression  -     DEPRESSION SCREEN ANNUAL    3. Screening for alcoholism  -     PR ANNUAL ALCOHOL SCREEN 15 MIN    4. Screen for colon cancer  -     REFERRAL FOR COLONOSCOPY    5. Postmenopausal state  -     DEXA BONE DENSITY STUDY AXIAL; Future    6. Essential hypertension  -     lisinopril-hydroCHLOROthiazide (PRINZIDE, ZESTORETIC) 20-25 mg per tablet; take 1 tablet by mouth once daily  Indications: high blood pressure  -     potassium chloride (KLOR-CON) 10 mEq tablet; Take 1 Tab by mouth daily. Indications: prevention of low potassium in the blood    7. Tobacco abuse      Medicare wellness completed  Fasting Labs   Prescription given  The patient was counseled on the dangers of tobacco use, and was advised to quit.  Reviewed strategies to maximize success, including removing cigarettes and smoking materials from environment.        Subjective:   Melissa Leblanc is a 53 y.o. female who was seen for Hypertension    The patient presents for an Audio-visual teleconference appointment for hypertension follow-up care.  Notes she has been feeling well and denies any chest pain, palpitation, or lower extremity edema.  She is compliant  with taking her medication daily but notes she does not take her blood pressure at home.  Her previous blood pressures in the office have been stable.    Prior to Admission medications    Medication Sig Start Date End Date Taking? Authorizing Provider   lisinopril-hydroCHLOROthiazide (PRINZIDE, ZESTORETIC) 20-25 mg per tablet take 1 tablet by mouth once daily  Indications: high blood pressure 01/05/19  Yes Candie Echevaria, NP   potassium chloride (KLOR-CON) 10 mEq tablet Take 1 Tab by mouth daily. Indications: prevention of low potassium in the blood 01/05/19  Yes Candie Echevaria, NP   furosemide (LASIX) 20 mg tablet Take 1 Tab by mouth daily as needed for Other (fluid retention). Indications: visible water retention 08/06/18  Yes Candie Echevaria, NP   cyclobenzaprine (FLEXERIL) 5 mg tablet Take 1 Tab by mouth three (3) times daily as needed for Muscle Spasm(s). 08/06/18  Yes Candie Echevaria, NP   Omeprazole delayed release (PRILOSEC D/R) 20 mg tablet Take 1 Tab by mouth daily. 08/06/18  Yes Candie Echevaria, NP   lisinopril-hydroCHLOROthiazide (PRINZIDE, ZESTORETIC) 20-25 mg per tablet take 1 tablet by mouth once daily  Indications: high blood pressure 08/06/18 01/05/19  Candie Echevaria, NP   potassium chloride (KLOR-CON) 10 mEq  tablet Take 1 Tab by mouth daily. Indications: prevention of low potassium in the blood 08/06/18 01/05/19  Candie Echevaria, NP     Allergies   Allergen Reactions   ??? Vicodin [Hydrocodone-Acetaminophen] Itching       Patient Active Problem List   Diagnosis Code   ??? Prediabetes R73.03   ??? Essential hypertension I10   ??? Urge incontinence N39.41   ??? 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 Back pain M54.9   ??? Advance directive discussed with patient Z71.89   ??? Chronic anemia D64.9   ??? Arthritis M19.90   ??? 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 Pedal edema R60.0    ??? Class 1 obesity due to excess calories with serious comorbidity and body mass index (BMI) of 33.0 to 33.9 in adult E66.09, Z68.33   ??? Severe obesity (HCC) E66.01   ??? Tobacco abuse Z72.0     Patient Active Problem List    Diagnosis Date Noted   ??? Tobacco abuse 01/05/2019   ??? Severe obesity ([place]) 11/28/2017   ??? Pedal edema 04/18/2017   ??? Class 1 obesity due to excess calories with serious comorbidity and body mass index (BMI) of 33.0 to 33.9 in adult 04/18/2017   ??? 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 Chronic anemia 10/17/2015   ??? Arthritis 10/17/2015   ??? Advance directive discussed with patient 08/17/2014   ??? Back pain 11/27/2012   ??? Hodgkin's disease of lymph nodes of head, face, and neck ([place]) 09/15/2012   ??? Urge incontinence 12/27/2011   ??? Prediabetes 06/03/2011   ??? Essential hypertension 06/03/2011     Current Outpatient Medications   Medication Sig Dispense Refill   ??? lisinopril-hydroCHLOROthiazide (PRINZIDE, ZESTORETIC) 20-25 mg per tablet take 1 tablet by mouth once daily  Indications: high blood pressure 90 Tab 0   ??? potassium chloride (KLOR-CON) 10 mEq tablet Take 1 Tab by mouth daily. Indications: prevention of low potassium in the blood 90 Tab 0   ??? furosemide (LASIX) 20 mg tablet Take 1 Tab by mouth daily as needed for Other (fluid retention). Indications: visible water retention 30 Tab 1   ??? cyclobenzaprine (FLEXERIL) 5 mg tablet Take 1 Tab by mouth three (3) times daily as needed for Muscle Spasm(s). 30 Tab 1   ??? Omeprazole delayed release (PRILOSEC D/R) 20 mg tablet Take 1 Tab by mouth daily. 30 Tab 1     Allergies   Allergen Reactions   ??? Vicodin [Hydrocodone-Acetaminophen] Itching     Past Medical History:   Diagnosis Date   ??? Anxiety    ??? Arthritis 10/17/2015   ??? Asthma    ??? Bladder incontinence    ??? Cancer [facility])     lymph node cancer   ??? Depression 08/17/2014   ??? Diabetes (HCC)     border line diabetes   ??? GERD (gastroesophageal reflux disease)    ??? Hypertension    ??? Lymphoma ([place])    ??? Neck mass     ??? Prediabetes    ??? VAIN I (vaginal intraepithelial neoplasia grade I)        ROS    Constitutional: No apparent distress noted  General- negative for fever, chills or fatigue  Eyes- negative visual changes  CV- denies chest pain, palpitation  Pul: negative cough or SOB  GI: negative nausea, flank pain, diarrhea, constipation  Urinary:- No dysuria or polyuria  MS- negative myalgia, negative joint pain  Neuro-  negative headache, dizziness or weakness  Skin- negative for rashes or lesions.    Objective:   Vital Signs: (As obtained by patient/caregiver at home)  Visit Vitals  LMP  (LMP Unknown)        [INSTRUCTIONS:  "[x] " Indicates a positive item  "[] " Indicates a negative item  -- DELETE ALL ITEMS NOT EXAMINED]    Constitutional: [x]  Appears well-developed and well-nourished [x]  No apparent distress      []  Abnormal -     Mental status: [x]  Alert and awake  [x]  Oriented to person/place/time [x]  Able to follow commands    []  Abnormal -     Eyes:   EOM    [x]   Normal    []  Abnormal -   Sclera  [x]   Normal    []  Abnormal -          Discharge [x]   None visible   []  Abnormal -     HENT: [x]  Normocephalic, atraumatic  []  Abnormal -   [x]  Mouth/Throat: Mucous membranes are moist    External Ears [x]  Normal  []  Abnormal -    Neck: [x]  No visualized mass []  Abnormal -     Pulmonary/Chest: [x]  Respiratory effort normal   [x]  No visualized signs of difficulty breathing or respiratory distress        []  Abnormal -      Musculoskeletal:   [x]  Normal gait with no signs of ataxia         [x]  Normal range of motion of neck        []  Abnormal -     Neurological:        [x]  No Facial Asymmetry (Cranial nerve 7 motor function) (limited exam due to video visit)          [x]  No gaze palsy        []  Abnormal -          Skin:        [x]  No significant exanthematous lesions or discoloration noted on facial skin         []  Abnormal -            Psychiatric:       [x]  Normal Affect []  Abnormal -        [x]  No Hallucinations     Other pertinent observable physical exam findings:-        We discussed the expected course, resolution and complications of the diagnosis(es) in detail.  Medication risks, benefits, costs, interactions, and alternatives were discussed as indicated.  I advised her to contact the office if her condition worsens, changes or fails to improve as anticipated. She expressed understanding with the diagnosis(es) and plan.       Krystianna Soth is a 53 y.o. female who was evaluated by a video visit encounter for concerns as above. Patient identification was verified prior to start of the visit. A caregiver was present when appropriate. 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  Pursuant to the emergency declaration under the [place], 1135 waiver authority and the [employer] Act, this Virtual  Visit was conducted, with patient's (and/or legal guardian's) consent, to reduce the patient's risk of exposure to COVID-19 and provide necessary medical care.     Services were provided through a video synchronous discussion virtually to substitute for in-person clinic visit.  Patient and provider were located at their individual homes.      Candie Echevaria, NP

## 2019-01-05 NOTE — Progress Notes (Signed)
Melissa Leblanc is a 53 y.o. female who was seen by synchronous (real-time) audio-video technology on 01/05/2019.      Consent: Melissa Leblanc, who was seen by synchronous (real-time) audio-video technology, and/or her healthcare decision maker, is aware that this patient-initiated, Telehealth encounter on 01/05/2019 is a billable service, with coverage as determined by her insurance carrier. She is aware that she may receive a bill and has provided verbal consent to proceed: Yes.      Assessment & Plan:   Diagnoses and all orders for this visit:    1. Medicare annual wellness visit, subsequent  -     [place] 15 MIN  -     [place]; Future    2. Screening for depression  -     DEPRESSION SCREEN ANNUAL    3. Screening for alcoholism  -     PR ANNUAL ALCOHOL SCREEN 15 MIN    4. Screen for colon cancer  -     REFERRAL FOR COLONOSCOPY    5. Postmenopausal state  -     DEXA BONE DENSITY STUDY AXIAL; Future    6. Essential hypertension  -     lisinopril-hydroCHLOROthiazide (PRINZIDE, ZESTORETIC) 20-25 mg per tablet; take 1 tablet by mouth once daily  Indications: high blood pressure  -     potassium chloride (KLOR-CON) 10 mEq tablet; Take 1 Tab by mouth daily. Indications: prevention of low potassium in the blood    7. Tobacco abuse      Medicare wellness completed  Fasting Labs   Prescription given  The patient was counseled on the dangers of tobacco use, and was advised to quit.  Reviewed strategies to maximize success, including removing cigarettes and smoking materials from environment.        Subjective:   Melissa Leblanc is a 53 y.o. female who was seen for Hypertension    The patient presents for an Audio-visual teleconference appointment for hypertension follow-up care.  Notes she has been feeling well and denies any chest pain, palpitation, or lower extremity edema.  She is compliant with taking her medication daily but notes  she does not take her blood pressure at home.  Her previous blood pressures in the office have been stable.    Prior to Admission medications    Medication Sig Start Date End Date Taking? Authorizing Provider   lisinopril-hydroCHLOROthiazide (PRINZIDE, ZESTORETIC) 20-25 mg per tablet take 1 tablet by mouth once daily  Indications: high blood pressure 01/05/19  Yes Candie Echevaria, NP   potassium chloride (KLOR-CON) 10 mEq tablet Take 1 Tab by mouth daily. Indications: prevention of low potassium in the blood 01/05/19  Yes Candie Echevaria, NP   furosemide (LASIX) 20 mg tablet Take 1 Tab by mouth daily as needed for Other (fluid retention). Indications: visible water retention 08/06/18  Yes Candie Echevaria, NP   cyclobenzaprine (FLEXERIL) 5 mg tablet Take 1 Tab by mouth three (3) times daily as needed for Muscle Spasm(s). 08/06/18  Yes Candie Echevaria, NP   Omeprazole delayed release (PRILOSEC D/R) 20 mg tablet Take 1 Tab by mouth daily. 08/06/18  Yes Candie Echevaria, NP   lisinopril-hydroCHLOROthiazide (PRINZIDE, ZESTORETIC) 20-25 mg per tablet take 1 tablet by mouth once daily  Indications: high blood pressure 08/06/18 01/05/19  Candie Echevaria, NP   potassium chloride (KLOR-CON) 10 mEq  tablet Take 1 Tab by mouth daily. Indications: prevention of low potassium in the blood 08/06/18 01/05/19  Candie Echevaria, NP     Allergies   Allergen Reactions   ??? Vicodin [Hydrocodone-Acetaminophen] Itching       Patient Active Problem List   Diagnosis Code   ??? Prediabetes R73.03   ??? Essential hypertension I10   ??? Urge incontinence N39.41   ??? 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 Back pain M54.9   ??? Advance directive discussed with patient Z71.89   ??? Chronic anemia D64.9   ??? Arthritis M19.90   ??? 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 Pedal edema R60.0   ??? Class 1 obesity due to excess calories with serious comorbidity and body mass index (BMI) of 33.0 to 33.9 in  adult E66.09, Z68.33   ??? Severe obesity (HCC) E66.01   ??? Tobacco abuse Z72.0     Patient Active Problem List    Diagnosis Date Noted   ??? Tobacco abuse 01/05/2019   ??? Severe obesity ([place]) 11/28/2017   ??? Pedal edema 04/18/2017   ??? Class 1 obesity due to excess calories with serious comorbidity and body mass index (BMI) of 33.0 to 33.9 in adult 04/18/2017   ??? 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 Chronic anemia 10/17/2015   ??? Arthritis 10/17/2015   ??? Advance directive discussed with patient 08/17/2014   ??? Back pain 11/27/2012   ??? Hodgkin's disease of lymph nodes of head, face, and neck ([place]) 09/15/2012   ??? Urge incontinence 12/27/2011   ??? Prediabetes 06/03/2011   ??? Essential hypertension 06/03/2011     Current Outpatient Medications   Medication Sig Dispense Refill   ??? lisinopril-hydroCHLOROthiazide (PRINZIDE, ZESTORETIC) 20-25 mg per tablet take 1 tablet by mouth once daily  Indications: high blood pressure 90 Tab 0   ??? potassium chloride (KLOR-CON) 10 mEq tablet Take 1 Tab by mouth daily. Indications: prevention of low potassium in the blood 90 Tab 0   ??? furosemide (LASIX) 20 mg tablet Take 1 Tab by mouth daily as needed for Other (fluid retention). Indications: visible water retention 30 Tab 1   ??? cyclobenzaprine (FLEXERIL) 5 mg tablet Take 1 Tab by mouth three (3) times daily as needed for Muscle Spasm(s). 30 Tab 1   ??? Omeprazole delayed release (PRILOSEC D/R) 20 mg tablet Take 1 Tab by mouth daily. 30 Tab 1     Allergies   Allergen Reactions   ??? Vicodin [Hydrocodone-Acetaminophen] Itching     Past Medical History:   Diagnosis Date   ??? Anxiety    ??? Arthritis 10/17/2015   ??? Asthma    ??? Bladder incontinence    ??? Cancer [facility])     lymph node cancer   ??? Depression 08/17/2014   ??? Diabetes (HCC)     border line diabetes   ??? GERD (gastroesophageal reflux disease)    ??? Hypertension    ??? Lymphoma ([place])    ??? Neck mass    ??? Prediabetes    ??? VAIN I (vaginal intraepithelial neoplasia grade I)        ROS    Constitutional: No apparent  distress noted  General- negative for fever, chills or fatigue  Eyes- negative visual changes  CV- denies chest pain, palpitation  Pul: negative cough or SOB  GI: negative nausea, flank pain, diarrhea, constipation  Urinary:- No dysuria or polyuria  MS- negative myalgia, negative joint pain  Neuro-  negative headache, dizziness or weakness  Skin- negative for rashes or lesions.    Objective:   Vital Signs: (As obtained by patient/caregiver at home)  Visit Vitals  LMP  (LMP Unknown)        [INSTRUCTIONS:  "[x] " Indicates a positive item  "[] " Indicates a negative item  -- DELETE ALL ITEMS NOT EXAMINED]    Constitutional: [x]  Appears well-developed and well-nourished [x]  No apparent distress      []  Abnormal -     Mental status: [x]  Alert and awake  [x]  Oriented to person/place/time [x]  Able to follow commands    []  Abnormal -     Eyes:   EOM    [x]   Normal    []  Abnormal -   Sclera  [x]   Normal    []  Abnormal -          Discharge [x]   None visible   []  Abnormal -     HENT: [x]  Normocephalic, atraumatic  []  Abnormal -   [x]  Mouth/Throat: Mucous membranes are moist    External Ears [x]  Normal  []  Abnormal -    Neck: [x]  No visualized mass []  Abnormal -     Pulmonary/Chest: [x]  Respiratory effort normal   [x]  No visualized signs of difficulty breathing or respiratory distress        []  Abnormal -      Musculoskeletal:   [x]  Normal gait with no signs of ataxia         [x]  Normal range of motion of neck        []  Abnormal -     Neurological:        [x]  No Facial Asymmetry (Cranial nerve 7 motor function) (limited exam due to video visit)          [x]  No gaze palsy        []  Abnormal -          Skin:        [x]  No significant exanthematous lesions or discoloration noted on facial skin         []  Abnormal -            Psychiatric:       [x]  Normal Affect []  Abnormal -        [x]  No Hallucinations    Other pertinent observable physical exam findings:-        We discussed the expected course, resolution and complications of  the diagnosis(es) in detail.  Medication risks, benefits, costs, interactions, and alternatives were discussed as indicated.  I advised her to contact the office if her condition worsens, changes or fails to improve as anticipated. She expressed understanding with the diagnosis(es) and plan.       Melissa Leblanc is a 53 y.o. female who was evaluated by a video visit encounter for concerns as above. Patient identification was verified prior to start of the visit. A caregiver was present when appropriate. Due to this being a Scientist, physiological (During CBJSE-83 public health emergency), evaluation of the following organ systems was limited: Vitals/Constitutional/EENT/Resp/CV/GI/GU/MS/Neuro/Skin/Heme-Lymph-Imm.  Pursuant to the emergency declaration under the [place], 1135 waiver authority and the [employer] Act, this Virtual  Visit was conducted, with patient's (and/or legal guardian's) consent, to reduce the patient's risk of exposure to COVID-19 and provide necessary medical care.     Services were provided through a video synchronous discussion virtually to substitute for in-person clinic visit.  Patient and provider were located at their individual homes.      Candie Echevaria, NP

## 2019-01-05 NOTE — Progress Notes (Signed)
This is the Subsequent Medicare Annual Wellness Exam, performed 12 months or more after the Initial AWV or the last Subsequent AWV    Consent: Melissa Leblanc, who was seen by synchronous (real-time) audio-video technology, and/or her healthcare decision maker, is aware that this patient-initiated, Telehealth encounter on 01/05/2019 is a billable service. While AWVs are fully covered by Medicare, any services rendered on this date that are not included in an AWV are subject to additional billing, with coverage as determined by her insurance carrier. She is aware that she may receive a bill for any such additional services and has provided verbal consent to proceed: Yes.    I have reviewed the patient's medical history in detail and updated the computerized patient record.     History     Patient Active Problem List   Diagnosis Code   ??? Prediabetes R73.03   ??? Essential hypertension I10   ??? Urge incontinence N39.41   ??? 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 Back pain M54.9   ??? Advance directive discussed with patient Z71.89   ??? Chronic anemia D64.9   ??? Arthritis M19.90   ??? 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 Pedal edema R60.0   ??? Class 1 obesity due to excess calories with serious comorbidity and body mass index (BMI) of 33.0 to 33.9 in adult E66.09, Z68.33   ??? Severe obesity (HCC) E66.01   ??? Tobacco abuse Z72.0     Past Medical History:   Diagnosis Date   ??? Anxiety    ??? Arthritis 10/17/2015   ??? Asthma    ??? Bladder incontinence    ??? Cancer [facility])     lymph node cancer   ??? Depression 08/17/2014   ??? Diabetes (HCC)     border line diabetes   ??? GERD (gastroesophageal reflux disease)    ??? Hypertension    ??? Lymphoma ([place])    ??? Neck mass    ??? Prediabetes    ??? VAIN I (vaginal intraepithelial neoplasia grade I)       Past Surgical History:   Procedure Laterality Date   ??? HX GYN      BTL   ??? HX HYSTERECTOMY  2004   ??? HX OTHER SURGICAL      lymph node biopsies neck   ??? HX VASCULAR ACCESS      double  right chest mediport   ??? PR RMVL TUN CTR VAD W/SUBQ PORT/PMP CTR/PRPH INSJ N/A 03/14/2016    Dr. Jimmye Norman     Current Outpatient Medications   Medication Sig Dispense Refill   ??? lisinopril-hydroCHLOROthiazide (PRINZIDE, ZESTORETIC) 20-25 mg per tablet take 1 tablet by mouth once daily  Indications: high blood pressure 90 Tab 0   ??? potassium chloride (KLOR-CON) 10 mEq tablet Take 1 Tab by mouth daily. Indications: prevention of low potassium in the blood 90 Tab 0   ??? furosemide (LASIX) 20 mg tablet Take 1 Tab by mouth daily as needed for Other (fluid retention). Indications: visible water retention 30 Tab 1   ??? cyclobenzaprine (FLEXERIL) 5 mg tablet Take 1 Tab by mouth three (3) times daily as needed for Muscle Spasm(s). 30 Tab 1   ??? Omeprazole delayed release (PRILOSEC D/R) 20 mg tablet Take 1 Tab by mouth daily. 30 Tab 1     Allergies   Allergen Reactions   ??? Vicodin [Hydrocodone-Acetaminophen] Itching       Family History   Problem Relation Age  of Onset   ??? Cancer Mother         throat   ??? Breast Cancer Mother    ??? Colon Cancer Mother    ??? Hypertension Mother    ??? Colon Cancer Father    ??? Pacemaker Father    ??? Cancer Sister         thigh that moved to lung, sounds like myosarcoma?   ??? Hypertension Sister    ??? Alcohol abuse Neg Hx    ??? Arthritis-rheumatoid Neg Hx    ??? Asthma Neg Hx    ??? Bleeding Prob Neg Hx    ??? Diabetes Neg Hx    ??? Elevated Lipids Neg Hx    ??? Headache Neg Hx    ??? Lung Disease Neg Hx    ??? Migraines Neg Hx    ??? Psychiatric Disorder Neg Hx    ??? Stroke Neg Hx    ??? Mental Retardation Neg Hx      Social History     Tobacco Use   ??? Smoking status: Current Every Day Smoker     Packs/day: 0.50     Years: 28.00     Pack years: 14.00   ??? Smokeless tobacco: Never Used   Substance Use Topics   ??? Alcohol use: Yes     Alcohol/week: 3.0 - 4.0 standard drinks     Types: 3 - 4 Glasses of wine per week     Comment: every 3 to 4 days       Depression Risk Factor Screening:     3 most recent PHQ Screens 01/05/2019    Little interest or pleasure in doing things Not at all   Feeling down, depressed, irritable, or hopeless Not at all   Total Score PHQ 2 0       Alcohol Risk Factor Screening:   Do you average 1 drink per night or more than 7 drinks a week:  No    On any one occasion in the past three months have you have had more than 3 drinks containing alcohol:  No      Functional Ability and Level of Safety:   Hearing: Hearing is good.    Activities of Daily Living:  The home contains: no safety equipment.  Patient does total self care    Ambulation: with no difficulty    Fall Risk:  Fall Risk Assessment, last 12 mths 01/05/2019   Able to walk? Yes   Fall in past 12 months? No       Abuse Screen:  Patient is not abused    Cognitive Screening   Has your family/caregiver stated any concerns about your memory: no  Cognitive Screening: Normal - Clock Drawing Test    Patient Care Team   Patient Care Team:  Candie Echevaria, NP as PCP - General (Nurse Practitioner)  Candie Echevaria, NP as PCP - [facility] Empaneled Provider  Suzy Bouchard, MD (Oncology)    Assessment/Plan   Education and counseling provided:  Are appropriate based on today's review and evaluation    Diagnoses and all orders for this visit:    1. Medicare annual wellness visit, subsequent  -     [place] 15 MIN  -     [place]; Future    2. Screening for depression  -  DEPRESSION SCREEN ANNUAL    3. Screening for alcoholism  -     PR ANNUAL ALCOHOL SCREEN 15 MIN    4. Screen for colon cancer  -     REFERRAL FOR COLONOSCOPY    5. Postmenopausal state  -     DEXA BONE DENSITY STUDY AXIAL; Future    6. Essential hypertension  -     lisinopril-hydroCHLOROthiazide (PRINZIDE, ZESTORETIC) 20-25 mg per tablet; take 1 tablet by mouth once daily  Indications: high blood pressure  -     potassium chloride (KLOR-CON) 10 mEq tablet; Take 1 Tab by mouth daily. Indications:  prevention of low potassium in the blood    7. Tobacco abuse        Health Maintenance Due   Topic Date Due   ??? Pneumococcal 0-64 years (1 of 3 - PCV13) 04/27/1973   ??? DTaP/Tdap/Td series (1 - Tdap) 04/27/1988   ??? Shingrix Vaccine Age 57> (1 of 2) 04/27/2017   ??? FOBT Q1Y Age 57-75  04/27/2017   ??? A1C test (Diabetic or Prediabetic)  12/19/2017   ??? PAP AKA CERVICAL CYTOLOGY  04/12/2018   ??? Medicare Yearly Exam  08/27/2018   ??? Breast Cancer Screen Mammogram  09/04/2018       Melissa Leblanc is a 53 y.o. female who was evaluated by an audio-video encounter for concerns as above. Patient identification was verified prior to start of the visit. A caregiver was present when appropriate. Due to this being a Scientist, physiological (During ZOXWR-60 public health emergency), evaluation of the following organ systems was limited: Vitals/Constitutional/EENT/Resp/CV/GI/GU/MS/Neuro/Skin/Heme-Lymph-Imm.  Pursuant to the emergency declaration under the [place], 1135 waiver authority and the [employer] Act, this Virtual Visit was conducted, with patient's (and/or legal guardian's) consent, to reduce the patient's risk of exposure to COVID-19 and provide necessary medical care.     Services were provided through a synchronous discussion virtually to substitute for in-person clinic visit. I was at home. The patient was at home.      Candie Echevaria, NP

## 2019-01-18 ENCOUNTER — Inpatient Hospital Stay: Payer: MEDICAID | Attending: Family | Primary: Family

## 2019-03-30 ENCOUNTER — Telehealth: Attending: Family | Primary: Family

## 2019-03-30 ENCOUNTER — Encounter: Attending: Family | Primary: Family

## 2019-03-30 ENCOUNTER — Telehealth: Admit: 2019-03-30 | Payer: MEDICARE | Attending: Family | Primary: Family

## 2019-03-30 DIAGNOSIS — M62838 Other muscle spasm: Secondary | ICD-10-CM

## 2019-03-30 MED ORDER — LISINOPRIL-HYDROCHLOROTHIAZIDE 20 MG-25 MG TAB
20-25 mg | ORAL_TABLET | ORAL | 1 refills | Status: DC
Start: 2019-03-30 — End: 2019-08-19

## 2019-03-30 MED ORDER — CYCLOBENZAPRINE 5 MG TAB
5 mg | ORAL_TABLET | Freq: Three times a day (TID) | ORAL | 1 refills | Status: DC | PRN
Start: 2019-03-30 — End: 2020-09-26

## 2019-03-30 MED ORDER — POTASSIUM CHLORIDE SR 10 MEQ TAB, PARTICLES/CRYSTALS
10 mEq | ORAL_TABLET | Freq: Every day | ORAL | 1 refills | Status: DC
Start: 2019-03-30 — End: 2019-08-19

## 2019-03-30 MED ORDER — OMEPRAZOLE 20 MG TAB, DELAYED RELEASE
20 mg | ORAL_TABLET | Freq: Every day | ORAL | 2 refills | Status: DC
Start: 2019-03-30 — End: 2020-09-26

## 2019-03-30 NOTE — Progress Notes (Signed)
Melissa Leblanc is a 53 y.o. female who was seen by synchronous (real-time) audio-video technology on 03/30/2019 for Hypertension        Assessment & Plan:   Diagnoses and all orders for this visit:    1. Muscle spasm  -     cyclobenzaprine (Flexeril) 5 mg tablet; Take 1 Tab by mouth three (3) times daily as needed for Muscle Spasm(s).    2. Essential hypertension  -     lisinopril-hydroCHLOROthiazide (PRINZIDE, ZESTORETIC) 20-25 mg per tablet; take 1 tablet by mouth once daily  Indications: high blood pressure  -     potassium chloride (KLOR-CON) 10 mEq tablet; Take 1 Tab by mouth daily. Indications: prevention of low potassium in the blood    3. Gastroesophageal reflux disease, esophagitis presence not specified  -     Omeprazole delayed release (PRILOSEC D/R) 20 mg tablet; Take 1 Tab by mouth daily.            Subjective:     The patient presents for an Audio-visual teleconference appointment for routine follow-up care.  Patient has a prior medical history for hypertension, prediabetes, GERD and intermittent muscle spasms.  Per the patient she has been feeling well.  She denies any chest pain, palpitation, or lower extremity edema.  Per the patient she has worked on lifestyle modification.  She is avoiding eating snack food and eats more vegetables, fruit and salads.  Hypertension-previous blood pressures have been controlled.  She is prescribed Prinzide daily denies any cough.  She is compliant with taking her medication daily.  GERD-Per the patient she takes omeprazole as needed.  Notes her GERD is associated with various types of foods she eats.  Muscle spasms-patient does have a history of intermittent muscle spasms.  Notes that she has been walking twice a day to improve her muscle spasms.      Prior to Admission medications    Medication Sig Start Date End Date Taking? Authorizing Provider   cyclobenzaprine (Flexeril) 5 mg tablet Take 1 Tab by mouth three (3) times  daily as needed for Muscle Spasm(s). 03/30/19  Yes Candie Echevaria, NP   lisinopril-hydroCHLOROthiazide (PRINZIDE, ZESTORETIC) 20-25 mg per tablet take 1 tablet by mouth once daily  Indications: high blood pressure 03/30/19  Yes Candie Echevaria, NP   Omeprazole delayed release (PRILOSEC D/R) 20 mg tablet Take 1 Tab by mouth daily. 03/30/19  Yes Candie Echevaria, NP   potassium chloride (KLOR-CON) 10 mEq tablet Take 1 Tab by mouth daily. Indications: prevention of low potassium in the blood 03/30/19  Yes Candie Echevaria, NP   lisinopril-hydroCHLOROthiazide (PRINZIDE, ZESTORETIC) 20-25 mg per tablet take 1 tablet by mouth once daily  Indications: high blood pressure 01/05/19 03/30/19  Candie Echevaria, NP   potassium chloride (KLOR-CON) 10 mEq tablet Take 1 Tab by mouth daily. Indications: prevention of low potassium in the blood 01/05/19 03/30/19  Candie Echevaria, NP   furosemide (LASIX) 20 mg tablet Take 1 Tab by mouth daily as needed for Other (fluid retention). Indications: visible water retention 08/06/18 03/30/19  Candie Echevaria, NP   cyclobenzaprine (FLEXERIL) 5 mg tablet Take 1 Tab by mouth three (3) times daily as needed for Muscle Spasm(s). 08/06/18 03/30/19  Candie Echevaria, NP   Omeprazole delayed release (PRILOSEC D/R) 20 mg tablet Take 1 Tab by mouth daily. 08/06/18 03/30/19  Candie Echevaria, NP     Patient Active Problem List   Diagnosis Code   ???  Prediabetes R73.03   ??? Essential hypertension I10   ??? Urge incontinence N39.41   ??? 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 Back pain M54.9   ??? Advance directive discussed with patient Z71.89   ??? Chronic anemia D64.9   ??? Arthritis M19.90   ??? 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 Pedal edema R60.0   ??? Class 1 obesity due to excess calories with serious comorbidity and body mass index (BMI) of 33.0 to 33.9 in adult E66.09, Z68.33   ??? Severe obesity (HCC) E66.01   ??? Tobacco abuse Z72.0      Patient Active Problem List    Diagnosis Date Noted   ??? Tobacco abuse 01/05/2019   ??? Severe obesity ([place]) 11/28/2017   ??? Pedal edema 04/18/2017   ??? Class 1 obesity due to excess calories with serious comorbidity and body mass index (BMI) of 33.0 to 33.9 in adult 04/18/2017   ??? 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 Chronic anemia 10/17/2015   ??? Arthritis 10/17/2015   ??? Advance directive discussed with patient 08/17/2014   ??? Back pain 11/27/2012   ??? Hodgkin's disease of lymph nodes of head, face, and neck ([place]) 09/15/2012   ??? Urge incontinence 12/27/2011   ??? Prediabetes 06/03/2011   ??? Essential hypertension 06/03/2011     Current Outpatient Medications   Medication Sig Dispense Refill   ??? cyclobenzaprine (Flexeril) 5 mg tablet Take 1 Tab by mouth three (3) times daily as needed for Muscle Spasm(s). 30 Tab 1   ??? lisinopril-hydroCHLOROthiazide (PRINZIDE, ZESTORETIC) 20-25 mg per tablet take 1 tablet by mouth once daily  Indications: high blood pressure 90 Tab 1   ??? Omeprazole delayed release (PRILOSEC D/R) 20 mg tablet Take 1 Tab by mouth daily. 30 Tab 2   ??? potassium chloride (KLOR-CON) 10 mEq tablet Take 1 Tab by mouth daily. Indications: prevention of low potassium in the blood 90 Tab 1     Allergies   Allergen Reactions   ??? Vicodin [Hydrocodone-Acetaminophen] Itching     Past Medical History:   Diagnosis Date   ??? Anxiety    ??? Arthritis 10/17/2015   ??? Asthma    ??? Bladder incontinence    ??? Cancer [facility])     lymph node cancer   ??? Depression 08/17/2014   ??? Diabetes (HCC)     border line diabetes   ??? GERD (gastroesophageal reflux disease)    ??? Hypertension    ??? Lymphoma ([place])    ??? Neck mass    ??? Prediabetes    ??? VAIN I (vaginal intraepithelial neoplasia grade I)        ROS    Constitutional: No apparent distress noted  General- negative for fever, chills or fatigue  Eyes- negative visual changes  CV- denies chest pain, palpitation  Pul: negative cough or SOB  GI: History of GERD  Urinary:- No dysuria or polyuria   MS-intermittent muscle spasms  Neuro- negative headache, dizziness or weakness  Skin- negative for rashes or lesions.  Psych- denies any anxiety or depression    Objective:   No flowsheet data found.     [INSTRUCTIONS:  "[x] " Indicates a positive item  "[] " Indicates a negative item  -- DELETE ALL ITEMS NOT EXAMINED]    Constitutional: [x]  Appears well-developed and well-nourished [x]  No apparent distress      []  Abnormal -     Mental status: [x]  Alert and awake  [x]  Oriented  to person/place/time [x]  Able to follow commands    []  Abnormal -     Eyes:   EOM    [x]   Normal    []  Abnormal -   Sclera  [x]   Normal    []  Abnormal -          Discharge [x]   None visible   []  Abnormal -     HENT: [x]  Normocephalic, atraumatic  []  Abnormal -   [x]  Mouth/Throat: Mucous membranes are moist    External Ears [x]  Normal  []  Abnormal -    Neck: [x]  No visualized mass []  Abnormal -     Pulmonary/Chest: [x]  Respiratory effort normal   [x]  No visualized signs of difficulty breathing or respiratory distress        []  Abnormal -      Musculoskeletal:   [x]  Normal gait with no signs of ataxia         [x]  Normal range of motion of neck        []  Abnormal -     Neurological:        [x]  No Facial Asymmetry (Cranial nerve 7 motor function) (limited exam due to video visit)          [x]  No gaze palsy        []  Abnormal -          Skin:        [x]  No significant exanthematous lesions or discoloration noted on facial skin         []  Abnormal -            Psychiatric:       [x]  Normal Affect []  Abnormal -        [x]  No Hallucinations    Other pertinent observable physical exam findings:-        We discussed the expected course, resolution and complications of the diagnosis(es) in detail.  Medication risks, benefits, costs, interactions, and alternatives were discussed as indicated.  I advised her to contact the office if her condition worsens, changes or fails to improve as  anticipated. She expressed understanding with the diagnosis(es) and plan.       [name], who was evaluated through a patient-initiated, synchronous (real-time) audio-video encounter, and/or her healthcare decision maker, is aware that it is a billable service, with coverage as determined by her insurance carrier. She provided verbal consent to proceed: Yes, and patient identification was verified. It was conducted pursuant to the emergency declaration under the [place], [place] waiver authority and the [employer] Act. A caregiver was present when appropriate. Ability to conduct physical exam was limited. I was at home. The patient was at home.      Candie Echevaria, NP

## 2019-03-30 NOTE — Progress Notes (Signed)
Melissa Leblanc is a 53 y.o. female who was seen by synchronous (real-time) audio-video technology on 03/30/2019 for Hypertension        Assessment & Plan:   Diagnoses and all orders for this visit:    1. Muscle spasm  -     cyclobenzaprine (Flexeril) 5 mg tablet; Take 1 Tab by mouth three (3) times daily as needed for Muscle Spasm(s).    2. Essential hypertension  -     lisinopril-hydroCHLOROthiazide (PRINZIDE, ZESTORETIC) 20-25 mg per tablet; take 1 tablet by mouth once daily  Indications: high blood pressure  -     potassium chloride (KLOR-CON) 10 mEq tablet; Take 1 Tab by mouth daily. Indications: prevention of low potassium in the blood    3. Gastroesophageal reflux disease, esophagitis presence not specified  -     Omeprazole delayed release (PRILOSEC D/R) 20 mg tablet; Take 1 Tab by mouth daily.            Subjective:     The patient presents for an Audio-visual teleconference appointment for routine follow-up care.  Patient has a prior medical history for hypertension, prediabetes, GERD and intermittent muscle spasms.  Per the patient she has been feeling well.  She denies any chest pain, palpitation, or lower extremity edema.  Per the patient she has worked on lifestyle modification.  She is avoiding eating snack food and eats more vegetables, fruit and salads.  Hypertension-previous blood pressures have been controlled.  She is prescribed Prinzide daily denies any cough.  She is compliant with taking her medication daily.  GERD-Per the patient she takes omeprazole as needed.  Notes her GERD is associated with various types of foods she eats.  Muscle spasms-patient does have a history of intermittent muscle spasms.  Notes that she has been walking twice a day to improve her muscle spasms.      Prior to Admission medications    Medication Sig Start Date End Date Taking? Authorizing Provider   cyclobenzaprine (Flexeril) 5 mg tablet Take 1 Tab by mouth three (3) times daily as needed for Muscle Spasm(s). 03/30/19   Yes Candie Echevaria, NP   lisinopril-hydroCHLOROthiazide (PRINZIDE, ZESTORETIC) 20-25 mg per tablet take 1 tablet by mouth once daily  Indications: high blood pressure 03/30/19  Yes Candie Echevaria, NP   Omeprazole delayed release (PRILOSEC D/R) 20 mg tablet Take 1 Tab by mouth daily. 03/30/19  Yes Candie Echevaria, NP   potassium chloride (KLOR-CON) 10 mEq tablet Take 1 Tab by mouth daily. Indications: prevention of low potassium in the blood 03/30/19  Yes Candie Echevaria, NP   lisinopril-hydroCHLOROthiazide (PRINZIDE, ZESTORETIC) 20-25 mg per tablet take 1 tablet by mouth once daily  Indications: high blood pressure 01/05/19 03/30/19  Candie Echevaria, NP   potassium chloride (KLOR-CON) 10 mEq tablet Take 1 Tab by mouth daily. Indications: prevention of low potassium in the blood 01/05/19 03/30/19  Candie Echevaria, NP   furosemide (LASIX) 20 mg tablet Take 1 Tab by mouth daily as needed for Other (fluid retention). Indications: visible water retention 08/06/18 03/30/19  Candie Echevaria, NP   cyclobenzaprine (FLEXERIL) 5 mg tablet Take 1 Tab by mouth three (3) times daily as needed for Muscle Spasm(s). 08/06/18 03/30/19  Candie Echevaria, NP   Omeprazole delayed release (PRILOSEC D/R) 20 mg tablet Take 1 Tab by mouth daily. 08/06/18 03/30/19  Candie Echevaria, NP     Patient Active Problem List   Diagnosis Code   ???  Prediabetes R73.03   ??? Essential hypertension I10   ??? Urge incontinence N39.41   ??? 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 Back pain M54.9   ??? Advance directive discussed with patient Z71.89   ??? Chronic anemia D64.9   ??? Arthritis M19.90   ??? 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 Pedal edema R60.0   ??? Class 1 obesity due to excess calories with serious comorbidity and body mass index (BMI) of 33.0 to 33.9 in adult E66.09, Z68.33   ??? Severe obesity (HCC) E66.01   ??? Tobacco abuse Z72.0     Patient Active Problem List    Diagnosis Date Noted    ??? Tobacco abuse 01/05/2019   ??? Severe obesity ([place]) 11/28/2017   ??? Pedal edema 04/18/2017   ??? Class 1 obesity due to excess calories with serious comorbidity and body mass index (BMI) of 33.0 to 33.9 in adult 04/18/2017   ??? 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 Chronic anemia 10/17/2015   ??? Arthritis 10/17/2015   ??? Advance directive discussed with patient 08/17/2014   ??? Back pain 11/27/2012   ??? Hodgkin's disease of lymph nodes of head, face, and neck ([place]) 09/15/2012   ??? Urge incontinence 12/27/2011   ??? Prediabetes 06/03/2011   ??? Essential hypertension 06/03/2011     Current Outpatient Medications   Medication Sig Dispense Refill   ??? cyclobenzaprine (Flexeril) 5 mg tablet Take 1 Tab by mouth three (3) times daily as needed for Muscle Spasm(s). 30 Tab 1   ??? lisinopril-hydroCHLOROthiazide (PRINZIDE, ZESTORETIC) 20-25 mg per tablet take 1 tablet by mouth once daily  Indications: high blood pressure 90 Tab 1   ??? Omeprazole delayed release (PRILOSEC D/R) 20 mg tablet Take 1 Tab by mouth daily. 30 Tab 2   ??? potassium chloride (KLOR-CON) 10 mEq tablet Take 1 Tab by mouth daily. Indications: prevention of low potassium in the blood 90 Tab 1     Allergies   Allergen Reactions   ??? Vicodin [Hydrocodone-Acetaminophen] Itching     Past Medical History:   Diagnosis Date   ??? Anxiety    ??? Arthritis 10/17/2015   ??? Asthma    ??? Bladder incontinence    ??? Cancer [facility])     lymph node cancer   ??? Depression 08/17/2014   ??? Diabetes (HCC)     border line diabetes   ??? GERD (gastroesophageal reflux disease)    ??? Hypertension    ??? Lymphoma ([place])    ??? Neck mass    ??? Prediabetes    ??? VAIN I (vaginal intraepithelial neoplasia grade I)        ROS    Constitutional: No apparent distress noted  General- negative for fever, chills or fatigue  Eyes- negative visual changes  CV- denies chest pain, palpitation  Pul: negative cough or SOB  GI: History of GERD  Urinary:- No dysuria or polyuria  MS-intermittent muscle spasms  Neuro- negative headache,  dizziness or weakness  Skin- negative for rashes or lesions.  Psych- denies any anxiety or depression    Objective:   No flowsheet data found.     [INSTRUCTIONS:  "[x] " Indicates a positive item  "[] " Indicates a negative item  -- DELETE ALL ITEMS NOT EXAMINED]    Constitutional: [x]  Appears well-developed and well-nourished [x]  No apparent distress      []  Abnormal -     Mental status: [x]  Alert and awake  [x]  Oriented  to person/place/time [x]  Able to follow commands    []  Abnormal -     Eyes:   EOM    [x]   Normal    []  Abnormal -   Sclera  [x]   Normal    []  Abnormal -          Discharge [x]   None visible   []  Abnormal -     HENT: [x]  Normocephalic, atraumatic  []  Abnormal -   [x]  Mouth/Throat: Mucous membranes are moist    External Ears [x]  Normal  []  Abnormal -    Neck: [x]  No visualized mass []  Abnormal -     Pulmonary/Chest: [x]  Respiratory effort normal   [x]  No visualized signs of difficulty breathing or respiratory distress        []  Abnormal -      Musculoskeletal:   [x]  Normal gait with no signs of ataxia         [x]  Normal range of motion of neck        []  Abnormal -     Neurological:        [x]  No Facial Asymmetry (Cranial nerve 7 motor function) (limited exam due to video visit)          [x]  No gaze palsy        []  Abnormal -          Skin:        [x]  No significant exanthematous lesions or discoloration noted on facial skin         []  Abnormal -            Psychiatric:       [x]  Normal Affect []  Abnormal -        [x]  No Hallucinations    Other pertinent observable physical exam findings:-        We discussed the expected course, resolution and complications of the diagnosis(es) in detail.  Medication risks, benefits, costs, interactions, and alternatives were discussed as indicated.  I advised her to contact the office if her condition worsens, changes or fails to improve as anticipated. She expressed understanding with the diagnosis(es) and plan.       Melissa Leblanc, who was evaluated through a  patient-initiated, synchronous (real-time) audio-video encounter, and/or her healthcare decision maker, is aware that it is a billable service, with coverage as determined by her insurance carrier. She provided verbal consent to proceed: Yes, and patient identification was verified. It was conducted pursuant to the emergency declaration under the [place], [place] waiver authority and the [employer] Act. A caregiver was present when appropriate. Ability to conduct physical exam was limited. I was at home. The patient was at home.      Candie Echevaria, NP

## 2019-04-01 ENCOUNTER — Encounter: Attending: Family | Primary: Family

## 2019-07-02 ENCOUNTER — Encounter: Attending: Family | Primary: Family

## 2019-08-19 ENCOUNTER — Telehealth: Attending: Family | Primary: Family

## 2019-08-19 ENCOUNTER — Telehealth: Admit: 2019-08-19 | Payer: MEDICARE | Attending: Family | Primary: Family

## 2019-08-19 DIAGNOSIS — I10 Essential (primary) hypertension: Secondary | ICD-10-CM

## 2019-08-19 MED ORDER — POTASSIUM CHLORIDE SR 10 MEQ TAB, PARTICLES/CRYSTALS
10 mEq | ORAL_TABLET | Freq: Every day | ORAL | 1 refills | Status: DC
Start: 2019-08-19 — End: 2020-09-26

## 2019-08-19 MED ORDER — LISINOPRIL-HYDROCHLOROTHIAZIDE 20 MG-25 MG TAB
20-25 mg | ORAL_TABLET | ORAL | 1 refills | Status: DC
Start: 2019-08-19 — End: 2020-09-26

## 2019-08-19 MED ORDER — LISINOPRIL-HYDROCHLOROTHIAZIDE 20 MG-25 MG TAB
20-25 mg | ORAL_TABLET | ORAL | 1 refills | Status: DC
Start: 2019-08-19 — End: 2019-08-19

## 2019-08-19 NOTE — Progress Notes (Signed)
Melissa Leblanc is a 53 y.o. female who was seen by synchronous (real-time) audio-video technology on 08/19/2019 for Follow-up and Hypertension    Assessment & Plan:   Diagnoses and all orders for this visit:    1. Essential hypertension  -     CBC WITH AUTOMATED DIFF; Future  -     METABOLIC PANEL, COMPREHENSIVE; Future  -     LIPID PANEL; Future  -     potassium chloride (KLOR-CON) 10 mEq tablet; Take 1 Tab by mouth daily. Indications: prevention of low potassium in the blood  -     lisinopril-hydroCHLOROthiazide (PRINZIDE, ZESTORETIC) 20-25 mg per tablet; take 1 tablet by mouth once daily  Indications: high blood pressure    2. History of lymphoma  -     REFERRAL TO ONCOLOGY    3. Encounter for screening mammogram for malignant neoplasm of breast  -     MAM MAMMO BI SCREENING INCL CAD; Future    4. Prediabetes  -     HEMOGLOBIN A1C WITH EAG; Future    5. Tobacco abuse    6. Skin disorder    Labs per orders.  They will continue with diet and exercise. They will return to the office 1 months for follow-up.    Follow-up in 1 to 2 weeks for face-to-face visit for evaluation of axilla "knot"  Subjective:     SUBJECTIVE    Patient presents for follow up on their hypertension.  We reviewed their cardiac risk factors which include obesity .  They have  continued to work on diet and exercise but admits she has already drinking sodas again.  The patient has been taking medications as prescribed regularly and denies any side effects with the medication.  The patient denies any chest pain or chest tightness.  Denies any dyspnea upon exertion.  The patient denies any headaches, blurred vision, or double vision.   The patient was counseled on the dangers of tobacco use, and was advised to quit.  Reviewed strategies to maximize success, including removing cigarettes and smoking materials from environment.   Patient notes she has "knot" under her arm that is not visible to the eye.  She notes she has a history of lymphoma and used to be a patient of Dr. Nadyne Coombes who has retired.  She is requesting referral to a new referral.  She notes that the "knot" appeared for 1 week and resolved itself but has returned.  She denies any drainage and notes is mildly tender to palpation she notes that the "knot" has a darker pigmentation  Prior to Admission medications    Medication Sig Start Date End Date Taking? Authorizing Provider   potassium chloride (KLOR-CON) 10 mEq tablet Take 1 Tab by mouth daily. Indications: prevention of low potassium in the blood 08/19/19  Yes Candie Echevaria, NP   lisinopril-hydroCHLOROthiazide (PRINZIDE, ZESTORETIC) 20-25 mg per tablet take 1 tablet by mouth once daily  Indications: high blood pressure 08/19/19  Yes Candie Echevaria, NP   cyclobenzaprine (Flexeril) 5 mg tablet Take 1 Tab by mouth three (3) times daily as needed for Muscle Spasm(s). 03/30/19  Yes Candie Echevaria, NP   Omeprazole delayed release (PRILOSEC D/R) 20 mg tablet Take 1 Tab by mouth daily. 03/30/19  Yes Candie Echevaria, NP   lisinopril-hydroCHLOROthiazide Rodell Perna, ZESTORETIC) 20-25 mg per tablet take 1 tablet by mouth once daily  Indications: high blood pressure 08/19/19 08/19/19  Candie Echevaria, NP   lisinopril-hydroCHLOROthiazide (PRINZIDE, ZESTORETIC)  20-25 mg per tablet take 1 tablet by mouth once daily  Indications: high blood pressure 03/30/19 08/19/19  Candie Echevaria, NP   potassium chloride (KLOR-CON) 10 mEq tablet Take 1 Tab by mouth daily. Indications: prevention of low potassium in the blood 03/30/19 08/19/19  Candie Echevaria, NP     Patient Active Problem List   Diagnosis Code   ??? Prediabetes R73.03   ??? Essential hypertension I10   ??? Urge incontinence N39.41   ??? 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 Back pain M54.9    ??? Advance directive discussed with patient Z71.89   ??? Chronic anemia D64.9   ??? Arthritis M19.90   ??? 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 Pedal edema R60.0   ??? Class 1 obesity due to excess calories with serious comorbidity and body mass index (BMI) of 33.0 to 33.9 in adult E66.09, Z68.33   ??? Severe obesity (HCC) E66.01   ??? Tobacco abuse Z72.0     Patient Active Problem List    Diagnosis Date Noted   ??? Tobacco abuse 01/05/2019   ??? Severe obesity ([place]) 11/28/2017   ??? Pedal edema 04/18/2017   ??? Class 1 obesity due to excess calories with serious comorbidity and body mass index (BMI) of 33.0 to 33.9 in adult 04/18/2017   ??? 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 Chronic anemia 10/17/2015   ??? Arthritis 10/17/2015   ??? Advance directive discussed with patient 08/17/2014   ??? Back pain 11/27/2012   ??? Hodgkin's disease of lymph nodes of head, face, and neck ([place]) 09/15/2012   ??? Urge incontinence 12/27/2011   ??? Prediabetes 06/03/2011   ??? Essential hypertension 06/03/2011     Current Outpatient Medications   Medication Sig Dispense Refill   ??? potassium chloride (KLOR-CON) 10 mEq tablet Take 1 Tab by mouth daily. Indications: prevention of low potassium in the blood 90 Tab 1   ??? lisinopril-hydroCHLOROthiazide (PRINZIDE, ZESTORETIC) 20-25 mg per tablet take 1 tablet by mouth once daily  Indications: high blood pressure 90 Tab 1   ??? cyclobenzaprine (Flexeril) 5 mg tablet Take 1 Tab by mouth three (3) times daily as needed for Muscle Spasm(s). 30 Tab 1   ??? Omeprazole delayed release (PRILOSEC D/R) 20 mg tablet Take 1 Tab by mouth daily. 30 Tab 2     Allergies   Allergen Reactions   ??? Vicodin [Hydrocodone-Acetaminophen] Itching     Past Medical History:   Diagnosis Date   ??? Anxiety    ??? Arthritis 10/17/2015   ??? Asthma    ??? Bladder incontinence    ??? Cancer [facility])     lymph node cancer   ??? Depression 08/17/2014   ??? Diabetes (HCC)     border line diabetes   ??? GERD (gastroesophageal reflux disease)    ??? Hypertension    ??? Lymphoma ([place])     ??? Neck mass    ??? Prediabetes    ??? VAIN I (vaginal intraepithelial neoplasia grade I)        ROS    ROS:  History obtained from the patient intake forms which are reviewed with the patient  ?? General: negative for - chills, fever, weight changes or malaise  ?? HEENT: no sore throat, nasal congestion, vision problems or ear problems  ?? Respiratory: no cough, shortness of breath, or wheezing  ?? Cardiovascular: no chest pain, palpitations, or dyspnea on exertion  ?? Gastrointestinal: no abdominal pain,  N/V, change in bowel habits, or black or bloody stools  ?? Musculoskeletal: no back pain, joint pain, joint stiffness, muscle pain or muscle weakness  ?? Neurological: no numbness, tingling, headache or dizziness  ?? Endo:  No polyuria or polydipsia.   ?? GU: no hematuria, dysuria, frequency, hesitancy, or nocturia.    ?? Psychological: negative for - anxiety, depression, sleep disturbances, suicidal or homicidal ideations    Objective:   No flowsheet data found.     [INSTRUCTIONS:  "[x] " Indicates a positive item  "[] " Indicates a negative item  -- DELETE ALL ITEMS NOT EXAMINED]    Constitutional: [x]  Appears well-developed and well-nourished [x]  No apparent distress      []  Abnormal -     Mental status: [x]  Alert and awake  [x]  Oriented to person/place/time [x]  Able to follow commands    []  Abnormal -     Eyes:   EOM    [x]   Normal    []  Abnormal -   Sclera  [x]   Normal    []  Abnormal -          Discharge [x]   None visible   []  Abnormal -     HENT: [x]  Normocephalic, atraumatic  []  Abnormal -   [x]  Mouth/Throat: Mucous membranes are moist    External Ears [x]  Normal  []  Abnormal -    Neck: [x]  No visualized mass []  Abnormal -     Pulmonary/Chest: [x]  Respiratory effort normal   [x]  No visualized signs of difficulty breathing or respiratory distress        []  Abnormal -      Musculoskeletal:   [x]  Normal gait with no signs of ataxia         [x]  Normal range of motion of neck        []  Abnormal -      Neurological:        [x]  No Facial Asymmetry (Cranial nerve 7 motor function) (limited exam due to video visit)          [x]  No gaze palsy        []  Abnormal -          Skin:        [x]  No significant exanthematous lesions or discoloration noted on facial skin         []  Abnormal -            Psychiatric:       [x]  Normal Affect []  Abnormal -        [x]  No Hallucinations    Other pertinent observable physical exam findings:-        We discussed the expected course, resolution and complications of the diagnosis(es) in detail.  Medication risks, benefits, costs, interactions, and alternatives were discussed as indicated.  I advised her to contact the office if her condition worsens, changes or fails to improve as anticipated. She expressed understanding with the diagnosis(es) and plan.       [name], who was evaluated through a patient-initiated, synchronous (real-time) audio-video encounter, and/or her healthcare decision maker, is aware that it is a billable service, with coverage as determined by her insurance carrier. She provided verbal consent to proceed: Yes, and patient identification was verified. It was conducted pursuant to the emergency declaration under the [place], [place] waiver authority and the [employer] Act. A caregiver was present when appropriate. Ability to conduct physical exam was  limited. I was at home. The patient was at home.      Candie Echevaria, NP

## 2019-08-19 NOTE — Progress Notes (Signed)
Melissa Leblanc is a 53 y.o. female who was seen by synchronous (real-time) audio-video technology on 08/19/2019 for Follow-up and Hypertension    Assessment & Plan:   Diagnoses and all orders for this visit:    1. Essential hypertension  -     CBC WITH AUTOMATED DIFF; Future  -     METABOLIC PANEL, COMPREHENSIVE; Future  -     LIPID PANEL; Future  -     potassium chloride (KLOR-CON) 10 mEq tablet; Take 1 Tab by mouth daily. Indications: prevention of low potassium in the blood  -     lisinopril-hydroCHLOROthiazide (PRINZIDE, ZESTORETIC) 20-25 mg per tablet; take 1 tablet by mouth once daily  Indications: high blood pressure    2. History of lymphoma  -     REFERRAL TO ONCOLOGY    3. Encounter for screening mammogram for malignant neoplasm of breast  -     MAM MAMMO BI SCREENING INCL CAD; Future    4. Prediabetes  -     HEMOGLOBIN A1C WITH EAG; Future    5. Tobacco abuse    6. Skin disorder    Labs per orders.  They will continue with diet and exercise. They will return to the office 1 months for follow-up.    Follow-up in 1 to 2 weeks for face-to-face visit for evaluation of axilla "knot"  Subjective:     SUBJECTIVE    Patient presents for follow up on their hypertension.  We reviewed their cardiac risk factors which include obesity .  They have  continued to work on diet and exercise but admits she has already drinking sodas again.  The patient has been taking medications as prescribed regularly and denies any side effects with the medication.  The patient denies any chest pain or chest tightness.  Denies any dyspnea upon exertion.  The patient denies any headaches, blurred vision, or double vision.   The patient was counseled on the dangers of tobacco use, and was advised to quit.  Reviewed strategies to maximize success, including removing cigarettes and smoking materials from environment.  Patient notes she has "knot" under her arm that is not visible to the eye.  She notes she has a history of lymphoma and used to  be a patient of Dr. Nadyne Coombes who has retired.  She is requesting referral to a new referral.  She notes that the "knot" appeared for 1 week and resolved itself but has returned.  She denies any drainage and notes is mildly tender to palpation she notes that the "knot" has a darker pigmentation  Prior to Admission medications    Medication Sig Start Date End Date Taking? Authorizing Provider   potassium chloride (KLOR-CON) 10 mEq tablet Take 1 Tab by mouth daily. Indications: prevention of low potassium in the blood 08/19/19  Yes Candie Echevaria, NP   lisinopril-hydroCHLOROthiazide (PRINZIDE, ZESTORETIC) 20-25 mg per tablet take 1 tablet by mouth once daily  Indications: high blood pressure 08/19/19  Yes Candie Echevaria, NP   cyclobenzaprine (Flexeril) 5 mg tablet Take 1 Tab by mouth three (3) times daily as needed for Muscle Spasm(s). 03/30/19  Yes Candie Echevaria, NP   Omeprazole delayed release (PRILOSEC D/R) 20 mg tablet Take 1 Tab by mouth daily. 03/30/19  Yes Candie Echevaria, NP   lisinopril-hydroCHLOROthiazide Rodell Perna, ZESTORETIC) 20-25 mg per tablet take 1 tablet by mouth once daily  Indications: high blood pressure 08/19/19 08/19/19  Candie Echevaria, NP   lisinopril-hydroCHLOROthiazide (PRINZIDE, ZESTORETIC)  20-25 mg per tablet take 1 tablet by mouth once daily  Indications: high blood pressure 03/30/19 08/19/19  Candie Echevaria, NP   potassium chloride (KLOR-CON) 10 mEq tablet Take 1 Tab by mouth daily. Indications: prevention of low potassium in the blood 03/30/19 08/19/19  Candie Echevaria, NP     Patient Active Problem List   Diagnosis Code   ??? Prediabetes R73.03   ??? Essential hypertension I10   ??? Urge incontinence N39.41   ??? 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 Back pain M54.9   ??? Advance directive discussed with patient Z71.89   ??? Chronic anemia D64.9   ??? Arthritis M19.90   ??? 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 Pedal edema R60.0   ???  Class 1 obesity due to excess calories with serious comorbidity and body mass index (BMI) of 33.0 to 33.9 in adult E66.09, Z68.33   ??? Severe obesity (HCC) E66.01   ??? Tobacco abuse Z72.0     Patient Active Problem List    Diagnosis Date Noted   ??? Tobacco abuse 01/05/2019   ??? Severe obesity ([place]) 11/28/2017   ??? Pedal edema 04/18/2017   ??? Class 1 obesity due to excess calories with serious comorbidity and body mass index (BMI) of 33.0 to 33.9 in adult 04/18/2017   ??? 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 Chronic anemia 10/17/2015   ??? Arthritis 10/17/2015   ??? Advance directive discussed with patient 08/17/2014   ??? Back pain 11/27/2012   ??? 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 Urge incontinence 12/27/2011   ??? Prediabetes 06/03/2011   ??? Essential hypertension 06/03/2011     Current Outpatient Medications   Medication Sig Dispense Refill   ??? potassium chloride (KLOR-CON) 10 mEq tablet Take 1 Tab by mouth daily. Indications: prevention of low potassium in the blood 90 Tab 1   ??? lisinopril-hydroCHLOROthiazide (PRINZIDE, ZESTORETIC) 20-25 mg per tablet take 1 tablet by mouth once daily  Indications: high blood pressure 90 Tab 1   ??? cyclobenzaprine (Flexeril) 5 mg tablet Take 1 Tab by mouth three (3) times daily as needed for Muscle Spasm(s). 30 Tab 1   ??? Omeprazole delayed release (PRILOSEC D/R) 20 mg tablet Take 1 Tab by mouth daily. 30 Tab 2     Allergies   Allergen Reactions   ??? Vicodin [Hydrocodone-Acetaminophen] Itching     Past Medical History:   Diagnosis Date   ??? Anxiety    ??? Arthritis 10/17/2015   ??? Asthma    ??? Bladder incontinence    ??? Cancer [facility])     lymph node cancer   ??? Depression 08/17/2014   ??? Diabetes (HCC)     border line diabetes   ??? GERD (gastroesophageal reflux disease)    ??? Hypertension    ??? Lymphoma ([place])    ??? Neck mass    ??? Prediabetes    ??? VAIN I (vaginal intraepithelial neoplasia grade I)        ROS    ROS:  History obtained from the patient intake forms which are  reviewed with the patient  ?? General: negative for - chills, fever, weight changes or malaise  ?? HEENT: no sore throat, nasal congestion, vision problems or ear problems  ?? Respiratory: no cough, shortness of breath, or wheezing  ?? Cardiovascular: no chest pain, palpitations, or dyspnea on exertion  ?? Gastrointestinal: no abdominal pain,  N/V, change in bowel habits, or black or bloody stools  ?? Musculoskeletal: no back pain, joint pain, joint stiffness, muscle pain or muscle weakness  ?? Neurological: no numbness, tingling, headache or dizziness  ?? Endo:  No polyuria or polydipsia.   ?? GU: no hematuria, dysuria, frequency, hesitancy, or nocturia.    ?? Psychological: negative for - anxiety, depression, sleep disturbances, suicidal or homicidal ideations    Objective:   No flowsheet data found.     [INSTRUCTIONS:  "[x] " Indicates a positive item  "[] " Indicates a negative item  -- DELETE ALL ITEMS NOT EXAMINED]    Constitutional: [x]  Appears well-developed and well-nourished [x]  No apparent distress      []  Abnormal -     Mental status: [x]  Alert and awake  [x]  Oriented to person/place/time [x]  Able to follow commands    []  Abnormal -     Eyes:   EOM    [x]   Normal    []  Abnormal -   Sclera  [x]   Normal    []  Abnormal -          Discharge [x]   None visible   []  Abnormal -     HENT: [x]  Normocephalic, atraumatic  []  Abnormal -   [x]  Mouth/Throat: Mucous membranes are moist    External Ears [x]  Normal  []  Abnormal -    Neck: [x]  No visualized mass []  Abnormal -     Pulmonary/Chest: [x]  Respiratory effort normal   [x]  No visualized signs of difficulty breathing or respiratory distress        []  Abnormal -      Musculoskeletal:   [x]  Normal gait with no signs of ataxia         [x]  Normal range of motion of neck        []  Abnormal -     Neurological:        [x]  No Facial Asymmetry (Cranial nerve 7 motor function) (limited exam due to video visit)          [x]  No gaze palsy        []  Abnormal -          Skin:        [x]  No  significant exanthematous lesions or discoloration noted on facial skin         []  Abnormal -            Psychiatric:       [x]  Normal Affect []  Abnormal -        [x]  No Hallucinations    Other pertinent observable physical exam findings:-        We discussed the expected course, resolution and complications of the diagnosis(es) in detail.  Medication risks, benefits, costs, interactions, and alternatives were discussed as indicated.  I advised her to contact the office if her condition worsens, changes or fails to improve as anticipated. She expressed understanding with the diagnosis(es) and plan.       Melissa Leblanc, who was evaluated through a patient-initiated, synchronous (real-time) audio-video encounter, and/or her healthcare decision maker, is aware that it is a billable service, with coverage as determined by her insurance carrier. She provided verbal consent to proceed: Yes, and patient identification was verified. It was conducted pursuant to the emergency declaration under the [place], [place] waiver authority and the [employer] Act. A caregiver was present when appropriate. Ability to conduct physical exam was  limited. I was at home. The patient was at home.      Candie Echevaria, NP

## 2019-09-20 ENCOUNTER — Encounter

## 2019-09-20 ENCOUNTER — Inpatient Hospital Stay: Admit: 2019-09-20 | Payer: MEDICARE | Attending: Family | Primary: Family

## 2019-09-20 DIAGNOSIS — Z1231 Encounter for screening mammogram for malignant neoplasm of breast: Secondary | ICD-10-CM

## 2019-09-22 NOTE — Telephone Encounter (Signed)
Patient called asking to speak with Santiago Glad about an issue they discuss on last visit.  Please call her back at [phone]

## 2019-09-29 NOTE — Telephone Encounter (Signed)
Telephone call to the patient regarding message left.  Patient is inquiring about the  Covid 19 vaccine.  All questions answered.

## 2019-10-25 ENCOUNTER — Ambulatory Visit: Payer: MEDICAID | Attending: Internal Medicine | Primary: Family

## 2019-11-25 ENCOUNTER — Ambulatory Visit: Attending: Internal Medicine | Primary: Family

## 2019-11-25 ENCOUNTER — Ambulatory Visit: Admit: 2019-11-25 | Discharge: 2019-11-25 | Payer: MEDICARE | Attending: Internal Medicine | Primary: Family

## 2019-11-25 ENCOUNTER — Inpatient Hospital Stay: Admit: 2019-11-25 | Payer: MEDICARE | Primary: Family

## 2019-11-25 DIAGNOSIS — C8191 Hodgkin lymphoma, unspecified, lymph nodes of head, face, and neck: Secondary | ICD-10-CM

## 2019-11-25 LAB — COMPREHENSIVE METABOLIC PANEL
ALT: 20 U/L (ref 13–56)
AST: 12 U/L (ref 10–38)
Albumin/Globulin Ratio: 0.9 (ref 0.8–1.7)
Albumin: 3.7 g/dL (ref 3.4–5.0)
Alkaline Phosphatase: 71 U/L (ref 45–117)
Anion Gap: 5 mmol/L (ref 3.0–18)
BUN: 13 MG/DL (ref 7.0–18)
Bun/Cre Ratio: 19 (ref 12–20)
CO2: 25 mmol/L (ref 21–32)
Calcium: 8.9 MG/DL (ref 8.5–10.1)
Chloride: 111 mmol/L (ref 100–111)
Creatinine: 0.69 MG/DL (ref 0.6–1.3)
EGFR IF NonAfrican American: >60 mL/min/{1.73_m2} (ref >60)
GFR African American: >60 mL/min/{1.73_m2} (ref >60)
Globulin: 3.9 g/dL (ref 2.0–4.0)
Glucose: 93 mg/dL (ref 74–99)
Potassium: 4.4 mmol/L (ref 3.5–5.5)
Sodium: 141 mmol/L (ref 136–145)
Total Bilirubin: 0.3 MG/DL (ref 0.2–1.0)
Total Protein: 7.6 g/dL (ref 6.4–8.2)

## 2019-11-25 LAB — CBC WITH AUTO DIFFERENTIAL
Basophils %: 0 % (ref 0–2)
Basophils Absolute: 0 10*3/uL (ref 0.0–0.1)
Eosinophils %: 1 % (ref 0–5)
Eosinophils Absolute: 0 10*3/uL (ref 0.0–0.4)
Hematocrit: 37.2 % (ref 35.0–45.0)
Hemoglobin: 11.9 g/dL — ABNORMAL LOW (ref 12.0–16.0)
Lymphocytes %: 36 % (ref 21–52)
Lymphocytes Absolute: 1.9 10*3/uL (ref 0.9–3.6)
MCH: 25.4 PG (ref 24.0–34.0)
MCHC: 32 g/dL (ref 31.0–37.0)
MCV: 79.3 FL (ref 74.0–97.0)
MPV: 10.8 FL (ref 9.2–11.8)
Monocytes %: 10 % (ref 3–10)
Monocytes Absolute: 0.5 10*3/uL (ref 0.05–1.2)
Neutrophils %: 53 % (ref 40–73)
Neutrophils Absolute: 2.9 10*3/uL (ref 1.8–8.0)
Platelets: 233 10*3/uL (ref 135–420)
RBC: 4.69 M/uL (ref 4.20–5.30)
RDW: 17.4 % — ABNORMAL HIGH (ref 11.6–14.5)
WBC: 5.3 10*3/uL (ref 4.6–13.2)

## 2019-11-25 LAB — SEDIMENTATION RATE: Sed Rate: 20 mm/hr (ref 0–30)

## 2019-11-25 LAB — IRON AND TIBC
Iron Saturation: 16 % — ABNORMAL LOW (ref 20–50)
Iron: 48 ug/dL — ABNORMAL LOW (ref 50–175)
TIBC: 306 ug/dL (ref 250–450)

## 2019-11-25 LAB — FERRITIN
Ferritin: 69 NG/ML (ref 8–388)
Ferritin: 69 NG/ML (ref 8–388)

## 2019-11-25 LAB — METABOLIC PANEL, COMPREHENSIVE
A-G Ratio: 0.9 (ref 0.8–1.7)
ALT (SGPT): 20 U/L (ref 13–56)
AST (SGOT): 12 U/L (ref 10–38)
Albumin: 3.7 g/dL (ref 3.4–5.0)
Alk. phosphatase: 71 U/L (ref 45–117)
Anion gap: 5 mmol/L (ref 3.0–18)
BUN/Creatinine ratio: 19 (ref 12–20)
BUN: 13 MG/DL (ref 7.0–18)
Bilirubin, total: 0.3 MG/DL (ref 0.2–1.0)
CO2: 25 mmol/L (ref 21–32)
Calcium: 8.9 MG/DL (ref 8.5–10.1)
Chloride: 111 mmol/L (ref 100–111)
Creatinine: 0.69 MG/DL (ref 0.6–1.3)
GFR est AA: >60 mL/min/{1.73_m2} (ref >60)
GFR est non-AA: >60 mL/min/{1.73_m2} (ref >60)
Globulin: 3.9 g/dL (ref 2.0–4.0)
Glucose: 93 mg/dL (ref 74–99)
Potassium: 4.4 mmol/L (ref 3.5–5.5)
Protein, total: 7.6 g/dL (ref 6.4–8.2)
Sodium: 141 mmol/L (ref 136–145)

## 2019-11-25 LAB — CBC WITH AUTOMATED DIFF
ABS. BASOPHILS: 0 10*3/uL (ref 0.0–0.1)
ABS. EOSINOPHILS: 0 10*3/uL (ref 0.0–0.4)
ABS. LYMPHOCYTES: 1.9 10*3/uL (ref 0.9–3.6)
ABS. MONOCYTES: 0.5 10*3/uL (ref 0.05–1.2)
ABS. NEUTROPHILS: 2.9 10*3/uL (ref 1.8–8.0)
BASOPHILS: 0 % (ref 0–2)
EOSINOPHILS: 1 % (ref 0–5)
HCT: 37.2 % (ref 35.0–45.0)
HGB: 11.9 g/dL — ABNORMAL LOW (ref 12.0–16.0)
LYMPHOCYTES: 36 % (ref 21–52)
MCH: 25.4 PG (ref 24.0–34.0)
MCHC: 32 g/dL (ref 31.0–37.0)
MCV: 79.3 FL (ref 74.0–97.0)
MONOCYTES: 10 % (ref 3–10)
MPV: 10.8 FL (ref 9.2–11.8)
NEUTROPHILS: 53 % (ref 40–73)
PLATELET: 233 10*3/uL (ref 135–420)
RBC: 4.69 M/uL (ref 4.20–5.30)
RDW: 17.4 % — ABNORMAL HIGH (ref 11.6–14.5)
WBC: 5.3 10*3/uL (ref 4.6–13.2)

## 2019-11-25 LAB — IRON PROFILE
Iron % saturation: 16 % — ABNORMAL LOW (ref 20–50)
Iron: 48 ug/dL — ABNORMAL LOW (ref 50–175)
TIBC: 306 ug/dL (ref 250–450)

## 2019-11-25 LAB — SED RATE (ESR): Sed rate, automated: 20 mm/hr (ref 0–30)

## 2019-11-25 NOTE — Progress Notes (Signed)
Hematology/Oncology  Progress Note    Name: Melissa Leblanc  Date: 11/25/2019  DOB: 10-Feb-1966    SEG:BTDVV M. Starleen Blue, Wadie Mattie     Melissa Leblanc is a 54 year old female who was seen for management of her Hodgkin's lymphoma.    Current therapy: active surveillance, the patient previously completed 8 cycles of systemic chemotherapy with the ABVD regimen (2014)    Subjective:     Melissa Leblanc is a 54 year old African American woman has a history of Hodgkin's lymphoma. She has previously completed a full course of systemic chemotherapy with a combination of ABVD,  In 2014. Patient was being followed by Dr. Nadyne Coombes who retired, last seen was 07/14/2018.  The patient today reported doing great.  She denied any concerns or complaints at this time.  She follow-ups with her primary care physician for smoking cessation. Denied fever, chills, night sweat, unintentional weight loss, skin lumps or bumps, acute bleeding or bruising issues. No acute bleeding, blood in stool, dark stool, melena, hematochezia, hemoptysis, dark urine, or easily bruising. Denied headache, acute vision change, dizziness, chest pain, worsen shortness of breath, palpitation, productive cough, nausea, vomiting, abdominal pain, altered bowel habits, dysuria, new bone pain or back pain, focal numbness or weakness.     Past medical history, family history, and social history: these were reviewed and remains unchanged.    Past Medical History:   Diagnosis Date   ??? Anxiety    ??? Arthritis 10/17/2015   ??? Asthma    ??? Bladder incontinence    ??? Cancer  [facility])     lymph node cancer   ??? Depression 08/17/2014   ??? Diabetes (HCC)     border line diabetes   ??? GERD (gastroesophageal reflux disease)    ??? Hypertension    ??? Lymphoma ([place])    ??? Neck mass    ??? Prediabetes    ??? VAIN I (vaginal intraepithelial neoplasia grade I)      Past Surgical History:   Procedure Laterality Date   ??? HX GYN      BTL   ??? HX HYSTERECTOMY  2004   ??? HX OTHER SURGICAL      lymph node biopsies neck   ??? HX VASCULAR  ACCESS      double right chest mediport   ??? PR RMVL TUN CTR VAD W/SUBQ PORT/PMP CTR/PRPH INSJ N/A 03/14/2016    Dr. Jimmye Norman     Social History     Socioeconomic History   ??? Marital status: SINGLE     Spouse name: Not on file   ??? Number of children: Not on file   ??? Years of education: Not on file   ??? Highest education level: Not on file   Occupational History   ??? Not on file   Social Needs   ??? Financial resource strain: Not on file   ??? Food insecurity     Worry: Not on file     Inability: Not on file   ??? Transportation needs     Medical: Not on file     Non-medical: Not on file   Tobacco Use   ??? Smoking status: Current Every Day Smoker     Packs/day: 1.00     Years: 28.00     Pack years: 28.00   ??? Smokeless tobacco: Never Used   Substance and Sexual Activity   ??? Alcohol use: Not Currently   ??? Drug use: Yes     Types: Marijuana     Comment: one  or 2 puffs occasionally (none now as of 03/11/16 per patient)   ??? Sexual activity: Never   Lifestyle   ??? Physical activity     Days per week: Not on file     Minutes per session: Not on file   ??? Stress: Not on file   Relationships   ??? Social Product manager on phone: Not on file     Gets together: Not on file     Attends religious service: Not on file     Active member of club or organization: Not on file     Attends meetings of clubs or organizations: Not on file     Relationship status: Not on file   ??? Intimate partner violence     Fear of current or ex partner: Not on file     Emotionally abused: Not on file     Physically abused: Not on file     Forced sexual activity: Not on file   Other Topics Concern   ??? Not on file   Social History Narrative   ??? Not on file     Family History   Problem Relation Age of Onset   ??? Cancer Mother         throat   ??? Breast Cancer Mother    ??? Colon Cancer Mother    ??? Hypertension Mother    ??? Colon Cancer Father    ??? Pacemaker Father    ??? Cancer Sister         thigh that moved to lung, sounds like myosarcoma?   ??? Hypertension Sister    ???  Alcohol abuse Neg Hx    ??? Arthritis-rheumatoid Neg Hx    ??? Asthma Neg Hx    ??? Bleeding Prob Neg Hx    ??? Diabetes Neg Hx    ??? Elevated Lipids Neg Hx    ??? Headache Neg Hx    ??? Lung Disease Neg Hx    ??? Migraines Neg Hx    ??? Psychiatric Disorder Neg Hx    ??? Stroke Neg Hx    ??? Mental Retardation Neg Hx      Current Outpatient Medications   Medication Sig Dispense Refill   ??? potassium chloride (KLOR-CON) 10 mEq tablet Take 1 Tab by mouth daily. Indications: prevention of low potassium in the blood 90 Tab 1   ??? lisinopril-hydroCHLOROthiazide (PRINZIDE, ZESTORETIC) 20-25 mg per tablet take 1 tablet by mouth once daily  Indications: high blood pressure 90 Tab 1   ??? cyclobenzaprine (Flexeril) 5 mg tablet Take 1 Tab by mouth three (3) times daily as needed for Muscle Spasm(s). 30 Tab 1   ??? Omeprazole delayed release (PRILOSEC D/R) 20 mg tablet Take 1 Tab by mouth daily. 30 Tab 2     Review of Systems   Constitutional: Negative for chills, diaphoresis, fever, malaise/fatigue and weight loss.   Respiratory: Negative for cough, hemoptysis, shortness of breath and wheezing.    Cardiovascular: Negative for chest pain, palpitations and leg swelling.   Gastrointestinal: Negative for abdominal pain, diarrhea, heartburn, nausea and vomiting.   Genitourinary: Negative for dysuria, frequency, hematuria and urgency.   Musculoskeletal: Negative for joint pain and myalgias.   Skin: Negative for itching and rash.   Neurological: Negative for dizziness, seizures, weakness and headaches.   Psychiatric/Behavioral: Negative for depression. The patient does not have insomnia.             Objective:  Visit Vitals  BP 122/78 (BP 1 Location: Left upper arm, BP Patient Position: Sitting, BP Cuff Size: Adult)   Pulse 63   Temp 97.9 ??F (36.6 ??C) (Temporal)   Resp 18   Ht 5' 5" (1.651 m)   Wt 96.2 kg (212 lb)   LMP  (LMP Unknown)   SpO2 98%   BMI 35.28 kg/m??       ECOG Performance Status (grade): 0  0 - able to carry on all pre-disease activity w/out  restriction  1 - restricted but able to carry out light work  2 - ambulatory and can self- care but unable to carry out work  3 - bed or chair >50% of waking hours  4 - completely disable, total care, confined to bed or chair    Physical Exam  Constitutional:       Appearance: Normal appearance.   HENT:      Head: Normocephalic and atraumatic.   Eyes:      Pupils: Pupils are equal, round, and reactive to light.   Neck:      Musculoskeletal: Neck supple.   Cardiovascular:      Rate and Rhythm: Normal rate and regular rhythm.      Heart sounds: Normal heart sounds.   Pulmonary:      Effort: Pulmonary effort is normal.      Breath sounds: Normal breath sounds.   Abdominal:      General: Bowel sounds are normal.      Palpations: Abdomen is soft.      Tenderness: There is no abdominal tenderness. There is no guarding.   Musculoskeletal: Normal range of motion.      Right lower leg: No edema.      Left lower leg: No edema.   Skin:     General: Skin is warm.   Neurological:      General: No focal deficit present.      Mental Status: She is alert and oriented to person, place, and time. Mental status is at baseline.          Diagnostics:      No results found for this or any previous visit (from the past 96 hour(s)).    Imaging:  Results for orders placed during the hospital encounter of 09/24/12   IR BX BONE MARROW    Narrative PREOPERATIVE DIAGNOSIS: Non-Hodgkin's lymphoma.    POSTOPERATIVE DIAGNOSIS: Same    ATTENDING: Dr. Lois Huxley, M.D.    ASSISTANT: None.    PROCEDURES: Fluoroscopically guided bone marrow biopsy.    ANESTHESIA: Local 1% lidocaine as well as moderate intravenous sedation with  Versed and fentanyl given and monitored per independently trained  interventional radiology nurse under my direct supervision for 30 minutes.  Please see nursing records for detailed medication dosing and timing of the  procedure.    CONTRAST: None.    COMPLICATIONS: None    DRAIN: No    CATHETER: None.    EBL: Minimal.     SPECIMEN: 2 aspirates and single core biopsy was obtained.    Fluoroscopy time: 63 seconds.    TECHNIQUE: After detailed explanation of risks and benefits of the procedure  verbal and written consent were obtained. Patient was brought to the  interventional radiology room and placed prone on the table. Timeout was  performed. Scout view was obtained. Target lesion was identified and skin was  marked overlying left iliac crest. Left iliac crest region was prepped and  draped in the usual  sterile fashion. 1% lidocaine solution was instilled in the  skin superficial and deep subcutaneous soft tissues overlying the biopsy  region.    Under direct fluoroscopy guidance using 11-gauge OnControl biopsy needle was  advanced down through left iliac crest periosteum. Single pass was made. 2  aspirates and single core biopsy were obtained. Given to pathology on site.    FINDINGS: Fluoroscopic guidance demonstrated good position of the biopsy needle.      Impression IMPRESSION:    Successful, uncomplicated fluoroscopically guided bone marrow biopsy.         Results for orders placed during the hospital encounter of 03/11/16   XR CHEST PA LAT    Narrative Chest PA and lateral    INDICATION: Postoperative    COMPARISON: Plain radiograph 11/28/2014, CT 06/26/2015    FINDINGS:    Two views of the chest were obtained. Right-sided double lumen central venous  catheter is unchanged. Cardiac silhouette and pulmonary vascularity are within  normal limits. No confluent consolidation. No acute osseous abnormality.      Impression IMPRESSION:    No pneumothorax. No confluent consolidation.         Results for orders placed during the hospital encounter of 07/25/18   CT CHEST ABD PELV W CONT    Narrative CT CHEST ABDOMEN AND PELVIS WITH CONTRAST        COMPARISON:  January 23, 2017 .    INDICATIONS:    Lymphoma.    Following the uneventful administration of  oral contrast and  100 cc of Isovue  300 scanning of the chest, abdomen and pelvis is  performed with a multislice  scanner. Coronal sagittal and axial reconstructions were created from the 3 D  data set.    FINDINGS:     CT CHEST FINDINGS:    Thyroid/Base Of Neck: There is generalized thyroid enlargement without focal  mass.    Lungs:   Clear.      .  Pleural Spaces:  Unremarkable.       Chest Wall:   No breast mass is evident.  No axillary lymphadenopathy is evident.       Mediastinum, Hila, Great Vessels, Heart:  A punctate calcification is again noted within the right internal jugular vein  just above the innominate and the second within the SVC just above the right  atrium     .    CT ABDOMEN FINDINGS:     Liver/Biliary: Normal.    Spleen: Normal.    Adrenal Glands: Normal.    Kidneys: Normal.    Pancreas: Normal.    Stomach, Small Bowel, appendix, and Colon: Normal.    There is no significant adenopathy.    The abdominal aorta is unremarkable. The IVC is unremarkable.    Peritoneal Spaces: There is no free fluid or free air.    Abdominal Wall: No hernia or mass is evident.    Bladder: Unremarkable.    The uterus is nearly absent. The ovaries are normal.        Osseous Structures Of The Chest, Abdomen And Pelvis: Unremarkable for age.      Impression IMPRESSION:     Negative for the findings of recurrent lymphoma.  Punctate calcifications in the right internal jugular and SVC suggests residual  from Mediport. This is unchanged from prior studies    .    All CT scans at this facility are performed using dose optimization technique as  appropriate to the performed exam, to include automated exposure  control,  adjustment of the mA and/or kV according to patient's size (Including  appropriate matching for site-specific examinations), or use of iterative  reconstruction technique.             Assessment:     1. Hodgkin's disease of lymph nodes of head, face, and neck ([place])    2. Chronic anemia    3. Chronic leukopenia      Plan:     Hodgkin's lymphoma:  -- She has previously completed a full course of  systemic chemotherapy with a combination of ABVD,  In 2014. Patient was being followed by Dr. Nadyne Coombes who retired, last seen was 07/14/2018.  -- On 07/25/2018 CT chest abdomen and pelvis showed negative for the findings of recurrent lymphoma.  -- Clinically the patient is doing well and has no evidence of disease recurrence.     Plan:  -- The patient was advised to notify us if any skin lumps or bumps, swollen lymph nodes, night sweat, unintentional weight loss, worsen fatigue, new bone pain or back pain, or any concerns.  -- I have advised the patient to follow up PCP to continue age-appropriate cancer screening.  -- I have educated patient regarding lifestyle modifications, minimizing alcohol intake, refraining from smoking, healthy diet, and physical activity.  -- We will check CBC, CMP, sedimentation rate at this time.      Iron deficiency anemia  Chronic anemia:  Leukopenia:   -- No recent labs for review at this time. We will obtain labs CBC, CMP, Iron profile, ferritin at this time.      -- We will see the patient in about 2-3 weeks to review lab reports. Always sooner if required.        Orders Placed This Encounter   ??? METABOLIC PANEL, COMPREHENSIVE     Standing Status:   Future     Standing Expiration Date:   11/25/2020   ??? IRON PROFILE     Standing Status:   Future     Standing Expiration Date:   11/25/2020   ??? FERRITIN     Standing Status:   Future     Standing Expiration Date:   11/24/2020   ??? SED RATE (ESR)     Standing Status:   Future     Standing Expiration Date:   11/24/2020   ??? CBC WITH AUTOMATED DIFF     Standing Status:   Future     Standing Expiration Date:   11/24/2020           Ms. Leblanc has a reminder for a "due or due soon" health maintenance. I have asked that she contact her primary care provider for follow-up on this health maintenance.   All of patient's questions answered to their apparent satisfaction. They verbally show understanding and agreement with aforementioned plan.         Ena Dawley Annalyssa Thune, MD   11/25/2019          About 25 minutes were spent for this encounter with more than 50% of the time spent in face-to-face counseling, discussing on diagnosis and management plan going forward, and co-ordination of care.  Parts of this document has been produced using Dragon dictation system. Unrecognized errors in transcription may be present. Please Anice Wilshire not hesitate to reach out for any questions or clarifications.        CC: Candie Echevaria, NP

## 2019-11-25 NOTE — Progress Notes (Signed)
Hematology/Oncology  Progress Note    Name: Melissa Leblanc  Date: 11/25/2019  DOB: 10-Feb-1966    SEG:BTDVV M. Starleen Blue, Joaquina Nissen     Melissa Leblanc is a 54 year old female who was seen for management of her Hodgkin's lymphoma.    Current therapy: active surveillance, the patient previously completed 8 cycles of systemic chemotherapy with the ABVD regimen (2014)    Subjective:     Melissa Leblanc is a 54 year old African American woman has a history of Hodgkin's lymphoma. She has previously completed a full course of systemic chemotherapy with a combination of ABVD,  In 2014. Patient was being followed by Dr. Nadyne Coombes who retired, last seen was 07/14/2018.  The patient today reported doing great.  She denied any concerns or complaints at this time.  She follow-ups with her primary care physician for smoking cessation. Denied fever, chills, night sweat, unintentional weight loss, skin lumps or bumps, acute bleeding or bruising issues. No acute bleeding, blood in stool, dark stool, melena, hematochezia, hemoptysis, dark urine, or easily bruising. Denied headache, acute vision change, dizziness, chest pain, worsen shortness of breath, palpitation, productive cough, nausea, vomiting, abdominal pain, altered bowel habits, dysuria, new bone pain or back pain, focal numbness or weakness.     Past medical history, family history, and social history: these were reviewed and remains unchanged.    Past Medical History:   Diagnosis Date   ??? Anxiety    ??? Arthritis 10/17/2015   ??? Asthma    ??? Bladder incontinence    ??? Cancer [facility])     lymph node cancer   ??? Depression 08/17/2014   ??? Diabetes (HCC)     border line diabetes   ??? GERD (gastroesophageal reflux disease)    ??? Hypertension    ??? Lymphoma ([place])    ??? Neck mass    ??? Prediabetes    ??? VAIN I (vaginal intraepithelial neoplasia grade I)      Past Surgical History:   Procedure Laterality Date   ??? HX GYN      BTL   ??? HX HYSTERECTOMY  2004   ??? HX OTHER SURGICAL      lymph node biopsies neck   ??? HX VASCULAR  ACCESS      double right chest mediport   ??? PR RMVL TUN CTR VAD W/SUBQ PORT/PMP CTR/PRPH INSJ N/A 03/14/2016    Dr. Jimmye Norman     Social History     Socioeconomic History   ??? Marital status: SINGLE     Spouse name: Not on file   ??? Number of children: Not on file   ??? Years of education: Not on file   ??? Highest education level: Not on file   Occupational History   ??? Not on file   Social Needs   ??? Financial resource strain: Not on file   ??? Food insecurity     Worry: Not on file     Inability: Not on file   ??? Transportation needs     Medical: Not on file     Non-medical: Not on file   Tobacco Use   ??? Smoking status: Current Every Day Smoker     Packs/day: 1.00     Years: 28.00     Pack years: 28.00   ??? Smokeless tobacco: Never Used   Substance and Sexual Activity   ??? Alcohol use: Not Currently   ??? Drug use: Yes     Types: Marijuana     Comment: one  or 2 puffs occasionally (none now as of 03/11/16 per patient)   ??? Sexual activity: Never   Lifestyle   ??? Physical activity     Days per week: Not on file     Minutes per session: Not on file   ??? Stress: Not on file   Relationships   ??? Social Product manager on phone: Not on file     Gets together: Not on file     Attends religious service: Not on file     Active member of club or organization: Not on file     Attends meetings of clubs or organizations: Not on file     Relationship status: Not on file   ??? Intimate partner violence     Fear of current or ex partner: Not on file     Emotionally abused: Not on file     Physically abused: Not on file     Forced sexual activity: Not on file   Other Topics Concern   ??? Not on file   Social History Narrative   ??? Not on file     Family History   Problem Relation Age of Onset   ??? Cancer Mother         throat   ??? Breast Cancer Mother    ??? Colon Cancer Mother    ??? Hypertension Mother    ??? Colon Cancer Father    ??? Pacemaker Father    ??? Cancer Sister         thigh that moved to lung, sounds like myosarcoma?   ??? Hypertension Sister    ???  Alcohol abuse Neg Hx    ??? Arthritis-rheumatoid Neg Hx    ??? Asthma Neg Hx    ??? Bleeding Prob Neg Hx    ??? Diabetes Neg Hx    ??? Elevated Lipids Neg Hx    ??? Headache Neg Hx    ??? Lung Disease Neg Hx    ??? Migraines Neg Hx    ??? Psychiatric Disorder Neg Hx    ??? Stroke Neg Hx    ??? Mental Retardation Neg Hx      Current Outpatient Medications   Medication Sig Dispense Refill   ??? potassium chloride (KLOR-CON) 10 mEq tablet Take 1 Tab by mouth daily. Indications: prevention of low potassium in the blood 90 Tab 1   ??? lisinopril-hydroCHLOROthiazide (PRINZIDE, ZESTORETIC) 20-25 mg per tablet take 1 tablet by mouth once daily  Indications: high blood pressure 90 Tab 1   ??? cyclobenzaprine (Flexeril) 5 mg tablet Take 1 Tab by mouth three (3) times daily as needed for Muscle Spasm(s). 30 Tab 1   ??? Omeprazole delayed release (PRILOSEC D/R) 20 mg tablet Take 1 Tab by mouth daily. 30 Tab 2     Review of Systems   Constitutional: Negative for chills, diaphoresis, fever, malaise/fatigue and weight loss.   Respiratory: Negative for cough, hemoptysis, shortness of breath and wheezing.    Cardiovascular: Negative for chest pain, palpitations and leg swelling.   Gastrointestinal: Negative for abdominal pain, diarrhea, heartburn, nausea and vomiting.   Genitourinary: Negative for dysuria, frequency, hematuria and urgency.   Musculoskeletal: Negative for joint pain and myalgias.   Skin: Negative for itching and rash.   Neurological: Negative for dizziness, seizures, weakness and headaches.   Psychiatric/Behavioral: Negative for depression. The patient does not have insomnia.             Objective:  Visit Vitals  BP 122/78 (BP 1 Location: Left upper arm, BP Patient Position: Sitting, BP Cuff Size: Adult)   Pulse 63   Temp 97.9 ??F (36.6 ??C) (Temporal)   Resp 18   Ht '5\' 5"'$  (1.651 m)   Wt 96.2 kg (212 lb)   LMP  (LMP Unknown)   SpO2 98%   BMI 35.28 kg/m??       ECOG Performance Status (grade): 0  0 - able to carry on all pre-disease activity w/out  restriction  1 - restricted but able to carry out light work  2 - ambulatory and can self- care but unable to carry out work  3 - bed or chair >50% of waking hours  4 - completely disable, total care, confined to bed or chair    Physical Exam  Constitutional:       Appearance: Normal appearance.   HENT:      Head: Normocephalic and atraumatic.   Eyes:      Pupils: Pupils are equal, round, and reactive to light.   Neck:      Musculoskeletal: Neck supple.   Cardiovascular:      Rate and Rhythm: Normal rate and regular rhythm.      Heart sounds: Normal heart sounds.   Pulmonary:      Effort: Pulmonary effort is normal.      Breath sounds: Normal breath sounds.   Abdominal:      General: Bowel sounds are normal.      Palpations: Abdomen is soft.      Tenderness: There is no abdominal tenderness. There is no guarding.   Musculoskeletal: Normal range of motion.      Right lower leg: No edema.      Left lower leg: No edema.   Skin:     General: Skin is warm.   Neurological:      General: No focal deficit present.      Mental Status: She is alert and oriented to person, place, and time. Mental status is at baseline.          Diagnostics:      No results found for this or any previous visit (from the past 96 hour(s)).    Imaging:  Results for orders placed during the hospital encounter of 09/24/12   IR BX BONE MARROW    Narrative PREOPERATIVE DIAGNOSIS: Non-Hodgkin's lymphoma.    POSTOPERATIVE DIAGNOSIS: Same    ATTENDING: Dr. Lois Huxley, M.D.    ASSISTANT: None.    PROCEDURES: Fluoroscopically guided bone marrow biopsy.    ANESTHESIA: Local 1% lidocaine as well as moderate intravenous sedation with  Versed and fentanyl given and monitored per independently trained  interventional radiology nurse under my direct supervision for 30 minutes.  Please see nursing records for detailed medication dosing and timing of the  procedure.    CONTRAST: None.    COMPLICATIONS: None    DRAIN: No    CATHETER: None.    EBL:  Minimal.    SPECIMEN: 2 aspirates and single core biopsy was obtained.    Fluoroscopy time: 63 seconds.    TECHNIQUE: After detailed explanation of risks and benefits of the procedure  verbal and written consent were obtained. Patient was brought to the  interventional radiology room and placed prone on the table. Timeout was  performed. Scout view was obtained. Target lesion was identified and skin was  marked overlying left iliac crest. Left iliac crest region was prepped and  draped in the usual  sterile fashion. 1% lidocaine solution was instilled in the  skin superficial and deep subcutaneous soft tissues overlying the biopsy  region.    Under direct fluoroscopy guidance using 11-gauge OnControl biopsy needle was  advanced down through left iliac crest periosteum. Single pass was made. 2  aspirates and single core biopsy were obtained. Given to pathology on site.    FINDINGS: Fluoroscopic guidance demonstrated good position of the biopsy needle.      Impression IMPRESSION:    Successful, uncomplicated fluoroscopically guided bone marrow biopsy.         Results for orders placed during the hospital encounter of 03/11/16   XR CHEST PA LAT    Narrative Chest PA and lateral    INDICATION: Postoperative    COMPARISON: Plain radiograph 11/28/2014, CT 06/26/2015    FINDINGS:    Two views of the chest were obtained. Right-sided double lumen central venous  catheter is unchanged. Cardiac silhouette and pulmonary vascularity are within  normal limits. No confluent consolidation. No acute osseous abnormality.      Impression IMPRESSION:    No pneumothorax. No confluent consolidation.         Results for orders placed during the hospital encounter of 07/25/18   CT CHEST ABD PELV W CONT    Narrative CT CHEST ABDOMEN AND PELVIS WITH CONTRAST        COMPARISON:  January 23, 2017 .    INDICATIONS:    Lymphoma.    Following the uneventful administration of  oral contrast and  100 cc of Isovue  300 scanning of the chest, abdomen and  pelvis is performed with a multislice  scanner. Coronal sagittal and axial reconstructions were created from the 3 D  data set.    FINDINGS:     CT CHEST FINDINGS:    Thyroid/Base Of Neck: There is generalized thyroid enlargement without focal  mass.    Lungs:   Clear.      .  Pleural Spaces:  Unremarkable.       Chest Wall:   No breast mass is evident.  No axillary lymphadenopathy is evident.       Mediastinum, Hila, Great Vessels, Heart:  A punctate calcification is again noted within the right internal jugular vein  just above the innominate and the second within the SVC just above the right  atrium     .    CT ABDOMEN FINDINGS:     Liver/Biliary: Normal.    Spleen: Normal.    Adrenal Glands: Normal.    Kidneys: Normal.    Pancreas: Normal.    Stomach, Small Bowel, appendix, and Colon: Normal.    There is no significant adenopathy.    The abdominal aorta is unremarkable. The IVC is unremarkable.    Peritoneal Spaces: There is no free fluid or free air.    Abdominal Wall: No hernia or mass is evident.    Bladder: Unremarkable.    The uterus is nearly absent. The ovaries are normal.        Osseous Structures Of The Chest, Abdomen And Pelvis: Unremarkable for age.      Impression IMPRESSION:     Negative for the findings of recurrent lymphoma.  Punctate calcifications in the right internal jugular and SVC suggests residual  from Mediport. This is unchanged from prior studies    .    All CT scans at this facility are performed using dose optimization technique as  appropriate to the performed exam, to include automated exposure  control,  adjustment of the mA and/or kV according to patient's size (Including  appropriate matching for site-specific examinations), or use of iterative  reconstruction technique.             Assessment:     1. Hodgkin's disease of lymph nodes of head, face, and neck ([place])    2. Chronic anemia    3. Chronic leukopenia      Plan:     Hodgkin's lymphoma:  -- She has previously completed a full  course of systemic chemotherapy with a combination of ABVD,  In 2014. Patient was being followed by Dr. Nadyne Coombes who retired, last seen was 07/14/2018.  -- On 07/25/2018 CT chest abdomen and pelvis showed negative for the findings of recurrent lymphoma.  -- Clinically the patient is doing well and has no evidence of disease recurrence.     Plan:  -- The patient was advised to notify us if any skin lumps or bumps, swollen lymph nodes, night sweat, unintentional weight loss, worsen fatigue, new bone pain or back pain, or any concerns.  -- I have advised the patient to follow up PCP to continue age-appropriate cancer screening.  -- I have educated patient regarding lifestyle modifications, minimizing alcohol intake, refraining from smoking, healthy diet, and physical activity.  -- We will check CBC, CMP, sedimentation rate at this time.      Iron deficiency anemia  Chronic anemia:  Leukopenia:   -- No recent labs for review at this time. We will obtain labs CBC, CMP, Iron profile, ferritin at this time.      -- We will see the patient in about 2-3 weeks to review lab reports. Always sooner if required.        Orders Placed This Encounter   ??? METABOLIC PANEL, COMPREHENSIVE     Standing Status:   Future     Standing Expiration Date:   11/25/2020   ??? IRON PROFILE     Standing Status:   Future     Standing Expiration Date:   11/25/2020   ??? FERRITIN     Standing Status:   Future     Standing Expiration Date:   11/24/2020   ??? SED RATE (ESR)     Standing Status:   Future     Standing Expiration Date:   11/24/2020   ??? CBC WITH AUTOMATED DIFF     Standing Status:   Future     Standing Expiration Date:   11/24/2020           Ms. Leblanc has a reminder for a "due or due soon" health maintenance. I have asked that she contact her primary care provider for follow-up on this health maintenance.   All of patient's questions answered to their apparent satisfaction. They verbally show understanding and agreement with aforementioned plan.          Ena Dawley Anothony Bursch, MD  11/25/2019          About 25 minutes were spent for this encounter with more than 50% of the time spent in face-to-face counseling, discussing on diagnosis and management plan going forward, and co-ordination of care.  Parts of this document has been produced using Dragon dictation system. Unrecognized errors in transcription may be present. Please Ikenna Ohms not hesitate to reach out for any questions or clarifications.        CC: Candie Echevaria, NP

## 2019-12-23 ENCOUNTER — Telehealth: Attending: Internal Medicine | Primary: Family

## 2019-12-23 ENCOUNTER — Telehealth: Admit: 2019-12-23 | Discharge: 2019-12-23 | Payer: MEDICARE | Attending: Internal Medicine | Primary: Family

## 2019-12-23 DIAGNOSIS — C8191 Hodgkin lymphoma, unspecified, lymph nodes of head, face, and neck: Secondary | ICD-10-CM

## 2019-12-23 MED ORDER — FERROUS SULFATE ER 142 MG (45 MG IRON) TAB
142 mg (45 mg iron) | ORAL_TABLET | Freq: Every day | ORAL | 3 refills | Status: DC
Start: 2019-12-23 — End: 2020-10-04

## 2019-12-23 NOTE — Progress Notes (Signed)
Melissa Leblanc is a 54 y.o. female, evaluated via audio-only technology on 12/23/2019 for history of lymphoma.    Assessment & Plan:   Hodgkin's lymphoma, IIB:  -- She has previously completed a full course of systemic chemotherapy with a combination of ABVD,  In 2014. Patient was being followed by Dr. Nadyne Coombes who retired, last seen was 07/14/2018.  -- On 07/25/2018 CT chest abdomen and pelvis showed negative for the findings of recurrent lymphoma.  -- Clinically the patient is doing well and has no evidence of disease recurrence.   -- Today I have reviewed with the patient about recent lab reports.   11/25/2019 CBC reported hemoglobin 11.9, hematocrit 37.2%, WBC 5.3, and platelet 233,000.  ESR was within normal limit, 20.  Iron profile showed saturation 16% and ferritin 69.    Plan:  -- The patient was advised to notify us if any skin lumps or bumps, swollen lymph nodes, night sweat, unintentional weight loss, worsen fatigue, new bone pain or back pain, or any concerns.  -- I have advised the patient to follow up PCP to continue age-appropriate cancer screening.  -- I have educated patient regarding lifestyle modifications, minimizing alcohol intake, refraining from smoking, healthy diet, and physical activity.      Iron deficiency anemia  Chronic anemia:  Leukopenia:   -- Today I have reviewed with the patient about recent lab reports.   11/25/2019 CBC reported hemoglobin 11.9, hematocrit 37.2%, WBC 5.3, and platelet 233,000.  ESR was within normal limit, 20.  Iron profile showed saturation 16% and ferritin 69.  -- The patient was advised to take iron pills. We will send SlowFe prescriptions today.   -- She will f/u PCP/GI for colonoscopy screening as indicated.    -- We will see the patient in about 4 months. Always sooner if required.      12  Subjective:   Melissa Leblanc is a 54 year old African American woman has a history of Hodgkin's lymphoma. She has previously completed a full course of systemic chemotherapy with a  combination of ABVD,  In 2014. Patient was being followed by Dr. Nadyne Coombes who retired, last seen was 07/14/2018.    The patient today reported doing well since last visit.  She denied any concerns or complaints at this time.  She follow-ups with her primary care physician for smoking cessation. Denied fever, chills, night sweat, unintentional weight loss, skin lumps or bumps, acute bleeding or bruising issues. No acute bleeding, blood in stool, dark stool, melena, hematochezia, hemoptysis, dark urine, or easily bruising. Denied headache, acute vision change, dizziness, chest pain, worsen shortness of breath, palpitation, productive cough, nausea, vomiting, abdominal pain, altered bowel habits, dysuria, new bone pain or back pain, focal numbness or weakness.         Prior to Admission medications    Medication Sig Start Date End Date Taking? Authorizing Provider   ferrous sulfate (SLOW FE) 142 mg (45 mg iron) ER tablet Take 1 Tab by mouth Daily (before breakfast) for 30 days. 12/23/19 01/22/20 Yes Tangee Marszalek, Ena Dawley, MD   potassium chloride (KLOR-CON) 10 mEq tablet Take 1 Tab by mouth daily. Indications: prevention of low potassium in the blood 08/19/19   Candie Echevaria, NP   lisinopril-hydroCHLOROthiazide (PRINZIDE, ZESTORETIC) 20-25 mg per tablet take 1 tablet by mouth once daily  Indications: high blood pressure 08/19/19   Candie Echevaria, NP   cyclobenzaprine (Flexeril) 5 mg tablet Take 1 Tab by mouth three (3) times daily as needed for Muscle  Spasm(s). 03/30/19   Candie Echevaria, NP   Omeprazole delayed release (PRILOSEC D/R) 20 mg tablet Take 1 Tab by mouth daily. 03/30/19   Candie Echevaria, NP         Review of Systems   Constitutional: Negative for chills, diaphoresis, fever, malaise/fatigue and weight loss.   Respiratory: Negative for cough, hemoptysis, shortness of breath and wheezing.    Cardiovascular: Negative for chest pain, palpitations and leg swelling.   Gastrointestinal: Negative for  abdominal pain, diarrhea, heartburn, nausea and vomiting.   Genitourinary: Negative for dysuria, frequency, hematuria and urgency.   Musculoskeletal: Negative for joint pain and myalgias.   Skin: Negative for itching and rash.   Neurological: Negative for dizziness, seizures, weakness and headaches.   Psychiatric/Behavioral: Negative for depression. The patient does not have insomnia.        Patient-Reported Vitals 12/23/2019   Patient-Reported Weight 211.4lb        Melissa Leblanc, who was evaluated through a patient-initiated, synchronous (real-time) audio only encounter, and/or her healthcare decision maker, is aware that it is a billable service, with coverage as determined by her insurance carrier. She provided verbal consent to proceed: Yes. She has not had a related appointment within my department in the past 7 days or scheduled within the next 24 hours.      Total Time: minutes: 11-20 minutes    Flossie Buffy, MD

## 2019-12-23 NOTE — Progress Notes (Signed)
Melissa Leblanc is a 54 y.o. female, evaluated via audio-only technology on 12/23/2019 for history of lymphoma.    Assessment & Plan:   Hodgkin's lymphoma, IIB:  -- She has previously completed a full course of systemic chemotherapy with a combination of ABVD,  In 2014. Patient was being followed by Dr. Shabazz who retired, last seen was 07/14/2018.  -- On 07/25/2018 CT chest abdomen and pelvis showed negative for the findings of recurrent lymphoma.  -- Clinically the patient is doing well and has no evidence of disease recurrence.   -- Today I have reviewed with the patient about recent lab reports.   11/25/2019 CBC reported hemoglobin 11.9, hematocrit 37.2%, WBC 5.3, and platelet 233,000.  ESR was within normal limit, 20.  Iron profile showed saturation 16% and ferritin 69.    Plan:  -- The patient was advised to notify us if any skin lumps or bumps, swollen lymph nodes, night sweat, unintentional weight loss, worsen fatigue, new bone pain or back pain, or any concerns.  -- I have advised the patient to follow up PCP to continue age-appropriate cancer screening.  -- I have educated patient regarding lifestyle modifications, minimizing alcohol intake, refraining from smoking, healthy diet, and physical activity.      Iron deficiency anemia  Chronic anemia:  Leukopenia:   -- Today I have reviewed with the patient about recent lab reports.   11/25/2019 CBC reported hemoglobin 11.9, hematocrit 37.2%, WBC 5.3, and platelet 233,000.  ESR was within normal limit, 20.  Iron profile showed saturation 16% and ferritin 69.  -- The patient was advised to take iron pills. We will send SlowFe prescriptions today.   -- She will f/u PCP/GI for colonoscopy screening as indicated.    -- We will see the patient in about 4 months. Always sooner if required.      12  Subjective:   Melissa Leblanc is a 54-year-old African American woman has a history of Hodgkin's lymphoma. She has previously completed a full course of systemic chemotherapy with a  combination of ABVD,  In 2014. Patient was being followed by Dr. Shabazz who retired, last seen was 07/14/2018.    The patient today reported doing well since last visit.  She denied any concerns or complaints at this time.  She follow-ups with her primary care physician for smoking cessation. Denied fever, chills, night sweat, unintentional weight loss, skin lumps or bumps, acute bleeding or bruising issues. No acute bleeding, blood in stool, dark stool, melena, hematochezia, hemoptysis, dark urine, or easily bruising. Denied headache, acute vision change, dizziness, chest pain, worsen shortness of breath, palpitation, productive cough, nausea, vomiting, abdominal pain, altered bowel habits, dysuria, new bone pain or back pain, focal numbness or weakness.         Prior to Admission medications    Medication Sig Start Date End Date Taking? Authorizing Provider   ferrous sulfate (SLOW FE) 142 mg (45 mg iron) ER tablet Take 1 Tab by mouth Daily (before breakfast) for 30 days. 12/23/19 01/22/20 Yes Akiem Urieta P, MD   potassium chloride (KLOR-CON) 10 mEq tablet Take 1 Tab by mouth daily. Indications: prevention of low potassium in the blood 08/19/19   Fields-Sykes, Karen A, NP   lisinopril-hydroCHLOROthiazide (PRINZIDE, ZESTORETIC) 20-25 mg per tablet take 1 tablet by mouth once daily  Indications: high blood pressure 08/19/19   Fields-Sykes, Karen A, NP   cyclobenzaprine (Flexeril) 5 mg tablet Take 1 Tab by mouth three (3) times daily as needed for Muscle   Spasm(s). 03/30/19   Fields-Sykes, Karen A, NP   Omeprazole delayed release (PRILOSEC D/R) 20 mg tablet Take 1 Tab by mouth daily. 03/30/19   Fields-Sykes, Karen A, NP         Review of Systems   Constitutional: Negative for chills, diaphoresis, fever, malaise/fatigue and weight loss.   Respiratory: Negative for cough, hemoptysis, shortness of breath and wheezing.    Cardiovascular: Negative for chest pain, palpitations and leg swelling.   Gastrointestinal: Negative for  abdominal pain, diarrhea, heartburn, nausea and vomiting.   Genitourinary: Negative for dysuria, frequency, hematuria and urgency.   Musculoskeletal: Negative for joint pain and myalgias.   Skin: Negative for itching and rash.   Neurological: Negative for dizziness, seizures, weakness and headaches.   Psychiatric/Behavioral: Negative for depression. The patient does not have insomnia.        Patient-Reported Vitals 12/23/2019   Patient-Reported Weight 211.4lb        Melissa Leblanc, who was evaluated through a patient-initiated, synchronous (real-time) audio only encounter, and/or her healthcare decision maker, is aware that it is a billable service, with coverage as determined by her insurance carrier. She provided verbal consent to proceed: Yes. She has not had a related appointment within my department in the past 7 days or scheduled within the next 24 hours.      Total Time: minutes: 11-20 minutes    Posie Lillibridge P Yareth Macdonnell, MD

## 2019-12-27 NOTE — Telephone Encounter (Signed)
Patient called asking if it is ok to get the [employer] Covid vaccine with her medical conditions.  Please call her back at [phone]

## 2019-12-29 NOTE — Telephone Encounter (Signed)
TC to pt she was informed that provider stated that she recommend her to receive the Covid vaccine. Patient verbalized her understanding.

## 2020-01-04 ENCOUNTER — Emergency Department: Payer: MEDICARE | Primary: Family

## 2020-01-04 ENCOUNTER — Emergency Department: Admit: 2020-01-04 | Payer: MEDICARE | Primary: Family

## 2020-01-04 ENCOUNTER — Inpatient Hospital Stay
Admit: 2020-01-04 | Discharge: 2020-01-04 | Disposition: A | Discharge status: Home / Self Care | Payer: MEDICARE | Attending: Emergency Medicine

## 2020-01-04 DIAGNOSIS — S3992XA Unspecified injury of lower back, initial encounter: Secondary | ICD-10-CM

## 2020-01-04 MED ORDER — IBUPROFEN 600 MG TAB
600 mg | ORAL | Status: AC
Start: 2020-01-04 — End: 2020-01-04
  Administered 2020-01-04: 20:00:00 via ORAL

## 2020-01-04 MED FILL — IBUPROFEN 600 MG TAB: 600 mg | ORAL | Qty: 1
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
## 2020-01-04 NOTE — ED Notes (Signed)
PA Joaquim Lai talking with patient.
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
## 2020-01-04 NOTE — ED Notes (Signed)
Patient discharged with instructions and follow up care. Verbalized understanding. Left ambulatory with self.
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
## 2020-02-25 NOTE — Telephone Encounter (Signed)
TC to patient she was notified that her Fit Test results were negative. Patient verbalized her understanding.

## 2020-04-18 ENCOUNTER — Other Ambulatory Visit: Payer: MEDICAID | Primary: Family

## 2020-04-26 ENCOUNTER — Encounter: Payer: MEDICARE | Primary: Family

## 2020-05-01 ENCOUNTER — Encounter: Attending: Internal Medicine | Primary: Family

## 2020-05-11 ENCOUNTER — Inpatient Hospital Stay: Admit: 2020-05-11 | Payer: MEDICARE | Primary: Family

## 2020-05-11 ENCOUNTER — Ambulatory Visit: Admit: 2020-05-11 | Discharge: 2020-05-12 | Payer: MEDICARE | Primary: Family

## 2020-05-11 DIAGNOSIS — D649 Anemia, unspecified: Secondary | ICD-10-CM

## 2020-05-11 LAB — CBC WITH AUTO DIFFERENTIAL
Basophils %: 0 % (ref 0–2)
Basophils Absolute: 0 10*3/uL (ref 0.0–0.1)
Eosinophils %: 1 % (ref 0–5)
Eosinophils Absolute: 0 10*3/uL (ref 0.0–0.4)
Hematocrit: 39.1 % (ref 35.0–45.0)
Hemoglobin: 12.2 g/dL (ref 12.0–16.0)
Lymphocytes %: 39 % (ref 21–52)
Lymphocytes Absolute: 2 10*3/uL (ref 0.9–3.6)
MCH: 24.9 PG (ref 24.0–34.0)
MCHC: 31.2 g/dL (ref 31.0–37.0)
MCV: 80 FL (ref 78.0–100.0)
MPV: 10.7 FL (ref 9.2–11.8)
Monocytes %: 10 % (ref 3–10)
Monocytes Absolute: 0.5 10*3/uL (ref 0.05–1.2)
Neutrophils %: 50 % (ref 40–73)
Neutrophils Absolute: 2.5 10*3/uL (ref 1.8–8.0)
Platelets: 246 10*3/uL (ref 135–420)
RBC: 4.89 M/uL (ref 4.20–5.30)
RDW: 17.3 % — ABNORMAL HIGH (ref 11.6–14.5)
WBC: 5.1 10*3/uL (ref 4.6–13.2)

## 2020-05-11 LAB — COMPREHENSIVE METABOLIC PANEL
ALT: 18 U/L (ref 13–56)
AST: 11 U/L (ref 10–38)
Albumin/Globulin Ratio: 0.9 (ref 0.8–1.7)
Albumin: 3.6 g/dL (ref 3.4–5.0)
Alkaline Phosphatase: 68 U/L (ref 45–117)
Anion Gap: 5 mmol/L (ref 3.0–18)
BUN: 8 MG/DL (ref 7.0–18)
Bun/Cre Ratio: 9 — ABNORMAL LOW (ref 12–20)
CO2: 27 mmol/L (ref 21–32)
Calcium: 9.1 MG/DL (ref 8.5–10.1)
Chloride: 108 mmol/L (ref 100–111)
Creatinine: 0.89 MG/DL (ref 0.6–1.3)
EGFR IF NonAfrican American: >60 mL/min/{1.73_m2} (ref >60)
GFR African American: >60 mL/min/{1.73_m2} (ref >60)
Globulin: 4.1 g/dL — ABNORMAL HIGH (ref 2.0–4.0)
Glucose: 113 mg/dL — ABNORMAL HIGH (ref 74–99)
Potassium: 4.3 mmol/L (ref 3.5–5.5)
Sodium: 140 mmol/L (ref 136–145)
Total Bilirubin: 0.3 MG/DL (ref 0.2–1.0)
Total Protein: 7.7 g/dL (ref 6.4–8.2)

## 2020-05-11 LAB — IRON AND TIBC
Iron Saturation: 18 % — ABNORMAL LOW (ref 20–50)
Iron: 48 ug/dL — ABNORMAL LOW (ref 50–175)
TIBC: 273 ug/dL (ref 250–450)

## 2020-05-11 LAB — FERRITIN
Ferritin: 81 NG/ML (ref 8–388)
Ferritin: 81 NG/ML (ref 8–388)

## 2020-05-11 LAB — METABOLIC PANEL, COMPREHENSIVE
A-G Ratio: 0.9 (ref 0.8–1.7)
ALT (SGPT): 18 U/L (ref 13–56)
AST (SGOT): 11 U/L (ref 10–38)
Albumin: 3.6 g/dL (ref 3.4–5.0)
Alk. phosphatase: 68 U/L (ref 45–117)
Anion gap: 5 mmol/L (ref 3.0–18)
BUN/Creatinine ratio: 9 — ABNORMAL LOW (ref 12–20)
BUN: 8 MG/DL (ref 7.0–18)
Bilirubin, total: 0.3 MG/DL (ref 0.2–1.0)
CO2: 27 mmol/L (ref 21–32)
Calcium: 9.1 MG/DL (ref 8.5–10.1)
Chloride: 108 mmol/L (ref 100–111)
Creatinine: 0.89 MG/DL (ref 0.6–1.3)
GFR est AA: >60 mL/min/{1.73_m2} (ref >60)
GFR est non-AA: >60 mL/min/{1.73_m2} (ref >60)
Globulin: 4.1 g/dL — ABNORMAL HIGH (ref 2.0–4.0)
Glucose: 113 mg/dL — ABNORMAL HIGH (ref 74–99)
Potassium: 4.3 mmol/L (ref 3.5–5.5)
Protein, total: 7.7 g/dL (ref 6.4–8.2)
Sodium: 140 mmol/L (ref 136–145)

## 2020-05-11 LAB — CBC WITH AUTOMATED DIFF
ABS. BASOPHILS: 0 10*3/uL (ref 0.0–0.1)
ABS. EOSINOPHILS: 0 10*3/uL (ref 0.0–0.4)
ABS. LYMPHOCYTES: 2 10*3/uL (ref 0.9–3.6)
ABS. MONOCYTES: 0.5 10*3/uL (ref 0.05–1.2)
ABS. NEUTROPHILS: 2.5 10*3/uL (ref 1.8–8.0)
BASOPHILS: 0 % (ref 0–2)
EOSINOPHILS: 1 % (ref 0–5)
HCT: 39.1 % (ref 35.0–45.0)
HGB: 12.2 g/dL (ref 12.0–16.0)
LYMPHOCYTES: 39 % (ref 21–52)
MCH: 24.9 PG (ref 24.0–34.0)
MCHC: 31.2 g/dL (ref 31.0–37.0)
MCV: 80 FL (ref 78.0–100.0)
MONOCYTES: 10 % (ref 3–10)
MPV: 10.7 FL (ref 9.2–11.8)
NEUTROPHILS: 50 % (ref 40–73)
PLATELET: 246 10*3/uL (ref 135–420)
RBC: 4.89 M/uL (ref 4.20–5.30)
RDW: 17.3 % — ABNORMAL HIGH (ref 11.6–14.5)
WBC: 5.1 10*3/uL (ref 4.6–13.2)

## 2020-05-11 LAB — IRON PROFILE
Iron % saturation: 18 % — ABNORMAL LOW (ref 20–50)
Iron: 48 ug/dL — ABNORMAL LOW (ref 50–175)
TIBC: 273 ug/dL (ref 250–450)

## 2020-05-25 ENCOUNTER — Telehealth: Attending: Internal Medicine | Primary: Family

## 2020-05-25 ENCOUNTER — Telehealth: Admit: 2020-05-25 | Discharge: 2020-05-25 | Payer: MEDICARE | Attending: Internal Medicine | Primary: Family

## 2020-05-25 DIAGNOSIS — C8191 Hodgkin lymphoma, unspecified, lymph nodes of head, face, and neck: Secondary | ICD-10-CM

## 2020-05-25 NOTE — Progress Notes (Signed)
Melissa Leblanc is a 54 y.o. female, evaluated via audio-only technology on 05/25/2020 for Follow-up  .    Assessment & Plan:   Hodgkin's lymphoma, IIB:  -- She has previously completed a full course of systemic chemotherapy with a combination of ABVD,  In 2014. Patient was being followed by Dr. Nadyne Coombes who retired, last seen was 07/14/2018.  -- On 07/25/2018 CT chest abdomen and pelvis showed negative for the findings of recurrent lymphoma.  -- Clinically the patient is doing well and has no evidence of disease recurrence.   -- Today I have reviewed with the patient about recent lab reports.   05/11/2020 CBC reported hemoglobin 12.2, hematocrit 39.1%, WBC 5.1, and platelet 246,000.      Plan:  -- The patient was advised to notify us if any skin lumps or bumps, swollen lymph nodes, night sweat, unintentional weight loss, worsen fatigue, new bone pain or back pain, or any concerns.  -- I have advised the patient to follow up PCP to continue age-appropriate cancer screening.  -- I have educated patient regarding lifestyle modifications, minimizing alcohol intake, refraining from smoking, healthy diet, and physical activity.  ??  ??  Iron deficiency anemia  Chronic anemia:  Leukopenia:   -- Today I have reviewed with the patient about recent lab reports.   05/11/2020 CBC reported hemoglobin 12.2, hematocrit 39.1%, WBC 5.1, and platelet 246,000.    Iron profile showed saturation 18% and ferritin 81.  -- The patient was advised to take iron pills.   -- She will f/u PCP/GI for colonoscopy screening as indicated.  ??  -- We will see the patient in about 4 months. Always sooner if required    12  Subjective:   Melissa Leblanc is a 54 year old African American woman has a history of Hodgkin's lymphoma. She has previously completed a full course of systemic chemotherapy with a combination of ABVD,  In 2014. Patient was being followed by Dr. Nadyne Coombes who retired, last seen was 07/14/2018. The patient denied any concerns or complaints at this  time.  She follow-ups with her primary care physician for smoking cessation. Denied fever, chills, night sweat, unintentional weight loss, skin lumps or bumps, acute bleeding or bruising issues. No acute bleeding, blood in stool, dark stool, melena, hematochezia, hemoptysis, dark urine, or easily bruising. Denied headache, acute vision change, dizziness, chest pain, worsen shortness of breath, palpitation, productive cough, nausea, vomiting, abdominal pain, altered bowel habits, dysuria, new bone pain or back pain, focal numbness or weakness.     Prior to Admission medications    Medication Sig Start Date End Date Taking? Authorizing Provider   potassium chloride (KLOR-CON) 10 mEq tablet Take 1 Tab by mouth daily. Indications: prevention of low potassium in the blood 08/19/19   Candie Echevaria, NP   lisinopril-hydroCHLOROthiazide (PRINZIDE, ZESTORETIC) 20-25 mg per tablet take 1 tablet by mouth once daily  Indications: high blood pressure 08/19/19   Candie Echevaria, NP   cyclobenzaprine (Flexeril) 5 mg tablet Take 1 Tab by mouth three (3) times daily as needed for Muscle Spasm(s). 03/30/19   Candie Echevaria, NP   Omeprazole delayed release (PRILOSEC D/R) 20 mg tablet Take 1 Tab by mouth daily. 03/30/19   Candie Echevaria, NP         Review of Systems   Constitutional: Negative.    HENT: Negative.    Eyes: Negative.    Respiratory: Negative.    Cardiovascular: Negative.    Gastrointestinal: Negative.  Genitourinary: Negative.    Musculoskeletal: Negative.    Skin: Negative.    Neurological: Negative.    Endo/Heme/Allergies: Negative.    Psychiatric/Behavioral: Negative.        Patient-Reported Vitals 05/25/2020   Patient-Reported Weight 211lb       [name], who was evaluated through a patient-initiated, synchronous (real-time) audio only encounter, and/or her healthcare decision maker, is aware that it is a billable service, with coverage as determined by her insurance carrier. She provided  verbal consent to proceed: YES -Consent obtained within past 12 months Yes. . She has not had a related appointment within my department in the past 7 days or scheduled within the next 24 hours.    Time Documentation 20 min     Bunnie Pion, NP         I have assessed the patient independently and agreed with the full assessment as outlined.    Tien P. Do, M.D.

## 2020-07-17 ENCOUNTER — Encounter

## 2020-07-31 ENCOUNTER — Encounter

## 2020-07-31 NOTE — Telephone Encounter (Signed)
 Requested Prescriptions     Pending Prescriptions Disp Refills   . potassium chloride (KLOR-CON M10) 10 mEq tablet 90 Tablet 1     Sig: Take 1 Tablet by mouth daily. Indications: prevention of low potassium in the blood   . cyclobenzaprine (Flexeril) 5 mg tablet 30 Tablet 1     Sig: Take 1 Tablet by mouth three (3) times daily as needed for Muscle Spasm(s).   . Omeprazole delayed release (PRILOSEC D/R) 20 mg tablet 30 Tablet 2     Sig: Take 1 Tablet by mouth daily.

## 2020-07-31 NOTE — Telephone Encounter (Signed)
May be Karen's patient

## 2020-09-08 ENCOUNTER — Encounter

## 2020-09-15 ENCOUNTER — Ambulatory Visit: Payer: MEDICARE | Primary: Family

## 2020-09-22 ENCOUNTER — Inpatient Hospital Stay: Admit: 2020-09-22 | Payer: MEDICARE | Primary: Family

## 2020-09-22 DIAGNOSIS — D649 Anemia, unspecified: Secondary | ICD-10-CM

## 2020-09-22 DIAGNOSIS — Z1231 Encounter for screening mammogram for malignant neoplasm of breast: Secondary | ICD-10-CM

## 2020-09-22 LAB — COMPREHENSIVE METABOLIC PANEL
ALT: 37 U/L (ref 13–56)
AST: 28 U/L (ref 10–38)
Albumin/Globulin Ratio: 0.8 (ref 0.8–1.7)
Albumin: 3.7 g/dL (ref 3.4–5.0)
Alkaline Phosphatase: 63 U/L (ref 45–117)
Anion Gap: 5 mmol/L (ref 3.0–18)
BUN: 11 MG/DL (ref 7.0–18)
Bun/Cre Ratio: 13 (ref 12–20)
CO2: 27 mmol/L (ref 21–32)
Calcium: 9.7 MG/DL (ref 8.5–10.1)
Chloride: 106 mmol/L (ref 100–111)
Creatinine: 0.85 MG/DL (ref 0.6–1.3)
EGFR IF NonAfrican American: >60 mL/min/{1.73_m2} (ref >60)
GFR African American: >60 mL/min/{1.73_m2} (ref >60)
Globulin: 4.4 g/dL — ABNORMAL HIGH (ref 2.0–4.0)
Glucose: 95 mg/dL (ref 74–99)
Potassium: 4.5 mmol/L (ref 3.5–5.5)
Sodium: 138 mmol/L (ref 136–145)
Total Bilirubin: 0.4 MG/DL (ref 0.2–1.0)
Total Protein: 8.1 g/dL (ref 6.4–8.2)

## 2020-09-22 LAB — CBC WITH AUTO DIFFERENTIAL
Basophils %: 0 % (ref 0–2)
Basophils Absolute: 0 10*3/uL (ref 0.0–0.1)
Eosinophils %: 0 % (ref 0–5)
Eosinophils Absolute: 0 10*3/uL (ref 0.0–0.4)
Granulocyte Absolute Count: 0 10*3/uL (ref 0.00–0.04)
Hematocrit: 40 % (ref 35.0–45.0)
Hemoglobin: 12.3 g/dL (ref 12.0–16.0)
Immature Granulocytes: 0 % (ref 0.0–0.5)
Lymphocytes %: 34 % (ref 21–52)
Lymphocytes Absolute: 1.7 10*3/uL (ref 0.9–3.6)
MCH: 25.3 PG (ref 24.0–34.0)
MCHC: 30.8 g/dL — ABNORMAL LOW (ref 31.0–37.0)
MCV: 82.1 FL (ref 78.0–100.0)
MPV: 11 FL (ref 9.2–11.8)
Monocytes %: 11 % — ABNORMAL HIGH (ref 3–10)
Monocytes Absolute: 0.5 10*3/uL (ref 0.05–1.2)
NRBC Absolute: 0 10*3/uL (ref 0.00–0.01)
Neutrophils %: 54 % (ref 40–73)
Neutrophils Absolute: 2.6 10*3/uL (ref 1.8–8.0)
Nucleated RBCs: 0 PER 100 WBC
Platelets: 256 10*3/uL (ref 135–420)
RBC: 4.87 M/uL (ref 4.20–5.30)
RDW: 17.1 % — ABNORMAL HIGH (ref 11.6–14.5)
WBC: 4.9 10*3/uL (ref 4.6–13.2)

## 2020-09-22 LAB — IRON AND TIBC
Iron Saturation: 18 % — ABNORMAL LOW (ref 20–50)
Iron: 51 ug/dL (ref 50–175)
TIBC: 284 ug/dL (ref 250–450)

## 2020-09-22 LAB — FERRITIN
Ferritin: 201 NG/ML (ref 8–388)
Ferritin: 201 NG/ML (ref 8–388)

## 2020-09-22 LAB — SEDIMENTATION RATE: Sed Rate: 28 mm/hr (ref 0–30)

## 2020-09-22 LAB — METABOLIC PANEL, COMPREHENSIVE
A-G Ratio: 0.8 (ref 0.8–1.7)
ALT (SGPT): 37 U/L (ref 13–56)
AST (SGOT): 28 U/L (ref 10–38)
Albumin: 3.7 g/dL (ref 3.4–5.0)
Alk. phosphatase: 63 U/L (ref 45–117)
Anion gap: 5 mmol/L (ref 3.0–18)
BUN/Creatinine ratio: 13 (ref 12–20)
BUN: 11 MG/DL (ref 7.0–18)
Bilirubin, total: 0.4 MG/DL (ref 0.2–1.0)
CO2: 27 mmol/L (ref 21–32)
Calcium: 9.7 MG/DL (ref 8.5–10.1)
Chloride: 106 mmol/L (ref 100–111)
Creatinine: 0.85 MG/DL (ref 0.6–1.3)
GFR est AA: >60 mL/min/{1.73_m2} (ref >60)
GFR est non-AA: >60 mL/min/{1.73_m2} (ref >60)
Globulin: 4.4 g/dL — ABNORMAL HIGH (ref 2.0–4.0)
Glucose: 95 mg/dL (ref 74–99)
Potassium: 4.5 mmol/L (ref 3.5–5.5)
Protein, total: 8.1 g/dL (ref 6.4–8.2)
Sodium: 138 mmol/L (ref 136–145)

## 2020-09-22 LAB — CBC WITH AUTOMATED DIFF
ABS. BASOPHILS: 0 10*3/uL (ref 0.0–0.1)
ABS. EOSINOPHILS: 0 10*3/uL (ref 0.0–0.4)
ABS. IMM. GRANS.: 0 10*3/uL (ref 0.00–0.04)
ABS. LYMPHOCYTES: 1.7 10*3/uL (ref 0.9–3.6)
ABS. MONOCYTES: 0.5 10*3/uL (ref 0.05–1.2)
ABS. NEUTROPHILS: 2.6 10*3/uL (ref 1.8–8.0)
ABSOLUTE NRBC: 0 10*3/uL (ref 0.00–0.01)
BASOPHILS: 0 % (ref 0–2)
EOSINOPHILS: 0 % (ref 0–5)
HCT: 40 % (ref 35.0–45.0)
HGB: 12.3 g/dL (ref 12.0–16.0)
IMMATURE GRANULOCYTES: 0 % (ref 0.0–0.5)
LYMPHOCYTES: 34 % (ref 21–52)
MCH: 25.3 PG (ref 24.0–34.0)
MCHC: 30.8 g/dL — ABNORMAL LOW (ref 31.0–37.0)
MCV: 82.1 FL (ref 78.0–100.0)
MONOCYTES: 11 % — ABNORMAL HIGH (ref 3–10)
MPV: 11 FL (ref 9.2–11.8)
NEUTROPHILS: 54 % (ref 40–73)
NRBC: 0 PER 100 WBC
PLATELET: 256 10*3/uL (ref 135–420)
RBC: 4.87 M/uL (ref 4.20–5.30)
RDW: 17.1 % — ABNORMAL HIGH (ref 11.6–14.5)
WBC: 4.9 10*3/uL (ref 4.6–13.2)

## 2020-09-22 LAB — SED RATE (ESR): Sed rate, automated: 28 mm/hr (ref 0–30)

## 2020-09-22 LAB — IRON PROFILE
Iron % saturation: 18 % — ABNORMAL LOW (ref 20–50)
Iron: 51 ug/dL (ref 50–175)
TIBC: 284 ug/dL (ref 250–450)

## 2020-09-22 NOTE — Progress Notes (Signed)
Please notify patient that lab results were reviewed and the results are normal.

## 2020-09-26 ENCOUNTER — Ambulatory Visit: Attending: Family | Primary: Family

## 2020-09-26 ENCOUNTER — Ambulatory Visit: Admit: 2020-09-26 | Discharge: 2020-09-26 | Payer: MEDICARE | Attending: Family | Primary: Family

## 2020-09-26 DIAGNOSIS — Z0000 Encounter for general adult medical examination without abnormal findings: Secondary | ICD-10-CM

## 2020-09-26 LAB — AMB POC HEMOGLOBIN A1C: Hemoglobin A1c (POC): 5.9 %

## 2020-09-26 MED ORDER — POTASSIUM CHLORIDE SR 10 MEQ TAB, PARTICLES/CRYSTALS
10 mEq | ORAL_TABLET | Freq: Every day | ORAL | 1 refills | Status: AC
Start: 2020-09-26 — End: ?

## 2020-09-26 MED ORDER — LISINOPRIL-HYDROCHLOROTHIAZIDE 20 MG-25 MG TAB
20-25 mg | ORAL_TABLET | ORAL | 1 refills | Status: AC
Start: 2020-09-26 — End: ?

## 2020-09-26 MED ORDER — CYCLOBENZAPRINE 5 MG TAB
5 mg | ORAL_TABLET | Freq: Three times a day (TID) | ORAL | 1 refills | Status: DC | PRN
Start: 2020-09-26 — End: 2020-11-22

## 2020-09-26 MED ORDER — ATORVASTATIN 20 MG TAB
20 mg | ORAL_TABLET | Freq: Every evening | ORAL | 0 refills | Status: DC
Start: 2020-09-26 — End: 2021-01-03

## 2020-09-26 MED ORDER — FAMOTIDINE 20 MG TAB
20 mg | ORAL_TABLET | Freq: Two times a day (BID) | ORAL | 1 refills | Status: AC
Start: 2020-09-26 — End: ?

## 2020-09-26 NOTE — Progress Notes (Signed)
 Melissa Leblanc presents today for   Chief Complaint   Patient presents with   . Hypertension     follow-up   . Diabetes   . Annual Wellness Visit       Is someone accompanying this pt? no    Is the patient using any DME equipment during OV? no    Depression Screening:  3 most recent PHQ Screens 11/25/2019   Little interest or pleasure in doing things Not at all   Feeling down, depressed, irritable, or hopeless Not at all   Total Score PHQ 2 0   Trouble falling or staying asleep, or sleeping too much -   Feeling tired or having little energy -   Poor appetite, weight loss, or overeating -   Feeling bad about yourself - or that you are a failure or have let yourself or your family down -   Trouble concentrating on things such as school, work, reading, or watching TV -   Moving or speaking so slowly that other people could have noticed; or the opposite being so fidgety that others notice -   Thoughts of being better off dead, or hurting yourself in some way -   PHQ 9 Score -   How difficult have these problems made it for you to do your work, take care of your home and get along with others -       Learning Assessment:  Learning Assessment 03/06/2016   PRIMARY LEARNER Patient   HIGHEST LEVEL OF EDUCATION - PRIMARY LEARNER  -   BARRIERS PRIMARY LEARNER NONE   PRIMARY LANGUAGE ENGLISH   LEARNER PREFERENCE PRIMARY LISTENING   ANSWERED BY self   RELATIONSHIP SELF       Health Maintenance reviewed and discussed and ordered per Provider.    Health Maintenance Due   Topic Date Due   . Hepatitis C Screening  Never done   . Pneumococcal 0-64 years (1 of 4 - PCV13) Never done   . COVID-19 Vaccine (1) Never done   . DTaP/Tdap/Td series (1 - Tdap) Never done   . Shingrix Vaccine Age 27> (1 of 2) Never done   . A1C test (Diabetic or Prediabetic)  12/19/2017   . Medicare Yearly Exam  01/06/2020   .      Coordination of Care:  1. Have you been to the ER, urgent care clinic since your last visit? Hospitalized since your last visit?  no    2. Have you seen or consulted any other health care providers outside of the [facility] since your last visit? Include any pap smears or colon screening. no

## 2020-09-26 NOTE — Progress Notes (Signed)
Memori Sammon is a 55 y.o. female presenting today for Hypertension (follow-up), Diabetes, and Annual Wellness Visit  .     Chief Complaint   Patient presents with   ??? Hypertension     follow-up   ??? Diabetes   ??? Annual Wellness Visit       HPI:  Yareth Kearse presents to the office today for annual wellness visit, hypertension prediabetes follow-up care.  Patient also has medical diagnosis for GERD and is prescribed omeprazole daily.  Hypertension-BP is mildly elevated today at 141/60 and repeat 140/70.  She is prescribed lisinopril with hydrochlorothiazide daily.  She admits she did not take her hypertensive medication today.  She denies any side effects or adverse reaction to the treatment plan.    Prediabetes-patient currently not prescribed any antihyperglycemic agents.  Her hemoglobin A1c today is 5.9.  She is negative for any polyuria or polydipsia.    The patient she had 2 episodes of right upper extremity weakness.  The episodes occurred approximately 7 to 10 days ago.  She was holding her fall and all of a sudden her phone dropped out of her hand and her left arm felt very heavy.  She had to support her left arm with right hand.  She denied any slurred speech or difficulty with gait.  She notes the episode lasted approximately 5 to 10 minutes and resolved.    Patient also is complaining of right thigh pain with ambulating.  Per the patient she is able to walk to the store but generally on the way back she gets to sharp shooting pains in her right upper thigh.  Pain usually resolves with rest.  Notes intermittently she has sharp shooting pains with sitting.  Previously had a history of tobacco abuse but quit smoking approximately 5 months ago.    Review of Systems   Constitutional: Negative for malaise/fatigue.   Respiratory: Negative for cough and shortness of breath.    Cardiovascular: Negative for chest pain, palpitations and leg swelling.   Gastrointestinal: Negative for abdominal pain, nausea and vomiting.    Genitourinary: Negative for dysuria.   Musculoskeletal: Negative for back pain and myalgias.   Neurological: Negative for dizziness and headaches.         Allergies   Allergen Reactions   ??? Vicodin [Hydrocodone-Acetaminophen] Itching       PHQ Screening   3 most recent PHQ Screens 11/25/2019   Little interest or pleasure in doing things Not at all   Feeling down, depressed, irritable, or hopeless Not at all   Total Score PHQ 2 0   Trouble falling or staying asleep, or sleeping too much -   Feeling tired or having little energy -   Poor appetite, weight loss, or overeating -   Feeling bad about yourself - or that you are a failure or have let yourself or your family down -   Trouble concentrating on things such as school, work, reading, or watching TV -   Moving or speaking so slowly that other people could have noticed; or the opposite being so fidgety that others notice -   Thoughts of being better off dead, or hurting yourself in some way -   PHQ 9 Score -   How difficult have these problems made it for you to do your work, take care of your home and get along with others -       History  Past Medical History:   Diagnosis Date   ??? Anxiety    ???  Arthritis 10/17/2015   ??? Asthma    ??? Bladder incontinence    ??? Cancer [facility])     lymph node cancer   ??? Depression 08/17/2014   ??? Diabetes (HCC)     border line diabetes   ??? GERD (gastroesophageal reflux disease)    ??? Hypertension    ??? Lymphoma ([place])    ??? Neck mass    ??? Prediabetes    ??? VAIN I (vaginal intraepithelial neoplasia grade I)        Past Surgical History:   Procedure Laterality Date   ??? HX GYN      BTL   ??? HX HYSTERECTOMY  2004   ??? HX OTHER SURGICAL      lymph node biopsies neck   ??? HX VASCULAR ACCESS      double right chest mediport   ??? PR RMVL TUN CTR VAD W/SUBQ PORT/PMP CTR/PRPH INSJ N/A 03/14/2016    Dr. Jimmye Norman       Social History     Socioeconomic History   ??? Marital status: SINGLE     Spouse name: Not on file   ??? Number of children: Not on file   ??? Years of  education: Not on file   ??? Highest education level: Not on file   Occupational History   ??? Not on file   Tobacco Use   ??? Smoking status: Current Every Day Smoker     Packs/day: 0.50     Years: 28.00     Pack years: 14.00   ??? Smokeless tobacco: Never Used   Substance and Sexual Activity   ??? Alcohol use: Not Currently   ??? Drug use: Not Currently     Types: Marijuana     Comment: one or 2 puffs occasionally (none now as of 03/11/16 per patient)   ??? Sexual activity: Never   Other Topics Concern   ??? Not on file   Social History Narrative   ??? Not on file     Social Determinants of Health     Financial Resource Strain:    ??? Difficulty of Paying Living Expenses: Not on file   Food Insecurity:    ??? Worried About Running Out of Food in the Last Year: Not on file   ??? Ran Out of Food in the Last Year: Not on file   Transportation Needs:    ??? Lack of Transportation (Medical): Not on file   ??? Lack of Transportation (Non-Medical): Not on file   Physical Activity:    ??? Days of Exercise per Week: Not on file   ??? Minutes of Exercise per Session: Not on file   Stress:    ??? Feeling of Stress : Not on file   Social Connections:    ??? Frequency of Communication with Friends and Family: Not on file   ??? Frequency of Social Gatherings with Friends and Family: Not on file   ??? Attends Religious Services: Not on file   ??? Active Member of Clubs or Organizations: Not on file   ??? Attends Archivist Meetings: Not on file   ??? Marital Status: Not on file   Intimate Partner Violence:    ??? Fear of Current or Ex-Partner: Not on file   ??? Emotionally Abused: Not on file   ??? Physically Abused: Not on file   ??? Sexually Abused: Not on file   Housing Stability:    ??? Unable to Pay for Housing in the Last Year: Not  on file   ??? Number of Places Lived in the Last Year: Not on file   ??? Unstable Housing in the Last Year: Not on file       Current Outpatient Medications   Medication Sig Dispense Refill   ??? cyclobenzaprine (Flexeril) 5 mg tablet Take 1  Tablet by mouth three (3) times daily as needed for Muscle Spasm(s). 30 Tablet 1   ??? lisinopril-hydroCHLOROthiazide (PRINZIDE, ZESTORETIC) 20-25 mg per tablet take 1 tablet by mouth once daily  Indications: high blood pressure 90 Tablet 1   ??? potassium chloride (KLOR-CON M10) 10 mEq tablet Take 1 Tablet by mouth daily. Indications: prevention of low potassium in the blood 90 Tablet 1   ??? famotidine (PEPCID) 20 mg tablet Take 1 Tablet by mouth two (2) times a day. 60 Tablet 1   ??? atorvastatin (LIPITOR) 20 mg tablet Take 1 Tablet by mouth nightly. 90 Tablet 0         Vitals:    09/26/20 1358 09/26/20 1452   BP: (!) 141/60 (!) 140/70   Pulse: 71    Resp: 16    Temp: 97.5 ??F (36.4 ??C)    TempSrc: Oral    SpO2: 99%    Weight: 221 lb (100.2 kg)    Height: _0  (1.651 m)    PainSc:   0 - No pain        Physical Exam  Vitals and nursing note reviewed.   Constitutional:       Appearance: Normal appearance.   Cardiovascular:      Rate and Rhythm: Normal rate and regular rhythm.      Pulses: Normal pulses.      Heart sounds: Normal heart sounds.   Pulmonary:      Effort: Pulmonary effort is normal.      Breath sounds: Normal breath sounds.   Abdominal:      General: Bowel sounds are normal.      Palpations: Abdomen is soft.   Musculoskeletal:      Cervical back: Normal range of motion and neck supple.   Skin:     General: Skin is warm and dry.   Neurological:      General: No focal deficit present.      Mental Status: She is alert and oriented to person, place, and time.      Motor: No weakness.      Coordination: Coordination normal.      Gait: Gait normal.         Office Visit on 09/26/2020   Component Date Value Ref Range Status   ??? Hemoglobin A1c (POC) 09/26/2020 5.9  % Preliminary   Hospital Outpatient Visit on 09/22/2020   Component Date Value Ref Range Status   ??? Sodium 09/22/2020 138  136 - 145 mmol/L Final   ??? Potassium 09/22/2020 4.5  3.5 - 5.5 mmol/L Final   ??? Chloride 09/22/2020 106  100 - 111 mmol/L Final   ??? CO2  09/22/2020 27  21 - 32 mmol/L Final   ??? Anion gap 09/22/2020 5  3.0 - 18 mmol/L Final   ??? Glucose 09/22/2020 95  74 - 99 mg/dL Final   ??? BUN 09/22/2020 11  7.0 - 18 MG/DL Final   ??? Creatinine 09/22/2020 0.85  0.6 - 1.3 MG/DL Final   ??? BUN/Creatinine ratio 09/22/2020 13  12 - 20   Final   ??? GFR est AA 09/22/2020 >60  >60 ml/min/1.27m Final   ??? GFR est  non-AA 09/22/2020 >60  >60 ml/min/1.8m Final    Comment: (NOTE)  Estimated GFR is calculated using the Modification of Diet in Renal   Disease (MDRD) Study equation, reported for both African Americans   (GFRAA) and non-African Americans (GFRNA), and normalized to 1.745m  body surface area. The physician must decide which value applies to   the patient. The MDRD study equation should only be used in   individuals age 2021r older. It has not been validated for the   following: pregnant women, patients with serious comorbid conditions,   or on certain medications, or persons with extremes of body size,   muscle mass, or nutritional status.     ??? Calcium 09/22/2020 9.7  8.5 - 10.1 MG/DL Final   ??? Bilirubin, total 09/22/2020 0.4  0.2 - 1.0 MG/DL Final   ??? ALT (SGPT) 09/22/2020 37  13 - 56 U/L Final   ??? AST (SGOT) 09/22/2020 28  10 - 38 U/L Final   ??? Alk. phosphatase 09/22/2020 63  45 - 117 U/L Final   ??? Protein, total 09/22/2020 8.1  6.4 - 8.2 g/dL Final   ??? Albumin 09/22/2020 3.7  3.4 - 5.0 g/dL Final   ??? Globulin 09/22/2020 4.4* 2.0 - 4.0 g/dL Final   ??? A-G Ratio 09/22/2020 0.8  0.8 - 1.7   Final   ??? Ferritin 09/22/2020 201  8 - 388 NG/ML Final   ??? Iron 09/22/2020 51  50 - 175 ug/dL Final    Patients receiving metal-binding drugs (e.g. deferoxamine) may show spuriously depressed iron values, as chelated iron may not properly react in the iron assay.   ??? TIBC 09/22/2020 284  250 - 450 ug/dL Final   ??? Iron % saturation 09/22/2020 18* 20 - 50 % Final   ??? Sed rate, automated 09/22/2020 28  0 - 30 mm/hr Final   ??? WBC 09/22/2020 4.9  4.6 - 13.2 K/uL Final   ??? RBC 09/22/2020  4.87  4.20 - 5.30 M/uL Final   ??? HGB 09/22/2020 12.3  12.0 - 16.0 g/dL Final   ??? HCT 09/22/2020 40.0  35.0 - 45.0 % Final   ??? MCV 09/22/2020 82.1  78.0 - 100.0 FL Final   ??? MCH 09/22/2020 25.3  24.0 - 34.0 PG Final   ??? MCHC 09/22/2020 30.8* 31.0 - 37.0 g/dL Final   ??? RDW 09/22/2020 17.1* 11.6 - 14.5 % Final   ??? PLATELET 09/22/2020 256  135 - 420 K/uL Final   ??? MPV 09/22/2020 11.0  9.2 - 11.8 FL Final   ??? NRBC 09/22/2020 0.0  0 PER 100 WBC Final   ??? ABSOLUTE NRBC 09/22/2020 0.00  0.00 - 0.01 K/uL Final   ??? NEUTROPHILS 09/22/2020 54  40 - 73 % Final   ??? LYMPHOCYTES 09/22/2020 34  21 - 52 % Final   ??? MONOCYTES 09/22/2020 11* 3 - 10 % Final   ??? EOSINOPHILS 09/22/2020 0  0 - 5 % Final   ??? BASOPHILS 09/22/2020 0  0 - 2 % Final   ??? IMMATURE GRANULOCYTES 09/22/2020 0  0.0 - 0.5 % Final   ??? ABS. NEUTROPHILS 09/22/2020 2.6  1.8 - 8.0 K/UL Final   ??? ABS. LYMPHOCYTES 09/22/2020 1.7  0.9 - 3.6 K/UL Final   ??? ABS. MONOCYTES 09/22/2020 0.5  0.05 - 1.2 K/UL Final   ??? ABS. EOSINOPHILS 09/22/2020 0.0  0.0 - 0.4 K/UL Final   ??? ABS. BASOPHILS 09/22/2020 0.0  0.0 - 0.1  K/UL Final   ??? ABS. IMM. GRANS. 09/22/2020 0.0  0.00 - 0.04 K/UL Final   ??? DF 09/22/2020 AUTOMATED    Final       Results for orders placed or performed in visit on 09/26/20   AMB POC HEMOGLOBIN A1C   Result Value Ref Range    Hemoglobin A1c (POC) 5.9 %       Patient Care Team:  Patient Care Team:  Candie Echevaria, NP as PCP - General (Nurse Practitioner)  Candie Echevaria, NP as PCP - [facility] Empaneled Provider  Suzy Bouchard, MD (Oncology)      Assessment / Plan:      ICD-10-CM ICD-9-CM    1. Essential hypertension  I10 401.9 lisinopril-hydroCHLOROthiazide (PRINZIDE, ZESTORETIC) 20-25 mg per tablet      potassium chloride (KLOR-CON M10) 10 mEq tablet      LIPID PANEL   2. Prediabetes  R73.03 790.29 AMB POC HEMOGLOBIN A1C      atorvastatin (LIPITOR) 20 mg tablet   3. Muscle spasm  M62.838 728.85 cyclobenzaprine (Flexeril) 5 mg tablet   4. Gastroesophageal reflux  disease  K21.9 530.81 famotidine (PEPCID) 20 mg tablet   5. Medicare annual wellness visit, subsequent  Z00.00 V70.0    6. Severe obesity (BMI 35.0-39.9) with comorbidity ([place])  E66.01 278.01    7. Colon cancer screening  Z12.11 V76.51 REFERRAL TO GASTROENTEROLOGY   8. Upper extremity weakness  R29.898 729.89 DUPLEX CAROTID BILATERAL      REFERRAL TO NEUROLOGY      CT HEAD W WO CONT      atorvastatin (LIPITOR) 20 mg tablet   9. Paresthesia  R20.2 782.0 REFERRAL TO NEUROLOGY      CT HEAD W WO CONT   10. Claudication of right lower extremity (HCC)  I73.9 443.9 REFERRAL TO VASCULAR SURGERY     Paresthesia/extremity weakness-carotid duplex, CT of the head, referral to neurology and atorvastatin ordered  Follow-up and Dispositions    ?? Return in about 3 months (around 12/24/2020).     Right lower extremity claudication when ambulating-referral to vascular  Medication refill    I asked the patient if she  had any questions and answered her  questions.  The patient stated that she understands the treatment plan and agrees with the treatment plan    This document was created with a voice activated dictation system and may contain transcription errors.

## 2020-09-26 NOTE — Progress Notes (Signed)
This is the Subsequent Medicare Annual Wellness Exam, performed 12 months or more after the Initial AWV or the last Subsequent AWV    I have reviewed the patient's medical history in detail and updated the computerized patient record.       Assessment/Plan   Education and counseling provided:  Are appropriate based on today's review and evaluation    1. Prediabetes  -     AMB POC HEMOGLOBIN A1C  2. Muscle spasm  The following orders have not been finalized:  -     cyclobenzaprine (Flexeril) 5 mg tablet  3. Essential hypertension  The following orders have not been finalized:  -     lisinopril-hydroCHLOROthiazide (PRINZIDE, ZESTORETIC) 20-25 mg per tablet  -     potassium chloride (KLOR-CON M10) 10 mEq tablet  4. Gastroesophageal reflux disease  The following orders have not been finalized:  -     Omeprazole delayed release (PRILOSEC D/R) 20 mg tablet  5. Medicare annual wellness visit, subsequent       Depression Risk Factor Screening     3 most recent PHQ Screens 11/25/2019   Little interest or pleasure in doing things Not at all   Feeling down, depressed, irritable, or hopeless Not at all   Total Score PHQ 2 0   Trouble falling or staying asleep, or sleeping too much -   Feeling tired or having little energy -   Poor appetite, weight loss, or overeating -   Feeling bad about yourself - or that you are a failure or have let yourself or your family down -   Trouble concentrating on things such as school, work, reading, or watching TV -   Moving or speaking so slowly that other people could have noticed; or the opposite being so fidgety that others notice -   Thoughts of being better off dead, or hurting yourself in some way -   PHQ 9 Score -   How difficult have these problems made it for you to do your work, take care of your home and get along with others -       Alcohol & Drug Abuse Risk Screen    Do you average more than 1 drink per night or more than 7 drinks a week:  Yes    On any one occasion in the past  three months have you have had more than 3 drinks containing alcohol:  Yes          Functional Ability and Level of Safety    Hearing: Hearing is good.      Activities of Daily Living:  The home contains: no safety equipment.  Patient does total self care      Ambulation: with no difficulty     Fall Risk:  Fall Risk Assessment, last 12 mths 01/05/2019   Able to walk? Yes   Fall in past 12 months? No      Abuse Screen:  Patient is not abused       Cognitive Screening    Has your family/caregiver stated any concerns about your memory: no     Cognitive Screening: Normal - MMSE (Mini Mental Status Exam)    Health Maintenance Due     Health Maintenance Due   Topic Date Due   ??? Hepatitis C Screening  Never done   ??? Pneumococcal 0-64 years (1 of 4 - PCV13) Never done   ??? COVID-19 Vaccine (1) Never done   ??? DTaP/Tdap/Td series (1 - Tdap)  Never done   ??? Shingrix Vaccine Age 12> (1 of 2) Never done   ??? A1C test (Diabetic or Prediabetic)  12/19/2017       Patient Care Team   Patient Care Team:  Candie Echevaria, NP as PCP - General (Nurse Practitioner)  Candie Echevaria, NP as PCP - [facility] Empaneled Provider  Suzy Bouchard, MD (Oncology)    History     Patient Active Problem List   Diagnosis Code   ??? Prediabetes R73.03   ??? Essential hypertension I10   ??? Urge incontinence N39.41   ??? 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 Back pain M54.9   ??? Advance directive discussed with patient Z71.89   ??? Chronic anemia D64.9   ??? Arthritis M19.90   ??? 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 Pedal edema R60.0   ??? Class 1 obesity due to excess calories with serious comorbidity and body mass index (BMI) of 33.0 to 33.9 in adult E66.09, Z68.33   ??? Severe obesity (HCC) E66.01   ??? Tobacco abuse Z72.0     Past Medical History:   Diagnosis Date   ??? Anxiety    ??? Arthritis 10/17/2015   ??? Asthma    ??? Bladder incontinence    ??? Cancer [facility])     lymph node cancer   ??? Depression 08/17/2014   ??? Diabetes (HCC)     border line  diabetes   ??? GERD (gastroesophageal reflux disease)    ??? Hypertension    ??? Lymphoma ([place])    ??? Neck mass    ??? Prediabetes    ??? VAIN I (vaginal intraepithelial neoplasia grade I)       Past Surgical History:   Procedure Laterality Date   ??? HX GYN      BTL   ??? HX HYSTERECTOMY  2004   ??? HX OTHER SURGICAL      lymph node biopsies neck   ??? HX VASCULAR ACCESS      double right chest mediport   ??? PR RMVL TUN CTR VAD W/SUBQ PORT/PMP CTR/PRPH INSJ N/A 03/14/2016    Dr. Jimmye Norman     Current Outpatient Medications   Medication Sig Dispense Refill   ??? potassium chloride (KLOR-CON) 10 mEq tablet Take 1 Tab by mouth daily. Indications: prevention of low potassium in the blood 90 Tab 1   ??? lisinopril-hydroCHLOROthiazide (PRINZIDE, ZESTORETIC) 20-25 mg per tablet take 1 tablet by mouth once daily  Indications: high blood pressure 90 Tab 1   ??? cyclobenzaprine (Flexeril) 5 mg tablet Take 1 Tab by mouth three (3) times daily as needed for Muscle Spasm(s). 30 Tab 1   ??? Omeprazole delayed release (PRILOSEC D/R) 20 mg tablet Take 1 Tab by mouth daily. 30 Tab 2     Allergies   Allergen Reactions   ??? Vicodin [Hydrocodone-Acetaminophen] Itching       Family History   Problem Relation Age of Onset   ??? Cancer Mother         throat   ??? Breast Cancer Mother    ??? Colon Cancer Mother    ??? Hypertension Mother    ??? Colon Cancer Father    ??? Pacemaker Father    ??? Cancer Sister         thigh that moved to lung, sounds like myosarcoma?   ??? Hypertension Sister    ??? Alcohol abuse Neg Hx    ??? Arthritis-rheumatoid Neg Hx    ???  Asthma Neg Hx    ??? Bleeding Prob Neg Hx    ??? Diabetes Neg Hx    ??? Elevated Lipids Neg Hx    ??? Headache Neg Hx    ??? Lung Disease Neg Hx    ??? Migraines Neg Hx    ??? Psychiatric Disorder Neg Hx    ??? Stroke Neg Hx    ??? Mental Retardation Neg Hx      Social History     Tobacco Use   ??? Smoking status: Current Every Day Smoker     Packs/day: 0.50     Years: 28.00     Pack years: 14.00   ??? Smokeless tobacco: Never Used   Substance Use Topics    ??? Alcohol use: Not Currently         Melissa Leblanc

## 2020-09-29 ENCOUNTER — Ambulatory Visit: Payer: MEDICARE | Attending: Internal Medicine | Primary: Family

## 2020-10-04 ENCOUNTER — Telehealth: Attending: Internal Medicine | Primary: Family

## 2020-10-04 ENCOUNTER — Telehealth: Admit: 2020-10-04 | Discharge: 2020-10-06 | Payer: MEDICARE | Attending: Internal Medicine | Primary: Family

## 2020-10-04 DIAGNOSIS — C8191 Hodgkin lymphoma, unspecified, lymph nodes of head, face, and neck: Secondary | ICD-10-CM

## 2020-10-04 MED ORDER — FERROUS SULFATE ER 142 MG (45 MG IRON) TAB
142 mg (45 mg iron) | ORAL_TABLET | Freq: Every day | ORAL | 3 refills | Status: AC
Start: 2020-10-04 — End: 2020-11-03

## 2020-10-04 NOTE — Progress Notes (Signed)
Melissa Leblanc is a 55 y.o. female, evaluated via audio-only technology on 10/04/2020 for Follow-up  .    Assessment & Plan:   Diagnoses and all orders for this visit:    1. 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 Iron deficiency anemia due to dietary causes    Other orders  -     ferrous sulfate (SLOW FE) 142 mg (45 mg iron) ER tablet; Take 1 Tablet by mouth Daily (before breakfast) for 30 days.      The complexity of medical decision making for this visit is moderate.    Hodgkin's lymphoma, IIB:  -- She has previously completed a full course of systemic chemotherapy with a combination of ABVD,  In 2014. Patient was being followed by Dr. Nadyne Coombes who retired, last seen was 07/14/2018.  -- On 07/25/2018 CT chest abdomen and pelvis showed negative for the findings of recurrent lymphoma.  -- Clinical stable. She has follow up her PCP recently for right upper extremity weakness. She was ordered CT head and referred to Neurology, now pending appointments.  -- Today I have reviewed with the patient about recent lab reports.   09/22/2020 CBC reported hemoglobin 12.3, hematocrit 40%, WBC 4.9, platelet 256,000.  Iron saturation 18%, ferritin 201.    Plan:  -- The patient was advised to notify us if any skin lumps or bumps, swollen lymph nodes, night sweat, unintentional weight loss, worsen fatigue, new bone pain or back pain, or any concerns.  -- I have advised the patient to follow up PCP to continue age-appropriate cancer screening.  -- I have educated patient regarding lifestyle modifications, minimizing alcohol intake, refraining from smoking, healthy diet, and physical activity.  ??  ??  Iron deficiency anemia  Chronic anemia:  Leukopenia:   -- 05/11/2020 CBC reported hemoglobin 12.2, hematocrit 39.1%, WBC 5.1, and platelet 246,000.    Iron profile showed saturation 18% and ferritin 81.  -- Today I have reviewed with the patient about recent lab reports.   09/22/2020 CBC reported hemoglobin 12.3,  hematocrit 40%, WBC 4.9, platelet 256,000.  Iron saturation 18%, ferritin 201.  -- The patient was advised to continue iron pills.   -- She will f/u PCP/GI for colonoscopy screening as indicated.  ??  -- We will see the patient in about 4 months. Always sooner if required    12  Subjective:   Melissa Leblanc is a 55 year old African American woman has a history of Hodgkin's lymphoma. She has previously completed a full course of systemic chemotherapy with a combination of ABVD,  In 2014. Patient was being followed by Dr. Nadyne Coombes who retired, last seen was 07/14/2018.  She has follow up her PCP recently for right upper extremity weakness. She was ordered CT head and referred to Neurology, now pending appointments.  Otherwise she denied any other concerns or complaints at this time.  She follow-ups with her primary care physician for smoking cessation. Denied fever, chills, night sweat, unintentional weight loss, skin lumps or bumps, acute bleeding or bruising issues. No acute bleeding, blood in stool, dark stool, melena, hematochezia, hemoptysis, dark urine, or easily bruising. Denied headache, acute vision change, dizziness, chest pain, worsen shortness of breath, palpitation, productive cough, nausea, vomiting, abdominal pain, altered bowel habits, dysuria.            Prior to Admission medications    Medication Sig Start Date End Date Taking? Authorizing Provider   cyclobenzaprine (Flexeril) 5 mg tablet Take  1 Tablet by mouth three (3) times daily as needed for Muscle Spasm(s). 09/26/20   Candie Echevaria, NP   lisinopril-hydroCHLOROthiazide (PRINZIDE, ZESTORETIC) 20-25 mg per tablet take 1 tablet by mouth once daily  Indications: high blood pressure 09/26/20   Candie Echevaria, NP   potassium chloride (KLOR-CON M10) 10 mEq tablet Take 1 Tablet by mouth daily. Indications: prevention of low potassium in the blood 09/26/20   Candie Echevaria, NP   famotidine (PEPCID) 20 mg tablet Take 1 Tablet by mouth two (2)  times a day. 09/26/20   Candie Echevaria, NP   atorvastatin (LIPITOR) 20 mg tablet Take 1 Tablet by mouth nightly. 09/26/20   Candie Echevaria, NP     Patient Active Problem List   Diagnosis Code   ??? Prediabetes R73.03   ??? Essential hypertension I10   ??? Urge incontinence N39.41   ??? 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 Back pain M54.9   ??? Advance directive discussed with patient Z71.89   ??? Chronic anemia D64.9   ??? Arthritis M19.90   ??? 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 Pedal edema R60.0   ??? Class 1 obesity due to excess calories with serious comorbidity and body mass index (BMI) of 33.0 to 33.9 in adult E66.09, Z68.33   ??? Severe obesity (HCC) E66.01   ??? Tobacco abuse Z72.0       Review of Systems   Constitutional: Negative for chills, diaphoresis, fever, malaise/fatigue and weight loss.   Respiratory: Negative for cough, hemoptysis, shortness of breath and wheezing.    Cardiovascular: Negative for chest pain, palpitations and leg swelling.   Gastrointestinal: Negative for abdominal pain, diarrhea, heartburn, nausea and vomiting.   Genitourinary: Negative for dysuria, frequency, hematuria and urgency.   Musculoskeletal: Negative for joint pain and myalgias.   Skin: Negative for itching and rash.   Neurological: Negative for dizziness, seizures, weakness and headaches.   Psychiatric/Behavioral: Negative for depression. The patient does not have insomnia.        Patient-Reported Vitals 10/04/2020   Patient-Reported Weight 221lb         The patient was advised that our priority at this time is to keep the patients safe, and therefore we are reaching out to patients virtually to prevent them coming into the office unless necessarily.  Patient verbalized understanding and was agreeable for virtual visit.    [name], who was evaluated through a patient-initiated, synchronous (real-time) audio only encounter, and/or her healthcare decision maker, is aware that it is a billable  service, with coverage as determined by her insurance carrier. She provided verbal consent to proceed: Yes. She has not had a related appointment within my department in the past 7 days or scheduled within the next 24 hours.    I have spent 25 minutes reviewing previous notes, test results and discussing the diagnosis and importance of compliance with the treatment plan as well as documenting on the day of the visit.    Flossie Buffy, MD

## 2020-10-10 ENCOUNTER — Inpatient Hospital Stay: Payer: MEDICARE | Attending: Family | Primary: Family

## 2020-10-13 ENCOUNTER — Ambulatory Visit: Payer: MEDICARE | Attending: Surgical | Primary: Family

## 2020-10-19 ENCOUNTER — Ambulatory Visit: Payer: MEDICAID | Primary: Family

## 2020-10-20 ENCOUNTER — Encounter: Attending: Nurse Practitioner | Primary: Family

## 2020-10-26 ENCOUNTER — Ambulatory Visit: Payer: MEDICARE | Primary: Family

## 2020-10-26 ENCOUNTER — Inpatient Hospital Stay: Payer: MEDICARE | Attending: Family | Primary: Family

## 2020-10-30 ENCOUNTER — Ambulatory Visit: Payer: MEDICARE | Attending: Neurology | Primary: Family

## 2020-10-30 ENCOUNTER — Ambulatory Visit: Payer: MEDICARE | Attending: Nurse Practitioner | Primary: Family

## 2020-11-20 ENCOUNTER — Encounter

## 2020-11-22 MED ORDER — CYCLOBENZAPRINE 5 MG TAB
5 mg | ORAL_TABLET | Freq: Three times a day (TID) | ORAL | 1 refills | Status: DC | PRN
Start: 2020-11-22 — End: 2021-01-05

## 2020-12-20 ENCOUNTER — Encounter: Attending: Family | Primary: Family

## 2020-12-26 ENCOUNTER — Encounter: Attending: Family | Primary: Family

## 2020-12-29 ENCOUNTER — Inpatient Hospital Stay
Admit: 2020-12-29 | Discharge: 2020-12-29 | Disposition: A | Discharge status: Home / Self Care | Payer: MEDICARE | Attending: Emergency Medicine

## 2020-12-29 DIAGNOSIS — B349 Viral infection, unspecified: Secondary | ICD-10-CM

## 2020-12-29 MED ORDER — FLUTICASONE 50 MCG/ACTUATION NASAL SPRAY, SUSP
50 mcg/actuation | Freq: Every day | NASAL | 0 refills | Status: AC
Start: 2020-12-29 — End: ?

## 2020-12-29 MED ORDER — DEXAMETHASONE 4 MG TAB
4 mg | ORAL_TABLET | ORAL | 0 refills | Status: AC
Start: 2020-12-29 — End: ?

## 2020-12-29 NOTE — ED Notes (Signed)
 Went over discharge instructions    Pointed out where the My Chart instructions were located    Answered questions again regarding quarantine    Patient verbalized understanding and left with discharge instructions

## 2020-12-29 NOTE — ED Provider Notes (Signed)
ED Provider Notes by Dawayne Patricia, PA at 12/29/20 1411                Author: Dawayne Patricia, PA  Service: Emergency Medicine  Author Type: Physician Assistant       Filed: 12/29/20 2023  Date of Service: 12/29/20 1411  Status: Attested           Editor: Dawayne Patricia, PA (Physician Assistant)  Cosigner: Joice Lofts, MD at 01/02/21 1737          Attestation signed by Joice Lofts, MD at 01/02/21 1737          I, Dr. Sedonia Small, have personally seen and examined this patient; I have fully participated in the care of this patient with the advanced practice provider.   I have reviewed and agree with all pertinent clinical information including history, physical exam,  and the plan.  I have also reviewed and agree with the medications, allergies and past medical history sections for this patient.  I provided a substantive  portion of the care of this patient.  I personally performed the medical decision making in its entirety, for this encounter.   Patient presents with complaints with congestion and cough, nontoxic-appearing on exam oxygen saturations are excellent, outpatient  swab will be obtained, patient be treated symptomatically with follow-up recommended return precautions discussed.    Joice Lofts, [place]   Emergency Department Treatment Report          Patient: Melissa Leblanc  Age: 55 y.o.  Sex: female          Date of Birth: [birth year]  Admit Date: 12/29/2020  PCP: Candie Echevaria, NP     MRN: [id number]   CSN: [id number]   Attending: Joice Lofts, MD         Room: [phone]  Time Dictated: 2:11 PM  APP: Dawayne Patricia, PA           Chief Complaint      Chief Complaint       Patient presents with        ?  Sinus Infection        ?  Cough             History of Present Illness        55 y.o. female presents with concern  for  COVID-19 with associated symptoms of nasal congestion, cough for the last 5 days.   She questions if she has a sinus infection but also wanted to test for COVID as she is not vaccinated.  She denies known COVID-19 contacts.  She has taken over-the-counter  cough and cold medication with little relief. Denies chest pain, shortness of breath, abdominal pain, nausea, vomiting, any other symptoms.         Review of Systems        Constitutional: No fever, chills, or weight loss   Eyes: No visual symptoms.   ENT: No sore throat, runny nose or ear pain.   Respiratory: No cough, dyspnea or wheezing.   Cardiovascular: No chest pain, pressure, palpitations, tightness or heaviness.   Gastrointestinal: No vomiting, diarrhea or abdominal pain.   Genitourinary: No dysuria, frequency, or urgency.   Musculoskeletal: No joint pain or swelling.  Integumentary: No rashes.   Neurological: No headaches, sensory or motor symptoms.   Denies complaints in all other systems.           Past Medical/Surgical History          Past Medical History:        Diagnosis  Date         ?  Anxiety       ?  Arthritis  10/17/2015     ?  Asthma       ?  Bladder incontinence       ?  Cancer [facility])            lymph node cancer         ?  Depression  08/17/2014     ?  Diabetes (HCC)            border line diabetes         ?  GERD (gastroesophageal reflux disease)       ?  Hypertension       ?  Lymphoma (HCC)       ?  Neck mass       ?  Prediabetes           ?  VAIN I (vaginal intraepithelial neoplasia grade I)            Past Surgical History:         Procedure  Laterality  Date          ?  HX GYN              BTL          ?  HX HYSTERECTOMY    2004     ?  HX OTHER SURGICAL              lymph node biopsies neck          ?  HX VASCULAR ACCESS              double right chest mediport          ?  PR RMVL TUN CTR VAD W/SUBQ PORT/PMP CTR/PRPH INSJ  N/A  03/14/2016          Dr. Mayford Knife             Social History          Social History          Socioeconomic History         ?  Marital status:  SINGLE       Tobacco Use         ?  Smoking status:   Current Every Day Smoker              Packs/day:  0.50         Years:  28.00         Pack years:  14.00         ?  Smokeless tobacco:  Never Used       Substance and Sexual Activity         ?  Alcohol use:  Not Currently     ?  Drug use:  Not Currently              Types:  Marijuana             Comment: one or 2 puffs occasionally (none now as of 03/11/16 per patient)         ?  Sexual activity:  Never             Family History          Family History         Problem  Relation  Age of Onset          ?  Cancer  Mother                throat          ?  Breast Cancer  Mother       ?  Colon Cancer  Mother       ?  Hypertension  Mother       ?  Colon Cancer  Father       ?  Pacemaker  Father       ?  Cancer  Sister                thigh that moved to lung, sounds like myosarcoma?          ?  Hypertension  Sister       ?  Alcohol abuse  Neg Hx       ?  Arthritis-rheumatoid  Neg Hx       ?  Asthma  Neg Hx       ?  Bleeding Prob  Neg Hx       ?  Diabetes  Neg Hx       ?  Elevated Lipids  Neg Hx       ?  Headache  Neg Hx       ?  Lung Disease  Neg Hx       ?  Migraines  Neg Hx       ?  Psychiatric Disorder  Neg Hx       ?  Stroke  Neg Hx            ?  Mental Retardation  Neg Hx               Current Medications          Prior to Admission Medications     Prescriptions  Last Dose  Informant  Patient Reported?  Taking?      atorvastatin (LIPITOR) 20 mg tablet      No  No      Sig: Take 1 Tablet by mouth nightly.      cyclobenzaprine (FLEXERIL) 5 mg tablet      No  No      Sig: Take 1 Tablet by mouth three (3) times daily as needed for Muscle Spasm(s).      famotidine (PEPCID) 20 mg tablet      No  No      Sig: Take 1 Tablet by mouth two (2) times a day.      lisinopril-hydroCHLOROthiazide (PRINZIDE, ZESTORETIC) 20-25 mg per tablet      No  No      Sig: take 1 tablet by mouth once daily  Indications: high blood pressure      potassium chloride (KLOR-CON M10) 10 mEq tablet      No  No      Sig: Take 1 Tablet by mouth daily.  Indications: prevention of low potassium in the blood               Facility-Administered Medications: None             Allergies  Allergies        Allergen  Reactions         ?  Vicodin [Hydrocodone-Acetaminophen]  Itching             Physical Exam          ED Triage Vitals     ED Encounter Vitals Group            BP  12/29/20 1302  139/77        Pulse (Heart Rate)  12/29/20 1302  96        Resp Rate  12/29/20 1302  18        Temp  12/29/20 1302  99.4 ??F (37.4 ??C)        Temp src  --          O2 Sat (%)  12/29/20 1302  100 %        Weight  12/29/20 1309  221 lb            Height  --             Constitutional: Patient appears well developed and well nourished.  Appearance and behavior are age and situation appropriate.   HEENT: Conjunctiva clear.  PERRL. Bilateral TM non edematous or erythematous. Mucous membranes moist, non-erythematous. Surface of the pharynx,  palate, and tongue are pink, moist and without lesions.   Neck: supple, non tender, symmetrical, no masses or JVD.    Respiratory: lungs clear to auscultation, nonlabored respirations. No tachypnea or accessory muscle use.   Cardiovascular: heart regular rate and rhythm without murmur rubs or gallops.     Distal pulses 2+ and equal bilaterally.  No peripheral edema.   Gastrointestinal:  Abdomen soft, nontender without complaint of pain to palpation. Normoactive bowel sounds. No peritoneal findings.    Musculoskeletal: No bony tenderness gross abnormalities.   Integumentary: warm and dry without rashes or lesions   Neurologic: alert and oriented, Sensation intact, motor strength equal and symmetric.  No facial asymmetry or dysarthria.                     Impression and Management Plan     55 y.o. female  presenting with nasal congestion cough, runny nose.          This patient was evaluated in the emergency department for symptoms described in the history of present illness. Doubt PNA, sepsis, other serious bacterial infection or acute emergent  condition. Is otherwise well-appearing with acceptable vitals, a reassuring  physical exam, and is safe to discharge home following NP swab. Will add to follow-up list to call with results after. Will provide strict return precautions and instructions on self-isolation/quarantine and anticipatory guidance. They were evaluated  in the context of global COVID-19 pandemic, which necessitated consideration of the patient may be at  risk for infection with the SARS-COV-2 virus that causes COVID-19.  Institutional protocols and algorithms that pertain to the evaluation of patients  at risk for COVID-19 are in a state of rapid change based on the information released by regulatory bodies including the CDC and federal state organizations.  These policies and algorithms were followed during the patient's care in the emergency department.         Will d/c to self-quarantine if studies otherwise negative.          Diagnostic Studies     Lab:    No results found for this or any previous visit (from the past  12 hour(s)).      Imaging:     No results found.              ED Course/Medical Decision Making     Patient remained clinically stable throughout the Emergency Department visit.  Vitals were unremarkable and the patient's condition required no further ED intervention.        Medications - No data to display      Patient isolation    .     Monitor for worsening or development of symptoms of shortness of breath, increased weakness or fever.      This patient was evaluated in the emergency department for symptoms described in the history of present illness.  she was evaluated in the content  of global COVID-19 pandemic, which necessitated consideration of the patient may be at risk for infection with the SARS-COV-2 virus that causes COVID-19.  Institutional protocols and algorithms that pertain to the evaluation of patients at risk for COVID-19  are in a state of rapid change based on the information released by    regulatory  bodies including the CDC and federal state organizations.  These policies and algorithms were followed during the patient's care in the emergency department.          Final Diagnosis                 ICD-10-CM  ICD-9-CM          1.  Acute viral syndrome   B34.9  079.99             Disposition             Discharge Medication List as of 12/29/2020  2:15 PM              START taking these medications          Details        dexAMETHasone (Decadron) 4 mg tablet  Take two tabs po at once, Print, Disp-2 Tablet, R-0               fluticasone propionate (FLONASE) 50 mcg/actuation nasal spray  2 Sprays by Both Nostrils route daily., Print, Disp-16 g, R-0                     CONTINUE these medications which have NOT CHANGED          Details        cyclobenzaprine (FLEXERIL) 5 mg tablet  Take 1 Tablet by mouth three (3) times daily as needed for Muscle Spasm(s)., Normal, Disp-30 Tablet, R-1This prescription was filled on 11/20/2020. Any refills  authorized will be placed on file.               lisinopril-hydroCHLOROthiazide (PRINZIDE, ZESTORETIC) 20-25 mg per tablet  take 1 tablet by mouth once daily  Indications: high blood pressure, Normal, Disp-90 Tablet, R-1               potassium chloride (KLOR-CON M10) 10 mEq tablet  Take 1 Tablet by mouth daily. Indications: prevention of low potassium in the blood, Normal, Disp-90 Tablet, R-1               famotidine (PEPCID) 20 mg tablet  Take 1 Tablet by mouth two (2) times a day., Normal, Disp-60 Tablet, R-1               atorvastatin (LIPITOR) 20 mg tablet  Take 1 Tablet by mouth nightly., Normal, Disp-90  Tablet, R-0                         D/C home with quarantine instructions and return precautions given.      Patient is discharged home in stable condition, with instructions to follow up with their regular doctor. They are advised to return immediately for any worsening or symptoms of concern.      The patient was personally evaluated by myself and with general supervision by  Konrad Felix, MD who agrees with the above assessment and plan.      Shireen Quan, PA   Dec 29, 2020      My signature above authenticates this document and my orders, the final     diagnosis (es), discharge prescription (s), and instructions in the Epic     record.   If you have any questions please contact [phone].       Nursing notes have been reviewed by the physician/ advanced practice     Clinician.      Dragon medical dictation software was used for portions of this report. Unintended voice recognition grammatical errors may occur.

## 2020-12-29 NOTE — ED Notes (Signed)
-   I don't know if its my allergies, or something else. I take of my mother, and I want to be check for covid    - patient only c/o of a cough, and sinus congestion, wants to be covid swab just in case, no other covid symptoms

## 2020-12-30 LAB — SARS-COV-2: SARS-CoV-2, NAA: NOT DETECTED

## 2020-12-30 LAB — SARS-COV-2, NAA: SARS-CoV-2, NAA: NOT DETECTED

## 2021-01-02 ENCOUNTER — Encounter

## 2021-01-03 MED ORDER — ATORVASTATIN 20 MG TAB
20 mg | ORAL_TABLET | Freq: Every evening | ORAL | 1 refills | Status: AC
Start: 2021-01-03 — End: ?

## 2021-01-05 ENCOUNTER — Encounter

## 2021-01-05 MED ORDER — CYCLOBENZAPRINE 5 MG TAB
5 mg | ORAL_TABLET | ORAL | 1 refills | Status: AC
Start: 2021-01-05 — End: ?

## 2021-02-05 ENCOUNTER — Ambulatory Visit: Payer: MEDICARE | Attending: Student in an Organized Health Care Education/Training Program | Primary: Family

## 2021-02-05 ENCOUNTER — Other Ambulatory Visit: Payer: MEDICARE | Primary: Family

## 2021-02-12 ENCOUNTER — Ambulatory Visit: Payer: MEDICARE | Primary: Family

## 2021-02-14 ENCOUNTER — Ambulatory Visit: Admit: 2021-02-14 | Discharge: 2021-02-16 | Payer: MEDICARE | Primary: Family

## 2021-02-14 ENCOUNTER — Inpatient Hospital Stay: Admit: 2021-02-14 | Payer: MEDICARE | Primary: Family

## 2021-02-14 DIAGNOSIS — C8191 Hodgkin lymphoma, unspecified, lymph nodes of head, face, and neck: Secondary | ICD-10-CM

## 2021-02-14 LAB — COMPREHENSIVE METABOLIC PANEL
ALT: 42 U/L (ref 13–56)
AST: 34 U/L (ref 10–38)
Albumin/Globulin Ratio: 0.8 (ref 0.8–1.7)
Albumin: 3.5 g/dL (ref 3.4–5.0)
Alkaline Phosphatase: 66 U/L (ref 45–117)
Anion Gap: 5 mmol/L (ref 3.0–18)
BUN: 10 MG/DL (ref 7.0–18)
Bun/Cre Ratio: 12 (ref 12–20)
CO2: 27 mmol/L (ref 21–32)
Calcium: 9.2 MG/DL (ref 8.5–10.1)
Chloride: 105 mmol/L (ref 100–111)
Creatinine: 0.81 MG/DL (ref 0.6–1.3)
EGFR IF NonAfrican American: >60 mL/min/{1.73_m2} (ref >60)
GFR African American: >60 mL/min/{1.73_m2} (ref >60)
Globulin: 4.3 g/dL — ABNORMAL HIGH (ref 2.0–4.0)
Glucose: 103 mg/dL — ABNORMAL HIGH (ref 74–99)
Potassium: 4.4 mmol/L (ref 3.5–5.5)
Sodium: 137 mmol/L (ref 136–145)
Total Bilirubin: 0.3 MG/DL (ref 0.2–1.0)
Total Protein: 7.8 g/dL (ref 6.4–8.2)

## 2021-02-14 LAB — CBC WITH AUTO DIFFERENTIAL
Basophils %: 0 % (ref 0–2)
Basophils Absolute: 0 10*3/uL (ref 0.0–0.1)
Eosinophils %: 1 % (ref 0–5)
Eosinophils Absolute: 0 10*3/uL (ref 0.0–0.4)
Granulocyte Absolute Count: 0 10*3/uL (ref 0.00–0.04)
Hematocrit: 39.5 % (ref 35.0–45.0)
Hemoglobin: 12.4 g/dL (ref 12.0–16.0)
Immature Granulocytes: 1 % — ABNORMAL HIGH (ref 0.0–0.5)
Lymphocytes %: 37 % (ref 21–52)
Lymphocytes Absolute: 1.7 10*3/uL (ref 0.9–3.6)
MCH: 26.1 PG (ref 24.0–34.0)
MCHC: 31.4 g/dL (ref 31.0–37.0)
MCV: 83.2 FL (ref 78.0–100.0)
MPV: 11 FL (ref 9.2–11.8)
Monocytes %: 11 % — ABNORMAL HIGH (ref 3–10)
Monocytes Absolute: 0.5 10*3/uL (ref 0.05–1.2)
NRBC Absolute: 0 10*3/uL (ref 0.00–0.01)
Neutrophils %: 51 % (ref 40–73)
Neutrophils Absolute: 2.3 10*3/uL (ref 1.8–8.0)
Nucleated RBCs: 0 PER 100 WBC
Platelets: 274 10*3/uL (ref 135–420)
RBC: 4.75 M/uL (ref 4.20–5.30)
RDW: 17.3 % — ABNORMAL HIGH (ref 11.6–14.5)
WBC: 4.5 10*3/uL — ABNORMAL LOW (ref 4.6–13.2)

## 2021-02-14 LAB — FERRITIN
Ferritin: 186 ng/mL (ref 8–388)
Ferritin: 186 NG/ML (ref 8–388)

## 2021-02-14 LAB — IRON AND TIBC
Iron Saturation: 12 % — ABNORMAL LOW (ref 20–50)
Iron: 58 ug/dL (ref 50–175)
TIBC: 470 ug/dL — ABNORMAL HIGH (ref 250–450)

## 2021-02-14 LAB — SEDIMENTATION RATE: Sed Rate: 16 mm/hr (ref 0–30)

## 2021-02-14 LAB — CBC WITH AUTOMATED DIFF
ABS. BASOPHILS: 0 10*3/uL (ref 0.0–0.1)
ABS. EOSINOPHILS: 0 10*3/uL (ref 0.0–0.4)
ABS. IMM. GRANS.: 0 10*3/uL (ref 0.00–0.04)
ABS. LYMPHOCYTES: 1.7 10*3/uL (ref 0.9–3.6)
ABS. MONOCYTES: 0.5 10*3/uL (ref 0.05–1.2)
ABS. NEUTROPHILS: 2.3 10*3/uL (ref 1.8–8.0)
ABSOLUTE NRBC: 0 10*3/uL (ref 0.00–0.01)
BASOPHILS: 0 % (ref 0–2)
EOSINOPHILS: 1 % (ref 0–5)
HCT: 39.5 % (ref 35.0–45.0)
HGB: 12.4 g/dL (ref 12.0–16.0)
IMMATURE GRANULOCYTES: 1 % — ABNORMAL HIGH (ref 0.0–0.5)
LYMPHOCYTES: 37 % (ref 21–52)
MCH: 26.1 pg (ref 24.0–34.0)
MCHC: 31.4 g/dL (ref 31.0–37.0)
MCV: 83.2 fL (ref 78.0–100.0)
MONOCYTES: 11 % — ABNORMAL HIGH (ref 3–10)
MPV: 11 fL (ref 9.2–11.8)
NEUTROPHILS: 51 % (ref 40–73)
NRBC: 0 /100{WBCs}
PLATELET: 274 10*3/uL (ref 135–420)
RBC: 4.75 M/uL (ref 4.20–5.30)
RDW: 17.3 % — ABNORMAL HIGH (ref 11.6–14.5)
WBC: 4.5 10*3/uL — ABNORMAL LOW (ref 4.6–13.2)

## 2021-02-14 LAB — METABOLIC PANEL, COMPREHENSIVE
A-G Ratio: 0.8 (ref 0.8–1.7)
ALT (SGPT): 42 U/L (ref 13–56)
AST (SGOT): 34 U/L (ref 10–38)
Albumin: 3.5 g/dL (ref 3.4–5.0)
Alk. phosphatase: 66 U/L (ref 45–117)
Anion gap: 5 mmol/L (ref 3.0–18)
BUN/Creatinine ratio: 12 (ref 12–20)
BUN: 10 mg/dL (ref 7.0–18)
Bilirubin, total: 0.3 mg/dL (ref 0.2–1.0)
CO2: 27 mmol/L (ref 21–32)
Calcium: 9.2 mg/dL (ref 8.5–10.1)
Chloride: 105 mmol/L (ref 100–111)
Creatinine: 0.81 mg/dL (ref 0.6–1.3)
GFR est AA: >60 mL/min/{1.73_m2} (ref >60)
GFR est non-AA: >60 mL/min/{1.73_m2} (ref >60)
Globulin: 4.3 g/dL — ABNORMAL HIGH (ref 2.0–4.0)
Glucose: 103 mg/dL — ABNORMAL HIGH (ref 74–99)
Potassium: 4.4 mmol/L (ref 3.5–5.5)
Protein, total: 7.8 g/dL (ref 6.4–8.2)
Sodium: 137 mmol/L (ref 136–145)

## 2021-02-14 LAB — IRON PROFILE
Iron % saturation: 12 % — ABNORMAL LOW (ref 20–50)
Iron: 58 ug/dL (ref 50–175)
TIBC: 470 ug/dL — ABNORMAL HIGH (ref 250–450)

## 2021-02-14 LAB — SED RATE (ESR): Sed rate, automated: 16 mm/h (ref 0–30)

## 2021-02-22 ENCOUNTER — Ambulatory Visit: Payer: MEDICARE | Attending: Internal Medicine | Primary: Family

## 2021-03-01 ENCOUNTER — Ambulatory Visit: Attending: Internal Medicine | Primary: Family

## 2021-03-01 ENCOUNTER — Ambulatory Visit: Admit: 2021-03-01 | Discharge: 2021-03-01 | Payer: MEDICARE | Attending: Internal Medicine | Primary: Family

## 2021-03-01 DIAGNOSIS — D508 Other iron deficiency anemias: Secondary | ICD-10-CM
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
## 2021-03-22 NOTE — Progress Notes (Signed)
Has [facility] Case Manager

## 2021-04-11 ENCOUNTER — Encounter

## 2021-04-11 ENCOUNTER — Encounter: Payer: MEDICARE | Primary: Family

## 2021-05-24 NOTE — Telephone Encounter (Signed)
POPULATION HEALTH CLINICAL PHARMACY: ADHERENCE REVIEW  Identified care gap per [employer]: fills at [place] : ACE/ARB and Statin adherence    Last Visit: 09/26/2020    ASSESSMENT  ACE/ARB ADHERENCE    Insurance Records claims through 05/14/2021 (2021 [place] = n/a%; YTD [place] =  75%; Potential Fail Date: 05/29/2021 ):   Lisinopril/HCTZ 20-25mg  tab last filled on 01/02/2021 for 90 day supply. Next refill due: 04/03/2021    Per  [employer] Portal:   Lisinopril/HCTZ 20-25mg  tab  last filled on 01/02/2021 for 90 day supply.     [facility]:   Lisinopril/HCTZ 20-25mg  Per pharmacy patient is not in system      BP Readings from Last 3 Encounters:   03/01/21 130/85   12/29/20 139/77   09/26/20 (!) 140/70     Estimated Creatinine Clearance: 91.2 mL/min (by C-G formula based on SCr of 0.81 mg/dL).    [facility] Records claims through 05/14/2021 (2021 [place] = n/a%; YTD [place] = Filled only once; Potential Fail Date: 05/29/2021 ):   Atorvastatin 20mg  last filled on 01/03/2021 for 90 day supply. Next refill due: 04/03/2021    Per  [employer] Portal:   Atorvastatin 20mg  last filled on 01/03/2021 for 90 day supply.     Per [facility]:   Atorvastatin 20mg  Per pharmacy patient is not in system    Patient no longer taking medication as of 03/01/2021    Lab Results   Component Value Date/Time    Cholesterol, total 194 12/26/2016 08:17 AM    HDL Cholesterol 40 12/26/2016 08:17 AM    LDL, calculated 137.6 (H) 12/26/2016 08:17 AM    VLDL, calculated 16.4 12/26/2016 08:17 AM    Triglyceride 82 12/26/2016 08:17 AM    CHOL/HDL Ratio 4.9 12/26/2016 08:17 AM     ALT (SGPT)   Date Value Ref Range Status   02/14/2021 42 13 - 56 U/L Final     AST (SGOT)   Date Value Ref Range Status   02/14/2021 34 10 - 38 U/L Final     The ASCVD Risk score (Arnett DK, et al., 2019) failed to calculate for the following reasons:    Cannot find a previous HDL lab    Cannot find a previous total cholesterol lab     PLAN  The following are  interventions that have been identified:  - Patient overdue refilling Lisinopril/HCTZ 20-25mg  tab  and active on home medication list    Reached patient to review. Patient stated that she has [place] out of [place] and is working to get meds transferred to new pharmacy.        Future Appointments   Date Time Provider [place]   06/19/2021  9:15 AM LAB_BSMO BSMO BS AMB   07/02/2021 11:30 AM Do, Ena Dawley, MD [place]  Direct: [phone]  Department, toll free:[phone], Option 2     For Pharmacy Admin Tracking Only    CPA in place: No  Gap Closed?: No  Time Spent (min): 15

## 2021-06-17 ENCOUNTER — Encounter (HOSPITAL_COMMUNITY): Payer: Self-pay

## 2021-06-17 ENCOUNTER — Emergency Department (HOSPITAL_COMMUNITY)
Admission: EM | Admit: 2021-06-17 | Discharge: 2021-06-17 | Disposition: A | Discharge status: Home / Self Care | Payer: Medicare Other | Attending: Student | Admitting: Student

## 2021-06-17 DIAGNOSIS — G43009 Migraine without aura, not intractable, without status migrainosus: Secondary | ICD-10-CM | POA: Diagnosis not present

## 2021-06-17 DIAGNOSIS — I10 Essential (primary) hypertension: Secondary | ICD-10-CM | POA: Diagnosis not present

## 2021-06-17 DIAGNOSIS — R519 Headache, unspecified: Secondary | ICD-10-CM

## 2021-06-17 DIAGNOSIS — Z859 Personal history of malignant neoplasm, unspecified: Secondary | ICD-10-CM | POA: Insufficient documentation

## 2021-06-17 DIAGNOSIS — Z87891 Personal history of nicotine dependence: Secondary | ICD-10-CM | POA: Diagnosis not present

## 2021-06-17 DIAGNOSIS — G43909 Migraine, unspecified, not intractable, without status migrainosus: Secondary | ICD-10-CM | POA: Diagnosis not present

## 2021-06-17 HISTORY — DX: Essential (primary) hypertension: I10

## 2021-06-17 HISTORY — DX: Malignant (primary) neoplasm, unspecified: C80.1

## 2021-06-17 LAB — CBC
HCT: 38.1 % (ref 36.0–46.0)
Hemoglobin: 12.8 g/dL (ref 12.0–15.0)
MCH: 27.5 pg (ref 26.0–34.0)
MCHC: 33.6 g/dL (ref 30.0–36.0)
MCV: 81.8 fL (ref 80.0–100.0)
Platelets: 256 10*3/uL (ref 150–400)
RBC: 4.66 MIL/uL (ref 3.87–5.11)
RDW: 16.1 % — ABNORMAL HIGH (ref 11.5–15.5)
WBC: 4.9 10*3/uL (ref 4.0–10.5)
nRBC: 0 % (ref 0.0–0.2)

## 2021-06-17 LAB — BASIC METABOLIC PANEL
Anion gap: 8 (ref 5–15)
BUN: 15 mg/dL (ref 6–20)
CO2: 23 mmol/L (ref 22–32)
Calcium: 9 mg/dL (ref 8.9–10.3)
Chloride: 105 mmol/L (ref 98–111)
Creatinine, Ser: 0.74 mg/dL (ref 0.44–1.00)
GFR, Estimated: >60 mL/min (ref >60)
Glucose, Bld: 94 mg/dL (ref 70–99)
Potassium: 3.9 mmol/L (ref 3.5–5.1)
Sodium: 136 mmol/L (ref 135–145)

## 2021-06-17 MED ORDER — DIPHENHYDRAMINE HCL 50 MG/ML IJ SOLN
12.5000 mg | Freq: Once | INTRAMUSCULAR | Status: AC
  Administered 2021-06-17: 12.5 mg via INTRAVENOUS
  Filled 2021-06-17: qty 1

## 2021-06-17 MED ORDER — METOCLOPRAMIDE HCL 5 MG/ML IJ SOLN
10.0000 mg | Freq: Once | INTRAMUSCULAR | Status: AC
  Administered 2021-06-17: 10 mg via INTRAVENOUS
  Filled 2021-06-17: qty 2

## 2021-06-17 MED ORDER — KETOROLAC TROMETHAMINE 15 MG/ML IJ SOLN
15.0000 mg | Freq: Once | INTRAMUSCULAR | Status: AC
  Administered 2021-06-17: 15 mg via INTRAVENOUS
  Filled 2021-06-17: qty 1

## 2021-06-17 MED ORDER — SODIUM CHLORIDE 0.9 % IV SOLN: INTRAVENOUS | Status: DC

## 2021-06-17 MED ORDER — FAMOTIDINE 20 MG PO TABS: 20.0000 mg | ORAL_TABLET | Freq: Two times a day (BID) | ORAL | 0 refills | Status: DC

## 2021-06-17 MED ORDER — LISINOPRIL-HYDROCHLOROTHIAZIDE 20-12.5 MG PO TABS: 1.0000 | ORAL_TABLET | Freq: Every day | ORAL | 0 refills | Status: DC

## 2021-06-17 MED ORDER — SODIUM CHLORIDE 0.9 % IV BOLUS
1000.0000 mL | Freq: Once | INTRAVENOUS | Status: AC
  Administered 2021-06-17: 1000 mL via INTRAVENOUS

## 2021-06-17 MED ORDER — POTASSIUM CHLORIDE CRYS ER 20 MEQ PO TBCR: 10.0000 meq | EXTENDED_RELEASE_TABLET | Freq: Two times a day (BID) | ORAL | 0 refills | Status: DC

## 2021-06-17 MED ORDER — CYCLOBENZAPRINE HCL 10 MG PO TABS: 10.0000 mg | ORAL_TABLET | Freq: Two times a day (BID) | ORAL | 0 refills | Status: DC | PRN

## 2021-06-17 NOTE — ED Provider Notes (Signed)
[facility] EMERGENCY DEPARTMENT Provider Note   CSN: [id number] Arrival date & time: 06/17/21  1348     History Chief Complaint  Patient presents with   Migraine    Beth Barber is a 55 y.o. female with a past medical history of hypertension presenting today with a complaint of migraine.  She reports that this migraine has been ongoing for the past 1 to 2 weeks.  She thinks it is because her blood pressure is too high because she is run out of her medications.  Has not been checking her blood pressure at home.  Denies visual changes.  No photophobia or phonophobia.  She reports that this feels like her previous migraines however she cannot get it to go away with sleep.  Denies nausea, vomiting or diarrhea.  No urinary symptoms.  Denies dizziness or fatigue.  No fevers or chills.    Past Medical History:  Diagnosis Date   Cancer ([place])    Hypertension     There are no problems to display for this patient.   Past Surgical History:  Procedure Laterality Date   ABDOMINAL HYSTERECTOMY       OB History   No obstetric history on file.     Family History  Problem Relation Age of Onset   Cancer Mother    Cancer Father    Cancer Sister    Cancer Brother     Social History   Tobacco Use   Smoking status: Former    Types: Cigarettes    Quit date: 05/24/2020    Years since quitting: 1.0   Smokeless tobacco: Never  Vaping Use   Vaping Use: Never used  Substance Use Topics   Alcohol use: Yes    Comment: Pt. states sometimes.   Drug use: Never    Home Medications Prior to Admission medications   Not on File    Allergies    Hydrocodone-acetaminophen  Review of Systems   Review of Systems  Constitutional:  Negative for chills and fever.  HENT:  Negative for ear pain and sore throat.   Eyes:  Negative for photophobia, pain and visual disturbance.  Respiratory:  Negative for cough and shortness of breath.   Cardiovascular:  Negative for chest pain and  palpitations.  Gastrointestinal:  Negative for abdominal pain, nausea and vomiting.  Genitourinary:  Negative for dysuria and hematuria.  Musculoskeletal:  Negative for arthralgias and back pain.  Skin:  Negative for color change and rash.  Neurological:  Positive for headaches. Negative for dizziness, seizures and syncope.  All other systems reviewed and are negative.  Physical Exam Updated Vital Signs BP (!) 164/87 (BP Location: Right Arm)   Pulse (!) 56   Temp 98.2 F (36.8 C) (Oral)   Resp 18   Ht 5\' 5"  (1.651 m)   Wt 96.6 kg   SpO2 100%   BMI 35.45 kg/m   Physical Exam Vitals and nursing note reviewed.  Constitutional:      General: She is not in acute distress.    Appearance: Normal appearance. She is not ill-appearing.  HENT:     Head: Normocephalic and atraumatic.     Right Ear: Tympanic membrane and ear canal normal.     Left Ear: Tympanic membrane and ear canal normal.     Nose: No congestion.     Mouth/Throat:     Mouth: Mucous membranes are moist.     Pharynx: Oropharynx is clear.  Eyes:     General: No scleral  icterus.    Extraocular Movements: Extraocular movements intact.     Conjunctiva/sclera: Conjunctivae normal.     Pupils: Pupils are equal, round, and reactive to light.  Cardiovascular:     Rate and Rhythm: Normal rate and regular rhythm.     Heart sounds: No murmur heard. Pulmonary:     Effort: Pulmonary effort is normal. No respiratory distress.  Abdominal:     Palpations: Abdomen is soft.     Tenderness: There is no abdominal tenderness.  Musculoskeletal:        General: No tenderness. Normal range of motion.     Cervical back: Normal range of motion. No rigidity.  Skin:    General: Skin is warm and dry.     Findings: No rash.  Neurological:     Mental Status: She is alert.     Cranial Nerves: No cranial nerve deficit (2 through 12 intact).     Motor: No weakness.  Psychiatric:        Mood and Affect: Mood normal.        Behavior:  Behavior normal.    ED Results / Procedures / Treatments   Labs (all labs ordered are listed, but only abnormal results are displayed) Labs Reviewed  CBC - Abnormal; Notable for the following components:      Result Value   RDW 16.1 (*)    All other components within normal limits    EKG None  Radiology No results found.  Procedures Procedures   Medications Ordered in ED Medications  sodium chloride 0.9 % bolus 1,000 mL (1,000 mLs Intravenous New Bag/Given 06/17/21 1501)    And  0.9 %  sodium chloride infusion (has no administration in time range)  ketorolac (TORADOL) 15 MG/ML injection 15 mg (15 mg Intravenous Given 06/17/21 1504)  metoCLOPramide (REGLAN) injection 10 mg (10 mg Intravenous Given 06/17/21 1505)  diphenhydrAMINE (BENADRYL) injection 12.5 mg (12.5 mg Intravenous Given 06/17/21 1506)    ED Course  I have reviewed the triage vital signs and the nursing notes.  Pertinent labs & imaging results that were available during my care of the patient were reviewed by me and considered in my medical decision making (see chart for details).    MDM Rules/Calculators/A&P Emergent considerations for headache include but are not limited to subarachnoid hemorrhage, meningitis, temporal arteritis, glaucoma, cerebral ischemia, carotid/vertebral dissection, intracranial tumor, Venous sinus thrombosis, carbon monoxide poisoning, acute or chronic subdural hemorrhage.  All of these were considered throughout the evaluation of this patient.  Patient was evaluated by me, she was in no acute distress.  Low suspicion for intracranial hemorrhage or mass.  Meningeal tests are negative.  Cranial nerves II through XII are intact.  She reported that this headache felt like her previous ones however she was having difficulty getting it to resolve.  She believed it to be the result of uncontrolled hypertension.  Her blood pressure is elevated however not very impressive today.  She is out of her  daily medications because she just moved here from [place].  I we will refill her Flexeril, Pepcid, lisinopril-hydrochlorothiazide and potassium.  We discussed the importance of following up with her primary care provider.  Patient reports feeling better after migraine cocktail.  Remains without neurologic deficits.  I believe she is stable for discharge home at this time.  Return precautions were discussed and attached to her discharge papers. Final Clinical Impression(s) / ED Diagnoses Final diagnoses:  Bad headache    Rx / DC Orders Results  and diagnoses were explained to the patient. Return precautions discussed in full. Patient had no additional questions and expressed complete understanding.     Darliss Ridgel 06/17/21 [place]    Teressa Lower, MD 06/18/21 2329

## 2021-06-17 NOTE — ED Triage Notes (Signed)
Pt. States they have been having migraines for about two weeks now.  Pt. states they have run out of all of their medications.

## 2021-06-17 NOTE — Discharge Instructions (Signed)
Your blood work looks good today.  Your blood pressure was elevated, however not dangerously high.  I have sent your medications to your pharmacy.  Please follow-up with your primary care provider soon as you can.  You may utilize over-the-counter medications such as Tylenol, ibuprofen, Excedrin or others if your headaches persist.  Also please remember to stay hydrated and eat meals at normal intervals to keep your headaches at [place].  Please return to the emergency department if you have sudden onset headache, visual changes or any overall worsening of your condition.  It was a pleasure to meet you and I hope that you feel better.

## 2021-06-19 ENCOUNTER — Other Ambulatory Visit: Payer: MEDICARE | Primary: Family

## 2021-07-02 ENCOUNTER — Encounter: Attending: Internal Medicine | Primary: Family

## 2021-07-11 ENCOUNTER — Other Ambulatory Visit: Payer: Self-pay | Admitting: Family Medicine

## 2021-07-11 ENCOUNTER — Other Ambulatory Visit (HOSPITAL_COMMUNITY): Payer: Self-pay | Admitting: Family Medicine

## 2021-07-11 DIAGNOSIS — I10 Essential (primary) hypertension: Secondary | ICD-10-CM | POA: Diagnosis not present

## 2021-07-11 DIAGNOSIS — D509 Iron deficiency anemia, unspecified: Secondary | ICD-10-CM | POA: Diagnosis not present

## 2021-07-11 DIAGNOSIS — Z87891 Personal history of nicotine dependence: Secondary | ICD-10-CM

## 2021-07-11 DIAGNOSIS — E876 Hypokalemia: Secondary | ICD-10-CM | POA: Insufficient documentation

## 2021-07-11 DIAGNOSIS — E785 Hyperlipidemia, unspecified: Secondary | ICD-10-CM | POA: Insufficient documentation

## 2021-07-11 DIAGNOSIS — Z13820 Encounter for screening for osteoporosis: Secondary | ICD-10-CM | POA: Diagnosis not present

## 2021-07-11 DIAGNOSIS — K219 Gastro-esophageal reflux disease without esophagitis: Secondary | ICD-10-CM | POA: Diagnosis not present

## 2021-07-11 DIAGNOSIS — Z23 Encounter for immunization: Secondary | ICD-10-CM | POA: Diagnosis not present

## 2021-07-11 DIAGNOSIS — M545 Low back pain, unspecified: Secondary | ICD-10-CM | POA: Diagnosis not present

## 2021-07-11 DIAGNOSIS — Z8571 Personal history of Hodgkin lymphoma: Secondary | ICD-10-CM | POA: Diagnosis not present

## 2021-07-11 DIAGNOSIS — R7303 Prediabetes: Secondary | ICD-10-CM | POA: Diagnosis not present

## 2021-07-11 DIAGNOSIS — Z1211 Encounter for screening for malignant neoplasm of colon: Secondary | ICD-10-CM | POA: Diagnosis not present

## 2021-07-13 ENCOUNTER — Other Ambulatory Visit (HOSPITAL_COMMUNITY): Payer: Self-pay | Admitting: Family Medicine

## 2021-07-13 DIAGNOSIS — Z13820 Encounter for screening for osteoporosis: Secondary | ICD-10-CM

## 2021-07-17 ENCOUNTER — Other Ambulatory Visit (HOSPITAL_COMMUNITY): Payer: Medicare Other

## 2021-07-18 ENCOUNTER — Ambulatory Visit (HOSPITAL_COMMUNITY)
Admission: RE | Admit: 2021-07-18 | Discharge: 2021-07-18 | Disposition: A | Discharge status: Home / Self Care | Payer: Medicare Other | Source: Ambulatory Visit | Attending: Family Medicine | Admitting: Family Medicine

## 2021-07-18 ENCOUNTER — Encounter: Payer: Self-pay | Admitting: Internal Medicine

## 2021-07-18 ENCOUNTER — Other Ambulatory Visit: Payer: Self-pay

## 2021-07-18 DIAGNOSIS — Z780 Asymptomatic menopausal state: Secondary | ICD-10-CM | POA: Insufficient documentation

## 2021-07-18 DIAGNOSIS — Z13820 Encounter for screening for osteoporosis: Secondary | ICD-10-CM | POA: Insufficient documentation

## 2021-07-18 DIAGNOSIS — M8588 Other specified disorders of bone density and structure, other site: Secondary | ICD-10-CM | POA: Insufficient documentation

## 2021-07-20 DIAGNOSIS — D509 Iron deficiency anemia, unspecified: Secondary | ICD-10-CM | POA: Diagnosis not present

## 2021-07-20 DIAGNOSIS — E611 Iron deficiency: Secondary | ICD-10-CM | POA: Diagnosis not present

## 2021-07-20 DIAGNOSIS — I10 Essential (primary) hypertension: Secondary | ICD-10-CM | POA: Diagnosis not present

## 2021-07-20 DIAGNOSIS — E569 Vitamin deficiency, unspecified: Secondary | ICD-10-CM | POA: Diagnosis not present

## 2021-07-20 DIAGNOSIS — R7303 Prediabetes: Secondary | ICD-10-CM | POA: Diagnosis not present

## 2021-07-23 DIAGNOSIS — E559 Vitamin D deficiency, unspecified: Secondary | ICD-10-CM | POA: Insufficient documentation

## 2021-07-23 DIAGNOSIS — R7989 Other specified abnormal findings of blood chemistry: Secondary | ICD-10-CM | POA: Insufficient documentation

## 2021-07-23 DIAGNOSIS — R945 Abnormal results of liver function studies: Secondary | ICD-10-CM | POA: Insufficient documentation

## 2021-07-25 ENCOUNTER — Other Ambulatory Visit (HOSPITAL_COMMUNITY): Payer: Self-pay | Admitting: Family Medicine

## 2021-07-25 ENCOUNTER — Other Ambulatory Visit: Payer: Self-pay | Admitting: Family Medicine

## 2021-07-25 DIAGNOSIS — E785 Hyperlipidemia, unspecified: Secondary | ICD-10-CM | POA: Diagnosis not present

## 2021-07-25 DIAGNOSIS — E559 Vitamin D deficiency, unspecified: Secondary | ICD-10-CM | POA: Diagnosis not present

## 2021-07-25 DIAGNOSIS — Z0001 Encounter for general adult medical examination with abnormal findings: Secondary | ICD-10-CM | POA: Diagnosis not present

## 2021-07-25 DIAGNOSIS — D509 Iron deficiency anemia, unspecified: Secondary | ICD-10-CM | POA: Diagnosis not present

## 2021-07-25 DIAGNOSIS — R7303 Prediabetes: Secondary | ICD-10-CM | POA: Diagnosis not present

## 2021-07-25 DIAGNOSIS — R945 Abnormal results of liver function studies: Secondary | ICD-10-CM | POA: Diagnosis not present

## 2021-07-30 ENCOUNTER — Ambulatory Visit (HOSPITAL_COMMUNITY): Admission: RE | Admit: 2021-07-30 | Payer: Medicare Other | Source: Ambulatory Visit

## 2021-07-30 ENCOUNTER — Encounter (HOSPITAL_COMMUNITY): Payer: Self-pay

## 2021-07-31 ENCOUNTER — Ambulatory Visit (HOSPITAL_COMMUNITY)
Admission: RE | Admit: 2021-07-31 | Discharge: 2021-07-31 | Disposition: A | Discharge status: Home / Self Care | Payer: Medicare Other | Source: Ambulatory Visit | Attending: Family Medicine | Admitting: Family Medicine

## 2021-07-31 ENCOUNTER — Other Ambulatory Visit: Payer: Self-pay

## 2021-07-31 DIAGNOSIS — R945 Abnormal results of liver function studies: Secondary | ICD-10-CM | POA: Insufficient documentation

## 2021-07-31 DIAGNOSIS — K7689 Other specified diseases of liver: Secondary | ICD-10-CM | POA: Diagnosis not present

## 2021-08-15 ENCOUNTER — Ambulatory Visit: Payer: Medicare Other

## 2021-10-03 ENCOUNTER — Other Ambulatory Visit: Payer: Self-pay

## 2021-10-03 ENCOUNTER — Ambulatory Visit (INDEPENDENT_AMBULATORY_CARE_PROVIDER_SITE_OTHER): Payer: Self-pay | Admitting: *Deleted

## 2021-10-03 ENCOUNTER — Encounter: Payer: Self-pay | Admitting: *Deleted

## 2021-10-03 VITALS — Ht 66.0 in | Wt 212.0 lb

## 2021-10-03 DIAGNOSIS — Z1211 Encounter for screening for malignant neoplasm of colon: Secondary | ICD-10-CM

## 2021-10-03 MED ORDER — PEG 3350-KCL-NA BICARB-NACL 420 G PO SOLR: 4000.0000 mL | Freq: Once | ORAL | 0 refills | Status: AC

## 2021-10-03 NOTE — Progress Notes (Addendum)
Gastroenterology Pre-Procedure Review  Request Date: 10/03/2021 Requesting Physician: Eloy End, FNP, no previous TCS, family hx of colon cancer (mother and father)  PATIENT REVIEW QUESTIONS: The patient responded to the following health history questions as indicated:    1. Diabetes Melitis: no, borderline managed by diet and exercise 2. Joint replacements in the past 12 months: no 3. Major health problems in the past 3 months: no 4. Has an artificial valve or MVP: no 5. Has a defibrillator: no 6. Has been advised in past to take antibiotics in advance of a procedure like teeth cleaning: no 7. Family history of colon cancer: yes, mother: age 42, father: age 68 8. Alcohol Use: yes, 2-3 bottles of wine a week 9. Illicit drug Use: no 10. History of sleep apnea: no 11. History of coronary artery or other vascular stents placed within the last 12 months: no 12. History of any prior anesthesia complications: yes, hard to wake hysterectomy 2004 13. Body mass index is 34.22 kg/m.    MEDICATIONS & ALLERGIES:    Patient reports the following regarding taking any blood thinners:   Plavix? no Aspirin? no Coumadin? no Brilinta? no Xarelto? no Eliquis? no Pradaxa? no Savaysa? no Effient? no  Patient confirms/reports the following medications:  Current Outpatient Medications  Medication Sig Dispense Refill   Cholecalciferol (VITAMIN D) 50 MCG (2000 UT) CAPS Take by mouth daily at 6 (six) AM. Takes 2 capsules daily.     famotidine (PEPCID) 20 MG tablet Take 1 tablet (20 mg total) by mouth 2 (two) times daily. (Patient taking differently: Take 20 mg by mouth daily at 6 (six) AM.) 60 tablet 0   lisinopril-hydrochlorothiazide (ZESTORETIC) 20-12.5 MG tablet Take 1 tablet by mouth daily. 30 tablet 0   UNABLE TO FIND daily at 6 (six) AM. Med Name: [place] Fish Oil 800 mg, Flax Seed 800 mg, Omega 369     No current facility-administered medications for this visit.    Patient  confirms/reports the following allergies:  Allergies  Allergen Reactions   Hydrocodone-Acetaminophen Hives    No orders of the defined types were placed in this encounter.   AUTHORIZATION INFORMATION Primary Insurance: [facility] Medicare,  ID #: [id number],  Group #: [id number] Pre-Cert / Auth required: Yes, approved online 09/02/7260-0/35/5974 Pre-Cert / Josem Kaufmann #: [id number]  Secondary Insurance: Medicaid,  ID #: [id number] Pre-Cert / Josem Kaufmann required: No, not required  SCHEDULE INFORMATION: Procedure has been scheduled as follows:  Date: 10/15/2021, Time: 2:30 Location: [facility] with Dr. Abbey Chatters  This Gastroenterology Pre-Precedure Review Form is being routed to the following provider(s): Neil Crouch, PA-C

## 2021-10-03 NOTE — Progress Notes (Signed)
ASA 3 due to etoh use.  Ok to schedule. Propofol.

## 2021-10-03 NOTE — Addendum Note (Signed)
Addended by: Metro Kung on: 10/03/2021 11:47 AM   Modules accepted: Orders

## 2021-10-04 ENCOUNTER — Encounter: Payer: Self-pay | Admitting: *Deleted

## 2021-10-04 NOTE — Progress Notes (Signed)
Spoke to pt.  Scheduled procedure for 10/15/2021.  Pt made aware Pre-op is scheduled for 10/12/2021 at 1:15 at [facility].  Reviewed prep instructions with pt by phone.  Pt made aware that I will be mailing out instructions.  Confirmed mailing address with pt.

## 2021-10-08 ENCOUNTER — Other Ambulatory Visit: Payer: Self-pay | Admitting: *Deleted

## 2021-10-11 NOTE — Patient Instructions (Addendum)
Beth Barber  10/11/2021     @PREFPERIOPPHARMACY @   Your procedure is scheduled on  10/15/2021.   Report to [facility] at  1215 P.M.   Call this number if you have problems the morning of surgery:  [phone]   Remember:  Follow the diet and prep instructions given to you by the office.    Take these medicines the morning of surgery with A SIP OF WATER                                          pepcid.     Do not wear jewelry, make-up or nail polish.  Do not wear lotions, powders, or perfumes, or deodorant.  Do not shave 48 hours prior to surgery.  Men may shave face and neck.  Do not bring valuables to the hospital.  [facility] is not responsible for any belongings or valuables.  Contacts, dentures or bridgework may not be worn into surgery.  Leave your suitcase in the car.  After surgery it may be brought to your room.  For patients admitted to the hospital, discharge time will be determined by your treatment team.  Patients discharged the day of surgery will not be allowed to drive home and must have someone with them for 24 hours.    Special instructions:   DO NOT smoke tobacco or vape for 24 hours before your procedure.  Please read over the following fact sheets that you were given. Anesthesia Post-op Instructions and Care and Recovery After Surgery      Colonoscopy, Adult, Care After This sheet gives you information about how to care for yourself after your procedure. Your health care provider may also give you more specific instructions. If you have problems or questions, contact your health care provider. What can I expect after the procedure? After the procedure, it is common to have: A small amount of blood in your stool for 24 hours after the procedure. Some gas. Mild cramping or bloating of your abdomen. Follow these instructions at home: Eating and drinking  Drink enough fluid to keep your urine pale yellow. Follow instructions from your  health care provider about eating or drinking restrictions. Resume your normal diet as instructed by your health care provider. Avoid heavy or fried foods that are hard to digest. Activity Rest as told by your health care provider. Avoid sitting for a long time without moving. Get up to take short walks every 1-2 hours. This is important to improve blood flow and breathing. Ask for help if you feel weak or unsteady. Return to your normal activities as told by your health care provider. Ask your health care provider what activities are safe for you. Managing cramping and bloating  Try walking around when you have cramps or feel bloated. Apply heat to your abdomen as told by your health care provider. Use the heat source that your health care provider recommends, such as a moist heat pack or a heating pad. Place a towel between your skin and the heat source. Leave the heat on for 20-30 minutes. Remove the heat if your skin turns bright red. This is especially important if you are unable to feel pain, heat, or cold. You may have a greater risk of getting burned. General instructions If you were given a sedative during the procedure, it can affect you  for several hours. Do not drive or operate machinery until your health care provider says that it is safe. For the first 24 hours after the procedure: Do not sign important documents. Do not drink alcohol. Do your regular daily activities at a slower pace than normal. Eat soft foods that are easy to digest. Take over-the-counter and prescription medicines only as told by your health care provider. Keep all follow-up visits as told by your health care provider. This is important. Contact a health care provider if: You have blood in your stool 2-3 days after the procedure. Get help right away if you have: More than a small spotting of blood in your stool. Large blood clots in your stool. Swelling of your abdomen. Nausea or vomiting. A  fever. Increasing pain in your abdomen that is not relieved with medicine. Summary After the procedure, it is common to have a small amount of blood in your stool. You may also have mild cramping and bloating of your abdomen. If you were given a sedative during the procedure, it can affect you for several hours. Do not drive or operate machinery until your health care provider says that it is safe. Get help right away if you have a lot of blood in your stool, nausea or vomiting, a fever, or increased pain in your abdomen. This information is not intended to replace advice given to you by your health care provider. Make sure you discuss any questions you have with your health care provider. Document Revised: 06/11/2019 Document Reviewed: 03/01/2019 Elsevier Patient Education  [place] After This sheet gives you information about how to care for yourself after your procedure. Your health care provider may also give you more specific instructions. If you have problems or questions, contact your health care provider. What can I expect after the procedure? After the procedure, it is common to have: Tiredness. Forgetfulness about what happened after the procedure. Impaired judgment for important decisions. Nausea or vomiting. Some difficulty with balance. Follow these instructions at home: For the time period you were told by your health care provider:   Rest as needed. Do not participate in activities where you could fall or become injured. Do not drive or use machinery. Do not drink alcohol. Do not take sleeping pills or medicines that cause drowsiness. Do not make important decisions or sign legal documents. Do not take care of children on your own. Eating and drinking Follow the diet that is recommended by your health care provider. Drink enough fluid to keep your urine pale yellow. If you vomit: Drink water, juice, or soup when you can drink  without vomiting. Make sure you have little or no nausea before eating solid foods. General instructions Have a responsible adult stay with you for the time you are told. It is important to have someone help care for you until you are awake and alert. Take over-the-counter and prescription medicines only as told by your health care provider. If you have sleep apnea, surgery and certain medicines can increase your risk for breathing problems. Follow instructions from your health care provider about wearing your sleep device: Anytime you are sleeping, including during daytime naps. While taking prescription pain medicines, sleeping medicines, or medicines that make you drowsy. Avoid smoking. Keep all follow-up visits as told by your health care provider. This is important. Contact a health care provider if: You keep feeling nauseous or you keep vomiting. You feel light-headed. You are still sleepy or  having trouble with balance after 24 hours. You develop a rash. You have a fever. You have redness or swelling around the IV site. Get help right away if: You have trouble breathing. You have new-onset confusion at home. Summary For several hours after your procedure, you may feel tired. You may also be forgetful and have poor judgment. Have a responsible adult stay with you for the time you are told. It is important to have someone help care for you until you are awake and alert. Rest as told. Do not drive or operate machinery. Do not drink alcohol or take sleeping pills. Get help right away if you have trouble breathing, or if you suddenly become confused. This information is not intended to replace advice given to you by your health care provider. Make sure you discuss any questions you have with your health care provider. Document Revised: 04/20/2020 Document Reviewed: 07/08/2019 Elsevier Patient Education  2022 [employer].

## 2021-10-12 ENCOUNTER — Encounter (HOSPITAL_COMMUNITY): Payer: Self-pay

## 2021-10-12 ENCOUNTER — Encounter (HOSPITAL_COMMUNITY)
Admission: RE | Admit: 2021-10-12 | Discharge: 2021-10-12 | Disposition: A | Discharge status: Home / Self Care | Payer: Medicare Other | Source: Ambulatory Visit | Attending: Internal Medicine | Admitting: Internal Medicine

## 2021-10-12 VITALS — BP 132/85 | HR 77 | Temp 97.6°F | Resp 18 | Ht 66.0 in | Wt 212.0 lb

## 2021-10-12 DIAGNOSIS — Z79899 Other long term (current) drug therapy: Secondary | ICD-10-CM | POA: Diagnosis not present

## 2021-10-12 DIAGNOSIS — Z01818 Encounter for other preprocedural examination: Secondary | ICD-10-CM | POA: Insufficient documentation

## 2021-10-12 HISTORY — DX: Unspecified asthma, uncomplicated: J45.909

## 2021-10-12 HISTORY — DX: Other complications of anesthesia, initial encounter: T88.59XA

## 2021-10-12 HISTORY — DX: Gastro-esophageal reflux disease without esophagitis: K21.9

## 2021-10-12 LAB — BASIC METABOLIC PANEL
Anion gap: 6 (ref 5–15)
BUN: 13 mg/dL (ref 6–20)
CO2: 25 mmol/L (ref 22–32)
Calcium: 8.9 mg/dL (ref 8.9–10.3)
Chloride: 107 mmol/L (ref 98–111)
Creatinine, Ser: 0.77 mg/dL (ref 0.44–1.00)
GFR, Estimated: >60 mL/min (ref >60)
Glucose, Bld: 118 mg/dL — ABNORMAL HIGH (ref 70–99)
Potassium: 3.9 mmol/L (ref 3.5–5.1)
Sodium: 138 mmol/L (ref 135–145)

## 2021-10-15 ENCOUNTER — Ambulatory Visit (HOSPITAL_COMMUNITY): Payer: Medicare Other | Admitting: Anesthesiology

## 2021-10-15 ENCOUNTER — Encounter (HOSPITAL_COMMUNITY): Payer: Self-pay

## 2021-10-15 ENCOUNTER — Ambulatory Visit (HOSPITAL_COMMUNITY)
Admission: RE | Admit: 2021-10-15 | Discharge: 2021-10-15 | Disposition: A | Discharge status: Home / Self Care | Payer: Medicare Other | Attending: Internal Medicine | Admitting: Internal Medicine

## 2021-10-15 ENCOUNTER — Encounter (HOSPITAL_COMMUNITY)
Admission: RE | Disposition: A | Discharge status: Home / Self Care | Payer: Self-pay | Source: Home / Self Care | Attending: Internal Medicine

## 2021-10-15 ENCOUNTER — Ambulatory Visit (HOSPITAL_BASED_OUTPATIENT_CLINIC_OR_DEPARTMENT_OTHER): Payer: Medicare Other | Admitting: Anesthesiology

## 2021-10-15 DIAGNOSIS — Z1211 Encounter for screening for malignant neoplasm of colon: Secondary | ICD-10-CM | POA: Diagnosis not present

## 2021-10-15 DIAGNOSIS — Z800 Family history of malignant neoplasm of digestive organs: Secondary | ICD-10-CM | POA: Insufficient documentation

## 2021-10-15 DIAGNOSIS — D125 Benign neoplasm of sigmoid colon: Secondary | ICD-10-CM

## 2021-10-15 DIAGNOSIS — K635 Polyp of colon: Secondary | ICD-10-CM

## 2021-10-15 DIAGNOSIS — Z87891 Personal history of nicotine dependence: Secondary | ICD-10-CM | POA: Insufficient documentation

## 2021-10-15 DIAGNOSIS — D120 Benign neoplasm of cecum: Secondary | ICD-10-CM | POA: Insufficient documentation

## 2021-10-15 DIAGNOSIS — K219 Gastro-esophageal reflux disease without esophagitis: Secondary | ICD-10-CM | POA: Diagnosis not present

## 2021-10-15 DIAGNOSIS — Z79899 Other long term (current) drug therapy: Secondary | ICD-10-CM | POA: Insufficient documentation

## 2021-10-15 DIAGNOSIS — J45909 Unspecified asthma, uncomplicated: Secondary | ICD-10-CM | POA: Insufficient documentation

## 2021-10-15 DIAGNOSIS — K648 Other hemorrhoids: Secondary | ICD-10-CM | POA: Insufficient documentation

## 2021-10-15 DIAGNOSIS — I10 Essential (primary) hypertension: Secondary | ICD-10-CM | POA: Diagnosis not present

## 2021-10-15 HISTORY — PX: POLYPECTOMY: SHX5525

## 2021-10-15 HISTORY — PX: BIOPSY: SHX5522

## 2021-10-15 HISTORY — PX: COLONOSCOPY WITH PROPOFOL: SHX5780

## 2021-10-15 SURGERY — COLONOSCOPY WITH PROPOFOL
Anesthesia: General

## 2021-10-15 MED ORDER — PROPOFOL 500 MG/50ML IV EMUL
INTRAVENOUS | Status: DC | PRN
  Administered 2021-10-15: 200 ug/kg/min via INTRAVENOUS

## 2021-10-15 MED ORDER — LIDOCAINE 2% (20 MG/ML) 5 ML SYRINGE
INTRAMUSCULAR | Status: DC | PRN
  Administered 2021-10-15: 50 mg via INTRAVENOUS

## 2021-10-15 MED ORDER — LACTATED RINGERS IV SOLN: INTRAVENOUS | Status: DC

## 2021-10-15 MED ORDER — PROPOFOL 10 MG/ML IV BOLUS
INTRAVENOUS | Status: DC | PRN
  Administered 2021-10-15: 20 mg via INTRAVENOUS
  Administered 2021-10-15: 30 mg via INTRAVENOUS
  Administered 2021-10-15: 70 mg via INTRAVENOUS

## 2021-10-15 NOTE — Anesthesia Preprocedure Evaluation (Addendum)
Anesthesia Evaluation  Patient identified by MRN, date of birth, ID band Patient awake    Reviewed: Allergy & Precautions, NPO status , Patient's Chart, lab work & pertinent test results  History of Anesthesia Complications (+) PROLONGED EMERGENCE and history of anesthetic complications  Airway Mallampati: II  TM Distance: >3 FB Neck ROM: Full    Dental  (+) Edentulous Upper, Edentulous Lower   Pulmonary asthma , former smoker,    Pulmonary exam normal breath sounds clear to auscultation       Cardiovascular hypertension, Pt. on medications Normal cardiovascular exam+ dysrhythmias (occasional PVCs)  Rhythm:Regular Rate:Normal     Neuro/Psych negative neurological ROS  negative psych ROS   GI/Hepatic GERD  Medicated and Controlled,(+)     substance abuse (one bottle of wine/day)  alcohol use,   Endo/Other  negative endocrine ROS  Renal/GU negative Renal ROS  negative genitourinary   Musculoskeletal negative musculoskeletal ROS (+)   Abdominal   Peds negative pediatric ROS (+)  Hematology negative hematology ROS (+)   Anesthesia Other Findings   Reproductive/Obstetrics negative OB ROS                           Anesthesia Physical Anesthesia Plan  ASA: 2  Anesthesia Plan: General   Post-op Pain Management: Minimal or no pain anticipated   Induction: Intravenous  PONV Risk Score and Plan: TIVA  Airway Management Planned: Natural Airway and Nasal Cannula  Additional Equipment:   Intra-op Plan:   Post-operative Plan:   Informed Consent: I have reviewed the patients History and Physical, chart, labs and discussed the procedure including the risks, benefits and alternatives for the proposed anesthesia with the patient or authorized representative who has indicated his/her understanding and acceptance.       Plan Discussed with: CRNA and Surgeon  Anesthesia Plan Comments:         Anesthesia Quick Evaluation

## 2021-10-15 NOTE — Op Note (Signed)
[facility] Patient Name: Beth Barber Procedure Date: 10/15/2021 1:32 PM MRN: [id number] Date of Birth: 09/07/1965 Attending MD: Elon Alas. Abbey Chatters DO CSN: [id number] Age: 56 Admit Type: Outpatient Procedure:                Colonoscopy Indications:              Colon cancer screening in patient at increased                            risk: Colorectal cancer in mother, Colon cancer                            screening in patient at increased risk: Colorectal                            cancer in father Providers:                Elon Alas. Abbey Chatters, DO, Janeece Riggers, RN, Kristine L.                            Risa Grill, Technician Referring MD:              Medicines:                See the Anesthesia note for documentation of the                            administered medications Complications:            No immediate complications. Estimated Blood Loss:     Estimated blood loss was minimal. Procedure:                Pre-Anesthesia Assessment:                           - The anesthesia plan was to use monitored                            anesthesia care (MAC).                           After obtaining informed consent, the colonoscope                            was passed under direct vision. Throughout the                            procedure, the patient's blood pressure, pulse, and                            oxygen saturations were monitored continuously. The                            [id number]-[id number] ([id number]) was introduced through the                            anus and advanced to the the cecum,  identified by                            appendiceal orifice and ileocecal valve. The                            colonoscopy was performed without difficulty. The                            patient tolerated the procedure well. The quality                            of the bowel preparation was evaluated using the                            BBPS [facility] Bowel Preparation Scale) with  scores                            of: Right Colon = 3, Transverse Colon = 3 and Left                            Colon = 3 (entire mucosa seen well with no residual                            staining, small fragments of stool or opaque                            liquid). The total BBPS score equals 9. Scope In: 1:45:57 PM Scope Out: 1:58:06 PM Scope Withdrawal Time: 0 hours 8 minutes 10 seconds  Total Procedure Duration: 0 hours 12 minutes 9 seconds  Findings:      The perianal and digital rectal examinations were normal.      Non-bleeding internal hemorrhoids were found during endoscopy.      A 2 mm polyp was found in the cecum. The polyp was sessile. The polyp       was removed with a cold biopsy forceps. Resection and retrieval were       complete.      A 5 mm polyp was found in the sigmoid colon. The polyp was       semi-pedunculated. The polyp was removed with a cold snare. Resection       and retrieval were complete.      The exam was otherwise without abnormality. Impression:               - Non-bleeding internal hemorrhoids.                           - One 2 mm polyp in the cecum, removed with a cold                            biopsy forceps. Resected and retrieved.                           - One 5 mm polyp in the sigmoid colon, removed with  a cold snare. Resected and retrieved.                           - The examination was otherwise normal. Moderate Sedation:      Per Anesthesia Care Recommendation:           - Patient has a contact number available for                            emergencies. The signs and symptoms of potential                            delayed complications were discussed with the                            patient. Return to normal activities tomorrow.                            Written discharge instructions were provided to the                            patient.                           - Resume previous diet.                            - Continue present medications.                           - Await pathology results.                           - Repeat colonoscopy in 5 years for surveillance.                           - Return to GI clinic PRN. Procedure Code(s):        --- Professional ---                           [phone], Colonoscopy, flexible; with removal of                            tumor(s), polyp(s), or other lesion(s) by snare                            technique                           45380, 31, Colonoscopy, flexible; with biopsy,                            single or multiple Diagnosis Code(s):        --- Professional ---                           K63.5, Polyp of colon  K64.8, Other hemorrhoids                           Z80.0, Family history of malignant neoplasm of                            digestive organs CPT copyright 2019 American Medical Association. All rights reserved. The codes documented in this report are preliminary and upon coder review may  be revised to meet current compliance requirements. Elon Alas. Abbey Chatters, DO [place] Abbey Chatters, DO 10/15/2021 2:01:16 PM This report has been signed electronically. Number of Addenda: 0

## 2021-10-15 NOTE — Transfer of Care (Signed)
Immediate Anesthesia Transfer of Care Note  Patient: Beth Barber  Procedure(s) Performed: COLONOSCOPY WITH PROPOFOL BIOPSY POLYPECTOMY  Patient Location: Short Stay  Anesthesia Type:MAC  Level of Consciousness: awake, alert , oriented and patient cooperative  Airway & Oxygen Therapy: Patient Spontanous Breathing and Patient connected to nasal cannula oxygen  Post-op Assessment: Report given to RN, Post -op Vital signs reviewed and stable and Patient moving all extremities  Post vital signs: Reviewed and stable  Last Vitals:  Vitals Value Taken Time  BP 119/76 10/15/21 1402  Temp 36.8 C 10/15/21 1402  Pulse    Resp 17 10/15/21 1402  SpO2 99 % 10/15/21 1402    Last Pain:  Vitals:   10/15/21 1402  TempSrc: Oral  PainSc: 0-No pain      Patients Stated Pain Goal: 5 ([id number])  Complications: No notable events documented.

## 2021-10-15 NOTE — Discharge Instructions (Addendum)
°  Colonoscopy Discharge Instructions  Read the instructions outlined below and refer to this sheet in the next few weeks. These discharge instructions provide you with general information on caring for yourself after you leave the hospital. Your doctor may also give you specific instructions. While your treatment has been planned according to the most current medical practices available, unavoidable complications occasionally occur.   ACTIVITY You may resume your regular activity, but move at a slower pace for the next 24 hours.  Take frequent rest periods for the next 24 hours.  Walking will help get rid of the air and reduce the bloated feeling in your belly (abdomen).  No driving for 24 hours (because of the medicine (anesthesia) used during the test).   Do not sign any important legal documents or operate any machinery for 24 hours (because of the anesthesia used during the test).  NUTRITION Drink plenty of fluids.  You may resume your normal diet as instructed by your doctor.  Begin with a light meal and progress to your normal diet. Heavy or fried foods are harder to digest and may make you feel sick to your stomach (nauseated).  Avoid alcoholic beverages for 24 hours or as instructed.  MEDICATIONS You may resume your normal medications unless your doctor tells you otherwise.  WHAT YOU CAN EXPECT TODAY Some feelings of bloating in the abdomen.  Passage of more gas than usual.  Spotting of blood in your stool or on the toilet paper.  IF YOU HAD POLYPS REMOVED DURING THE COLONOSCOPY: No aspirin products for 7 days or as instructed.  No alcohol for 7 days or as instructed.  Eat a soft diet for the next 24 hours.  FINDING OUT THE RESULTS OF YOUR TEST Not all test results are available during your visit. If your test results are not back during the visit, make an appointment with your caregiver to find out the results. Do not assume everything is normal if you have not heard from your  caregiver or the medical facility. It is important for you to follow up on all of your test results.  SEEK IMMEDIATE MEDICAL ATTENTION IF: You have more than a spotting of blood in your stool.  Your belly is swollen (abdominal distention).  You are nauseated or vomiting.  You have a temperature over 101.  You have abdominal pain or discomfort that is severe or gets worse throughout the day.   Your colonoscopy revealed 2 polyp(s) which I removed successfully. Await pathology results, my office will contact you. I recommend repeating colonoscopy in 5 years for surveillance purposes. Otherwise follow up with Gi as needed.   I hope you have a great rest of your week!  Charles K. Carver, D.O. Gastroenterology and Hepatology [facility]  

## 2021-10-15 NOTE — Anesthesia Postprocedure Evaluation (Signed)
Anesthesia Post Note  Patient: Beth Barber  Procedure(s) Performed: COLONOSCOPY WITH PROPOFOL BIOPSY POLYPECTOMY  Patient location during evaluation: Phase II Anesthesia Type: General Level of consciousness: awake and alert and oriented Pain management: pain level controlled Vital Signs Assessment: post-procedure vital signs reviewed and stable Respiratory status: spontaneous breathing, nonlabored ventilation and respiratory function stable Cardiovascular status: blood pressure returned to baseline and stable Postop Assessment: no apparent nausea or vomiting Anesthetic complications: no   No notable events documented.   Last Vitals:  Vitals:   10/15/21 1306 10/15/21 1402  BP: 125/71 119/76  Pulse: 68   Resp: 18 17  Temp: 36.8 C 36.8 C  SpO2: 97% 99%    Last Pain:  Vitals:   10/15/21 1402  TempSrc: Oral  PainSc: 0-No pain                 Brittin Belnap C Cobain Morici

## 2021-10-15 NOTE — H&P (Signed)
Primary Care Physician:  Celene Squibb, MD Primary Gastroenterologist:  Dr. Abbey Chatters  Pre-Procedure History & Physical: HPI:  Beth Barber is a 56 y.o. female is here for a colonoscopy to be performed for high risk colon cancer screening purposes, family hx of colon cancer (mother and father)  Past Medical History:  Diagnosis Date   Asthma    Cancer ([place])    Complication of anesthesia    difficult to wake up after anesthesia   GERD (gastroesophageal reflux disease)    Hypertension     Past Surgical History:  Procedure Laterality Date   ABDOMINAL HYSTERECTOMY     PORTACATH PLACEMENT     portacath removal      Prior to Admission medications   Medication Sig Start Date End Date Taking? Authorizing Provider  Borage, Borago officinalis, (BORAGE OIL PO) Take 1 capsule by mouth daily.   Yes [provider]  Cholecalciferol (VITAMIN D) 50 MCG (2000 UT) CAPS Take 4,000 Units by mouth daily.   Yes [provider]  famotidine (PEPCID) 20 MG tablet Take 1 tablet (20 mg total) by mouth 2 (two) times daily. Patient taking differently: Take 20 mg by mouth daily at 6 (six) AM. 06/17/21 10/11/21 Yes Redwine, Madison A, PA-C  famotidine (PEPCID) 20 MG tablet Take 20 mg by mouth daily.   Yes [provider]  fluticasone (FLONASE) 50 MCG/ACT nasal spray Place 1 spray into both nostrils daily as needed for allergies or rhinitis.   Yes [provider]  lisinopril-hydrochlorothiazide (ZESTORETIC) 20-25 MG tablet Take 1 tablet by mouth daily. 07/11/21  Yes [provider]  lisinopril-hydrochlorothiazide (ZESTORETIC) 20-12.5 MG tablet Take 1 tablet by mouth daily. Patient not taking: Reported on 10/11/2021 06/17/21 10/11/21  Redwine, Madison A, PA-C    Allergies as of 10/03/2021 - Review Complete 10/03/2021  Allergen Reaction Noted   Hydrocodone-acetaminophen Hives 06/17/2021    Family History  Problem Relation Age of Onset   Cancer Mother    Cancer Father     Cancer Sister    Cancer Brother     Social History   Socioeconomic History   Marital status: Single    Spouse name: Not on file   Number of children: Not on file   Years of education: Not on file   Highest education level: Not on file  Occupational History   Not on file  Tobacco Use   Smoking status: Former    Types: Cigarettes    Quit date: 05/24/2020    Years since quitting: 1.3   Smokeless tobacco: Never  Vaping Use   Vaping Use: Never used  Substance and Sexual Activity   Alcohol use: Yes    Comment: Pt. states sometimes.   Drug use: Never   Sexual activity: Not Currently  Other Topics Concern   Not on file  Social History Narrative   Not on file   Social Determinants of Health   Financial Resource Strain: Not on file  Food Insecurity: Not on file  Transportation Needs: Not on file  Physical Activity: Not on file  Stress: Not on file  Social Connections: Not on file  Intimate Partner Violence: Not on file    Review of Systems: See HPI, otherwise negative ROS  Physical Exam: Vital signs in last 24 hours: Temp:  [98.3 F (36.8 C)] 98.3 F (36.8 C) (02/27 1306) Pulse Rate:  [68] 68 (02/27 1306) Resp:  [18] 18 (02/27 1306) BP: (125)/(71) 125/71 (02/27 1306) SpO2:  [97 %] 97 % (  02/27 1306)   General:   Alert,  Well-developed, well-nourished, pleasant and cooperative in NAD Head:  Normocephalic and atraumatic. Eyes:  Sclera clear, no icterus.   Conjunctiva pink. Ears:  Normal auditory acuity. Nose:  No deformity, discharge,  or lesions. Mouth:  No deformity or lesions, dentition normal. Neck:  Supple; no masses or thyromegaly. Lungs:  Clear throughout to auscultation.   No wheezes, crackles, or rhonchi. No acute distress. Heart:  Regular rate and rhythm; no murmurs, clicks, rubs,  or gallops. Abdomen:  Soft, nontender and nondistended. No masses, hepatosplenomegaly or hernias noted. Normal bowel sounds, without guarding, and without rebound.   Msk:   Symmetrical without gross deformities. Normal posture. Extremities:  Without clubbing or edema. Neurologic:  Alert and  oriented x4;  grossly normal neurologically. Skin:  Intact without significant lesions or rashes. Cervical Nodes:  No significant cervical adenopathy. Psych:  Alert and cooperative. Normal mood and affect.  Impression/Plan: Beth Barber is here for a colonoscopy to be performed for high risk colon cancer screening purposes, family hx of colon cancer (mother and father)  The risks of the procedure including infection, bleed, or perforation as well as benefits, limitations, alternatives and imponderables have been reviewed with the patient. Questions have been answered. All parties agreeable.

## 2021-10-17 DIAGNOSIS — Z419 Encounter for procedure for purposes other than remedying health state, unspecified: Secondary | ICD-10-CM | POA: Diagnosis not present

## 2021-10-17 LAB — SURGICAL PATHOLOGY

## 2021-10-18 ENCOUNTER — Encounter (HOSPITAL_COMMUNITY): Payer: Self-pay | Admitting: Internal Medicine

## 2021-11-17 DIAGNOSIS — Z419 Encounter for procedure for purposes other than remedying health state, unspecified: Secondary | ICD-10-CM | POA: Diagnosis not present

## 2021-11-22 ENCOUNTER — Ambulatory Visit: Payer: Medicare Other | Admitting: Internal Medicine

## 2021-11-27 DIAGNOSIS — R7303 Prediabetes: Secondary | ICD-10-CM | POA: Diagnosis not present

## 2021-11-27 DIAGNOSIS — I10 Essential (primary) hypertension: Secondary | ICD-10-CM | POA: Diagnosis not present

## 2021-12-04 ENCOUNTER — Other Ambulatory Visit (HOSPITAL_COMMUNITY): Payer: Self-pay | Admitting: Family Medicine

## 2021-12-04 DIAGNOSIS — M545 Low back pain, unspecified: Secondary | ICD-10-CM

## 2021-12-04 DIAGNOSIS — R7303 Prediabetes: Secondary | ICD-10-CM | POA: Diagnosis not present

## 2021-12-04 DIAGNOSIS — M8580 Other specified disorders of bone density and structure, unspecified site: Secondary | ICD-10-CM | POA: Insufficient documentation

## 2021-12-04 DIAGNOSIS — E785 Hyperlipidemia, unspecified: Secondary | ICD-10-CM | POA: Diagnosis not present

## 2021-12-04 DIAGNOSIS — I10 Essential (primary) hypertension: Secondary | ICD-10-CM | POA: Diagnosis not present

## 2021-12-04 DIAGNOSIS — R945 Abnormal results of liver function studies: Secondary | ICD-10-CM | POA: Diagnosis not present

## 2021-12-04 DIAGNOSIS — Z8571 Personal history of Hodgkin lymphoma: Secondary | ICD-10-CM | POA: Diagnosis not present

## 2021-12-04 DIAGNOSIS — E876 Hypokalemia: Secondary | ICD-10-CM | POA: Diagnosis not present

## 2021-12-17 DIAGNOSIS — Z419 Encounter for procedure for purposes other than remedying health state, unspecified: Secondary | ICD-10-CM | POA: Diagnosis not present

## 2021-12-24 ENCOUNTER — Ambulatory Visit (HOSPITAL_COMMUNITY)
Admission: RE | Admit: 2021-12-24 | Discharge: 2021-12-24 | Disposition: A | Discharge status: Home / Self Care | Payer: Medicare Other | Source: Ambulatory Visit | Attending: Family Medicine | Admitting: Family Medicine

## 2021-12-24 DIAGNOSIS — M545 Low back pain, unspecified: Secondary | ICD-10-CM | POA: Insufficient documentation

## 2021-12-24 DIAGNOSIS — M5136 Other intervertebral disc degeneration, lumbar region: Secondary | ICD-10-CM | POA: Diagnosis not present

## 2021-12-26 DIAGNOSIS — M51369 Other intervertebral disc degeneration, lumbar region without mention of lumbar back pain or lower extremity pain: Secondary | ICD-10-CM | POA: Insufficient documentation

## 2021-12-26 DIAGNOSIS — M5136 Other intervertebral disc degeneration, lumbar region: Secondary | ICD-10-CM | POA: Insufficient documentation

## 2022-01-17 DIAGNOSIS — Z419 Encounter for procedure for purposes other than remedying health state, unspecified: Secondary | ICD-10-CM | POA: Diagnosis not present

## 2022-01-21 DIAGNOSIS — M4150 Other secondary scoliosis, site unspecified: Secondary | ICD-10-CM | POA: Diagnosis not present

## 2022-01-29 DIAGNOSIS — M4150 Other secondary scoliosis, site unspecified: Secondary | ICD-10-CM | POA: Diagnosis not present

## 2022-01-29 DIAGNOSIS — M47816 Spondylosis without myelopathy or radiculopathy, lumbar region: Secondary | ICD-10-CM | POA: Diagnosis not present

## 2022-01-29 DIAGNOSIS — M545 Low back pain, unspecified: Secondary | ICD-10-CM | POA: Diagnosis not present

## 2022-02-04 ENCOUNTER — Ambulatory Visit (HOSPITAL_COMMUNITY): Admission: RE | Admit: 2022-02-04 | Payer: Medicare Other | Source: Ambulatory Visit

## 2022-02-16 DIAGNOSIS — Z419 Encounter for procedure for purposes other than remedying health state, unspecified: Secondary | ICD-10-CM | POA: Diagnosis not present

## 2022-03-05 DIAGNOSIS — M5440 Lumbago with sciatica, unspecified side: Secondary | ICD-10-CM | POA: Diagnosis not present

## 2022-03-19 DIAGNOSIS — Z419 Encounter for procedure for purposes other than remedying health state, unspecified: Secondary | ICD-10-CM | POA: Diagnosis not present

## 2022-04-08 ENCOUNTER — Inpatient Hospital Stay: Payer: Medicare Other

## 2022-04-08 ENCOUNTER — Inpatient Hospital Stay: Payer: Medicare Other | Attending: Hematology | Admitting: Hematology

## 2022-04-08 ENCOUNTER — Encounter: Payer: Self-pay | Admitting: Hematology

## 2022-04-08 VITALS — BP 120/78 | HR 69 | Temp 98.2°F | Resp 16 | Ht 66.0 in | Wt 205.6 lb

## 2022-04-08 DIAGNOSIS — Z08 Encounter for follow-up examination after completed treatment for malignant neoplasm: Secondary | ICD-10-CM | POA: Insufficient documentation

## 2022-04-08 DIAGNOSIS — Z8571 Personal history of Hodgkin lymphoma: Secondary | ICD-10-CM | POA: Diagnosis not present

## 2022-04-08 DIAGNOSIS — C8190 Hodgkin lymphoma, unspecified, unspecified site: Secondary | ICD-10-CM

## 2022-04-08 LAB — CBC WITH DIFFERENTIAL/PLATELET
Abs Immature Granulocytes: 0.01 10*3/uL (ref 0.00–0.07)
Basophils Absolute: 0 10*3/uL (ref 0.0–0.1)
Basophils Relative: 0 %
Eosinophils Absolute: 0 10*3/uL (ref 0.0–0.5)
Eosinophils Relative: 1 %
HCT: 39.3 % (ref 36.0–46.0)
Hemoglobin: 12.7 g/dL (ref 12.0–15.0)
Immature Granulocytes: 0 %
Lymphocytes Relative: 35 %
Lymphs Abs: 1.5 10*3/uL (ref 0.7–4.0)
MCH: 26.8 pg (ref 26.0–34.0)
MCHC: 32.3 g/dL (ref 30.0–36.0)
MCV: 83.1 fL (ref 80.0–100.0)
Monocytes Absolute: 0.4 10*3/uL (ref 0.1–1.0)
Monocytes Relative: 10 %
Neutro Abs: 2.4 10*3/uL (ref 1.7–7.7)
Neutrophils Relative %: 54 %
Platelets: 237 10*3/uL (ref 150–400)
RBC: 4.73 MIL/uL (ref 3.87–5.11)
RDW: 16.2 % — ABNORMAL HIGH (ref 11.5–15.5)
WBC: 4.3 10*3/uL (ref 4.0–10.5)
nRBC: 0 % (ref 0.0–0.2)

## 2022-04-08 LAB — COMPREHENSIVE METABOLIC PANEL
ALT: 25 U/L (ref 0–44)
AST: 28 U/L (ref 15–41)
Albumin: 3.8 g/dL (ref 3.5–5.0)
Alkaline Phosphatase: 51 U/L (ref 38–126)
Anion gap: 7 (ref 5–15)
BUN: 12 mg/dL (ref 6–20)
CO2: 25 mmol/L (ref 22–32)
Calcium: 9.4 mg/dL (ref 8.9–10.3)
Chloride: 104 mmol/L (ref 98–111)
Creatinine, Ser: 0.79 mg/dL (ref 0.44–1.00)
GFR, Estimated: >60 mL/min (ref >60)
Glucose, Bld: 105 mg/dL — ABNORMAL HIGH (ref 70–99)
Potassium: 3.9 mmol/L (ref 3.5–5.1)
Sodium: 136 mmol/L (ref 135–145)
Total Bilirubin: 0.5 mg/dL (ref 0.3–1.2)
Total Protein: 8.1 g/dL (ref 6.5–8.1)

## 2022-04-08 LAB — SEDIMENTATION RATE: Sed Rate: 44 mm/hr — ABNORMAL HIGH (ref 0–22)

## 2022-04-08 LAB — LACTATE DEHYDROGENASE: LDH: 144 U/L (ref 98–192)

## 2022-04-08 NOTE — Patient Instructions (Addendum)
[place] at [facility] Discharge Instructions  You were seen and examined today by Dr. Delton Coombes. Dr. Delton Coombes is a medical oncologist, meaning that he specializes in the treatment of cancer diagnoses. Dr. Delton Coombes discussed your past medical history, family history of cancers, and the events that led to you being here today.  You were referred to Dr. Delton Coombes for ongoing management of your previous lymphoma diagnosis.  Dr. Delton Coombes has recommended routine labs today. Assuming they are okay, Dr. Delton Coombes will see you back in 1 year with labs prior to your next appointment with him.  Follow-up as scheduled.  Thank you for choosing [place] at [facility] to provide your oncology and hematology care.  To afford each patient quality time with our provider, please arrive at least 15 minutes before your scheduled appointment time.   If you have a lab appointment with the [facility] please come in thru the Main Entrance and check in at the main information desk.  You need to re-schedule your appointment should you arrive 10 or more minutes late.  We strive to give you quality time with our providers, and arriving late affects you and other patients whose appointments are after yours.  Also, if you no show three or more times for appointments you may be dismissed from the clinic at the providers discretion.     Again, thank you for choosing [facility].  Our hope is that these requests will decrease the amount of time that you wait before being seen by our physicians.       _____________________________________________________________  Should you have questions after your visit to [facility], please contact our office at [phone] and follow the prompts.  Our office hours are 8:00 a.m. and 4:30 p.m. Monday - Friday.  Please note that voicemails left after 4:00 p.m. may not be returned until the following  business day.  We are closed weekends and major holidays.  You do have access to a nurse 24-7, just call the main number to the clinic [phone] and do not press any options, hold on the line and a nurse will answer the phone.    For prescription refill requests, have your pharmacy contact our office and allow 72 hours.

## 2022-04-08 NOTE — Progress Notes (Signed)
[facility] NOTE  Patient Care Team: Celene Squibb, MD as PCP - General (Internal Medicine) Derek Jack, MD as Medical Oncologist (Medical Oncology)  CHIEF COMPLAINTS/PURPOSE OF CONSULTATION:  History of stage IIb Hodgkin's lymphoma  HISTORY OF PRESENTING ILLNESS:  Beth Barber 56 y.o. female is seen in consultation today at the request of Valentino Nose, FNP for follow-up of patient with history of Hodgkin's lymphoma.  She was previously evaluated by Dr. Lazaro Arms in [place], last visit in July 2022.  Last CT CAP on 07/25/2018 was negative for recurrent lymphoma.  She has moved to [place] in August 2022 and is currently taking care of her sister.  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  She was also 1 pack/day smoker starting at age 55 and quit in 2021.  She denies any fevers, night sweats or weight loss in the last 6 months.  She does report having some hot flashes.  MEDICAL HISTORY:  Past Medical History:  Diagnosis Date   Asthma    Cancer ([place])    Complication of anesthesia    difficult to wake up after anesthesia   GERD (gastroesophageal reflux disease)    Hypertension     SURGICAL HISTORY: Past Surgical History:  Procedure Laterality Date   ABDOMINAL HYSTERECTOMY     BIOPSY  10/15/2021   Procedure: BIOPSY;  Surgeon: Eloise Harman, DO;  [facility];  Service: Endoscopy;;   COLONOSCOPY WITH PROPOFOL N/A 10/15/2021   Procedure: COLONOSCOPY WITH PROPOFOL;  Surgeon: Eloise Harman, DO;  [facility];  Service: Endoscopy;  Laterality: N/A;  2:30 / ASA 3   POLYPECTOMY  10/15/2021   Procedure: POLYPECTOMY;  Surgeon: Eloise Harman, DO;  Location: [facility];  Service: Endoscopy;;   PORTACATH PLACEMENT     portacath removal      SOCIAL HISTORY: Social History   Socioeconomic History   Marital status: Single    Spouse name: Not on file   Number of children: Not on file   Years  of education: Not on file   Highest education level: Not on file  Occupational History   Not on file  Tobacco Use   Smoking status: Former    Types: Cigarettes    Quit date: 05/24/2020    Years since quitting: 1.8   Smokeless tobacco: Never  Vaping Use   Vaping Use: Never used  Substance and Sexual Activity   Alcohol use: Yes    Comment: Pt. states sometimes.   Drug use: Never   Sexual activity: Not Currently  Other Topics Concern   Not on file  Social History Narrative   Not on file   Social Determinants of Health   Financial Resource Strain: Not on file  Food Insecurity: Not on file  Transportation Needs: Not on file  Physical Activity: Not on file  Stress: Not on file  Social Connections: Not on file  Intimate Partner Violence: Not on file    FAMILY HISTORY: Family History  Problem Relation Age of Onset   Cancer Mother    Cancer Father    Cancer Sister    Cancer Brother     ALLERGIES:  is allergic to hydrocodone-acetaminophen.  MEDICATIONS:  Current Outpatient Medications  Medication Sig Dispense Refill   famotidine (PEPCID) 20 MG tablet Take 20 mg by mouth daily.     lisinopril-hydrochlorothiazide (ZESTORETIC) 20-25 MG tablet Take 1 tablet by mouth daily.  Borage, Borago officinalis, (BORAGE OIL PO) Take 1 capsule by mouth daily. (Patient not taking: Reported on 04/08/2022)     Cholecalciferol (VITAMIN D) 50 MCG (2000 UT) CAPS Take 4,000 Units by mouth daily. (Patient not taking: Reported on 04/08/2022)     No current facility-administered medications for this visit.    REVIEW OF SYSTEMS:   Constitutional: Denies fevers, chills or abnormal night sweats Eyes: Denies blurriness of vision, double vision or watery eyes Ears, nose, mouth, throat, and face: Denies mucositis or sore throat Respiratory: Positive for cough and shortness of breath on exertion. Cardiovascular: Denies palpitation, chest discomfort or lower extremity swelling Gastrointestinal:  Positive for constipation. Skin: Denies abnormal skin rashes Lymphatics: Denies new lymphadenopathy or easy bruising Neurological: Positive for on and off numbness in the hands. Behavioral/Psych: Mood is stable, no new changes  All other systems were reviewed with the patient and are negative.  PHYSICAL EXAMINATION: ECOG PERFORMANCE STATUS: 0 - Asymptomatic  Vitals:   04/08/22 0817  BP: 120/78  Pulse: 69  Resp: 16  Temp: 98.2 F (36.8 C)  SpO2: 98%   Filed Weights   04/08/22 0817  Weight: 205 lb 9.6 oz (93.3 kg)    GENERAL:alert, no distress and comfortable SKIN: skin color, texture, turgor are normal, no rashes or significant lesions EYES: normal, conjunctiva are pink and non-injected, sclera clear OROPHARYNX:no exudate, no erythema and lips, buccal mucosa, and tongue normal  NECK: supple, thyroid normal size, non-tender, without nodularity LYMPH:  no palpable lymphadenopathy in the cervical, axillary or inguinal.  Small shotty lymph nodes in the left supraclavicular region at the site of biopsy. LUNGS: clear to auscultation and percussion with normal breathing effort HEART: regular rate & rhythm and no murmurs and no lower extremity edema ABDOMEN:abdomen soft, non-tender and normal bowel sounds Musculoskeletal:no cyanosis of digits and no clubbing  PSYCH: alert & oriented x 3 with fluent speech NEURO: no focal motor/sensory deficits  LABORATORY DATA:  I have reviewed the data as listed Lab Results  Component Value Date   WBC 4.3 04/08/2022   HGB 12.7 04/08/2022   HCT 39.3 04/08/2022   MCV 83.1 04/08/2022   PLT 237 04/08/2022     Chemistry      Component Value Date/Time   NA 136 04/08/2022 0848   K 3.9 04/08/2022 0848   CL 104 04/08/2022 0848   CO2 25 04/08/2022 0848   BUN 12 04/08/2022 0848   CREATININE 0.79 04/08/2022 0848      Component Value Date/Time   CALCIUM 9.4 04/08/2022 0848   ALKPHOS 51 04/08/2022 0848   AST 28 04/08/2022 0848   ALT 25  04/08/2022 0848   BILITOT 0.5 04/08/2022 0848       RADIOGRAPHIC STUDIES: I have personally reviewed the radiological images as listed and agreed with the findings in the report. No results found.  ASSESSMENT:  1.  Stage IIb Hodgkin's lymphoma: - Presentation with left supraclavicular lymphadenopathy. - Status post ABVD for 6 months completed in 2012-10-20. - Last CT CAP (07/25/2018): Negative for recurrent lymphoma - Last seen by Dr. Lazaro Arms in [place] on 03/01/2021.  2.  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  She had exposure to asbestos.  She recently moved to [place] in August 2022.  She takes care of her sister.  She is a 1 pack/day smoker for 34 years, quit in 21-Oct-2019. - Mother died of colon cancer but also had throat cancer and breast cancer.  Father  had cancer, type not known to the patient.  Brother had throat cancer.  Sister had?  Sarcoma of the thigh with metastasis to the lungs.   PLAN:  1.  Stage IIb Hodgkin's lymphoma: - She denies any fevers or night sweats.  She lost about 20 pounds in the last 6 months but was trying to lose weight with diet modification. - Physical examination today shows small shotty lymph nodes at the site of previous biopsy in the left supraclavicular area.  No other lymphadenopathy or splenomegaly palpable. - Reviewed labs from today which shows normal LDH, CBC, LFTs.  ESR is minimally elevated at 44.  Previous ESR values were also elevated in [place]. - No evidence of recurrence at this time.  No scans required.  RTC 1 year for follow-up with same labs.   Orders Placed This Encounter  Procedures   CBC with Differential    Standing Status:   Future    Number of Occurrences:   1    Standing Expiration Date:   04/08/2023   Comprehensive metabolic panel    Standing Status:   Future    Number of Occurrences:   1    Standing Expiration Date:   04/08/2023   Lactate dehydrogenase    Standing Status:    Future    Number of Occurrences:   1    Standing Expiration Date:   04/08/2023   Sedimentation rate    Standing Status:   Future    Number of Occurrences:   1    Standing Expiration Date:   04/08/2023   CBC with Differential    Standing Status:   Future    Standing Expiration Date:   04/08/2023   Comprehensive metabolic panel    Standing Status:   Future    Standing Expiration Date:   04/08/2023   Lactate dehydrogenase    Standing Status:   Future    Standing Expiration Date:   04/08/2023   Sedimentation rate    Standing Status:   Future    Standing Expiration Date:   04/08/2023    All questions were answered. The patient knows to call the clinic with any problems, questions or concerns.      Derek Jack, MD 04/08/2022 5:14 PM

## 2022-04-15 ENCOUNTER — Ambulatory Visit: Payer: Medicare Other | Admitting: Hematology

## 2022-05-27 DIAGNOSIS — I10 Essential (primary) hypertension: Secondary | ICD-10-CM | POA: Diagnosis not present

## 2022-05-27 DIAGNOSIS — E785 Hyperlipidemia, unspecified: Secondary | ICD-10-CM | POA: Diagnosis not present

## 2022-05-27 DIAGNOSIS — E559 Vitamin D deficiency, unspecified: Secondary | ICD-10-CM | POA: Diagnosis not present

## 2022-05-27 DIAGNOSIS — R7303 Prediabetes: Secondary | ICD-10-CM | POA: Diagnosis not present

## 2022-06-06 DIAGNOSIS — E785 Hyperlipidemia, unspecified: Secondary | ICD-10-CM | POA: Diagnosis not present

## 2022-06-06 DIAGNOSIS — L309 Dermatitis, unspecified: Secondary | ICD-10-CM | POA: Diagnosis not present

## 2022-06-06 DIAGNOSIS — M8580 Other specified disorders of bone density and structure, unspecified site: Secondary | ICD-10-CM | POA: Diagnosis not present

## 2022-06-06 DIAGNOSIS — E559 Vitamin D deficiency, unspecified: Secondary | ICD-10-CM | POA: Diagnosis not present

## 2022-06-06 DIAGNOSIS — Z8571 Personal history of Hodgkin lymphoma: Secondary | ICD-10-CM | POA: Diagnosis not present

## 2022-06-06 DIAGNOSIS — E876 Hypokalemia: Secondary | ICD-10-CM | POA: Diagnosis not present

## 2022-06-06 DIAGNOSIS — M545 Low back pain, unspecified: Secondary | ICD-10-CM | POA: Diagnosis not present

## 2022-06-06 DIAGNOSIS — R7303 Prediabetes: Secondary | ICD-10-CM | POA: Diagnosis not present

## 2022-06-06 DIAGNOSIS — I10 Essential (primary) hypertension: Secondary | ICD-10-CM | POA: Diagnosis not present

## 2022-06-06 NOTE — Progress Notes (Signed)
Sabrina with Dr. Juel Burrow office called and left message asking if Dr. Delton Coombes was going to order patient's xray that she has done yearly or if they need to be the ones to do this. Left message informing Gabriel Cirri that Dr. Delton Coombes has only seen patient once and that they can order this for her if they will since she does not have a follow up with [place] until 04/16/2023.

## 2022-07-15 DIAGNOSIS — L301 Dyshidrosis [pompholyx]: Secondary | ICD-10-CM | POA: Insufficient documentation

## 2022-07-15 DIAGNOSIS — L308 Other specified dermatitis: Secondary | ICD-10-CM | POA: Diagnosis not present

## 2022-08-01 ENCOUNTER — Emergency Department (HOSPITAL_COMMUNITY)
Admission: EM | Admit: 2022-08-01 | Discharge: 2022-08-01 | Disposition: A | Discharge status: Home / Self Care | Payer: Medicare Other | Attending: Student | Admitting: Student

## 2022-08-01 ENCOUNTER — Other Ambulatory Visit: Payer: Self-pay

## 2022-08-01 ENCOUNTER — Encounter (HOSPITAL_COMMUNITY): Payer: Self-pay | Admitting: Emergency Medicine

## 2022-08-01 ENCOUNTER — Emergency Department (HOSPITAL_COMMUNITY): Payer: Medicare Other

## 2022-08-01 DIAGNOSIS — J45909 Unspecified asthma, uncomplicated: Secondary | ICD-10-CM | POA: Insufficient documentation

## 2022-08-01 DIAGNOSIS — Z87891 Personal history of nicotine dependence: Secondary | ICD-10-CM | POA: Insufficient documentation

## 2022-08-01 DIAGNOSIS — R0602 Shortness of breath: Secondary | ICD-10-CM | POA: Diagnosis not present

## 2022-08-01 DIAGNOSIS — Z79899 Other long term (current) drug therapy: Secondary | ICD-10-CM | POA: Insufficient documentation

## 2022-08-01 DIAGNOSIS — I10 Essential (primary) hypertension: Secondary | ICD-10-CM | POA: Diagnosis not present

## 2022-08-01 DIAGNOSIS — R21 Rash and other nonspecific skin eruption: Secondary | ICD-10-CM | POA: Diagnosis present

## 2022-08-01 DIAGNOSIS — L301 Dyshidrosis [pompholyx]: Secondary | ICD-10-CM | POA: Insufficient documentation

## 2022-08-01 MED ORDER — CLOBETASOL PROPIONATE 0.05 % EX OINT
TOPICAL_OINTMENT | Freq: Two times a day (BID) | CUTANEOUS | Status: DC
  Filled 2022-08-01: qty 15

## 2022-08-01 MED ORDER — METHYLPREDNISOLONE 4 MG PO TBPK: ORAL_TABLET | ORAL | 0 refills | Status: DC

## 2022-08-01 MED ORDER — PREDNISONE 50 MG PO TABS
60.0000 mg | ORAL_TABLET | Freq: Once | ORAL | Status: AC
  Administered 2022-08-01: 60 mg via ORAL
  Filled 2022-08-01: qty 1

## 2022-08-01 NOTE — ED Triage Notes (Signed)
Pt reports intermittent shortness of breath x 2 months that she feels is from her neighbor smoking inside the apartment, pt has been seen by PCP for same and told she needs a chest xray, pt further endorses bilateral hand rash x 2 months that PCP says is eczema, pt states she was given cream with no improvement

## 2022-08-01 NOTE — ED Provider Notes (Signed)
[facility] EMERGENCY DEPARTMENT Provider Note  CSN: [id number] Arrival date & time: 08/01/22 1026  Chief Complaint(s) Shortness of Breath  HPI Beth Barber is a 56 y.o. female PMH asthma, GERD, HTN, eczema who presents emergency department for evaluation of multiple complaints including shortness of breath and a rash on the hands.  Patient has been previously diagnosed with eczema of the hands and has failed both Kenalog cream and clobetasol.  She has also been endorsing shortness of breath to her primary care doctor who told her to come to the ER for a chest x-ray.  On my evaluation she has no shortness of breath at all.  She does endorse worsening blistering of the hands and itchiness of the hands.  Denies chest pain, headache, fever or other systemic symptoms.   Past Medical History Past Medical History:  Diagnosis Date   Asthma    Cancer ([place])    Complication of anesthesia    difficult to wake up after anesthesia   GERD (gastroesophageal reflux disease)    Hypertension    There are no problems to display for this patient.  Home Medication(s) Prior to Admission medications   Medication Sig Start Date End Date Taking? Authorizing Provider  methylPREDNISolone (MEDROL DOSEPAK) 4 MG TBPK tablet Take as prescribed 08/01/22  Yes Shaheen Star, MD  Borage, Borago officinalis, (BORAGE OIL PO) Take 1 capsule by mouth daily. Patient not taking: Reported on 04/08/2022    [provider]  Cholecalciferol (VITAMIN D) 50 MCG (2000 UT) CAPS Take 4,000 Units by mouth daily. Patient not taking: Reported on 04/08/2022    [provider]  famotidine (PEPCID) 20 MG tablet Take 20 mg by mouth daily.    [provider]  lisinopril-hydrochlorothiazide (ZESTORETIC) 20-25 MG tablet Take 1 tablet by mouth daily. 07/11/21   [provider]                                                                                                                                     Past Surgical History Past Surgical History:  Procedure Laterality Date   ABDOMINAL HYSTERECTOMY     BIOPSY  10/15/2021   Procedure: BIOPSY;  Surgeon: Eloise Harman, DO;  [facility]: [facility];  Service: Endoscopy;;   COLONOSCOPY WITH PROPOFOL N/A 10/15/2021   Procedure: COLONOSCOPY WITH PROPOFOL;  Surgeon: Eloise Harman, DO;  Location: [facility];  Service: Endoscopy;  Laterality: N/A;  2:30 / ASA 3   POLYPECTOMY  10/15/2021   Procedure: POLYPECTOMY;  Surgeon: Eloise Harman, DO;  Location: [facility];  Service: Endoscopy;;   PORTACATH PLACEMENT     portacath removal     Family History Family History  Problem Relation Age of Onset   Cancer Mother    Cancer Father    Cancer Sister    Cancer Brother     Social History Social History   Tobacco Use  Smoking status: Former    Types: Cigarettes    Quit date: 05/24/2020    Years since quitting: 2.1   Smokeless tobacco: Never  Vaping Use   Vaping Use: Never used  Substance Use Topics   Alcohol use: Yes    Comment: Pt. states sometimes.   Drug use: Never   Allergies Hydrocodone-acetaminophen  Review of Systems Review of Systems  Skin:  Positive for rash.    Physical Exam Vital Signs  I have reviewed the triage vital signs BP (!) 160/89 (BP Location: Right Arm)   Pulse 87   Temp 98.7 F (37.1 C) (Oral)   Resp 16   Ht '5\' 5"'$  (1.651 m)   Wt 93 kg   SpO2 100%   BMI 34.11 kg/m   Physical Exam Vitals and nursing note reviewed.  Constitutional:      General: She is not in acute distress.    Appearance: She is well-developed.  HENT:     Head: Normocephalic and atraumatic.  Eyes:     Conjunctiva/sclera: Conjunctivae normal.  Cardiovascular:     Rate and Rhythm: Normal rate and regular rhythm.     Heart sounds: No murmur heard. Pulmonary:     Effort: Pulmonary effort is normal. No respiratory distress.     Breath sounds: Normal breath sounds.  Abdominal:     Palpations: Abdomen is soft.      Tenderness: There is no abdominal tenderness.  Musculoskeletal:        General: No swelling.     Cervical back: Neck supple.  Skin:    General: Skin is warm and dry.     Capillary Refill: Capillary refill takes less than 2 seconds.     Findings: Rash present.  Neurological:     Mental Status: She is alert.  Psychiatric:        Mood and Affect: Mood normal.     ED Results and Treatments Labs (all labs ordered are listed, but only abnormal results are displayed) Labs Reviewed - No data to display                                                                                                                        Radiology DG Chest [facility] 1 View  Result Date: 08/01/2022 CLINICAL DATA:  Shortness of breath EXAM: PORTABLE CHEST 1 VIEW COMPARISON:  None Available. FINDINGS: Artifact from patient's hair. Postoperative left supraclavicular fossa with clustered clips. There is no edema, consolidation, effusion, or pneumothorax. Normal heart size and mediastinal contours. IMPRESSION: Negative chest. Electronically Signed   By: Jorje Guild M.D.   On: 08/01/2022 11:13    Pertinent labs & imaging results that were available during my care of the patient were reviewed by me and considered in my medical decision making (see MDM for details).  Medications Ordered in ED Medications  predniSONE (DELTASONE) tablet 60 mg (60 mg Oral Given 08/01/22 1421)  Procedures Procedures  (including critical care time)  Medical Decision Making / ED Course   This patient presents to the ED for concern of rash, this involves an extensive number of treatment options, and is a complaint that carries with it a high risk of complications and morbidity.  The differential diagnosis includes dyshidrotic eczema, nummular eczema, erythema multiforme, scabies  MDM: Patient seen  emerged part for evaluation of a rash.  Physical exam revealing multiple vesicular and dry flaky lesions of bilateral hands that extends to the wrist.  Chest x-ray requested by primary care physician is reassuringly negative.  Patient presentation appears to be consistent with dyshidrotic eczema and she is currently on maximal therapy with her clobetasol.  I spoke with the dermatology resident at [facility] Dr. Howard Pouch who states that oral steroids can be considered but they do have a higher risk of recurrent flares.  He primarily needs outpatient dermatology follow-up for consideration of methotrexate versus Dupixent.  At a long discussion with the patient and as she has been compliant with her topical therapies, we will bridge the patient with a short steroid taper to get the patient to dermatology clinic.  She states that she will follow-up in [place] with the dermatologist her primary care recommended and if insurance issues continue to be a problem for her she will follow-up outpatient with [facility].  Patient then discharged with Medrol Dosepak.   Additional history obtained:  -External records from outside source obtained and reviewed including: Chart review including previous notes, labs, imaging, consultation notes     Imaging Studies ordered: I ordered imaging studies including CXR I independently visualized and interpreted imaging. I agree with the radiologist interpretation   Medicines ordered and prescription drug management: Meds ordered this encounter  Medications   DISCONTD: clobetasol ointment (TEMOVATE) 0.05 %   predniSONE (DELTASONE) tablet 60 mg   methylPREDNISolone (MEDROL DOSEPAK) 4 MG TBPK tablet    Sig: Take as prescribed    Dispense:  1 each    Refill:  0    -I have reviewed the patients home medicines and have made adjustments as needed  Critical interventions none  Consultations Obtained: I requested consultation with the dermatology resident at [facility] Dr. Howard Pouch,  and discussed lab and imaging findings as well as pertinent plan - they recommend: Outpatient dermatology follow-up   Cardiac Monitoring: The patient was maintained on a cardiac monitor.  I personally viewed and interpreted the cardiac monitored which showed an underlying rhythm of: NSR  Social Determinants of Health:  Factors impacting patients care include: Insurance issues regarding co-pays surrounding local dermatology   Reevaluation: After the interventions noted above, I reevaluated the patient and found that they have :stayed the same  Co morbidities that complicate the patient evaluation  Past Medical History:  Diagnosis Date   Asthma    Cancer ([place])    Complication of anesthesia    difficult to wake up after anesthesia   GERD (gastroesophageal reflux disease)    Hypertension       Dispostion: I considered admission for this patient, but she currently does not meet inpatient criteria for admission she is safe for discharge the patient follow-up     Final Clinical Impression(s) / ED Diagnoses Final diagnoses:  Dyshidrotic eczema     '@PCDICTATION'$ @    Teressa Lower, MD 08/01/22 1805

## 2022-08-02 ENCOUNTER — Telehealth: Payer: Self-pay | Admitting: *Deleted

## 2022-08-02 NOTE — Patient Outreach (Signed)
  Care Coordination [facility] Note Transition Care Management Follow-up Telephone Call Date of discharge and from where: 08/01/22 from [facility] ED How have you been since you were released from the hospital? "I am doing good" Any questions or concerns? No  Items Reviewed: Did the pt receive and understand the discharge instructions provided? Yes  Medications obtained and verified? Yes  Other? Yes  Any new allergies since your discharge? No  Dietary orders reviewed? Yes Do you have support at home? Yes   Home Care and Equipment/Supplies: Were home health services ordered? no If so, what is the name of the agency? N/A  Has the agency set up a time to come to the patient's home? not applicable Were any new equipment or medical supplies ordered?  No What is the name of the medical supply agency? N/A Were you able to get the supplies/equipment? not applicable Do you have any questions related to the use of the equipment or supplies? No  Functional Questionnaire: (I = Independent and D = Dependent) ADLs: I  Bathing/Dressing- I  Meal Prep- I  Eating- I  Maintaining continence- I  Transferring/Ambulation- I  Managing Meds- I  Follow up appointments reviewed:  PCP Hospital f/u appt confirmed?  N/A-ED visit  . [facility] f/u appt confirmed? Yes  Scheduled to see Dr. Allyn Kenner on 08/27/21 . Are transportation arrangements needed? No  If their condition worsens, is the pt aware to call PCP or go to the Emergency Dept.? Yes Was the patient provided with contact information for the PCP's office or ED? No Was to pt encouraged to call back with questions or concerns? Yes   Lurena Joiner RN, BSN [place]  [employer] manager

## 2022-08-07 DIAGNOSIS — L308 Other specified dermatitis: Secondary | ICD-10-CM | POA: Diagnosis not present

## 2022-08-07 DIAGNOSIS — L301 Dyshidrosis [pompholyx]: Secondary | ICD-10-CM | POA: Diagnosis not present

## 2022-08-14 ENCOUNTER — Other Ambulatory Visit: Payer: Medicare Other | Admitting: *Deleted

## 2022-08-14 NOTE — Patient Outreach (Signed)
Care Coordination  08/14/2022  Beth Barber Dec 09, 1965 [id number]    Successful outreach with Beth Barber. However, upon chart review and discussion with Beth Barber, she does not have a Managed Medicaid plan. RNCM explained to Beth Barber that this RNCM would not be the appropriate delegated Case Manager. Beth Barber expressed understanding.   Lurena Joiner RN, BSN [place]  [employer] manager

## 2022-08-15 ENCOUNTER — Telehealth: Payer: Self-pay | Admitting: *Deleted

## 2022-08-15 NOTE — Progress Notes (Signed)
  Care Coordination   Note   08/15/2022 Name: Beth Barber MRN: [id number] DOB: 10-Dec-1965  Beth Barber is a 56 y.o. year old female who sees Nevada Crane, Edwinna Areola, MD for primary care. I reached out to [name] by phone today to offer care coordination services.  Ms. Fabre was given information about Care Coordination services today including:   The Care Coordination services include support from the care team which includes your Nurse Coordinator, Clinical Social Worker, or Pharmacist.  The Care Coordination team is here to help remove barriers to the health concerns and goals most important to you. Care Coordination services are voluntary, and the patient may decline or stop services at any time by request to their care team member.   Care Coordination Consent Status: Patient agreed to services and verbal consent obtained.   Follow up plan:  Telephone appointment with care coordination team member scheduled for:  09/02/22  Encounter Outcome:  Pt. Scheduled  [place]  Direct Dial: [phone]

## 2022-08-26 DIAGNOSIS — J45901 Unspecified asthma with (acute) exacerbation: Secondary | ICD-10-CM | POA: Diagnosis not present

## 2022-08-26 DIAGNOSIS — J45909 Unspecified asthma, uncomplicated: Secondary | ICD-10-CM | POA: Diagnosis not present

## 2022-09-02 ENCOUNTER — Ambulatory Visit: Payer: Self-pay

## 2022-09-02 NOTE — Patient Outreach (Signed)
  Care Coordination   Follow Up Visit Note   09/02/2022 Name: Beth Barber MRN: [id number] DOB: Jun 13, 1966  Beth Barber is a 57 y.o. year old female who sees Beth Barber, Beth Areola, MD for primary care. I spoke with  Beth Barber by phone today.  What matters to the patients health and wellness today?  Managing her blood pressure and pre-diabetes    Goals Addressed             This Visit's Progress    Patient Stated:  Education and management of health conditions       Care Coordination Interventions: Evaluation of current treatment plan related to hypertension / pre-diabetes and patient's adherence to plan as established by provider:  Patient states she is concerned about managing her sodium intake due to her blood pressure. She reports having blood pressure readings at times that she feels is high.  Reported blood pressure readings of 147/89, 144/72 also reports blood pressure reading of 106/79.  Patient states she is pre-diabetic and would like information on how to manage this condition. Patient states she also needs to see and eye doctor and dentist. She reports having [employer] dual complete plan and states when she has attempted to contact her plan navigator for assistance with participating providers she does not get a return call.  She states she was advised by [facility] to look on their website. Patient states she doesn't know how to do this due to her limited education. Reviewed medications with patient and discussed importance of compliance Reviewed scheduled/upcoming provider appointments  Discussed/ reviewed patients blood pressure readings.   Explained to patient how to read blood pressure monitor for blood pressure and pulse rate Patient advised to take blood pressure readings and blood pressure monitor to next appointment with primary care provider.  Explained to patient that her primary care provider can calibrate monitor if needed.  Mailed patient education article on hypertension, low sodium diet  and prediabetes. Confirmed patient has assistance with reading and understanding articles.  Advised patient, providing education and rationale, to monitor blood pressure daily and record, calling PCP for findings outside established parameters. Discussed normal parameters with patient.  Advised patient to call her [facility] health plan back and request a mailed copy of participating providers for dental and vision from customer service representative.  Notified [facility] clinical manager Kingsley Callander [place] of patients inability to establish contact with her [facility] dual complete navigator Discussed plans with patient for ongoing care management follow up and provided patient with direct contact information for care management team:  Patient verbally agreed to telephone follow up call with RNCM on 10/01/22.           SDOH assessments and interventions completed:  Yes  SDOH Interventions Today    Flowsheet Row Most Recent Value  SDOH Interventions   Food Insecurity Interventions Intervention Not Indicated  Transportation Interventions Intervention Not Indicated        Care Coordination Interventions:  Yes, provided   Follow up plan: Follow up call scheduled for 10/01/22    Encounter Outcome:  Pt. Visit Completed   Quinn Plowman RN,BSN,CCM [place] [phone] direct line

## 2022-09-02 NOTE — Patient Instructions (Signed)
Visit Information  Thank you for taking time to visit with me today. Please don't hesitate to contact me if I can be of assistance to you.   Following are the goals we discussed today:   Goals Addressed             This Visit's Progress    Patient Stated:  Education and management of health conditions       Care Coordination Interventions: Evaluation of current treatment plan related to hypertension / pre-diabetes and patient's adherence to plan as established by provider:   Reviewed medications with patient and discussed importance of compliance Reviewed scheduled/upcoming provider appointments  Discussed/ reviewed patients blood pressure readings.   Explained to patient how to read blood pressure monitor for blood pressure and pulse rate Patient advised to take blood pressure readings and blood pressure monitor to next appointment with primary care provider.  Explained to patient that her primary care provider can calibrate monitor if needed.  Mailed patient education article on hypertension, low sodium diet and prediabetes. Confirmed patient has assistance with reading and understanding articles.  Advised patient, providing education and rationale, to monitor blood pressure daily and record, calling PCP for findings outside established parameters. Discussed normal parameters with patient.  Advised patient to call her [facility] health plan back and request a mailed copy of participating providers for dental and vision from customer service representative.  Notified [facility] clinical manager Kingsley Callander [place]ium of patients inability to establish contact with her [facility] dual complete navigator Discussed plans with patient for ongoing care management follow up and provided patient with direct contact information for care management team:  Patient verbally agreed to telephone follow up call with RNCM on 10/01/22.           Our next appointment is by telephone on 10/01/22 at 11 am  Please call the care  guide team at [phone] if you need to cancel or reschedule your appointment.   If you are experiencing a Mental Health or [place] or need someone to talk to, please call the Suicide and Crisis Lifeline: 988 call 1-800-273-TALK (toll free, 24 hour hotline)  The patient verbalized understanding of instructions, educational materials, and care plan provided today and agreed to receive a mailed copy of patient instructions, educational materials, and care plan.   Quinn Plowman RN,BSN,CCM [place] Coordination [phone] direct line  Managing Your Hypertension Hypertension, also called high blood pressure, is when the force of the blood pressing against the walls of the arteries is too strong. Arteries are blood vessels that carry blood from your heart throughout your body. Hypertension forces the heart to work harder to pump blood and may cause the arteries to become narrow or stiff. Understanding blood pressure readings A blood pressure reading includes a higher number over a lower number: The first, or top, number is called the systolic pressure. It is a measure of the pressure in your arteries as your heart beats. The second, or bottom number, is called the diastolic pressure. It is a measure of the pressure in your arteries as the heart relaxes. For most people, a normal blood pressure is below 120/80. Your personal target blood pressure may vary depending on your medical conditions, your age, and other factors. Blood pressure is classified into four stages. Based on your blood pressure reading, your health care provider may use the following stages to determine what type of treatment you need, if any. Systolic pressure and diastolic pressure are measured in a unit called  millimeters of mercury (mmHg). Normal Systolic pressure: below 989. Diastolic pressure: below 80. Elevated Systolic pressure: 211-941. Diastolic pressure: below 80. Hypertension stage 1 Systolic  pressure: 740-814. Diastolic pressure: 48-18. Hypertension stage 2 Systolic pressure: 563 or above. Diastolic pressure: 90 or above. How can this condition affect me? Managing your hypertension is very important. Over time, hypertension can damage the arteries and decrease blood flow to parts of the body, including the brain, heart, and kidneys. Having untreated or uncontrolled hypertension can lead to: A heart attack. A stroke. A weakened blood vessel (aneurysm). Heart failure. Kidney damage. Eye damage. Memory and concentration problems. Vascular dementia. What actions can I take to manage this condition? Hypertension can be managed by making lifestyle changes and possibly by taking medicines. Your health care provider will help you make a plan to bring your blood pressure within a normal range. You may be referred for counseling on a healthy diet and physical activity. Nutrition  Eat a diet that is high in fiber and potassium, and low in salt (sodium), added sugar, and fat. An example eating plan is called the DASH diet. DASH stands for Dietary Approaches to Stop Hypertension. To eat this way: Eat plenty of fresh fruits and vegetables. Try to fill one-half of your plate at each meal with fruits and vegetables. Eat whole grains, such as whole-wheat pasta, brown rice, or whole-grain bread. Fill about one-fourth of your plate with whole grains. Eat low-fat dairy products. Avoid fatty cuts of meat, processed or cured meats, and poultry with skin. Fill about one-fourth of your plate with lean proteins such as fish, chicken without skin, beans, eggs, and tofu. Avoid pre-made and processed foods. These tend to be higher in sodium, added sugar, and fat. Reduce your daily sodium intake. Many people with hypertension should eat less than 1,500 mg of sodium a day. Lifestyle  Work with your health care provider to maintain a healthy body weight or to lose weight. Ask what an ideal weight is for  you. Get at least 30 minutes of exercise that causes your heart to beat faster (aerobic exercise) most days of the week. Activities may include walking, swimming, or biking. Include exercise to strengthen your muscles (resistance exercise), such as weight lifting, as part of your weekly exercise routine. Try to do these types of exercises for 30 minutes at least 3 days a week. Do not use any products that contain nicotine or tobacco. These products include cigarettes, chewing tobacco, and vaping devices, such as e-cigarettes. If you need help quitting, ask your health care provider. Control any long-term (chronic) conditions you have, such as high cholesterol or diabetes. Identify your sources of stress and find ways to manage stress. This may include meditation, deep breathing, or making time for fun activities. Alcohol use Do not drink alcohol if: Your health care provider tells you not to drink. You are pregnant, may be pregnant, or are planning to become pregnant. If you drink alcohol: Limit how much you have to: 0-1 drink a day for women. 0-2 drinks a day for men. Know how much alcohol is in your drink. In the [place] one drink equals one 12 oz bottle of beer (355 mL), one 5 oz glass of wine (148 mL), or one 1 oz glass of hard liquor (44 mL). Medicines Your health care provider may prescribe medicine if lifestyle changes are not enough to get your blood pressure under control and if: Your systolic blood pressure is 130 or higher. Your diastolic blood pressure  is 80 or higher. Take medicines only as told by your health care provider. Follow the directions carefully. Blood pressure medicines must be taken as told by your health care provider. The medicine does not work as well when you skip doses. Skipping doses also puts you at risk for problems. Monitoring Before you monitor your blood pressure: Do not smoke, drink caffeinated beverages, or exercise within 30 minutes before taking a  measurement. Use the bathroom and empty your bladder (urinate). Sit quietly for at least 5 minutes before taking measurements. Monitor your blood pressure at home as told by your health care provider. To do this: Sit with your back straight and supported. Place your feet flat on the floor. Do not cross your legs. Support your arm on a flat surface, such as a table. Make sure your upper arm is at heart level. Each time you measure, take two or three readings one minute apart and record the results. You may also need to have your blood pressure checked regularly by your health care provider. General information Talk with your health care provider about your diet, exercise habits, and other lifestyle factors that may be contributing to hypertension. Review all the medicines you take with your health care provider because there may be side effects or interactions. Keep all follow-up visits. Your health care provider can help you create and adjust your plan for managing your high blood pressure. Where to find more information National Heart, Lung, and Blood Institute: https://wilson-eaton.com/ American Heart Association: www.heart.org Contact a health care provider if: You think you are having a reaction to medicines you have taken. You have repeated (recurrent) headaches. You feel dizzy. You have swelling in your ankles. You have trouble with your vision. Get help right away if: You develop a severe headache or confusion. You have unusual weakness or numbness, or you feel faint. You have severe pain in your chest or abdomen. You vomit repeatedly. You have trouble breathing. These symptoms may be an emergency. Get help right away. Call 911. Do not wait to see if the symptoms will go away. Do not drive yourself to the hospital. Summary Hypertension is when the force of blood pumping through your arteries is too strong. If this condition is not controlled, it may put you at risk for serious  complications. Your personal target blood pressure may vary depending on your medical conditions, your age, and other factors. For most people, a normal blood pressure is less than 120/80. Hypertension is managed by lifestyle changes, medicines, or both. Lifestyle changes to help manage hypertension include losing weight, eating a healthy, low-sodium diet, exercising more, stopping smoking, and limiting alcohol. This information is not intended to replace advice given to you by your health care provider. Make sure you discuss any questions you have with your health care provider. Document Revised: 04/19/2021 Document Reviewed: 04/19/2021 Elsevier Patient Education  [place].  Low-Sodium Eating Plan Sodium, which is an element that makes up salt, helps you maintain a healthy balance of fluids in your body. Too much sodium can increase your blood pressure and cause fluid and waste to be held in your body. Your health care provider or dietitian may recommend following this plan if you have high blood pressure (hypertension), kidney disease, liver disease, or heart failure. Eating less sodium can help lower your blood pressure, reduce swelling, and protect your heart, liver, and kidneys. What are tips for following this plan? Reading food labels The Nutrition Facts label lists the amount of  sodium in one serving of the food. If you eat more than one serving, you must multiply the listed amount of sodium by the number of servings. Choose foods with less than 140 mg of sodium per serving. Avoid foods with 300 mg of sodium or more per serving. Shopping  Look for lower-sodium products, often labeled as "low-sodium" or "no salt added." Always check the sodium content, even if foods are labeled as "unsalted" or "no salt added." Buy fresh foods. Avoid canned foods and pre-made or frozen meals. Avoid canned, cured, or processed meats. Buy breads that have less than 80 mg of sodium per  slice. Cooking  Eat more home-cooked food and less restaurant, buffet, and fast food. Avoid adding salt when cooking. Use salt-free seasonings or herbs instead of table salt or sea salt. Check with your health care provider or pharmacist before using salt substitutes. Cook with plant-based oils, such as canola, sunflower, or olive oil. Meal planning When eating at a restaurant, ask that your food be prepared with less salt or no salt, if possible. Avoid dishes labeled as brined, pickled, cured, smoked, or made with soy sauce, miso, or teriyaki sauce. Avoid foods that contain MSG (monosodium glutamate). MSG is sometimes added to [place] food, bouillon, and some canned foods. Make meals that can be grilled, baked, poached, roasted, or steamed. These are generally made with less sodium. General information Most people on this plan should limit their sodium intake to 1,500-2,000 mg (milligrams) of sodium each day. What foods should I eat? Fruits Fresh, frozen, or canned fruit. Fruit juice. Vegetables Fresh or frozen vegetables. "No salt added" canned vegetables. "No salt added" tomato sauce and paste. Low-sodium or reduced-sodium tomato and vegetable juice. Grains Low-sodium cereals, including oats, puffed wheat and rice, and shredded wheat. Low-sodium crackers. Unsalted rice. Unsalted pasta. Low-sodium bread. Whole-grain breads and whole-grain pasta. Meats and other proteins Fresh or frozen (no salt added) meat, poultry, seafood, and fish. Low-sodium canned tuna and salmon. Unsalted nuts. Dried peas, beans, and lentils without added salt. Unsalted canned beans. Eggs. Unsalted nut butters. Dairy Milk. Soy milk. Cheese that is naturally low in sodium, such as ricotta cheese, fresh mozzarella, or Swiss cheese. Low-sodium or reduced-sodium cheese. Cream cheese. Yogurt. Seasonings and condiments Fresh and dried herbs and spices. Salt-free seasonings. Low-sodium mustard and ketchup. Sodium-free salad  dressing. Sodium-free light mayonnaise. Fresh or refrigerated horseradish. Lemon juice. Vinegar. Other foods Homemade, reduced-sodium, or low-sodium soups. Unsalted popcorn and pretzels. Low-salt or salt-free chips. The items listed above may not be a complete list of foods and beverages you can eat. Contact a dietitian for more information. What foods should I avoid? Vegetables Sauerkraut, pickled vegetables, and relishes. Olives. [place] fries. Onion rings. Regular canned vegetables (not low-sodium or reduced-sodium). Regular canned tomato sauce and paste (not low-sodium or reduced-sodium). Regular tomato and vegetable juice (not low-sodium or reduced-sodium). Frozen vegetables in sauces. Grains Instant hot cereals. Bread stuffing, pancake, and biscuit mixes. Croutons. Seasoned rice or pasta mixes. Noodle soup cups. Boxed or frozen macaroni and cheese. Regular salted crackers. Self-rising flour. Meats and other proteins Meat or fish that is salted, canned, smoked, spiced, or pickled. Precooked or cured meat, such as sausages or meat loaves. Berniece Salines. Ham. Pepperoni. Hot dogs. Corned beef. Chipped beef. Salt pork. Jerky. Pickled herring. Anchovies and sardines. Regular canned tuna. Salted nuts. Dairy Processed cheese and cheese spreads. Hard cheeses. Cheese curds. Blue cheese. Feta cheese. String cheese. Regular cottage cheese. Buttermilk. Canned milk. Fats and oils Salted butter.  Regular margarine. Ghee. Bacon fat. Seasonings and condiments Onion salt, garlic salt, seasoned salt, table salt, and sea salt. Canned and packaged gravies. Worcestershire sauce. Tartar sauce. Barbecue sauce. Teriyaki sauce. Soy sauce, including reduced-sodium. Steak sauce. Fish sauce. Oyster sauce. Cocktail sauce. Horseradish that you find on the shelf. Regular ketchup and mustard. Meat flavorings and tenderizers. Bouillon cubes. Hot sauce. Pre-made or packaged marinades. Pre-made or packaged taco seasonings. Relishes.  Regular salad dressings. Salsa. Other foods Salted popcorn and pretzels. Corn chips and puffs. Potato and tortilla chips. Canned or dried soups. Pizza. Frozen entrees and pot pies. The items listed above may not be a complete list of foods and beverages you should avoid. Contact a dietitian for more information. Summary Eating less sodium can help lower your blood pressure, reduce swelling, and protect your heart, liver, and kidneys. Most people on this plan should limit their sodium intake to 1,500-2,000 mg (milligrams) of sodium each day. Canned, boxed, and frozen foods are high in sodium. Restaurant foods, fast foods, and pizza are also very high in sodium. You also get sodium by adding salt to food. Try to cook at home, eat more fresh fruits and vegetables, and eat less fast food and canned, processed, or prepared foods. This information is not intended to replace advice given to you by your health care provider. Make sure you discuss any questions you have with your health care provider. Document Revised: 09/10/2019 Document Reviewed: 07/07/2019 Elsevier Patient Education  [place].

## 2022-09-06 ENCOUNTER — Telehealth: Payer: Self-pay

## 2022-09-06 NOTE — Patient Outreach (Signed)
  Care Coordination   Follow Up Visit Note   09/06/2022 Name: Beth Barber MRN: [id number] DOB: [birth year]  Beth Barber is a 57 y.o. year old female who sees Nevada Crane, Edwinna Areola, MD for primary care. I spoke with  Beth Barber by phone today.  What matters to the patients health and wellness today?   Contact with [employer] navigator    Goals Addressed             This Visit's Progress    Patient Stated:  Education and management of health conditions       Care Coordination Interventions: Collaboration with Royann Shivers, Director of care management to facilitate connection for patient with [facility] navigator. Patient notified to expect telephone call from [place] health care representative          SDOH assessments and interventions completed:  No     Care Coordination Interventions:  Yes, provided   Follow up plan:  as previously established    Encounter Outcome:  Pt. Visit Completed   Quinn Plowman RN,BSN,CCM [place] [phone] direct line

## 2022-09-10 DIAGNOSIS — L308 Other specified dermatitis: Secondary | ICD-10-CM | POA: Diagnosis not present

## 2022-09-10 DIAGNOSIS — L258 Unspecified contact dermatitis due to other agents: Secondary | ICD-10-CM | POA: Diagnosis not present

## 2022-10-01 ENCOUNTER — Ambulatory Visit: Payer: Self-pay | Admitting: *Deleted

## 2022-10-01 ENCOUNTER — Encounter: Payer: Self-pay | Admitting: *Deleted

## 2022-10-01 NOTE — Patient Outreach (Signed)
  Care Coordination   Initial Visit Note   10/01/2022 Name: Beth Barber MRN: [id number] DOB: 1966-01-20  Beth Barber is a 57 y.o. year old female who sees Nevada Crane, Edwinna Areola, MD for primary care. I spoke with  [name] by phone today.  What matters to the patients health and wellness today?  Getting to her dental appointment that had been rescheduled to today from 09/30/22    Goals Addressed             This Visit's Progress    Patient Stated:  Education and management of health conditions   On track    Care Coordination Interventions: Evaluation of current treatment plan related to hypertension self management and patient's adherence to plan as established by provider Discussed plans with patient for ongoing care management follow up and provided patient with direct contact information for care management team Confirmed patient is monitoring blood pressure at home Agreed to outreach at another time (either patient to call RN CM or RN CM to reach back out) as she is attempting to get ready to go to a dental appointment that was rescheduled by her dental office from 09/30/22 to 10/01/22        SDOH assessments and interventions completed:  Yes  SDOH Interventions Today    Flowsheet Row Most Recent Value  SDOH Interventions   Food Insecurity Interventions Intervention Not Indicated  Transportation Interventions Other (Comment), Contracted Vendor  [reports she has medicaid transportation but not access to same day transportation]  Stress Interventions Intervention Not Indicated        Care Coordination Interventions:  Yes, provided   Follow up plan: Follow up call scheduled for 10/02/22    Encounter Outcome:  Pt. Visit Completed   [name] L. Lavina Hamman, RN, BSN, [place] Coordinator Office number [phone]

## 2022-10-01 NOTE — Patient Instructions (Addendum)
Visit Information  Thank you for taking time to visit with me today. Please don't hesitate to contact me if I can be of assistance to you.   Following are the goals we discussed today:   Goals Addressed             This Visit's Progress    Patient Stated:  Education and management of health conditions   On track    Care Coordination Interventions: Evaluation of current treatment plan related to hypertension self management and patient's adherence to plan as established by provider Discussed plans with patient for ongoing care management follow up and provided patient with direct contact information for care management team Confirmed patient is monitoring blood pressure at home Agreed to outreach at another time (either patient to call RN CM or RN CM to reach back out) as she is attempting to get ready to go to a dental appointment that was rescheduled by her dental office from 09/30/22 to 10/01/22        Our next appointment is by telephone on 10/02/22 at 2:30 pm  Please call the care guide team at [phone] if you need to cancel or reschedule your appointment.   If you are experiencing a Mental Health or [place] or need someone to talk to, please call the Suicide and Crisis Lifeline: 988 call the [place] National Suicide Prevention Lifeline: 980-392-9819 or TTY: (581)336-9408 TTY 2032543117) to talk to a trained counselor call 1-800-273-TALK (toll free, 24 hour hotline) call the [facility]: [phone] call 911   Patient verbalizes understanding of instructions and care plan provided today and agrees to view in [place]. Active MyChart status and patient understanding of how to access instructions and care plan via MyChart confirmed with patient.     The patient has been provided with contact information for the care management team and has been advised to call with any health related questions or concerns.    Zoya Sprecher L. Lavina Hamman, RN, BSN,  [place] Coordinator Office number [phone]

## 2022-10-02 ENCOUNTER — Ambulatory Visit: Payer: Self-pay | Admitting: *Deleted

## 2022-10-02 NOTE — Patient Outreach (Addendum)
  Care Coordination   Follow Up Visit Note   10/02/2022 Name: Beth Barber MRN: [id number] DOB: 16-Jun-1966  Beth Barber is a 57 y.o. year old female who sees Nevada Crane, Edwinna Areola, MD for primary care. I spoke with  [name] by phone today.  What matters to the patients health and wellness today?  Dental coverage issues on 10/01/22  had only the x-ray only as the dental office discussed with her that she only had 50% dental coverage  Pending further services after her united healthcare coverage is verified by her dental office. She was informed she has a 50% vs 100% coverage She called [employer] healthcare to verify the 100% coverage on 10/01/22 She denies any other medical needs (hypertension) Phone connection difficulties x 3      Goals Addressed             This Visit's Progress    Patient Stated:  Education and management of health conditions   On track    Care Coordination Interventions: Evaluation of current treatment plan related to hypertension self management and patient's adherence to plan as established by provider Discussed plans with patient for ongoing care management follow up and provided patient with direct contact information for care management team Confirmed patient is monitoring blood pressure at home Encouraged outreach from patient if assistance is needed related to her dental coverage Discuss process of medical release forms for office transfer of records        SDOH assessments and interventions completed:  No    Interventions Today    Flowsheet Row Most Recent Value  Chronic Disease   Chronic disease during today's visit Other, Hypertension (HTN)  [dental services]  General Interventions   General Interventions Discussed/Reviewed General Interventions Reviewed, Durable Medical Equipment (DME), Doctor Visits  Doctor Visits Discussed/Reviewed Doctor Visits Reviewed  Durable Medical Equipment (DME) BP Cuff  Education Interventions   Education Provided  Provided Education  Provided Verbal Education On Other  [medical records]        Care Coordination Interventions:  Yes, provided   Follow up plan: Follow up call scheduled for 10/10/22    Encounter Outcome:  Pt. Visit Completed   Jahaziel Francois L. Lavina Hamman, RN, BSN, [place] Coordinator Office number [phone]

## 2022-10-02 NOTE — Patient Instructions (Signed)
Visit Information  Thank you for taking time to visit with me today. Please don't hesitate to contact me if I can be of assistance to you.   Following are the goals we discussed today:   Goals Addressed             This Visit's Progress    Patient Stated:  Education and management of health conditions   On track    Care Coordination Interventions: Evaluation of current treatment plan related to hypertension self management and patient's adherence to plan as established by provider Discussed plans with patient for ongoing care management follow up and provided patient with direct contact information for care management team Confirmed patient is monitoring blood pressure at home Encouraged outreach from patient if assistance is needed related to her dental coverage Discuss process of medical release forms for office transfer of records        Our next appointment is by telephone on 10/10/22 at 2:30 pm  Please call the care guide team at [phone] if you need to cancel or reschedule your appointment.   If you are experiencing a Mental Health or [place] or need someone to talk to, please call the Suicide and Crisis Lifeline: 988 call the [place] National Suicide Prevention Lifeline: 989-786-9478 or TTY: 970-823-7493 TTY (787)367-6758) to talk to a trained counselor call 1-800-273-TALK (toll free, 24 hour hotline) call the [facility]: 636-742-1165 call 911   Patient verbalizes understanding of instructions and care plan provided today and agrees to view in [place]. Active MyChart status and patient understanding of how to access instructions and care plan via MyChart confirmed with patient.     The patient has been provided with contact information for the care management team and has been advised to call with any health related questions or concerns.    Hollace Michelli L. Lavina Hamman, RN, BSN, [place] Coordinator Office number [phone]

## 2022-10-03 ENCOUNTER — Ambulatory Visit: Payer: Self-pay | Admitting: *Deleted

## 2022-10-03 ENCOUNTER — Encounter: Payer: Self-pay | Admitting: *Deleted

## 2022-10-03 DIAGNOSIS — L301 Dyshidrosis [pompholyx]: Secondary | ICD-10-CM

## 2022-10-03 DIAGNOSIS — J45909 Unspecified asthma, uncomplicated: Secondary | ICD-10-CM

## 2022-10-03 DIAGNOSIS — D649 Anemia, unspecified: Secondary | ICD-10-CM

## 2022-10-03 DIAGNOSIS — C8191 Hodgkin lymphoma, unspecified, lymph nodes of head, face, and neck: Secondary | ICD-10-CM

## 2022-10-03 DIAGNOSIS — I10 Essential (primary) hypertension: Secondary | ICD-10-CM

## 2022-10-03 NOTE — Patient Instructions (Addendum)
Visit Information  Thank you for taking time to visit with me today. Please don't hesitate to contact me if I can be of assistance to you.   Following are the goals we discussed today:   Goals Addressed               This Visit's Progress     Patient Stated     potential low income housing resources [facility]) (pt-stated)   Not on track     Care Coordination Interventions: Evaluation of current treatment plan related to potential housing and patient's adherence to plan as established by provider Collaborated with [facility] SW regarding potential housing Discussed plans with patient for ongoing care management follow up and provided patient with direct contact information for care management team Screening for signs and symptoms of depression related to chronic disease state  Assessed social determinant of health barriers Referral to [facility] care guides for low income housing resources      Other     Patient Stated:  Education and management of health conditions   On track     Care Coordination Interventions: Evaluation of current treatment plan related to hypertension self management and patient's adherence to plan as established by provider Discussed plans with patient for ongoing care management follow up and provided patient with direct contact information for care management team Confirmed patient is monitoring blood pressure at home Encouraged outreach from patient if assistance is needed related to her dental coverage Discuss process of medical release forms for office transfer of records        Our next appointment is by telephone on 10/10/22 at 230 pm  Please call the care guide team at [phone] if you need to cancel or reschedule your appointment.   If you are experiencing a Mental Health or [place] or need someone to talk to, please call the Suicide and Crisis Lifeline: 988 call the [place] National Suicide Prevention Lifeline: 408-874-5145 or TTY: 401-205-2461  TTY 628-265-8578) to talk to a trained counselor call 1-800-273-TALK (toll free, 24 hour hotline) call the [facility]: (910) [phone] call 911   Patient verbalizes understanding of instructions and care plan provided today and agrees to view in [place]. Active MyChart status and patient understanding of how to access instructions and care plan via MyChart confirmed with patient.     The patient has been provided with contact information for the care management team and has been advised to call with any health related questions or concerns.    Nolah Krenzer L. Lavina Hamman, RN, BSN, [place] Coordinator Office number [phone]

## 2022-10-03 NOTE — Patient Outreach (Signed)
  Care Coordination   Follow up outreach  Visit Note   10/03/2022 Name: Juliett Eastburn MRN: [id number] DOB: [birth year]  Ahlia Lemanski is a 57 y.o. year old female who sees Nevada Crane, Edwinna Areola, MD for primary care. I spoke with  [name] by phone today.  What matters to the patients health and wellness today?  Dental services to be covered by her insurance at 100% at her present dental office or another dental office, potential low income housing options/resources    Goals Addressed               This Visit's Progress     Patient Stated     potential low income housing resources [facility]) (pt-stated)   Not on track     Care Coordination Interventions: Evaluation of current treatment plan related to potential housing and patient's adherence to plan as established by provider Collaborated with [facility] SW regarding potential housing Discussed plans with patient for ongoing care management follow up and provided patient with direct contact information for care management team Screening for signs and symptoms of depression related to chronic disease state  Assessed social determinant of health barriers Referral to [facility] care guides for low income housing resources      Other     Patient Stated:  Education and management of health conditions   On track     Care Coordination Interventions: Evaluation of current treatment plan related to hypertension self management and patient's adherence to plan as established by provider Discussed plans with patient for ongoing care management follow up and provided patient with direct contact information for care management team Confirmed patient is monitoring blood pressure at home Encouraged outreach from patient if assistance is needed related to her dental coverage Discuss process of medical release forms for office transfer of records        SDOH assessments and interventions completed:  Yes  SDOH Interventions Today    Flowsheet Row Most Recent Value   SDOH Interventions   Housing Interventions [place] Referral  [referral to [facility] care guide for low income housing resource options- Patient reports concerns her asthma & eczema related to the smoke that enters her apartment from the first floor neigbors daily]  Utilities Interventions Intervention Not Indicated  Financial Strain Interventions Intervention Not Indicated  Stress Interventions --  Rebeca Allegra from neighbors below her- smoke]  Social Connections Interventions Intervention Not Indicated        Care Coordination Interventions:  Yes, provided   Follow up plan: Follow up call scheduled for 10/10/22    Encounter Outcome:  Pt. Visit Completed   Lonell Stamos L. Lavina Hamman, RN, BSN, [place] Coordinator Office number [phone]

## 2022-10-07 ENCOUNTER — Ambulatory Visit: Payer: Self-pay | Admitting: Licensed Clinical Social Worker

## 2022-10-07 DIAGNOSIS — J4540 Moderate persistent asthma, uncomplicated: Secondary | ICD-10-CM

## 2022-10-07 DIAGNOSIS — K219 Gastro-esophageal reflux disease without esophagitis: Secondary | ICD-10-CM

## 2022-10-07 NOTE — Patient Outreach (Signed)
  Care Coordination   Initial Visit Note   10/07/2022 Name: Beth Barber MRN: [id number] DOB: October 09, 1965  Beth Barber is a 57 y.o. year old female who sees Beth Barber, Beth Areola, MD for primary care. I spoke with  Beth Barber by phone today.  What matters to the patients health and wellness today? Client concerned about housing issues at her apartment complex     Goals Addressed             This Visit's Progress    patient stated she is having housing issues at her apartment.       Interventions: LCSW spoke with client  via phone today about client needs. Client spoke of apartment complex issues. She spoke of trash at apartment complex, smoke of her neighbors smoking  a lot and it is supposedly a smoke free facility. She has talked with her landlord about these issues previously  She said rent is affordable at apartment. Client and her sister live at apartment complex Spoke of transport needs of client. Client uses [facility] insurance to help with transport to and from medical appointments Discussed food supply Discussed medication procurement Provided counseling support Client has appointment with Dr. Nevada Barber in April of 2024.  Discussed client support with RN [place] mailing client housing  resources list. Encouraged client to call LCSW at [phone] for SW support.        SDOH assessments and interventions completed:  Yes  SDOH Interventions Today    Flowsheet Row Most Recent Value  SDOH Interventions   Depression Interventions/Treatment  Counseling  Physical Activity Interventions Other (Comments)  [client has steps to climb to her second floor apartment (14 steps)]  Stress Interventions Provide Counseling  [client has stress issues related to housing situation.]        Care Coordination Interventions:  Yes, provided   Interventions Today    Flowsheet Row Most Recent Value  Chronic Disease   Chronic disease during today's visit Other  [discussed housing  needs]  General Interventions   General Interventions Discussed/Reviewed [employer], General Interventions Discussed  [discussed housing needs,  discussed community resources. Discussed support with RN Maudie Mercury Gibbs]  Exercise Interventions   Exercise Discussed/Reviewed Physical Activity  [Corleen said she likes to walk for exercise]  Education Interventions   Provided Verbal Education On [place] Discussed/Reviewed Anxiety, Coping Strategies  Marlana Salvage takes walks to help her relax]        Follow up plan: Follow up call scheduled for 10/29/22 at 10:30 AM    Encounter Outcome:  Pt. Visit Completed   Norva Riffle.Sabina Beavers MSW, [place] Holiday representative [facility] Care Management [phone]

## 2022-10-07 NOTE — Addendum Note (Signed)
Addended by: Katha Cabal on: 10/07/2022 03:20 PM   Modules accepted: Orders

## 2022-10-07 NOTE — Patient Instructions (Signed)
Visit Information  Thank you for taking time to visit with me today. Please don't hesitate to contact me if I can be of assistance to you before our next scheduled telephone appointment.  Following are the goals we discussed today:    Our next appointment is by telephone on 10/29/22 at 10:30 AM   Please call the care guide team at [phone] if you need to cancel or reschedule your appointment.   If you are experiencing a Mental Health or [place] or need someone to talk to, please call the [facility]: [phone]   Following is a copy of your full plan of care:   Interventions: LCSW spoke with client  via phone today about client needs. Client spoke of apartment complex issues. She spoke of trash at apartment complex, smoke of her neighbors smoking  a lot and it is supposedly a smoke free facility. She has talked with her landlord about these issues previously  She said rent is affordable at apartment. Client and her sister live at apartment complex Spoke of transport needs of client. Client uses [facility] insurance to help with transport to and from medical appointments Discussed food supply Discussed medication procurement Provided counseling support Client has appointment with Dr. Nevada Crane in April of 2024.  Discussed client support with RN [place] mailing client housing  resources list. Encouraged client to call LCSW at [phone] for SW support.  Ms. Eriksson was given information about Care Management services by the embedded care coordination team including:  Care Management services include personalized support from designated clinical staff supervised by her physician, including individualized plan of care and coordination with other care providers 24/7 contact phone numbers for assistance for urgent and routine care needs. The patient may stop CCM services at any time (effective at the end of the month) by phone call to the  office staff.  Patient agreed to services and verbal consent obtained.    Norva Riffle.Luane Rochon MSW, [place] Holiday representative [facility] Care Management [phone]

## 2022-10-08 ENCOUNTER — Telehealth: Payer: Self-pay

## 2022-10-08 NOTE — Telephone Encounter (Signed)
   Telephone encounter was:  Successful.  10/08/2022 Name: Beth Barber MRN: [id number] DOB: 17-Jan-1966  [name] is a 57 y.o. year old female who is a primary care patient of Nevada Crane, Edwinna Areola, MD . The community resource team was consulted for assistance with  housing.  Care guide performed the following interventions: Spoke with patient about [employer] and low income housing. Verified email [email or web address] and home address to send housing information. Letter saved in Epic.  Follow Up Plan:  No further follow up planned at this time. The patient has been provided with needed resources.  [place] Resource Care Guide   [email or web address]  ?? [id number]   Website: [email or web address]  [email or web address]

## 2022-10-10 ENCOUNTER — Ambulatory Visit: Payer: Self-pay | Admitting: *Deleted

## 2022-10-10 NOTE — Patient Outreach (Signed)
  Care Coordination   Follow Up Visit Note   10/11/2022 Name: Beth Barber MRN: [id number] DOB: 23-May-1966  Beth Barber is a 57 y.o. year old female who sees Nevada Crane, Edwinna Areola, MD for primary care. I spoke with  Beth Barber by phone today.  What matters to the patients health and wellness today?  Dental services changed, To chose new in network ophthalmology services   Rent/neighbor concerns- neighbors to be offered a 30 day eviction  Confirmed [facility] care guide is active with patient for housing resources    Goals Addressed               This Visit's Progress     Patient Stated     potential low income housing resources [facility]) (pt-stated)   On track     Care Coordination Interventions: Evaluation of current treatment plan related to potential housing and patient's adherence to plan as established by provider Discussed plans with patient for ongoing care management follow up and provided patient with direct contact information for care management team Interventions Today    Flowsheet Row Most Recent Value  Chronic Disease   Chronic disease during today's visit Other  [housing, dental, ophthalmology]  General Interventions   General Interventions Discussed/Reviewed --  [conference call with patient and daughter to answer questions about housing authority and to discuss how and where to apply]  Education Interventions   Provided Verbal Education On [employer], [employer] Resources      Interventions Today    Flowsheet Row Most Recent Value  Chronic Disease   Chronic disease during today's visit Other  [housing, dental, ophthalmology]  General Interventions   General Interventions Discussed/Reviewed --  [conference call with patient and daughter to answer questions about housing authority and to discuss how and where to apply]  Education Interventions   Provided Verbal Education On [employer], [employer] Resources             SDOH assessments and interventions completed:   No  SDOH Interventions Today    Flowsheet Row Most Recent Value  SDOH Interventions   Food Insecurity Interventions Intervention Not Indicated  Housing Interventions Intervention Not Indicated  Utilities Interventions Intervention Not Indicated  Financial Strain Interventions Intervention Not Indicated  Stress Interventions Provide Counseling      Care Coordination Interventions:  Yes, provided   Follow up plan: Follow up call scheduled for 10/23/22    Encounter Outcome:  Pt. Visit Completed   Beth Barber L. Lavina Hamman, RN, BSN, [place] Coordinator Office number [phone]

## 2022-10-10 NOTE — Patient Outreach (Incomplete)
  Care Coordination   Follow Up Visit Note   10/10/2022 Name: Beth Barber MRN: [id number] DOB: 07/12/1966  Beth Barber is a 57 y.o. year old female who sees Beth Barber, Beth Areola, MD for primary care. I spoke with  Beth Barber by phone today.  What matters to the patients health and wellness today?  Dental services changed, To chose new in network ophthalmology services  Rent/neighbor concerns- neighbors to be offered a 30 day  Confirmed [facility]    Goals Addressed   None     SDOH assessments and interventions completed:  No{[facility]N Tip this will not be part of the note when signed-REQUIRED REPORT FIELD DO NOT DELETE (Optional):27901}     Care Coordination Interventions:  Yes, provided {[facility]N Tip this will not be part of the note when signed-REQUIRED REPORT FIELD DO NOT DELETE (Optional):27901}  Follow up plan: Follow up call scheduled for ***   Encounter Outcome:  Pt. Visit Completed {[facility]N Tip this will not be part of the note when signed-REQUIRED REPORT FIELD DO NOT DELETE (Optional):27901}  Beth Barber L. Lavina Hamman, RN, BSN, [place] Coordinator Office number [phone]

## 2022-10-11 NOTE — Patient Instructions (Signed)
Visit Information  Thank you for taking time to visit with me today. Please don't hesitate to contact me if I can be of assistance to you.   for [employer] and Services  tnrha ([email or web address])  [place] ([email or web address])  How To [place]  How to [employer] ([email or web address])  [place] another [employer] site [email or web address])     Following are the goals we discussed today:   Goals Addressed               This Visit's Progress     Patient Stated     potential low income housing resources [facility]) (pt-stated)   On track     Care Coordination Interventions: Evaluation of current treatment plan related to potential housing and patient's adherence to plan as established by provider Discussed plans with patient for ongoing care management follow up and provided patient with direct contact information for care management team Interventions Today    Flowsheet Row Most Recent Value  Chronic Disease   Chronic disease during today's visit Other  [housing, dental, ophthalmology]  General Interventions   General Interventions Discussed/Reviewed --  [conference call with patient and daughter to answer questions about housing authority and to discuss how and where to apply]  Education Interventions   Provided Verbal Education On [employer], [employer] Resources      Interventions Today    Flowsheet Row Most Recent Value  Chronic Disease   Chronic disease during today's visit Other  [housing, dental, ophthalmology]  General Interventions   General Interventions Discussed/Reviewed --  [conference call with patient and daughter to answer questions about housing authority and to discuss how and where to apply]  Education Interventions   Provided Verbal Education On [employer], [employer] Resources             Our next appointment is by telephone on  10/23/22 at 2:30 pm  Please call the care guide team at [phone] if you need to cancel or reschedule your appointment.   If you are experiencing a Mental Health or [place] or need someone to talk to, please call the Suicide and Crisis Lifeline: 988 call the [place] National Suicide Prevention Lifeline: 873-221-9884 or TTY: 705-707-1706 TTY 972-439-7352) to talk to a trained counselor call 1-800-273-TALK (toll free, 24 hour hotline) call the [facility]: (431) [phone] call 911   Patient verbalizes understanding of instructions and care plan provided today and agrees to view in [place]. Active MyChart status and patient understanding of how to access instructions and care plan via MyChart confirmed with patient.     The patient has been provided with contact information for the care management team and has been advised to call with any health related questions or concerns.   Peace Noyes L. Lavina Hamman, RN, BSN, [place] Coordinator Office number [phone]

## 2022-10-23 ENCOUNTER — Ambulatory Visit: Payer: Self-pay | Admitting: *Deleted

## 2022-10-23 NOTE — Patient Instructions (Signed)
Visit Information  Thank you for taking time to visit with me today. Please don't hesitate to contact me if I can be of assistance to you.   Following are the goals we discussed today:   Goals Addressed               This Visit's Progress     Patient Stated     potential low income housing resources [facility]) (pt-stated)        Care Coordination Interventions: Evaluation of current treatment plan related to potential housing and patient's adherence to plan as established by provider Discussed plans with patient for ongoing care management follow up and provided patient with direct contact information for care management team Interventions Today    Flowsheet Row Most Recent Value  Chronic Disease   Chronic disease during today's visit Other  [home safety, housing information]  General Interventions   General Interventions Discussed/Reviewed General Interventions Reviewed  [answered questions and encouragement for housing concerns related to neighbors not following the complex's smoking guidelines leading to neighbor eviction. Confirmed care guide and SW interventions provided Provided the contact info for her local housing]  Safety Interventions   Safety Discussed/Reviewed Safety Discussed, Home Safety  [She has a video doorbell that showed one of the female lower level neighbors looking and walking around her door and the video doorbell recently. Patient voices concern for safety with remaining in her apartment as she "fears what they may try to do"]  Home Safety Refer for community resources  [encouragd to file a restraint and keep in contact with her landlord.  She reports the lower level neighbors received an eviction. The female who was staying with the neighbors left but the female neighbors and her children leave around 11/08/22.]            Our next appointment is by telephone on 11/07/22 at 3:15 pm  Please call the care guide team at [phone] if you need to cancel or  reschedule your appointment.   If you are experiencing a Mental Health or [place] or need someone to talk to, please call the Suicide and Crisis Lifeline: 988   Patient verbalizes understanding of instructions and care plan provided today and agrees to view in [place]. Active MyChart status and patient understanding of how to access instructions and care plan via MyChart confirmed with patient.     The patient has been provided with contact information for the care management team and has been advised to call with any health related questions or concerns.   Linsy Ehresman L. Lavina Hamman, RN, BSN, [place] Coordinator Office number [phone]

## 2022-10-23 NOTE — Patient Outreach (Signed)
  Care Coordination   Follow Up Visit Note   10/23/2022 Name: Beth Barber MRN: [id number] DOB: Aug 03, 1966  Beth Barber is a 57 y.o. year old female who sees Beth Barber, Beth Areola, MD for primary care. I spoke with  Beth Barber by phone today.  What matters to the patients health and wellness today?  Housing, neighbors' eviction, Provided her with the contact information for the new [place] housing authority.   Goals Addressed               This Visit's Progress     Patient Stated     potential low income housing resources [facility]) (pt-stated)        Care Coordination Interventions: Evaluation of current treatment plan related to potential housing and patient's adherence to plan as established by provider Discussed plans with patient for ongoing care management follow up and provided patient with direct contact information for care management team Interventions Today    Flowsheet Row Most Recent Value  Chronic Disease   Chronic disease during today's visit Other  [home safety, housing information]  General Interventions   General Interventions Discussed/Reviewed General Interventions Reviewed  [answered questions and encouragement for housing concerns related to neighbors not following the complex's smoking guidelines leading to neighbor eviction. Confirmed care guide and SW interventions provided Provided the contact info for her local housing]  Safety Interventions   Safety Discussed/Reviewed Safety Discussed, Home Safety  [She has a video doorbell that showed one of the female lower level neighbors looking and walking around her door and the video doorbell recently. Patient voices concern for safety with remaining in her apartment as she "fears what they may try to do"]  Home Safety Refer for community resources  [encouragd to file a restraint and keep in contact with her landlord.  She reports the lower level neighbors received an eviction. The female who was staying with the neighbors left but  the female neighbors and her children leave around 11/08/22.]            SDOH assessments and interventions completed:  No     Care Coordination Interventions:  Yes, provided   Follow up plan: Follow up call scheduled for 10/23/22    Encounter Outcome:  Pt. Visit Completed   Levander Katzenstein L. Lavina Hamman, RN, BSN, [place] Coordinator Office number [phone]

## 2022-10-23 NOTE — Patient Outreach (Signed)
  Care Coordination   Follow Up Visit Note   10/23/2022 Name: Beth Barber MRN: [id number] DOB: 1966/04/13  Beth Barber is a 57 y.o. year old female who sees Beth Barber, Beth Areola, MD for primary care. I spoke with  Beth Barber by phone today.  What matters to the patients health and wellness today?  Follow up on patient attempt to reach [place]  She reports she will complete this task face to face on 10/24/22 with the assist of her daughter   Goals Addressed               This Visit's Progress     Patient Stated     potential low income housing resources [facility]) (pt-stated)        Care Coordination Interventions: Evaluation of current treatment plan related to potential housing and patient's adherence to plan as established by provider Discussed plans with patient for ongoing care management follow up and provided patient with direct contact information for care management team Interventions Today    Flowsheet Row Most Recent Value  Chronic Disease   Chronic disease during today's visit Other  [home safety, housing information]  General Interventions   General Interventions Discussed/Reviewed General Interventions Reviewed  [answered questions and encouragement for housing concerns related to neighbors not following the complex's smoking guidelines leading to neighbor eviction. Confirmed care guide and SW interventions provided Provided the contact info for her local housing]  Safety Interventions   Safety Discussed/Reviewed Safety Discussed, Home Safety  [She has a video doorbell that showed one of the female lower level neighbors looking and walking around her door and the video doorbell recently. Patient voices concern for safety with remaining in her apartment as she "fears what they may try to do"]  Home Safety Refer for community resources  [encouragd to file a restraint and keep in contact with her landlord.  She reports the lower level neighbors received an eviction.  The female who was staying with the neighbors left but the female neighbors and her children leave around 11/08/22.]            SDOH assessments and interventions completed:  No     Care Coordination Interventions:  Yes, provided   Follow up plan: Follow up call scheduled for 11/07/22    Encounter Outcome:  Pt. Visit Completed   Nissa Stannard L. Lavina Hamman, RN, BSN, [place] Coordinator Office number [phone]

## 2022-10-23 NOTE — Patient Instructions (Signed)
Visit Information  Thank you for taking time to visit with me today. Please don't hesitate to contact me if I can be of assistance to you.   [facility] [place] [facility] [place] [email or web address]  Phone:  [phone]  fax  [phone]  Following are the goals we discussed today:   Goals Addressed               This Visit's Progress     Patient Stated     potential low income housing resources [facility]) (pt-stated)        Care Coordination Interventions: Evaluation of current treatment plan related to potential housing and patient's adherence to plan as established by provider Discussed plans with patient for ongoing care management follow up and provided patient with direct contact information for care management team Interventions Today    Flowsheet Row Most Recent Value  Chronic Disease   Chronic disease during today's visit Other  [home safety, housing information]  General Interventions   General Interventions Discussed/Reviewed General Interventions Reviewed  [answered questions and encouragement for housing concerns related to neighbors not following the complex's smoking guidelines leading to neighbor eviction. Confirmed care guide and SW interventions provided Provided the contact info for her local housing]  Safety Interventions   Safety Discussed/Reviewed Safety Discussed, Home Safety  [She has a video doorbell that showed one of the female lower level neighbors looking and walking around her door and the video doorbell recently. Patient voices concern for safety with remaining in her apartment as she "fears what they may try to do"]  Home Safety Refer for community resources  [encouragd to file a restraint and keep in contact with her landlord.  She reports the lower level neighbors received an eviction. The female who was staying with the neighbors left but the female neighbors and her children leave around 11/08/22.]             Our next appointment is by telephone on 10/23/22 at 2:30 pm  Please call the care guide team at [phone] if you need to cancel or reschedule your appointment.   If you are experiencing a Mental Health or [place] or need someone to talk to, please call the Suicide and Crisis Lifeline: 988 call the [place] National Suicide Prevention Lifeline: (838)084-0903 or TTY: 214-065-5161 TTY 6710338361) to talk to a trained counselor call 1-800-273-TALK (toll free, 24 hour hotline) call the [facility]: (865)524-6102 call 911   Patient verbalizes understanding of instructions and care plan provided today and agrees to view in [place]. Active MyChart status and patient understanding of how to access instructions and care plan via MyChart confirmed with patient.     The patient has been provided with contact information for the care management team and has been advised to call with any health related questions or concerns.   Les Longmore L. Lavina Hamman, RN, BSN, [place] Coordinator Office number [phone]

## 2022-10-29 ENCOUNTER — Telehealth: Payer: Self-pay

## 2022-10-29 ENCOUNTER — Ambulatory Visit: Payer: Self-pay | Admitting: Licensed Clinical Social Worker

## 2022-10-29 NOTE — Patient Instructions (Signed)
Visit Information  Thank you for taking time to visit with me today. Please don't hesitate to contact me if I can be of assistance to you before our next scheduled telephone appointment.  Following are the goals we discussed today:    Our next appointment is by telephone on 11/18/22 at 10:30 AM    Please call the care guide team at [phone] if you need to cancel or reschedule your appointment.   If you are experiencing a Mental Health or [place] or need someone to talk to, please call the [facility]: [phone]   Following is a copy of your full plan of care:   Interventions: LCSW spoke with client  via phone today about client needs. Client spoke of apartment complex issues. She spoke of difficulty with neighbors at her apartment complex. Client said she has spoken with landlord about thiseseese issues with her neighbors. Client said rent is affordable at apartment. Client and her sister live at apartment complex Spoke of transport needs of client. Client uses [facility] insurance to help with transport to and from medical appointments Discussed food supply Discussed medication procurement Provided counseling support to client. Talked with Lelan Pons about stress management and coping with anxiety issues. She said she often likes to walk to exercise but has not been able to walk recently Client has appointment with Dr. [place] Crane in April of 2024.  Discussed client support with RN [place] mailing client housing  resources list. Client said she received housing resources list. She said she has completed application for housing assistance with [employer] and application and been sent in on line and has been accepted , per patient. She said she is having trouble sending verification materials for herself to [employer] . Discussed pain issues of client. Discussed housing options in area. Client said her  daughter lives about 15 minutes away from client location and thus client would not want to move very far away from her daughter.  Ramona Weiner LCSW name and phone number [phone]) and encouraged Beth Barber to call LCSW as needed for SW support Encouraged client to call LCSW at [phone] for SW support.  [name] was given information about Care Management services by the embedded care coordination team including:  Care Management services include personalized support from designated clinical staff supervised by her physician, including individualized plan of care and coordination with other care providers 24/7 contact phone numbers for assistance for urgent and routine care needs. The patient may stop CCM services at any time (effective at the end of the month) by phone call to the office staff.  Patient agreed to services and verbal consent obtained.   Norva Riffle.Glendi Mohiuddin MSW, [place] representative [facility] Care Management [phone]

## 2022-10-29 NOTE — Telephone Encounter (Signed)
   Telephone encounter was:  Successful.  10/29/2022 Name: Beth Barber MRN: [id number] DOB: [birth year]  [name] is a 57 y.o. year old female who is a primary care patient of [place] Crane, Edwinna Areola, MD . The community resource team was consulted for assistance with  housing.  Care guide performed the following interventions: Spoke with patient she was aware she accidentally sent her information to me and she received my response but did not know where to send it.  I called the [employer] to verifiy the email address and forwarded the information to [email or web address].   Gave [name] the number to call to verify that the email has been received.  Also gave her the email address just in case they don't accept the information forwarded from another party.  [name] stated she would call to verify receipt and if it is not accepted she will have her daughter email it.   Explained to [name] that her application has been accepted and she will be placed on a wait list. When her name comes to the top of the list her application will be approved.  Follow Up Plan:  No further follow up planned at this time. The patient has been provided with needed resources.  [place] Resource Care Guide   [email or web address]  ?? [phone]   Website: [email or web address]  [email or web address]

## 2022-10-29 NOTE — Patient Outreach (Signed)
Care Coordination   Follow Up Visit Note   10/29/2022 Name: Beth Barber MRN: [id number] DOB: 1965/12/27  Beth Barber is a 57 y.o. year old female who sees Nevada Crane, Edwinna Areola, MD for primary care. I spoke with  Beth Barber by phone today.  What matters to the patients health and wellness today?  Patient stated she is having housing issues  at her apartment.     Goals Addressed             This Visit's Progress    patient stated she is having housing issues at her apartment.       Interventions: LCSW spoke with client  via phone today about client needs. Client spoke of apartment complex issues. She spoke of difficulty with neighbors at her apartment complex. Client said she has spoken with landlord about thiseseese issues with her neighbors. Client said rent is affordable at apartment. Client and her sister live at apartment complex Spoke of transport needs of client. Client uses [facility] insurance to help with transport to and from medical appointments Discussed food supply Discussed medication procurement Provided counseling support to client. Talked with Beth Barber about stress management and coping with anxiety issues. She said she often likes to walk to exercise but has not been able to walk recently Client has appointment with Dr. [place] Crane in April of 2024.  Discussed client support with RN [place] mailing client housing  resources list. Client said she received housing resources list. She said she has completed application for housing assistance with [employer] and application and been sent in on line and has been accepted , per patient. She said she is having trouble sending verification materials for herself to [employer] . Discussed pain issues of client. Discussed housing options in area. Client said her daughter lives about 15 minutes away from client location and thus client would not want to move very far away from her  daughter.  Beth Aydt LCSW name and phone number [phone]) and encouraged Beth Barber to call LCSW as needed for SW support Encouraged client to call LCSW at [phone] for SW support.        SDOH assessments and interventions completed:  Yes  SDOH Interventions Today    Flowsheet Row Most Recent Value  SDOH Interventions   Physical Activity Interventions Other (Comments)  [has not been able to walk recently and she would like]  Stress Interventions Provide Counseling  [has stress about housing situation and dealing with issues with her neighbors]        Care Coordination Interventions:  Yes, provided   Interventions Today    Flowsheet Row Most Recent Value  Chronic Disease   Chronic disease during today's visit Other  [discussed with client her current housing situation]  General Interventions   General Interventions Discussed/Reviewed General Interventions Discussed, [employer]  [discussed program support]  Exercise Interventions   Exercise Discussed/Reviewed Physical Activity  Physical Activity Discussed/Reviewed Types of exercise  Education Interventions   Education Provided Provided Education  Provided Verbal Education On [employer]  [discussed client sending in application for housing with [place] Discussed/Reviewed Anxiety, Coping Strategies  [discussed client management of stress issues]  Safety Interventions   Safety Discussed/Reviewed Safety Discussed        Follow up plan: Follow up call scheduled for 11/18/22 at 10:30 AM    Encounter Outcome:  Pt. Visit Completed   Norva Riffle.Ura Yingling  MSW, [place] Clinical Social Worker [facility] Care Management [phone]

## 2022-11-07 ENCOUNTER — Encounter: Payer: Self-pay | Admitting: *Deleted

## 2022-11-07 ENCOUNTER — Ambulatory Visit: Payer: Self-pay | Admitting: *Deleted

## 2022-11-07 NOTE — Patient Instructions (Addendum)
Visit Information  Thank you for taking time to visit with me today. Please don't hesitate to contact me if I can be of assistance to you.   Following are the goals we discussed today:   Goals Addressed               This Visit's Progress     Patient Stated     potential low income housing resources [facility]) (pt-stated)        Care Coordination Interventions: Evaluation of current treatment plan related to potential housing and patient's adherence to plan as established by provider Discussed plans with patient for ongoing care management follow up and provided patient with direct contact information for care management team Interventions Today    Flowsheet Row Most Recent Value  Chronic Disease   Chronic disease during today's visit Other  [asthma, housing, dental provider]  General Interventions   General Interventions Discussed/Reviewed General Interventions Discussed, Emergency planning/management officer, Communication with  Communication with Social Work  [employer]  message to [place] to update him of patient interest to relocate to [place], [place] (55+ community) Inquired about any resource to assist with move finances]  [place] Discussed/Reviewed Coping Strategies, Mental Health Reviewed  Rebeca Allegra related to funds to work on new housing]  Safety Interventions   Safety Discussed/Reviewed Safety Reviewed, Home Safety  [remains safe in her present apartment complex]             Our next appointment is by telephone on 11/22/22 at 2:15 pm  Please call the care guide team at [phone] if you need to cancel or reschedule your appointment.   If you are experiencing a Mental Health or [place] or need someone to talk to, please call the Suicide and Crisis Lifeline: 988 call the [place] National Suicide Prevention Lifeline: 801-281-7484 or TTY: 952-296-6149 TTY 682-835-5995) to talk to a trained counselor call 1-800-273-TALK (toll  free, 24 hour hotline) call the [facility]: 701-508-1299 call 911   Patient verbalizes understanding of instructions and care plan provided today and agrees to view in [place]. Active MyChart status and patient understanding of how to access instructions and care plan via MyChart confirmed with patient.     The patient has been provided with contact information for the care management team and has been advised to call with any health related questions or concerns.   Blakelyn Dinges L. Lavina Hamman, RN, BSN, [place] Coordinator Office number [phone]

## 2022-11-07 NOTE — Patient Outreach (Signed)
  Care Coordination   Follow Up Visit Note   11/07/2022 Name: Beth Barber MRN: [id number] DOB: [birth year]  Beth Barber is a 57 y.o. year old female who sees Beth Barber, Beth Areola, MD for primary care. I spoke with  Beth Barber by phone today.  What matters to the patients health and wellness today?  Housing (present lease ends 11/17/22 & her neighbors leave on 11/08/22) Looking at a senior living area in [place] [address]) remaining safe a present  apartment. Her lower level neighbor is not causing concerns Her daughter is planning to move also to [place]   Will need help with her deposit & first month for new apartment + new complex application fee of 0000000 for her and $25 for her sister She has used up most of her funds for this month Beth Barber does not offer a voucher to assist with moving Encouraged her to consult her [facility] DSS staff for voucher Has to give present apartment complex a 60 day notice   A little stress because of this  With noted eczema flare up on wrists Stress relief encouraged  Called her Beth Barber healthcare navigator to assist with new MDs in [place], [place] gave her the list of dental providers near [id number]     Goals Addressed               This Visit's Progress     Patient Stated     potential low income housing resources [facility]) (pt-stated)        Care Coordination Interventions: Evaluation of current treatment plan related to potential housing and patient's adherence to plan as established by provider Discussed plans with patient for ongoing care management follow up and provided patient with direct contact information for care management team Interventions Today    Flowsheet Row Most Recent Value  Chronic Disease   Chronic disease during today's visit Other  [asthma, housing, dental provider]  General Interventions   General Interventions Discussed/Reviewed General Interventions Reviewed, [place] Discussed/Reviewed Coping Strategies, Mental Health Reviewed  Beth Barber related to funds to work on new housing]  Safety Interventions   Safety Discussed/Reviewed Safety Reviewed, Home Safety  [remains safe in her present apartment complex]             SDOH assessments and interventions completed:  No     Care Coordination Interventions:  Yes, provided   Follow up plan: Follow up call scheduled for 11/22/22    Encounter Outcome:  Pt. Visit Completed   Beth Barber L. Lavina Hamman, RN, BSN, [place] Coordinator Office number [phone]

## 2022-11-12 ENCOUNTER — Ambulatory Visit: Payer: Self-pay | Admitting: Licensed Clinical Social Worker

## 2022-11-12 NOTE — Patient Outreach (Signed)
  Care Coordination   Follow Up Visit Note   11/12/2022 Name: Beth Barber MRN: [id number] DOB: 1966/05/02  Beth Barber is a 57 y.o. year old female who sees Nevada Crane, Edwinna Areola, MD for primary care. I spoke with  [name] by phone today.  What matters to the patients health and wellness today? Patient is having housing issues at her apartment    Goals Addressed             This Visit's Progress    patient stated she is having housing issues at her apartment.       Interventions:  LCSW spoke via phone today with [name]. [place] and LCSW spoke of her housing concerns . She is interested in finding another apartment complex where she and her sister can live. CMA sent client housing resources list a few weeks ago. Client has been using list to look at apartment complexes in area. LCSW discussed Cyndee Brightly with client and encouraged client to contact office at that complex to inquire about apartment openings.  LCSW also talked with client about 2 other apartment complexes in the area Discussed needs of her sister who resides with client. Client said she and her sister split expenses. Discussed food supply. Client has adequate food supply . She has U Card to use for food resources Discussed medication procurement.  Client has prescribed medications Provided counseling support to client. Discussed coping skills to help client manage anxiety Discussed RN Joellyn Quails as a Brewing technologist for client. Discussed that client has completed application at [employer] and is on waiting list for that agency Encouraged client to call LCSW for SW support at [phone].      SDOH assessments and interventions completed:  Yes  SDOH Interventions Today    Flowsheet Row Most Recent Value  SDOH Interventions   Depression Interventions/Treatment  Counseling  Physical Activity Interventions Other (Comments)  [walking challenges occasionlly]  Stress Interventions Provide  Counseling  [stress related to housing needs]       Care Coordination Interventions:  Yes, provided   Interventions Today    Flowsheet Row Most Recent Value  Chronic Disease   Chronic disease during today's visit Other  [spoke with Lelan Pons about her housing needs]  General Interventions   General Interventions Discussed/Reviewed General Interventions Discussed, [employer] Resources  [Discussed [employer] apartment complex with client. Discussed 2 other apartment complexes in [place], [place] with client]  Exercise Interventions   Exercise Discussed/Reviewed Physical Activity  [client likes to walk occasionally]  Education Interventions   Education Provided Provided Education  Provided Verbal Education On [employer]  [discussed housing resources and financial needs of client]  Mental Health Interventions   Mental Health Discussed/Reviewed Anxiety, Coping Strategies  [client likes to relax by taking care of her pet dog]  Nutrition Interventions   Nutrition Discussed/Reviewed Nutrition Discussed  [client receives U Card]  Pharmacy Interventions   Pharmacy Dicussed/Reviewed Pharmacy Topics Discussed        Follow up plan: Follow up call scheduled for 12/30/22 at 1:00 PM     Encounter Outcome:  Pt. Visit Completed   Norva Riffle.Gennell How MSW, [place] Holiday representative [facility] Care Management [phone]

## 2022-11-12 NOTE — Patient Instructions (Signed)
Visit Information  Thank you for taking time to visit with me today. Please don't hesitate to contact me if I can be of assistance to you.   Following are the goals we discussed today:   Goals Addressed             This Visit's Progress    patient stated she is having housing issues at her apartment.       Interventions:  LCSW spoke via phone today with [name]. [place] and LCSW spoke of her housing concerns . She is interested in finding another apartment complex where she and her sister can live. CMA sent client housing resources list a few weeks ago. Client has been using list to look at apartment complexes in area. LCSW discussed Cyndee Brightly with client and encouraged client to contact office at that complex to inquire about apartment openings.  LCSW also talked with client about 2 other apartment complexes in the area Discussed needs of her sister who resides with client. Client said she and her sister split expenses. Discussed food supply. Client has adequate food supply . She has U Card to use for food resources Discussed medication procurement.  Client has prescribed medications Provided counseling support to client. Discussed coping skills to help client manage anxiety Discussed RN Joellyn Quails as a Brewing technologist for client. Discussed that client has completed application at [employer] and is on waiting list for that agency Encouraged client to call LCSW for SW support at [phone].      Our next appointment is by telephone on 12/30/22 at 1:00 PM   Please call the care guide team at [phone] if you need to cancel or reschedule your appointment.   If you are experiencing a Mental Health or [place] or need someone to talk to, please call the [facility]: [phone]   The patient verbalized understanding of instructions, educational materials, and care plan provided today and DECLINED offer to receive copy  of patient instructions, educational materials, and care plan.   The patient has been provided with contact information for the care management team and has been advised to call with any health related questions or concerns.   Norva Riffle.Eddye Broxterman MSW, [place] Holiday representative [facility] Care Management [phone]

## 2022-11-18 ENCOUNTER — Encounter: Payer: 59 | Admitting: Licensed Clinical Social Worker

## 2022-11-22 ENCOUNTER — Ambulatory Visit: Payer: Self-pay | Admitting: *Deleted

## 2022-11-22 NOTE — Patient Instructions (Addendum)
Visit Information  Thank you for taking time to visit with me today. Please don't hesitate to contact me if I can be of assistance to you.   Following are the goals we discussed today:   Goals Addressed   None     Our next appointment is by telephone on 12/27/22 at 2 pm  Please call the care guide team at [phone] if you need to cancel or reschedule your appointment.   If you are experiencing a Mental Health or Behavioral Health Crisis or need someone to talk to, please call the Suicide and Crisis Lifeline: 988 call the [place] National Suicide Prevention Lifeline: 571-008-4368 or TTY: 279-329-8001 TTY 408-151-3261) to talk to a trained counselor call 1-800-273-TALK (toll free, 24 hour hotline) call the [facility]: [phone] call 911   Patient verbalizes understanding of instructions and care plan provided today and agrees to view in MyChart. Active MyChart status and patient understanding of how to access instructions and care plan via MyChart confirmed with patient.     The patient has been provided with contact information for the care management team and has been advised to call with any health related questions or concerns.   Dariona Postma L. Noelle Penner, RN, BSN, CCM [facility] Care Management Community Coordinator Office number [phone]

## 2022-11-22 NOTE — Patient Outreach (Signed)
  Care Coordination   Follow Up Visit Note   11/22/2022 Name: Beth Barber MRN: [id number] DOB: 1966/07/08  Beth Barber is a 57 y.o. year old female who sees Beth Barber, Beth Hazel, MD for primary care. I spoke with  Beth Barber by phone today.  What matters to the patients health and wellness today?  Housing issues, Active with [facility] SW  Neighbors have not moved but has apologized to the patient The female who visited the neighbors has returned on 11/20/22 The housing apartment complex she was interested in Beth Barber has updated her that there are no longer any vacancy  Her brother has prostate cancer (mets to lymph nodes) There is a family history of cancer  She has experienced a lot of family loss while living in [place]- Loss of her older sister, older brother  Hypertension Has not taken her blood pressure medicine but her monitoring of her blood pressure has been <140/90 Voiced interest in changing hypertension medicine to remove the diuretic Confirms some cramping and sharp Follow up with Beth Barber on 12/04/22  Asthma Has noted some   Goals Addressed   None     SDOH assessments and interventions completed:  Yes     Care Coordination Interventions:  No, not indicated   Follow up plan: Follow up call scheduled for 12/27/22    Encounter Outcome:  Pt. Visit Completed   Beth Cuthrell L. Noelle Penner, RN, BSN, CCM [facility] Care Management Community Coordinator Office number [phone]

## 2022-11-27 ENCOUNTER — Ambulatory Visit: Payer: Self-pay | Admitting: Licensed Clinical Social Worker

## 2022-11-27 DIAGNOSIS — E785 Hyperlipidemia, unspecified: Secondary | ICD-10-CM | POA: Diagnosis not present

## 2022-11-27 DIAGNOSIS — R7303 Prediabetes: Secondary | ICD-10-CM | POA: Diagnosis not present

## 2022-11-27 DIAGNOSIS — E559 Vitamin D deficiency, unspecified: Secondary | ICD-10-CM | POA: Diagnosis not present

## 2022-11-27 DIAGNOSIS — I10 Essential (primary) hypertension: Secondary | ICD-10-CM | POA: Diagnosis not present

## 2022-11-27 NOTE — Patient Instructions (Signed)
Visit Information  Thank you for taking time to visit with me today. Please don't hesitate to contact me if I can be of assistance to you.   Following are the goals we discussed today:   Goals Addressed               This Visit's Progress     Patient Stated housing issues at her apartment have improved. She said she is still looking for alternate housing location but is taking her time with her search for new apartment (pt-stated)        Interventions:  LCSW spoke via phone with client today about client needs. Client spoke of transport resources. She said she is using local [employer] bus to help with transport in local area Discussed housing needs. She said she had applied to [facility] apartments in [place], [place] and is on waiting list for [facility] apartments. Discussed needs of her sister who resides with client Discussed use of U card. She said she uses U card to help with utility costs; she also uses U card to help procure needed food items Discussed pain issues of client Discussed medication procurement Discussed vision of client. She said she wears glasses to help with vision.  Discussed program support for client with RN [name]. [name] RN has been in communication with client Provided counseling support Encouraged client to call LCSW for SW support as needed at [phone]     Our next appointment is by telephone on 12/31/22 at 9:30 AM   Please call the care guide team at [phone] if you need to cancel or reschedule your appointment.   If you are experiencing a Mental Health or Behavioral Health Crisis or need someone to talk to, please call the [facility]: [phone]   The patient verbalized understanding of instructions, educational materials, and care plan provided today and DECLINED offer to receive copy of patient instructions, educational materials, and care plan.   The patient has been provided with contact information for the care management team  and has been advised to call with any health related questions or concerns.   [name].Seldon Barrell MSW, LCSW Licensed Visual merchandiser [facility] Care Management [phone]

## 2022-11-27 NOTE — Patient Outreach (Signed)
  Care Coordination   Follow Up Visit Note   11/27/2022 Name: Beth Barber MRN: [id number] DOB: 05-Aug-1966  Beth Barber is a 57 y.o. year old female who sees Beth Barber, Beth Hazel, MD for primary care. I spoke with  Beth Barber by phone today.  What matters to the patients health and wellness today?   Patient Stated housing issues at her apartment have improved. She said she is still looking for alternate housing location but is taking her time with her search for new apartment     Goals Addressed               This Visit's Progress     Patient Stated housing issues at her apartment have improved. She said she is still looking for alternate housing location but is taking her time with her search for new apartment (pt-stated)        Interventions:  LCSW spoke via phone with client today about client needs. Client spoke of transport resources. She said she is using local [employer] bus to help with transport in local area Discussed housing needs. She said she had applied to [facility] apartments in [place], [place] and is on waiting list for [facility] apartments. Discussed needs of her sister who resides with client Discussed use of U card. She said she uses U card to help with utility costs; she also uses U card to help procure needed food items Discussed pain issues of client Discussed medication procurement Discussed vision of client. She said she wears glasses to help with vision.  Discussed program support for client with RN Beth Barber. Beth Regal RN has been in communication with client Provided counseling support Encouraged client to call LCSW for SW support as needed at [phone]     SDOH assessments and interventions completed:  Yes  SDOH Interventions Today    Flowsheet Row Most Recent Value  SDOH Interventions   Transportation Interventions SCAT (Specialized Community Area [employer]poration), Other (Comment)  [she uses SCAT bus as needed in [place], [place]]  Physical Activity Interventions Other  (Comments)  [client said she has difficulty standing for periods of time]  Stress Interventions Provide Counseling  [has stress related to managing medical needs]        Care Coordination Interventions:  Yes, provided    Interventions Today    Flowsheet Row Most Recent Value  Chronic Disease   Chronic disease during today's visit Other  [LCSW spoke with Beth Barber about her current needs]  General Interventions   General Interventions Discussed/Reviewed General Interventions Discussed, [employer] transport help with SCAT bus. discussed housing needs. she said she has applied to [facility] apartments in [place], [place] and is on waiting list for [facility] apartments]  Exercise Interventions   Exercise Discussed/Reviewed Physical Activity  Education Interventions   Education Provided Provided Education  Provided Verbal Education On Community Resources  [discussed apartment complexes in the area]  Mental Health Interventions   Mental Health Discussed/Reviewed Anxiety, Coping Strategies  [client likes to relax by walking her dog outdoors]  Nutrition Interventions   Nutrition Discussed/Reviewed Nutrition Discussed  [client uses U card to help with food costs]       Follow up plan: Follow up call scheduled for 12/31/22 at 9:30 AM    Encounter Outcome:  Pt. Visit Completed   Beth Barber MSW, LCSW Licensed Visual merchandiser [facility] Care Management [phone]

## 2022-12-04 DIAGNOSIS — Z8571 Personal history of Hodgkin lymphoma: Secondary | ICD-10-CM | POA: Diagnosis not present

## 2022-12-04 DIAGNOSIS — E559 Vitamin D deficiency, unspecified: Secondary | ICD-10-CM | POA: Diagnosis not present

## 2022-12-04 DIAGNOSIS — E876 Hypokalemia: Secondary | ICD-10-CM | POA: Diagnosis not present

## 2022-12-04 DIAGNOSIS — I10 Essential (primary) hypertension: Secondary | ICD-10-CM | POA: Diagnosis not present

## 2022-12-04 DIAGNOSIS — L309 Dermatitis, unspecified: Secondary | ICD-10-CM | POA: Diagnosis not present

## 2022-12-04 DIAGNOSIS — M8580 Other specified disorders of bone density and structure, unspecified site: Secondary | ICD-10-CM | POA: Diagnosis not present

## 2022-12-04 DIAGNOSIS — Z0001 Encounter for general adult medical examination with abnormal findings: Secondary | ICD-10-CM | POA: Diagnosis not present

## 2022-12-04 DIAGNOSIS — M545 Low back pain, unspecified: Secondary | ICD-10-CM | POA: Diagnosis not present

## 2022-12-04 DIAGNOSIS — E785 Hyperlipidemia, unspecified: Secondary | ICD-10-CM | POA: Diagnosis not present

## 2022-12-04 DIAGNOSIS — N644 Mastodynia: Secondary | ICD-10-CM | POA: Diagnosis not present

## 2022-12-04 DIAGNOSIS — R7303 Prediabetes: Secondary | ICD-10-CM | POA: Diagnosis not present

## 2022-12-05 ENCOUNTER — Other Ambulatory Visit (HOSPITAL_COMMUNITY): Payer: Self-pay | Admitting: Family Medicine

## 2022-12-05 ENCOUNTER — Encounter (HOSPITAL_COMMUNITY): Payer: Self-pay | Admitting: Family Medicine

## 2022-12-05 DIAGNOSIS — N644 Mastodynia: Secondary | ICD-10-CM

## 2022-12-10 ENCOUNTER — Ambulatory Visit: Payer: Self-pay | Admitting: Licensed Clinical Social Worker

## 2022-12-10 NOTE — Patient Instructions (Signed)
Visit Information  Thank you for taking time to visit with me today. Please don't hesitate to contact me if I can be of assistance to you.   Following are the goals we discussed today:   Goals Addressed               This Visit's Progress     Patient Stated housing issues at her apartment have improved. She said she is still looking for alternate housing location but is taking her time with her search for new apartment (pt-stated)        Interventions:  LCSW spoke with client about transport services. She uses local [employer] bus for some transport help Discussed medication procurement. Discussed sleeping challenges. Discussed food supply Reviewed pain issues. She spoke of knee pain issues Reviewed support of PCP. She sees Dr. Fidel Levy as PCP. Client is concerned about home issues of a friend who lives nearby. She spoke of her concerns for her friend Provided counseling support for Beth Barber Client said she is working with PCP to monitor her blood pressure levels. Encouraged Beth Barber to call LCSW for SW support as needed at [phone].         LCSW to call client as scheduled to assess client needs at that time  Please call the care guide team at [phone] if you need to cancel or reschedule your appointment.   If you are experiencing a Mental Health or Behavioral Health Crisis or need someone to talk to, please go to [facility] [address], [place] [phone])   The patient verbalized understanding of instructions, educational materials, and care plan provided today and DECLINED offer to receive copy of patient instructions, educational materials, and care plan.   The patient has been provided with contact information for the care management team and has been advised to call with any health related questions or concerns.   [name].Danyel Griess MSW, LCSW Licensed Visual merchandiser [facility] Care Management [phone]

## 2022-12-10 NOTE — Patient Outreach (Signed)
  Care Coordination   Follow Up Visit Note   12/10/2022 Name: Beth Barber MRN: [id number] DOB: May 22, 1966  Beth Barber is a 57 y.o. year old female who sees [name], [name], MD for primary care. I spoke with  Beth Barber by phone today.  What matters to the patients health and wellness today?  Patient Stated housing issues at her apartment have improved. She said she is still looking for alternate housing location but is taking her time with her search for new apartment (pt-stated)   Goals Addressed               This Visit's Progress     Patient Stated housing issues at her apartment have improved. She said she is still looking for alternate housing location but is taking her time with her search for new apartment (pt-stated)        Interventions:  LCSW spoke with client about transport services. She uses local [employer] bus for some transport help Discussed medication procurement. Discussed sleeping challenges. Discussed food supply Reviewed pain issues. She spoke of knee pain issues Reviewed support of PCP. She sees Dr. Fidel Levy as PCP. Client is concerned about home issues of a friend who lives nearby. She spoke of her concerns for her friend Provided counseling support for Beth Barber Client said she is working with PCP to monitor her blood pressure levels. Encouraged [name] to call LCSW for SW support as needed at [phone].          SDOH assessments and interventions completed:  Yes  SDOH Interventions Today    Flowsheet Row Most Recent Value  SDOH Interventions   Depression Interventions/Treatment  Counseling  Physical Activity Interventions Other (Comments)  [challenges in walking.knee pain issues]  Stress Interventions Provide Counseling  [stress related to managing medical needs]        Care Coordination Interventions:  Yes, provided   Interventions Today    Flowsheet Row Most Recent Value  Chronic Disease   Chronic disease during today's visit Other   [spoke with client about current needs. She is concerned about needs of one of her friends]  General Interventions   General Interventions Discussed/Reviewed General Interventions Discussed, [employer]  [discussed community resources. discussed SCAT transport services.]  Exercise Interventions   Exercise Discussed/Reviewed Physical Activity  [client has knee pain issues]  Physical Activity Discussed/Reviewed Physical Activity Reviewed  Education Interventions   Education Provided Provided Education  Provided Verbal Education On Community Resources  Mental Health Interventions   Mental Health Discussed/Reviewed Anxiety, Coping Strategies  [reviewed stress management. discussed caring for herself and caring for needs of her sister]  Nutrition Interventions   Nutrition Discussed/Reviewed Nutrition Discussed  Safety Interventions   Safety Discussed/Reviewed Home Safety        Follow up plan: LCSW to call client as scheduled to assess client needs at that time   Encounter Outcome:  Pt. Visit Completed   [name].Larissa Pegg MSW, LCSW Licensed Visual merchandiser [facility] Care Management [phone]

## 2022-12-18 DIAGNOSIS — I10 Essential (primary) hypertension: Secondary | ICD-10-CM | POA: Diagnosis not present

## 2022-12-25 ENCOUNTER — Encounter (HOSPITAL_COMMUNITY): Payer: Self-pay

## 2022-12-25 ENCOUNTER — Ambulatory Visit (HOSPITAL_COMMUNITY)
Admission: RE | Admit: 2022-12-25 | Discharge: 2022-12-25 | Disposition: A | Discharge status: Home / Self Care | Payer: 59 | Source: Ambulatory Visit | Attending: Family Medicine | Admitting: Family Medicine

## 2022-12-25 DIAGNOSIS — N644 Mastodynia: Secondary | ICD-10-CM | POA: Insufficient documentation

## 2022-12-27 ENCOUNTER — Ambulatory Visit: Payer: Self-pay | Admitting: *Deleted

## 2022-12-27 NOTE — Patient Outreach (Signed)
  Care Coordination   Follow Up Visit Note   12/27/2022 Name: Erabella Seldon MRN: [id number] DOB: [birth year]  Beth Barber is a 57 y.o. year old female who sees [name], [name], MD for primary care. I spoke with  [name] by phone today.  What matters to the patients health and wellness today?  Confirms she has a new female navigator Scientific laboratory technician) for [place] [facility] He is to help her with dental appointment and looking at housing related to having issues walking up her stairs to her apartment Hypertension better with new change in hypertension medication to amlodipine      Goals Addressed               This Visit's Progress     Patient Stated     COMPLETED: potential low income housing resources [facility]) (pt-stated)   On track     Care Coordination Interventions: Evaluation of current treatment plan related to potential housing and patient's adherence to plan as established by provider Discussed plans with patient for ongoing care management follow up and provided patient with direct contact information for care management team Confirm she has resources from [facility] SW and is also receiving assist from her united healthcare navigator Closing goal       Other     Patient Stated:  Education and management of health conditions   On track     Care Coordination Interventions: Interventions Today    Flowsheet Row Most Recent Value  Chronic Disease   Chronic disease during today's visit Hypertension (HTN), Other  [access to new [facility] navigator, housing, dental]  General Interventions   General Interventions Discussed/Reviewed General Interventions Reviewed, Programmer, applications, Doctor Visits  Doctor Visits Discussed/Reviewed Doctor Visits Reviewed, PCP  PCP/Specialist Visits Compliance with follow-up visit  Mental Health Interventions   Mental Health Discussed/Reviewed Mental Health Reviewed, Coping Strategies  Pharmacy Interventions   Pharmacy Dicussed/Reviewed Pharmacy Topics Discussed, Affording  Medications  [new amlodipine]             SDOH assessments and interventions completed:  No     Care Coordination Interventions:  Yes, provided   Follow up plan: Follow up call scheduled for 01/28/23    Encounter Outcome:  Pt. Visit Completed   Aleese Kamps L. Noelle Penner, RN, BSN, CCM [facility] Care Management Community Coordinator Office number [phone]

## 2022-12-27 NOTE — Patient Instructions (Signed)
Visit Information  Thank you for taking time to visit with me today. Please don't hesitate to contact me if I can be of assistance to you.   Following are the goals we discussed today:   Goals Addressed               This Visit's Progress     Patient Stated     COMPLETED: potential low income housing resources [facility]) (pt-stated)   On track     Care Coordination Interventions: Evaluation of current treatment plan related to potential housing and patient's adherence to plan as established by provider Discussed plans with patient for ongoing care management follow up and provided patient with direct contact information for care management team Confirm she has resources from [facility] SW and is also receiving assist from her united healthcare navigator Closing goal       Other     Patient Stated:  Education and management of health conditions   On track     Care Coordination Interventions: Interventions Today    Flowsheet Row Most Recent Value  Chronic Disease   Chronic disease during today's visit Hypertension (HTN), Other  [access to new [facility], [place] navigator, housing, dental]  General Interventions   General Interventions Discussed/Reviewed General Interventions Reviewed, [employer], Doctor Visits  Doctor Visits Discussed/Reviewed Doctor Visits Reviewed, PCP  PCP/Specialist Visits Compliance with follow-up visit  Mental Health Interventions   Mental Health Discussed/Reviewed Mental Health Reviewed, Coping Strategies  Pharmacy Interventions   Pharmacy Dicussed/Reviewed Pharmacy Topics Discussed, Affording Medications  [new amlodipine]             Our next appointment is by telephone on 01/28/23 at 2:45 pm  Please call the care guide team at [phone] if you need to cancel or reschedule your appointment.   If you are experiencing a Mental Health or Behavioral Health Crisis or need someone to talk to, please call the Suicide and Crisis Lifeline: 988 call the [place] National  Suicide Prevention Lifeline: 515 563 6023 or TTY: 419-709-4076 TTY 308-239-7736) to talk to a trained counselor call 1-800-273-TALK (toll free, 24 hour hotline) call the [facility]: (534) 232-6342 call 911   Patient verbalizes understanding of instructions and care plan provided today and agrees to view in MyChart. Active MyChart status and patient understanding of how to access instructions and care plan via MyChart confirmed with patient.     The patient has been provided with contact information for the care management team and has been advised to call with any health related questions or concerns.   Izea Livolsi L. Noelle Penner, RN, BSN, CCM [facility] Care Management Community Coordinator Office number [phone]

## 2022-12-30 ENCOUNTER — Encounter: Payer: 59 | Admitting: Licensed Clinical Social Worker

## 2022-12-31 ENCOUNTER — Ambulatory Visit: Payer: Self-pay | Admitting: Licensed Clinical Social Worker

## 2022-12-31 NOTE — Patient Instructions (Signed)
Visit Information  Thank you for taking time to visit with me today. Please don't hesitate to contact me if I can be of assistance to you.   Following are the goals we discussed today:   Goals Addressed               This Visit's Progress     Patient Stated she is concerned about housing issues. She is looking for new apartment (pt-stated)        Interventions:  Spoke with client about client needs Discussed program support with [place] Discussed walking of client. Doyne gets fatigued occasionally when walking Discussed medication procurement Discussed needs of her sister who resides with [name]. Discussed transport needs. Client uses [employer] bus occasionally for transport Discussed mood. She is trying to look for new apartment. She is trying to manage anxiety and stress issues Provided counseling support Encouraged client to call LCSW as needed for SW support at [phone].          Our next appointment is by telephone on 02/03/23 at 2:30 PM   Please call the care guide team at [phone] if you need to cancel or reschedule your appointment.   If you are experiencing a Mental Health or Behavioral Health Crisis or need someone to talk to, please go to [facility] [address], [place] [phone])   The patient verbalized understanding of instructions, educational materials, and care plan provided today and DECLINED offer to receive copy of patient instructions, educational materials, and care plan.   The patient has been provided with contact information for the care management team and has been advised to call with any health related questions or concerns.   [name].Aleathia Purdy MSW, LCSW Licensed Visual merchandiser [facility] Care Management [phone]

## 2022-12-31 NOTE — Patient Outreach (Signed)
  Care Coordination   Follow Up Visit Note   12/31/2022 Name: Kailoni Brandenburg MRN: [id number] DOB: 1965/08/27  Menucha Bolz is a 57 y.o. year old female who sees [name], [name], MD for primary care. I spoke with  [name] by phone today.  What matters to the patients health and wellness today?  Patient is concerned about housing issues.  She is looking for new apartment    Goals Addressed               This Visit's Progress     Patient Stated she is concerned about housing issues. She is looking for new apartment (pt-stated)        Interventions:  Spoke with client about client needs Discussed program support with [place] Discussed walking of client. Yvanna gets fatigued occasionally when walking Discussed medication procurement Discussed needs of her sister who resides with [name]. Discussed transport needs. Client uses [employer] bus occasionally for transport Discussed mood. She is trying to look for new apartment. She is trying to manage anxiety and stress issues Provided counseling support Encouraged client to call LCSW as needed for SW support at [phone].          SDOH assessments and interventions completed:  Yes  SDOH Interventions Today    Flowsheet Row Most Recent Value  SDOH Interventions   Depression Interventions/Treatment  Counseling  Physical Activity Interventions Other (Comments)  [some walking challenges]  Stress Interventions Provide Counseling  [has stress in managing health needs]        Care Coordination Interventions:  Yes, provided   Interventions Today    Flowsheet Row Most Recent Value  Chronic Disease   Chronic disease during today's visit Other  [spoke with client about client needs]  General Interventions   General Interventions Discussed/Reviewed General Interventions Discussed, [employer]  [discussed program support]  Exercise Interventions   Exercise Discussed/Reviewed Physical Activity  [some walking challenges]   Physical Activity Discussed/Reviewed Physical Activity Reviewed  Education Interventions   Education Provided Provided Education  Provided Verbal Education On Community Resources  Mental Health Interventions   Mental Health Discussed/Reviewed Anxiety, Coping Strategies  [concerned about housng issues]  Nutrition Interventions   Nutrition Discussed/Reviewed Nutrition Discussed  Francis Dowse has U card from insurance provider and this helps with food for client]  Pharmacy Interventions   Pharmacy Dicussed/Reviewed Pharmacy Topics Discussed       Follow up plan: Follow up call scheduled for 02/03/23 at 2:30 PM     Encounter Outcome:  Pt. Visit Completed   [name].Deeanne Deininger MSW, LCSW Licensed Visual merchandiser [facility] Care Management [phone]

## 2023-01-06 ENCOUNTER — Inpatient Hospital Stay
Admission: RE | Admit: 2023-01-06 | Discharge: 2023-01-06 | Disposition: A | Discharge status: Home / Self Care | Payer: Self-pay | Source: Ambulatory Visit | Attending: Family Medicine | Admitting: Family Medicine

## 2023-01-06 ENCOUNTER — Other Ambulatory Visit (HOSPITAL_COMMUNITY): Payer: Self-pay | Admitting: Family Medicine

## 2023-01-06 DIAGNOSIS — N644 Mastodynia: Secondary | ICD-10-CM

## 2023-01-14 ENCOUNTER — Ambulatory Visit (HOSPITAL_COMMUNITY): Admission: RE | Admit: 2023-01-14 | Payer: 59 | Source: Ambulatory Visit

## 2023-01-14 ENCOUNTER — Ambulatory Visit (HOSPITAL_COMMUNITY)
Admission: RE | Admit: 2023-01-14 | Discharge: 2023-01-14 | Disposition: A | Discharge status: Home / Self Care | Payer: 59 | Source: Ambulatory Visit | Attending: Family Medicine | Admitting: Family Medicine

## 2023-01-14 DIAGNOSIS — N644 Mastodynia: Secondary | ICD-10-CM | POA: Diagnosis not present

## 2023-01-28 ENCOUNTER — Encounter: Payer: Self-pay | Admitting: *Deleted

## 2023-02-03 ENCOUNTER — Ambulatory Visit: Payer: Self-pay | Admitting: Licensed Clinical Social Worker

## 2023-02-03 NOTE — Patient Instructions (Signed)
Visit Information  Thank you for taking time to visit with me today. Please don't hesitate to contact me if I can be of assistance to you.   Following are the goals we discussed today:   Goals Addressed               This Visit's Progress     Patient Stated she is concerned about housing issues. She is looking for new apartment (pt-stated)        Interventions:  Spoke with client about client needs Discussed needs of her sister who resides with client Discussed food procurement. Client likes to cook to help her relax Discussed walking of client. She said she has some difficulty climbing stairs Discussed medication procurement Discussed transport needs. Client uses [employer] occasionally for transport or to go on daily activities Discussed mood. She feels that mood is stable. She relaxes by knitting. She likes to cook. She is trying to look for new apartment. She is trying to manage anxiety and stress issues. She updated Section 8 housing application Provided counseling support Discussed pain issues. She has some knee pain issues Discussed her use of U Card to help with food needs Discussed PCP support with Dr. [name].  Discussed walking of client. She likes to walk occasionally for exercise Encouraged client to call LCSW as needed for SW support at [phone].            Our next appointment is by telephone on 03/17/23 at 3:30 PM   Please call the care guide team at [phone] if you need to cancel or reschedule your appointment.   If you are experiencing a Mental Health or Behavioral Health Crisis or need someone to talk to, please go to [facility] [address], [place] [phone])   The patient verbalized understanding of instructions, educational materials, and care plan provided today and DECLINED offer to receive copy of patient instructions, educational materials, and care plan.   The patient has been provided with  contact information for the care management team and has been advised to call with any health related questions or concerns.   [name].Jens Siems MSW, LCSW Licensed Visual merchandiser [facility] Care Management [phone]

## 2023-02-03 NOTE — Patient Outreach (Signed)
  Care Coordination   Follow Up Visit Note   02/03/2023 Name: Beth Barber MRN: [id number] DOB: 1966-08-18  Beth Barber is a 57 y.o. year old female who sees Beth Barber, Beth Hazel, MD for primary care. I spoke with  Beth Barber by phone today.  What matters to the patients health and wellness today? Client is looking for new housing location     Goals Addressed               This Visit's Progress     Patient Stated she is concerned about housing issues. She is looking for new apartment (pt-stated)        Interventions:  Spoke with client about client needs Discussed needs of her sister who resides with client Discussed food procurement. Client likes to cook to help her relax Discussed walking of client. She said she has some difficulty climbing stairs Discussed medication procurement Discussed transport needs. Client uses [employer] occasionally for transport or to go on daily activities Discussed mood. She feels that mood is stable. She relaxes by knitting. She likes to cook. She is trying to look for new apartment. She is trying to manage anxiety and stress issues. She updated Section 8 housing application Provided counseling support Discussed pain issues. She has some knee pain issues Discussed her use of U Card to help with food needs Discussed PCP support with Dr. Margo Barber.  Discussed walking of client. She likes to walk occasionally for exercise Discussed apartment complexes in [place], [place] area Encouraged client to call LCSW as needed for SW support at [phone].            SDOH assessments and interventions completed:  Yes  SDOH Interventions Today    Flowsheet Row Most Recent Value  SDOH Interventions   Depression Interventions/Treatment  Counseling  Physical Activity Interventions Other (Comments)  [has some walking challenges]  Stress Interventions Other (Comment)  [stress related to managing medical needs. stress related to managing financial needs]        Care  Coordination Interventions:  Yes, provided   Interventions Today    Flowsheet Row Most Recent Value  Chronic Disease   Chronic disease during today's visit Other  [spoke with client about client needs]  General Interventions   General Interventions Discussed/Reviewed General Interventions Discussed, Community Resources  [spoke of program support]  Exercise Interventions   Exercise Discussed/Reviewed Physical Activity  Physical Activity Discussed/Reviewed Physical Activity Discussed  [some difficulty climbing stairs]  Education Interventions   Education Provided Provided Education  Provided Verbal Education On [employer]  [Discussed Section 8 Voucher application. She has updated application for Section 8]  Mental Health Interventions   Mental Health Discussed/Reviewed Coping Strategies, Anxiety  [discussed mood of client. She relaxes by sharing meals with her family. she likes to knit to relax.She does some craft activity]  Nutrition Interventions   Nutrition Discussed/Reviewed Nutrition Discussed  Pharmacy Interventions   Pharmacy Dicussed/Reviewed Pharmacy Topics Discussed        Follow up plan: Follow up call scheduled for 03/17/23 at 3:30 PM     Encounter Outcome:  Pt. Visit Completed   [name].Danyel Griess MSW, LCSW Licensed Visual merchandiser [facility] Care Management [phone]

## 2023-02-05 ENCOUNTER — Ambulatory Visit: Payer: Self-pay | Admitting: *Deleted

## 2023-02-05 NOTE — Patient Instructions (Addendum)
Visit Information  Thank you for taking time to visit with me today. Please don't hesitate to contact me if I can be of assistance to you.   Following are the goals we discussed today:   Goals Addressed             This Visit's Progress    Patient Stated:  Education and management of health conditions   On track    Care Coordination Interventions: Interventions Today    Flowsheet Row Most Recent Value  Chronic Disease   Chronic disease during today's visit Other  [knee pain, housing]  General Interventions   General Interventions Discussed/Reviewed General Interventions Reviewed, Doctor Visits, Communication with, Durable Medical Equipment (DME)  Doctor Visits Discussed/Reviewed Doctor Visits Reviewed, PCP, Specialist  Durable Medical Equipment (DME) Other  Terressa Koyanagi protection/brace]  PCP/Specialist Visits Compliance with follow-up visit  Communication with PCP/Specialists  [MD note for emotional support dog & lower level housing (chronic arthritic knee pain)]  Exercise Interventions   Exercise Discussed/Reviewed Exercise Reviewed, Physical Activity, Assistive device use and maintanence  Physical Activity Discussed/Reviewed Physical Activity Reviewed, Home Exercise Program (HEP)  Education Interventions   Education Provided Provided Web-based Education, Provided Education  [knee pain, knee pain exercises]  Provided Verbal Education On Medication  Mental Health Interventions   Mental Health Discussed/Reviewed Mental Health Reviewed, Coping Strategies  Pharmacy Interventions   Pharmacy Dicussed/Reviewed Pharmacy Topics Reviewed, Medications and their functions  Safety Interventions   Safety Discussed/Reviewed Safety Reviewed, Home Safety  Home Safety Assistive Devices             Our next appointment is by telephone on 03/18/23 at 1115  Please call the care guide team at [phone] if you need to cancel or reschedule your appointment.   If you are experiencing a Mental  Health or Behavioral Health Crisis or need someone to talk to, please call the Suicide and Crisis Lifeline: 988 call the [place] National Suicide Prevention Lifeline: 914-057-0288 or TTY: 934-627-8045 TTY (724)836-6718) to talk to a trained counselor call 1-800-273-TALK (toll free, 24 hour hotline) call the [facility]: ([phone] 012-7341 call 911   Patient verbalizes understanding of instructions and care plan provided today and agrees to view in MyChart. Active MyChart status and patient understanding of how to access instructions and care plan via MyChart confirmed with patient.     The patient has been provided with contact information for the care management team and has been advised to call with any health related questions or concerns.   Gabriell Daigneault L. Noelle Penner, RN, BSN, CCM [facility] Care Management Community Coordinator Office number [phone]

## 2023-02-05 NOTE — Patient Outreach (Addendum)
  Care Coordination   Follow Up Visit Note   02/05/2023 Name: Beth Barber MRN: [id number] DOB: 1966/07/30  Beth Barber is a 57 y.o. year old female who sees Beth Barber, Beth Hazel, MD for primary care. I spoke with  Beth Barber by phone today.  What matters to the patients health and wellness today?  Knees (arthritis) bother her coming up and down her stairs Uses Biofreeze cream or heat to help  Voiced understanding of muscle warm ups, knee protection and post comfort measures for pain   Housing Received a letter to update her section 8 waiting list Has been out observing houses with her son assistance in [place]   Goals Addressed             This Visit's Progress    Patient Stated:  Education officer, community of health conditions   On track    Care Coordination Interventions: Interventions Today    Flowsheet Row Most Recent Value  Chronic Disease   Chronic disease during today's visit Other  [knee pain, housing]  General Interventions   General Interventions Discussed/Reviewed General Interventions Reviewed, Doctor Visits, Communication with, Durable Medical Equipment (DME)  Doctor Visits Discussed/Reviewed Doctor Visits Reviewed, PCP, Specialist  Durable Medical Equipment (DME) Other  Beth Barber protection/brace]  PCP/Specialist Visits Compliance with follow-up visit  Communication with PCP/Specialists  [MD note for emotional support dog & lower level housing (chronic arthritic knee pain)]  Exercise Interventions   Exercise Discussed/Reviewed Exercise Reviewed, Physical Activity, Assistive device use and maintanence  Physical Activity Discussed/Reviewed Physical Activity Reviewed, Home Exercise Program (HEP)  Education Interventions   Education Provided Provided Web-based Education, Provided Education  [knee pain, knee pain exercises]  Provided Verbal Education On Medication  Mental Health Interventions   Mental Health Discussed/Reviewed Mental Health Reviewed, Coping Strategies  Pharmacy  Interventions   Pharmacy Dicussed/Reviewed Pharmacy Topics Reviewed, Medications and their functions  Safety Interventions   Safety Discussed/Reviewed Safety Reviewed, Home Safety  Home Safety Assistive Devices             SDOH assessments and interventions completed:  No     Care Coordination Interventions:  Yes, provided   Follow up plan: Follow up call scheduled for 03/18/23    Encounter Outcome:  Pt. Visit Completed    Beth Barber L. Beth Penner, RN, BSN, CCM [facility] Care Management Community Coordinator Office number [phone]

## 2023-03-17 ENCOUNTER — Ambulatory Visit: Payer: Self-pay | Admitting: Licensed Clinical Social Worker

## 2023-03-17 NOTE — Patient Outreach (Signed)
  Care Coordination   Follow Up Visit Note   03/17/2023 Name: Beth Barber MRN: [id number] DOB: 05/27/1966  Beth Barber is a 57 y.o. year old female who sees Beth Barber, Beth Hazel, MD for primary care. I spoke with  Beth Barber by phone today.  What matters to the patients health and wellness today? Patient is concerned about housing issues. She is looking for new apartment      Goals Addressed               This Visit's Progress     Patient Stated she is concerned about housing issues. She is looking for new apartment (pt-stated)        Interventions:  Spoke with client via phone today about client needs Spoke about needs of client's sister Discussed food procurement. Client likes to cook to help her relax Client has [employer] Card to help her with food needs Discussed walking of client. She said she has some difficulty climbing stairs Discussed medication procurement Discussed transport needs. Client uses [employer] bus occasionally for transport or to go on daily activities Discussed mood. She feels some anxiety related to managing finances . She is taking medications as prescribed Provided counseling support Discussed pain issues. She has some knee pain issues. She said she can walk short distances Discussed her use of U Card to help with food needs Discussed PCP support with Dr. Margo Barber.  Discussed walking of client. She likes to walk occasionally for exercise Discussed housing needs. She is hoping to get first floor apartment.  She is looking for apartment in area. Encouraged client to call LCSW as needed for SW support at [phone].            SDOH assessments and interventions completed:  Yes  SDOH Interventions Today    Flowsheet Row Most Recent Value  SDOH Interventions   Depression Interventions/Treatment  Counseling  Physical Activity Interventions Other (Comments)  [some mobility challenges]  Stress Interventions Provide Counseling  [stress related to managing medical needs]         Care Coordination Interventions:  Yes, provided   Interventions Today    Flowsheet Row Most Recent Value  Chronic Disease   Chronic disease during today's visit Other  [talked with client about client needs]  General Interventions   General Interventions Discussed/Reviewed General Interventions Discussed, [employer]  [discussed program support]  Exercise Interventions   Exercise Discussed/Reviewed Physical Activity  Education Interventions   Education Provided Provided Education  Provided Verbal Education On Community Resources  Mental Health Interventions   Mental Health Discussed/Reviewed Coping Strategies, Anxiety  [stress related to managing finances]  Nutrition Interventions   Nutrition Discussed/Reviewed Nutrition Discussed  Pharmacy Interventions   Pharmacy Dicussed/Reviewed Pharmacy Topics Discussed  Safety Interventions   Safety Discussed/Reviewed Fall Risk        Follow up plan: Follow up call scheduled for 05/05/23 at 3:00 PM     Encounter Outcome:  Pt. Visit Completed   Beth Barber.Beth Barber MSW, LCSW Licensed Visual merchandiser [facility] Care Management [phone]

## 2023-03-17 NOTE — Patient Instructions (Signed)
Visit Information  Thank you for taking time to visit with me today. Please don't hesitate to contact me if I can be of assistance to you.   Following are the goals we discussed today:   Goals Addressed               This Visit's Progress     Patient Stated she is concerned about housing issues. She is looking for new apartment (pt-stated)        Interventions:  Spoke with client via phone today about client needs Spoke about needs of client's sister Discussed food procurement. Client likes to cook to help her relax Client has [employer] Card to help her with food needs Discussed walking of client. She said she has some difficulty climbing stairs Discussed medication procurement Discussed transport needs. Client uses [employer] bus occasionally for transport or to go on daily activities Discussed mood. She feels some anxiety related to managing finances . She is taking medications as prescribed Provided counseling support Discussed pain issues. She has some knee pain issues. She said she can walk short distances Discussed her use of U Card to help with food needs Discussed PCP support with Dr. [name].  Discussed walking of client. She likes to walk occasionally for exercise Discussed housing needs. She is hoping to get first floor apartment.  She is looking for apartment in area. Encouraged client to call LCSW as needed for SW support at [phone].            Our next appointment is by telephone on 05/05/23 at 3:00 PM   Please call the care guide team at [phone] if you need to cancel or reschedule your appointment.   If you are experiencing a Mental Health or Behavioral Health Crisis or need someone to talk to, please go to [facility] [address] [place] [phone])   The patient verbalized understanding of instructions, educational materials, and care plan provided today and DECLINED offer to receive copy of patient instructions,  educational materials, and care plan.   The patient has been provided with contact information for the care management team and has been advised to call with any health related questions or concerns.   [name].Dede Dobesh MSW, LCSW Licensed Visual merchandiser [facility] Care Management [phone]

## 2023-03-18 ENCOUNTER — Ambulatory Visit: Payer: Self-pay | Admitting: *Deleted

## 2023-03-18 NOTE — Patient Outreach (Signed)
  Care Coordination   Follow Up Visit Note   03/19/2023 late entry for 03/18/23 Name: Beth Barber MRN: [id number] DOB: Dec 22, 1965  Beth Barber is a 57 y.o. year old female who sees [name], [name], MD for primary care. I spoke with  [name] by phone today.  What matters to the patients health and wellness today?  Housing- had an apartment opening but taken before she could decide on it plus had bushes  Son (30s) now staying with her, car towed. They will get his vehicle back on Friday and assist with transportation.   knees Walking- for exercise before 7 am before it get   Covid masks- needed  Goals Addressed             This Visit's Progress    Education and management of health conditions [facility] care coordination services       Care Coordination Interventions: Interventions Today    Flowsheet Row Most Recent Value  Chronic Disease   Chronic disease during today's visit Other  General Interventions   General Interventions Discussed/Reviewed General Interventions Reviewed, Communication with, [facility], Doctor Visits  Doctor Visits Discussed/Reviewed Doctor Visits Reviewed, PCP  PCP/Specialist Visits Compliance with follow-up visit  Communication with PCP/Specialists  [visited pcp office to obtain some masks for patient]  Exercise Interventions   Exercise Discussed/Reviewed Exercise Reviewed, Physical Activity  Physical Activity Discussed/Reviewed Physical Activity Reviewed, Types of exercise  [confirms she continues to walk her dog]  Education Interventions   Education Provided Provided Education  Provided Verbal Education On Mental Health/Coping with Illness, Other  [housing]  Mental Health Interventions   Mental Health Discussed/Reviewed Mental Health Reviewed, Coping Strategies  [coping son's car concerns]  Nutrition Interventions   Nutrition Discussed/Reviewed Nutrition Reviewed, Decreasing salt, Fluid intake  [using her Ucard to help with food]  Pharmacy  Interventions   Pharmacy Dicussed/Reviewed Pharmacy Topics Reviewed, Affording Medications              SDOH assessments and interventions completed:  No    Care Coordination Interventions:  Yes, provided   Follow up plan: Follow up call scheduled for 04/18/23    Encounter Outcome:  Pt. Visit Completed   Nephtali Docken L. Noelle Penner, RN, BSN, CCM [facility] Care Management Community Coordinator Office number [phone]

## 2023-03-19 NOTE — Patient Instructions (Signed)
Visit Information  Thank you for taking time to visit with me today. Please don't hesitate to contact me if I can be of assistance to you.   Following are the goals we discussed today:   Goals Addressed             This Visit's Progress    Education and management of health conditions [facility] care coordination services       Care Coordination Interventions: Interventions Today    Flowsheet Row Most Recent Value  Chronic Disease   Chronic disease during today's visit Other  General Interventions   General Interventions Discussed/Reviewed General Interventions Reviewed, Communication with, [facility], Doctor Visits  Doctor Visits Discussed/Reviewed Doctor Visits Reviewed, PCP  PCP/Specialist Visits Compliance with follow-up visit  Communication with PCP/Specialists  [visited pcp office to obtain some masks for patient]  Exercise Interventions   Exercise Discussed/Reviewed Exercise Reviewed, Physical Activity  Physical Activity Discussed/Reviewed Physical Activity Reviewed, Types of exercise  [confirms she continues to walk her dog]  Education Interventions   Education Provided Provided Education  Provided Verbal Education On Mental Health/Coping with Illness, Other  [housing]  Mental Health Interventions   Mental Health Discussed/Reviewed Mental Health Reviewed, Coping Strategies  [coping son's car concerns]  Nutrition Interventions   Nutrition Discussed/Reviewed Nutrition Reviewed, Decreasing salt, Fluid intake  [using her Ucard to help with food]  Pharmacy Interventions   Pharmacy Dicussed/Reviewed Pharmacy Topics Reviewed, Affording Medications              Our next appointment is by telephone on 04/18/23 at 1130  Please call the care guide team at [phone] if you need to cancel or reschedule your appointment.   If you are experiencing a Mental Health or Behavioral Health Crisis or need someone to talk to, please call the Suicide and Crisis Lifeline:  988 call the [place] National Suicide Prevention Lifeline: 252-011-1429 or TTY: 513-037-5545 TTY 4034009929) to talk to a trained counselor call 1-800-273-TALK (toll free, 24 hour hotline) call the [employer]: (520)190-4270 call 911   Patient verbalizes understanding of instructions and care plan provided today and agrees to view in MyChart. Active MyChart status and patient understanding of how to access instructions and care plan via MyChart confirmed with patient.     The patient has been provided with contact information for the care management team and has been advised to call with any health related questions or concerns.    Blayton Huttner L. Noelle Penner, RN, BSN, CCM [facility] Care Management Community Coordinator Office number [phone]

## 2023-04-08 ENCOUNTER — Other Ambulatory Visit: Payer: Self-pay

## 2023-04-08 DIAGNOSIS — C8190 Hodgkin lymphoma, unspecified, unspecified site: Secondary | ICD-10-CM

## 2023-04-09 ENCOUNTER — Inpatient Hospital Stay: Payer: 59 | Attending: Hematology

## 2023-04-09 DIAGNOSIS — Z87891 Personal history of nicotine dependence: Secondary | ICD-10-CM | POA: Diagnosis not present

## 2023-04-09 DIAGNOSIS — Z8571 Personal history of Hodgkin lymphoma: Secondary | ICD-10-CM | POA: Diagnosis not present

## 2023-04-09 DIAGNOSIS — C8190 Hodgkin lymphoma, unspecified, unspecified site: Secondary | ICD-10-CM

## 2023-04-09 LAB — CBC WITH DIFFERENTIAL/PLATELET
Abs Immature Granulocytes: 0.01 10*3/uL (ref 0.00–0.07)
Basophils Absolute: 0 10*3/uL (ref 0.0–0.1)
Basophils Relative: 0 %
Eosinophils Absolute: 0 10*3/uL (ref 0.0–0.5)
Eosinophils Relative: 1 %
HCT: 40.3 % (ref 36.0–46.0)
Hemoglobin: 13.1 g/dL (ref 12.0–15.0)
Immature Granulocytes: 0 %
Lymphocytes Relative: 39 %
Lymphs Abs: 1.6 10*3/uL (ref 0.7–4.0)
MCH: 26.9 pg (ref 26.0–34.0)
MCHC: 32.5 g/dL (ref 30.0–36.0)
MCV: 82.8 fL (ref 80.0–100.0)
Monocytes Absolute: 0.5 10*3/uL (ref 0.1–1.0)
Monocytes Relative: 11 %
Neutro Abs: 2 10*3/uL (ref 1.7–7.7)
Neutrophils Relative %: 49 %
Platelets: 250 10*3/uL (ref 150–400)
RBC: 4.87 MIL/uL (ref 3.87–5.11)
RDW: 16 % — ABNORMAL HIGH (ref 11.5–15.5)
WBC: 4.2 10*3/uL (ref 4.0–10.5)
nRBC: 0 % (ref 0.0–0.2)

## 2023-04-09 LAB — COMPREHENSIVE METABOLIC PANEL
ALT: 29 U/L (ref 0–44)
AST: 36 U/L (ref 15–41)
Albumin: 3.8 g/dL (ref 3.5–5.0)
Alkaline Phosphatase: 56 U/L (ref 38–126)
Anion gap: 9 (ref 5–15)
BUN: 11 mg/dL (ref 6–20)
CO2: 22 mmol/L (ref 22–32)
Calcium: 9.2 mg/dL (ref 8.9–10.3)
Chloride: 105 mmol/L (ref 98–111)
Creatinine, Ser: 0.96 mg/dL (ref 0.44–1.00)
GFR, Estimated: >60 mL/min (ref >60)
Glucose, Bld: 120 mg/dL — ABNORMAL HIGH (ref 70–99)
Potassium: 4 mmol/L (ref 3.5–5.1)
Sodium: 136 mmol/L (ref 135–145)
Total Bilirubin: 0.6 mg/dL (ref 0.3–1.2)
Total Protein: 7.9 g/dL (ref 6.5–8.1)

## 2023-04-09 LAB — SEDIMENTATION RATE: Sed Rate: 20 mm/hr (ref 0–22)

## 2023-04-09 LAB — LACTATE DEHYDROGENASE: LDH: 168 U/L (ref 98–192)

## 2023-04-15 NOTE — Progress Notes (Signed)
[facility] [address], [place] [address]    Clinic Day:  04/16/2023  Referring physician: Benita Stabile, MD  Patient Care Team: Benita Stabile, MD as PCP - General (Internal Medicine) Doreatha Massed, MD as Medical Oncologist (Medical Oncology) Clinton Gallant, RN as [facility] Care Management Forrest, [name], LCSW as [facility] Care Management (Licensed Clinical Social Worker)   ASSESSMENT & PLAN:   Assessment: 1.  Stage IIb Hodgkin's lymphoma: - Presentation with left supraclavicular lymphadenopathy. - Status post ABVD for 6 months completed in 04/30/2013. - Last CT CAP (07/25/2018): Negative for recurrent lymphoma - Last seen by Dr. Drucilla Schmidt in [place] on 03/01/2021.   2.  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  She had exposure to asbestos.  She recently moved to [place] in August 2022.  She takes care of her sister.  She is a 1 pack/day smoker for 34 years, quit in 2020/04/30. - Mother died of colon cancer but also had throat cancer and breast cancer.  Father had cancer, type not known to the patient.  Brother had throat cancer.  Sister had?  Sarcoma of the thigh with metastasis to the lungs.    Plan: 1.  Stage IIb Hodgkin's lymphoma: - Denies any infections in the last 1 year. - Denies fevers, night sweats or weight loss. - Physical exam: Small shotty lymph nodes at the site of previous biopsy in the left supraclavicular area.  No other lymphadenopathy or splenomegaly. - Labs: LDH, LFTs and CBC were grossly normal.  ESR was normal. - Mammogram on 01/14/2023 was BI-RADS Category 1. - RTC 1 year for follow-up with repeat labs and exam.    Orders Placed This Encounter  Procedures   CBC with Differential    Standing Status:   Future    Standing Expiration Date:   04/15/2024   Comprehensive metabolic panel    Standing Status:   Future    Standing Expiration Date:   04/15/2024    Lactate dehydrogenase    Standing Status:   Future    Standing Expiration Date:   04/15/2024   Sedimentation rate    Standing Status:   Future    Standing Expiration Date:   04/15/2024      I,Katie Daubenspeck,acting as a scribe for Doreatha Massed, MD.,have documented all relevant documentation on the behalf of Doreatha Massed, MD,as directed by  Doreatha Massed, MD while in the presence of Doreatha Massed, MD.   I, Doreatha Massed MD, have reviewed the above documentation for accuracy and completeness, and I agree with the above.   Doreatha Massed, MD   8/28/20245:14 PM  CHIEF COMPLAINT:   Diagnosis: history of stage IIb Hodgkin's lymphoma    Cancer Staging  No matching staging information was found for the patient.    Prior Therapy: ABVD, 6 months in 30-Apr-2013  Current Therapy:  surveillance   HISTORY OF PRESENT ILLNESS:   Oncology History   No history exists.     INTERVAL HISTORY:   Beth Barber is a 57 y.o. female presenting to clinic today for follow up of history of stage IIb Hodgkin's lymphoma. She was last seen by me on 04/08/22.  Today, she states that she is doing well overall. Her appetite level is at 50%. Her energy level is at 75%.  PAST MEDICAL HISTORY:   Past Medical History: Past Medical History:  Diagnosis Date   Asthma    Cancer (  HCC)    Complication of anesthesia    difficult to wake up after anesthesia   GERD (gastroesophageal reflux disease)    Hypertension     Surgical History: Past Surgical History:  Procedure Laterality Date   ABDOMINAL HYSTERECTOMY     BIOPSY  10/15/2021   Procedure: BIOPSY;  Surgeon: Lanelle Bal, DO;  [facility];  Service: Endoscopy;;   COLONOSCOPY WITH PROPOFOL N/A 10/15/2021   Procedure: COLONOSCOPY WITH PROPOFOL;  Surgeon: Lanelle Bal, DO;  [facility];  Service: Endoscopy;  Laterality: N/A;  2:30 / ASA 3   POLYPECTOMY  10/15/2021   Procedure: POLYPECTOMY;   Surgeon: Lanelle Bal, DO;  [facility];  Service: Endoscopy;;   PORTACATH PLACEMENT     portacath removal      Social History: Social History   Socioeconomic History   Marital status: Single    Spouse name: Not on file   Number of children: Not on file   Years of education: Not on file   Highest education level: Not on file  Occupational History   Not on file  Tobacco Use   Smoking status: Former    Current packs/day: 0.00    Types: Cigarettes    Quit date: 05/24/2020    Years since quitting: 2.8   Smokeless tobacco: Never  Vaping Use   Vaping status: Never Used  Substance and Sexual Activity   Alcohol use: Yes    Comment: Pt. states sometimes.   Drug use: Never   Sexual activity: Not Currently  Other Topics Concern   Not on file  Social History Narrative   Lives with sister, terrier dog    She takes care of her sister    Had 5 children   Social Determinants of Health   Financial Resource Strain: Low Risk  (10/11/2022)   Overall Financial Resource Strain (CARDIA)    Difficulty of Paying Living Expenses: Not hard at all  Food Insecurity: No Food Insecurity (10/11/2022)   Hunger Vital Sign    Worried About Running Out of Food in the Last Year: Never true    Ran Out of Food in the Last Year: Never true  Transportation Needs: Unmet Transportation Needs (11/27/2022)   PRAPARE - Administrator, Civil Service (Medical): Yes    Lack of Transportation (Non-Medical): No  Physical Activity: Inactive (03/17/2023)   Exercise Vital Sign    Days of Exercise per Week: 0 days    Minutes of Exercise per Session: 0 min  Stress: Stress Concern Present (03/17/2023)   [employer] of Occupational Health - Occupational Stress Questionnaire    Feeling of Stress : To some extent  Social Connections: Moderately Integrated (10/03/2022)   Social Connection and Isolation Panel [NHANES]    Frequency of Communication with Friends and Family: More than three  times a week    Frequency of Social Gatherings with Friends and Family: More than three times a week    Attends Religious Services: 1 to 4 times per year    Active Member of [employer] or Organizations: Yes    Attends Banker Meetings: 1 to 4 times per year    Marital Status: Never married  Intimate Partner Violence: Not At Risk (10/03/2022)   Humiliation, Afraid, Rape, and Kick questionnaire    Fear of Current or Ex-Partner: No    Emotionally Abused: No    Physically Abused: No    Sexually Abused: No  Family History: Family History  Problem Relation Age of Onset   Cancer Mother    Cancer Father    Cancer Sister    Cancer Brother     Current Medications:  Current Outpatient Medications:    albuterol (PROVENTIL) (2.5 MG/3ML) 0.083% nebulizer solution, USE 1 VIAL IN NEBULIZER THREE TIMES DAILY AS NEEDED, Disp: , Rfl:    albuterol (VENTOLIN HFA) 108 (90 Base) MCG/ACT inhaler, Inhale 1-2 puffs into the lungs every 6 (six) hours as needed for wheezing or shortness of breath., Disp: , Rfl:    albuterol (VENTOLIN HFA) 108 (90 Base) MCG/ACT inhaler, Inhale 2 puffs into the lungs every 4 (four) hours as needed., Disp: , Rfl:    amLODipine (NORVASC) 5 MG tablet, Take 1 tablet every day by oral route for 90 days., Disp: , Rfl:    Borage, Borago officinalis, (BORAGE OIL PO), Take 1 capsule by mouth daily., Disp: , Rfl:    Cholecalciferol (VITAMIN D) 50 MCG (2000 UT) CAPS, Take 4,000 Units by mouth daily., Disp: , Rfl:    clobetasol ointment (TEMOVATE) 0.05 %, APPLY A THIN LAYER TO THE AFEFCTED AREA TWICE DAILY, Disp: , Rfl:    famotidine (PEPCID) 20 MG tablet, Take 1 tablet by mouth daily., Disp: , Rfl:    Flax Oil-Fish Oil-Borage Oil (FISH OIL-FLAX OIL-BORAGE OIL PO), Take by mouth., Disp: , Rfl:    halobetasol (ULTRAVATE) 0.05 % cream, APPLY CREAM TOPICALLY TO HANDS TWICE DAILY AS NEEDED. (NOT TO FACE, GROIN OR UNDERARMS), Disp: , Rfl:    triamcinolone cream (KENALOG) 0.1 %, APPLY  A THIN LAYER TO THE AFFECTED AREA(S) BY TOPICAL ROUTE 2 TIMES PER DAY, Disp: , Rfl:    Allergies: Allergies  Allergen Reactions   Hydrocodone-Acetaminophen Hives    vicodin    REVIEW OF SYSTEMS:   Review of Systems  Constitutional:  Negative for chills, fatigue and fever.  HENT:   Negative for lump/mass, mouth sores, nosebleeds, sore throat and trouble swallowing.   Eyes:  Negative for eye problems.  Respiratory:  Positive for shortness of breath. Negative for cough.   Cardiovascular:  Negative for chest pain, leg swelling and palpitations.  Gastrointestinal:  Negative for abdominal pain, constipation, diarrhea, nausea and vomiting.  Genitourinary:  Negative for bladder incontinence, difficulty urinating, dysuria, frequency, hematuria and nocturia.   Musculoskeletal:  Negative for arthralgias, back pain, flank pain, myalgias and neck pain.  Skin:  Negative for itching and rash.  Neurological:  Positive for headaches and numbness. Negative for dizziness.  Hematological:  Does not bruise/bleed easily.  Psychiatric/Behavioral:  Negative for depression, sleep disturbance and suicidal ideas. The patient is not nervous/anxious.   All other systems reviewed and are negative.    VITALS:   Blood pressure 134/86, pulse 69, temperature 98.8 F (37.1 C), temperature source Oral, resp. rate 16, weight 225 lb 8.5 oz (102.3 kg), SpO2 96%.  Wt Readings from Last 3 Encounters:  04/16/23 225 lb 8.5 oz (102.3 kg)  08/01/22 205 lb (93 kg)  04/08/22 205 lb 9.6 oz (93.3 kg)    Body mass index is 37.53 kg/m.  Performance status (ECOG): 1 - Symptomatic but completely ambulatory  PHYSICAL EXAM:   Physical Exam Vitals and nursing note reviewed. Exam conducted with a chaperone present.  Constitutional:      Appearance: Normal appearance.  Cardiovascular:     Rate and Rhythm: Normal rate and regular rhythm.     Pulses: Normal pulses.     Heart sounds: Normal heart sounds.  Pulmonary:      Effort: Pulmonary effort is normal.     Breath sounds: Normal breath sounds.  Abdominal:     Palpations: Abdomen is soft. There is no hepatomegaly, splenomegaly or mass.     Tenderness: There is no abdominal tenderness.  Musculoskeletal:     Right lower leg: No edema.     Left lower leg: No edema.  Lymphadenopathy:     Cervical: No cervical adenopathy.     Right cervical: No superficial, deep or posterior cervical adenopathy.    Left cervical: No superficial, deep or posterior cervical adenopathy.     Upper Body:     Right upper body: No supraclavicular or axillary adenopathy.     Left upper body: No supraclavicular or axillary adenopathy.  Neurological:     General: No focal deficit present.     Mental Status: She is alert and oriented to person, place, and time.  Psychiatric:        Mood and Affect: Mood normal.        Behavior: Behavior normal.     LABS:      Latest Ref Rng & Units 04/09/2023    9:01 AM 04/08/2022    8:48 AM 06/17/2021    2:57 PM  CBC  WBC 4.0 - 10.5 K/uL 4.2  4.3  4.9   Hemoglobin 12.0 - 15.0 g/dL 16.1  09.6  04.5   Hematocrit 36.0 - 46.0 % 40.3  39.3  38.1   Platelets 150 - 400 K/uL 250  237  256       Latest Ref Rng & Units 04/09/2023    9:01 AM 04/08/2022    8:48 AM 10/12/2021    1:29 PM  CMP  Glucose 70 - 99 mg/dL 409  811  914   BUN 6 - 20 mg/dL 11  12  13    Creatinine 0.44 - 1.00 mg/dL 7.82  9.56  2.13   Sodium 135 - 145 mmol/L 136  136  138   Potassium 3.5 - 5.1 mmol/L 4.0  3.9  3.9   Chloride 98 - 111 mmol/L 105  104  107   CO2 22 - 32 mmol/L 22  25  25    Calcium 8.9 - 10.3 mg/dL 9.2  9.4  8.9   Total Protein 6.5 - 8.1 g/dL 7.9  8.1    Total Bilirubin 0.3 - 1.2 mg/dL 0.6  0.5    Alkaline Phos 38 - 126 U/L 56  51    AST 15 - 41 U/L 36  28    ALT 0 - 44 U/L 29  25       No results found for: "CEA1", "CEA" / No results found for: "CEA1", "CEA" No results found for: "PSA1" No results found for: "YQM578" No results found for: "CAN125"   No results found for: "TOTALPROTELP", "ALBUMINELP", "A1GS", "A2GS", "BETS", "BETA2SER", "GAMS", "MSPIKE", "SPEI" No results found for: "TIBC", "FERRITIN", "IRONPCTSAT" Lab Results  Component Value Date   LDH 168 04/09/2023   LDH 144 04/08/2022     STUDIES:   No results found.

## 2023-04-16 ENCOUNTER — Inpatient Hospital Stay: Payer: 59 | Admitting: Hematology

## 2023-04-16 VITALS — BP 134/86 | HR 69 | Temp 98.8°F | Resp 16 | Wt 225.5 lb

## 2023-04-16 DIAGNOSIS — C8190 Hodgkin lymphoma, unspecified, unspecified site: Secondary | ICD-10-CM | POA: Diagnosis not present

## 2023-04-16 DIAGNOSIS — Z8571 Personal history of Hodgkin lymphoma: Secondary | ICD-10-CM | POA: Diagnosis not present

## 2023-04-16 DIAGNOSIS — Z87891 Personal history of nicotine dependence: Secondary | ICD-10-CM | POA: Diagnosis not present

## 2023-04-16 NOTE — Patient Instructions (Signed)
[facility] at [facility] Discharge Instructions   You were seen and examined today by Dr. Ellin Saba.  He reviewed the results of your lab work which are normal/stable.   We will see you back in 1 year. We will repeat lab work prior to this visit.   Return as scheduled.    Thank you for choosing [facility] at [facility] to provide your oncology and hematology care.  To afford each patient quality time with our provider, please arrive at least 15 minutes before your scheduled appointment time.   If you have a lab appointment with the Cancer Center please come in thru the Main Entrance and check in at the main information desk.  You need to re-schedule your appointment should you arrive 10 or more minutes late.  We strive to give you quality time with our providers, and arriving late affects you and other patients whose appointments are after yours.  Also, if you no show three or more times for appointments you may be dismissed from the clinic at the providers discretion.     Again, thank you for choosing [facility].  Our hope is that these requests will decrease the amount of time that you wait before being seen by our physicians.       _____________________________________________________________  Should you have questions after your visit to [facility], please contact our office at [phone] and follow the prompts.  Our office hours are 8:00 a.m. and 4:30 p.m. Monday - Friday.  Please note that voicemails left after 4:00 p.m. may not be returned until the following business day.  We are closed weekends and major holidays.  You do have access to a nurse 24-7, just call the main number to the clinic [phone] and do not press any options, hold on the line and a nurse will answer the phone.    For prescription refill requests, have your pharmacy contact our office and allow 72 hours.    Due to Covid, you will  need to wear a mask upon entering the hospital. If you do not have a mask, a mask will be given to you at the Main Entrance upon arrival. For doctor visits, patients may have 1 support person age 36 or older with them. For treatment visits, patients can not have anyone with them due to social distancing guidelines and our immunocompromised population.

## 2023-04-18 ENCOUNTER — Ambulatory Visit: Payer: Self-pay | Admitting: *Deleted

## 2023-04-18 NOTE — Patient Outreach (Signed)
Care Coordination   Follow Up Visit Note   06/02/2023 update for 04/18/23 Name: Beth Barber MRN: [id number] DOB: [birth year]  Beth Barber is a 57 y.o. year old female who sees [name], [name], MD for primary care. I spoke with  Beth Barber by phone today.  What matters to the patients health and wellness today?  Oncology- good report from Dr Ellin Saba related to her lymphoma  Recent labs only indicate an elevation in glucose to 120 No history of Diabetes  Up to 225 lbs  and voices concerns about this Has noted some legs swelling as evidence by imprints noted around ankle when socks removed Stopped her muscle spasm medicines had s/e of blurred vision/ possible edema, no further blurred vision when medicine stopped  Asked questions about a neighbor who does not able to read & write well with social concern. Neighbor voiced concern of being taken advantage of. Patient confirms she has guide him to resources but he does not follow through. She will set boundaries.  Need dentist and eye and states she will outreach to her navigator to assist with appointments and transportation. Confirmed she did print off a list of providers at home   Provided her with oral surgeon MDs Ocie Doyne and Nilda Simmer in [place]  that are in network per hr national  [employer] care [facility]) online site   Asked about keto bread use for weight loss    Goals Addressed             This Visit's Progress    Education and management of health conditions - care coordination services   On track    Care Coordination Interventions: Interventions Today    Flowsheet Row Most Recent Value  Chronic Disease   Chronic disease during today's visit Other  [increased weight, dental & eye providers, leg swelling, literacy of a neighbor]  General Interventions   General Interventions Discussed/Reviewed General Interventions Reviewed, Doctor Visits, Health Screening  Doctor Visits Discussed/Reviewed Doctor Visits  Reviewed, PCP  Health Screening --  [Provided her with oral surgeon MDs Ocie Doyne and [place] in [place]   that are in network per hr national  [employer] care [facility]) online site]  PCP/Specialist Visits Compliance with follow-up visit  Exercise Interventions   Exercise Discussed/Reviewed Exercise Reviewed, Weight Managment  Weight Management Weight loss  Education Interventions   Education Provided Provided Education  [employer], keto bread. literacy vs othe rmental disorders, Elder resources for her neighbor]  Provided Verbal Education On [facility], Eye Care, Other  [dental & eye care]  Mental Health Interventions   Mental Health Discussed/Reviewed Mental Health Reviewed, Other  Nutrition Interventions   Nutrition Discussed/Reviewed Portion sizes, Nutrition Reviewed, Increasing proteins, Fluid intake  Pharmacy Interventions   Pharmacy Dicussed/Reviewed Pharmacy Topics Reviewed, Affording Medications              SDOH assessments and interventions completed:  No     Care Coordination Interventions:  Yes, provided   Follow up plan: Follow up call scheduled for 05/28/23    Encounter Outcome:  Pt. Visit Completed   Atina Feeley L. Noelle Penner, RN, BSN, CCM, Care Management Coordinator [phone]

## 2023-04-18 NOTE — Patient Instructions (Signed)
Visit Information  Thank you for taking time to visit with me today. Please don't hesitate to contact me if I can be of assistance to you.   Following are the goals we discussed today:   Goals Addressed             This Visit's Progress    Education and management of health conditions - care coordination services   On track    Care Coordination Interventions: Interventions Today    Flowsheet Row Most Recent Value  Chronic Disease   Chronic disease during today's visit Other  [increased weight, dental & eye providers, leg swelling, literacy of a neighbor]  General Interventions   General Interventions Discussed/Reviewed General Interventions Reviewed, Doctor Visits, Health Screening  Doctor Visits Discussed/Reviewed Doctor Visits Reviewed, PCP  Health Screening --  [Provided her with oral surgeon MDs Ocie Doyne and Nilda Simmer in [place]  that are in network per hr national  [employer] care [facility]) online site]  PCP/Specialist Visits Compliance with follow-up visit  Exercise Interventions   Exercise Discussed/Reviewed Exercise Reviewed, Weight Managment  Weight Management Weight loss  Education Interventions   Education Provided Provided Education  [employer], keto bread. literacy vs othe rmental disorders, Elder resources for her neighbor]  Provided Verbal Education On [facility], Eye Care, Other  [dental & eye care]  Mental Health Interventions   Mental Health Discussed/Reviewed Mental Health Reviewed, Other  Nutrition Interventions   Nutrition Discussed/Reviewed Portion sizes, Nutrition Reviewed, Increasing proteins, Fluid intake  Pharmacy Interventions   Pharmacy Dicussed/Reviewed Pharmacy Topics Reviewed, Affording Medications              Our next appointment is by telephone on 05/28/23 at 1130  Please call the care guide team at [phone] if you need to cancel or reschedule your appointment.   If you are experiencing a Mental  Health or Behavioral Health Crisis or need someone to talk to, please call the Suicide and Crisis Lifeline: 988 call the [place] National Suicide Prevention Lifeline: 774-592-7841 or TTY: 346-007-7245 TTY (928)041-7092) to talk to a trained counselor call 1-800-273-TALK (toll free, 24 hour hotline) call the [facility]: (260) 592-0515 call 911   Patient verbalizes understanding of instructions and care plan provided today and agrees to view in MyChart. Active MyChart status and patient understanding of how to access instructions and care plan via MyChart confirmed with patient.     The patient has been provided with contact information for the care management team and has been advised to call with any health related questions or concerns.   Jere Vanburen L. Noelle Penner, RN, BSN, CCM, Care Management Coordinator [phone]

## 2023-04-25 DIAGNOSIS — R0600 Dyspnea, unspecified: Secondary | ICD-10-CM | POA: Insufficient documentation

## 2023-04-25 DIAGNOSIS — L0292 Furuncle, unspecified: Secondary | ICD-10-CM | POA: Insufficient documentation

## 2023-04-25 DIAGNOSIS — N491 Inflammatory disorders of spermatic cord, tunica vaginalis and vas deferens: Secondary | ICD-10-CM | POA: Insufficient documentation

## 2023-05-05 ENCOUNTER — Ambulatory Visit: Payer: Self-pay | Admitting: Licensed Clinical Social Worker

## 2023-05-05 DIAGNOSIS — Z87891 Personal history of nicotine dependence: Secondary | ICD-10-CM | POA: Diagnosis not present

## 2023-05-05 DIAGNOSIS — Z79899 Other long term (current) drug therapy: Secondary | ICD-10-CM | POA: Diagnosis not present

## 2023-05-05 DIAGNOSIS — R600 Localized edema: Secondary | ICD-10-CM | POA: Diagnosis not present

## 2023-05-05 DIAGNOSIS — Z713 Dietary counseling and surveillance: Secondary | ICD-10-CM | POA: Diagnosis not present

## 2023-05-05 DIAGNOSIS — Z7182 Exercise counseling: Secondary | ICD-10-CM | POA: Diagnosis not present

## 2023-05-05 DIAGNOSIS — I10 Essential (primary) hypertension: Secondary | ICD-10-CM | POA: Diagnosis not present

## 2023-05-05 NOTE — Patient Instructions (Signed)
Visit Information  Thank you for taking time to visit with me today. Please don't hesitate to contact me if I can be of assistance to you.   Following are the goals we discussed today:   Goals Addressed               This Visit's Progress     Patient Stated she is concerned about housing issues. She is looking for new apartment (pt-stated)        Interventions:  Spoke with client via phone today about client  current status and health needs Spoke about needs of client's sister. Client's sister resides with client Discussed food procurement. Client likes to cook to help her relax Client has [employer] to help her with food needs Discussed walking of client. She said she has some difficulty climbing stairs .  She has some swelling in her ankles Discussed medication procurement Discussed transport needs. Client uses [employer] occasionally for transport or to go on daily activities Discussed mood. She feels some anxiety related to managing her health needs Provided counseling support Discussed pain issues. She has some knee pain issues. She said she can walk short distances Discussed PCP support with Dr. [name].  Discussed housing needs. She is hoping to get first floor apartment.  Discussed family support Encouraged client to call LCSW as needed for SW support at [phone].            Our next appointment is by telephone on 07/08/23 at 1:00 PM   Please call the care guide team at [phone] if you need to cancel or reschedule your appointment.   If you are experiencing a Mental Health or Behavioral Health Crisis or need someone to talk to, please go to [facility] [address], [place] [phone])   The patient verbalized understanding of instructions, educational materials, and care plan provided today and DECLINED offer to receive copy of patient instructions, educational materials, and care plan.   The patient has been provided  with contact information for the care management team and has been advised to call with any health related questions or concerns.   [name].Sorayah Schrodt MSW, LCSW Licensed Visual merchandiser [facility] Care Management [phone]

## 2023-05-05 NOTE — Patient Outreach (Signed)
Care Coordination   Follow Up Visit Note   05/05/2023 Name: Beth Barber MRN: [id number] DOB: 06/13/1966  Beth Barber is a 57 y.o. year old female who sees [name], [name], MD for primary care. I spoke with  Beth Barber by phone today.  What matters to the patients health and wellness today? Patient stated she is concerned about housing issues. She is looking for a new apartment     Goals Addressed               This Visit's Progress     Patient Stated she is concerned about housing issues. She is looking for new apartment (pt-stated)        Interventions:  Spoke with client via phone today about client  current status and health needs Spoke about needs of client's sister. Client's sister resides with client Discussed food procurement. Client likes to cook to help her relax Client has [employer] to help her with food needs Discussed walking of client. She said she has some difficulty climbing stairs .  She has some swelling in her ankles Discussed medication procurement Discussed transport needs. Client uses [employer] occasionally for transport or to go on daily activities Discussed mood. She feels some anxiety related to managing her health needs Provided counseling support Discussed pain issues. She has some knee pain issues. She said she can walk short distances Discussed PCP support with Dr. [name].  Discussed housing needs. She is hoping to get first floor apartment.  Discussed family support Encouraged client to call LCSW as needed for SW support at [phone].            SDOH assessments and interventions completed:  Yes  SDOH Interventions Today    Flowsheet Row Most Recent Value  SDOH Interventions   Depression Interventions/Treatment  Counseling  Physical Activity Interventions Other (Comments)  [some mobility issues]  Stress Interventions Provide Counseling  [has stress in managing medical needs. Has had some swelling in her ankles. Saw PCP today about swelling in  her ankles]        Care Coordination Interventions:  Yes, provided   Interventions Today    Flowsheet Row Most Recent Value  Chronic Disease   Chronic disease during today's visit Other  [spoke with client about client needs]  General Interventions   General Interventions Discussed/Reviewed General Interventions Discussed, Community Resources  Exercise Interventions   Exercise Discussed/Reviewed Physical Activity  Physical Activity Discussed/Reviewed Physical Activity Discussed  [has some swelling in her ankles]  Education Interventions   Education Provided Provided Education  Provided Verbal Education On [facility]  Mental Health Interventions   Mental Health Discussed/Reviewed Coping Strategies  [mood discussed. Sad sometimes related to managing health needs.]  Nutrition Interventions   Nutrition Discussed/Reviewed Nutrition Discussed  [client spoke of having reduced appetite]  Pharmacy Interventions   Pharmacy Dicussed/Reviewed Pharmacy Topics Discussed        Follow up plan: Follow up call scheduled for 07/08/23 at 1:00 PM     Encounter Outcome:  Patient Visit Completed   [name].Lenola Lockner MSW, LCSW Licensed Visual merchandiser [facility] Care Management [phone]

## 2023-05-28 ENCOUNTER — Ambulatory Visit: Payer: Self-pay | Admitting: *Deleted

## 2023-05-28 NOTE — Patient Outreach (Signed)
Care Coordination   Follow Up Visit Note   06/02/2023 late entry for 05/28/23 Name: Beth Barber MRN: [id number] DOB: 09-Oct-1965  Beth Barber is a 57 y.o. year old female who sees [name], [name], MD for primary care. I spoke with  [name] by phone today.  What matters to the patients health and wellness today?  She is now interested in [address]  [place] on right hip & shoulder & twisted her ankle. She was able to walk to her neighborhood stores Pain in hip back and right shoulder with a slight headache    Goals Addressed             This Visit's Progress    Education and management of health conditions - care coordination services       Care Coordination Interventions: Interventions Today    Flowsheet Row Most Recent Value  Chronic Disease   Chronic disease during today's visit Other  [increased weight, dental & eye providers, leg swelling, literacy of a neighbor]  General Interventions   General Interventions Discussed/Reviewed General Interventions Reviewed, Doctor Visits, Health Screening  Doctor Visits Discussed/Reviewed Doctor Visits Reviewed, PCP  Health Screening --  [Provided her with oral surgeon MDs Ocie Doyne and Nilda Simmer in [place]  that are in network per hr national  [employer] care [facility]) online site]  PCP/Specialist Visits Compliance with follow-up visit  Exercise Interventions   Exercise Discussed/Reviewed Exercise Reviewed, Weight Managment  Weight Management Weight loss  Education Interventions   Education Provided Provided Education  [employer], keto bread. literacy vs othe rmental disorders, Elder resources for her neighbor]  Provided Verbal Education On [facility], Eye Care, Other  [dental & eye care]  Mental Health Interventions   Mental Health Discussed/Reviewed Mental Health Reviewed, Other  Nutrition Interventions   Nutrition Discussed/Reviewed Portion sizes, Nutrition Reviewed, Increasing proteins,  Fluid intake  Pharmacy Interventions   Pharmacy Dicussed/Reviewed Pharmacy Topics Reviewed, Affording Medications              SDOH assessments and interventions completed:  No     Care Coordination Interventions:  Yes, provided   Follow up plan: Follow up call scheduled for 06/27/23    Encounter Outcome:  Patient Visit Completed   Cala Bradford L. Noelle Penner, RN, BSN, [facility]  VBCI Care Management Coordinator  [phone]  Fax: [phone]

## 2023-06-02 NOTE — Patient Instructions (Signed)
Visit Information  Thank you for taking time to visit with me today. Please don't hesitate to contact me if I can be of assistance to you.   Following are the goals we discussed today:   Goals Addressed             This Visit's Progress    Education and management of health conditions - care coordination services       Care Coordination Interventions: Interventions Today    Flowsheet Row Most Recent Value  Chronic Disease   Chronic disease during today's visit Other  [increased weight, dental & eye providers, leg swelling, literacy of a neighbor]  General Interventions   General Interventions Discussed/Reviewed General Interventions Reviewed, Doctor Visits, Health Screening  Doctor Visits Discussed/Reviewed Doctor Visits Reviewed, PCP  Health Screening --  [Provided her with oral surgeon MDs Ocie Doyne and Nilda Simmer in [place]  that are in network per hr national  [employer] care [facility]) online site]  PCP/Specialist Visits Compliance with follow-up visit  Exercise Interventions   Exercise Discussed/Reviewed Exercise Reviewed, Weight Managment  Weight Management Weight loss  Education Interventions   Education Provided Provided Education  [employer], keto bread. literacy vs othe rmental disorders, Elder resources for her neighbor]  Provided Verbal Education On [facility], Eye Care, Other  [dental & eye care]  Mental Health Interventions   Mental Health Discussed/Reviewed Mental Health Reviewed, Other  Nutrition Interventions   Nutrition Discussed/Reviewed Portion sizes, Nutrition Reviewed, Increasing proteins, Fluid intake  Pharmacy Interventions   Pharmacy Dicussed/Reviewed Pharmacy Topics Reviewed, Affording Medications              Our next appointment is by telephone on 06/27/23 at 1130  Please call the care guide team at [phone] if you need to cancel or reschedule your appointment.   If you are experiencing a Mental Health or  Behavioral Health Crisis or need someone to talk to, please call the Suicide and Crisis Lifeline: 988 call the [place] National Suicide Prevention Lifeline: (954) 071-8884 or TTY: 308-137-9480 TTY 2245824061) to talk to a trained counselor call 1-800-273-TALK (toll free, 24 hour hotline) call the [facility]: [phone] call 911   Patient verbalizes understanding of instructions and care plan provided today and agrees to view in MyChart. Active MyChart status and patient understanding of how to access instructions and care plan via MyChart confirmed with patient.     The patient has been provided with contact information for the care management team and has been advised to call with any health related questions or concerns.   Akia Desroches L. Noelle Penner, RN, BSN, [facility]  VBCI Care Management Coordinator  [phone]  Fax: [phone]

## 2023-06-10 ENCOUNTER — Encounter: Payer: Self-pay | Admitting: Nutrition

## 2023-06-10 ENCOUNTER — Encounter: Payer: 59 | Attending: Family Medicine | Admitting: Nutrition

## 2023-06-10 DIAGNOSIS — E559 Vitamin D deficiency, unspecified: Secondary | ICD-10-CM

## 2023-06-10 DIAGNOSIS — Z713 Dietary counseling and surveillance: Secondary | ICD-10-CM | POA: Insufficient documentation

## 2023-06-10 DIAGNOSIS — I10 Essential (primary) hypertension: Secondary | ICD-10-CM

## 2023-06-10 DIAGNOSIS — R635 Abnormal weight gain: Secondary | ICD-10-CM | POA: Insufficient documentation

## 2023-06-10 DIAGNOSIS — R7303 Prediabetes: Secondary | ICD-10-CM

## 2023-06-10 NOTE — Progress Notes (Signed)
Medical Nutrition Therapy  Appointment Start time:  0800  Appointment End time:  0900  Primary concerns today: Unexplained weight gain, Pre Dm, Obesity  Referral diagnosis: R63.4, R74.03, E66.9 Preferred learning style: Listen and hear, see  Learning readiness: Ready   NUTRITION ASSESSMENT  57 yr old wfemale referred for unexplained weight gain. Has Pre DM, Obesity, HTN and h/o of Stg IIb Hodgkin's Lymphoma. 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 Cares for her sister who has a history of alcohol abuse but is in recovery. Has a lot of personal stress. 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 She is interested in seeing a therapist to help with all her stress and childhood issues.  She is willing to work on Lifestyle Medicine. Clinical Medical Hx:  Past Medical History:  Diagnosis Date   Asthma    Cancer (HCC)    Complication of anesthesia    difficult to wake up after anesthesia   GERD (gastroesophageal reflux disease)    Hypertension     Medications: Current Outpatient Medications on File Prior to Visit  Medication Sig Dispense Refill   albuterol (PROVENTIL) (2.5 MG/3ML) 0.083% nebulizer solution USE 1 VIAL IN NEBULIZER THREE TIMES DAILY AS NEEDED     albuterol (VENTOLIN HFA) 108 (90 Base) MCG/ACT inhaler Inhale 1-2 puffs into the lungs every 6 (six) hours as needed for wheezing or shortness of breath.     albuterol (VENTOLIN HFA) 108 (90 Base) MCG/ACT inhaler Inhale 2 puffs into the lungs every 4 (four) hours as needed.     amLODipine (NORVASC) 5 MG tablet Take 1 tablet every day by oral route for 90 days.     Borage, Borago officinalis, (BORAGE OIL PO) Take 1 capsule by mouth daily.     Cholecalciferol (VITAMIN D) 50 MCG (2000 UT) CAPS Take 4,000 Units by mouth daily.     clobetasol ointment (TEMOVATE) 0.05 % APPLY A THIN LAYER TO THE AFEFCTED AREA TWICE DAILY     famotidine (PEPCID) 20 MG tablet Take 1 tablet by mouth  daily.     Flax Oil-Fish Oil-Borage Oil (FISH OIL-FLAX OIL-BORAGE OIL PO) Take by mouth.     halobetasol (ULTRAVATE) 0.05 % cream APPLY CREAM TOPICALLY TO HANDS TWICE DAILY AS NEEDED. (NOT TO FACE, GROIN OR UNDERARMS)     triamcinolone cream (KENALOG) 0.1 % APPLY A THIN LAYER TO THE AFFECTED AREA(S) BY TOPICAL ROUTE 2 TIMES PER DAY     No current facility-administered medications on file prior to visit.     Labs:     Latest Ref Rng & Units 04/09/2023    9:01 AM 04/08/2022    8:48 AM 10/12/2021    1:29 PM  CMP  Glucose 70 - 99 mg/dL 161  096  045   BUN 6 - 20 mg/dL 11  12  13    Creatinine 0.44 - 1.00 mg/dL 4.09  8.11  9.14   Sodium 135 - 145 mmol/L 136  136  138   Potassium 3.5 - 5.1 mmol/L 4.0  3.9  3.9   Chloride 98 - 111 mmol/L 105  104  107   CO2 22 - 32 mmol/L 22  25  25    Calcium 8.9 - 10.3 mg/dL 9.2  9.4  8.9   Total Protein 6.5 - 8.1 g/dL 7.9  8.1    Total Bilirubin 0.3 - 1.2 mg/dL 0.6  0.5    Alkaline Phos 38 -  126 U/L 56  51    AST 15 - 41 U/L 36  28    ALT 0 - 44 U/L 29  25      Notable Signs/Symptoms: None  Lifestyle & Dietary Hx LIves by herself. Her sister lives with her. Usually eats 1 meal per day  Estimated daily fluid intake: 40 oz Supplements:  Sleep: poor Stress / self-care: a lot of stress Current average weekly physical activity: Not exercising but active in cleaning house   24-Hr Dietary Recall Eats 1-2 meals per day Has been eating higher processed foods   Estimated Energy Needs Calories: 1200 Carbohydrate: 135g Protein: 90g Fat: 33g   NUTRITION DIAGNOSIS  NI-1.7 Predicted excessive energy intake As related to inconsistent food intake from processed foods/high calorie diet.  As evidenced by 25 lb weight gain and BMI>30.   NUTRITION INTERVENTION  Nutrition education (E-1) on the following topics:  Nutrition and Pre- Diabetes education provided on My Plate, CHO counting, meal planning, portion sizes, timing of meals, avoiding snacks  between meals unless having a low blood sugar, target ranges for A1C and blood sugars, signs/symptoms and treatment of hyper/hypoglycemia, monitoring blood sugars, taking medications as prescribed, benefits of exercising 30 minutes per day and prevention of complications of DM.  Lifestyle Medicine  - Whole Food, Plant Predominant Nutrition is highly recommended: Eat Plenty of vegetables, Mushrooms, fruits, Legumes, Whole Grains, Nuts, seeds in lieu of processed meats, processed snacks/pastries red meat, poultry, eggs.    -It is better to avoid simple carbohydrates including: Cakes, Sweet Desserts, Ice Cream, Soda (diet and regular), Sweet Tea, Candies, Chips, Cookies, Store Bought Juices, Alcohol in Excess of  1-2 drinks a day, Lemonade,  Artificial Sweeteners, Doughnuts, Coffee Creamers, "Sugar-free" Products, etc, etc.  This is not a complete list.....  Exercise: If you are able: 30 -60 minutes a day ,4 days a week, or 150 minutes a week.  The longer the better.  Combine stretch, strength, and aerobic activities.  If you were told in the past that you have high risk for cardiovascular diseases, you may seek evaluation by your heart doctor prior to initiating moderate to intense exercise programs.  Handouts Provided Include  Lifestyle Medicine handouts  Learning Style & Readiness for Change Teaching method utilized: Visual & Auditory  Demonstrated degree of understanding via: Teach Back  Barriers to learning/adherence to lifestyle change: None  Goals Established by Pt Goals Walk 30 minuets 3 times per week Eat 2 vegetables with lunch and dinner daily. Eat fruit with each meal. Avoid junk food and processed foods Drink only water   MONITORING & EVALUATION Dietary intake, weekly physical activity, and weight in 2 months.  Next Steps  Patient is to work on meal planning and meal planning.Marland Kitchen

## 2023-06-10 NOTE — Patient Instructions (Signed)
Goals Walk 30 minuets 3 times per week Eat 2 vegetables with lunch and dinner daily. Eat fruit with each meal. Avoid junk food and processed foods Drink only water

## 2023-06-16 DIAGNOSIS — D509 Iron deficiency anemia, unspecified: Secondary | ICD-10-CM | POA: Diagnosis not present

## 2023-06-16 DIAGNOSIS — R7303 Prediabetes: Secondary | ICD-10-CM | POA: Diagnosis not present

## 2023-06-16 DIAGNOSIS — E559 Vitamin D deficiency, unspecified: Secondary | ICD-10-CM | POA: Diagnosis not present

## 2023-06-16 DIAGNOSIS — I10 Essential (primary) hypertension: Secondary | ICD-10-CM | POA: Diagnosis not present

## 2023-06-22 DIAGNOSIS — N644 Mastodynia: Secondary | ICD-10-CM | POA: Insufficient documentation

## 2023-06-23 ENCOUNTER — Other Ambulatory Visit (HOSPITAL_COMMUNITY): Payer: Self-pay | Admitting: Family Medicine

## 2023-06-23 DIAGNOSIS — E785 Hyperlipidemia, unspecified: Secondary | ICD-10-CM | POA: Diagnosis not present

## 2023-06-23 DIAGNOSIS — I10 Essential (primary) hypertension: Secondary | ICD-10-CM | POA: Diagnosis not present

## 2023-06-23 DIAGNOSIS — Z8571 Personal history of Hodgkin lymphoma: Secondary | ICD-10-CM | POA: Diagnosis not present

## 2023-06-23 DIAGNOSIS — Z79899 Other long term (current) drug therapy: Secondary | ICD-10-CM | POA: Diagnosis not present

## 2023-06-23 DIAGNOSIS — Z87891 Personal history of nicotine dependence: Secondary | ICD-10-CM | POA: Diagnosis not present

## 2023-06-23 DIAGNOSIS — M8580 Other specified disorders of bone density and structure, unspecified site: Secondary | ICD-10-CM | POA: Diagnosis not present

## 2023-06-23 DIAGNOSIS — L309 Dermatitis, unspecified: Secondary | ICD-10-CM | POA: Diagnosis not present

## 2023-06-23 DIAGNOSIS — E559 Vitamin D deficiency, unspecified: Secondary | ICD-10-CM | POA: Diagnosis not present

## 2023-06-23 DIAGNOSIS — M545 Low back pain, unspecified: Secondary | ICD-10-CM | POA: Diagnosis not present

## 2023-06-23 DIAGNOSIS — R7303 Prediabetes: Secondary | ICD-10-CM | POA: Diagnosis not present

## 2023-06-27 ENCOUNTER — Ambulatory Visit: Payer: Self-pay | Admitting: *Deleted

## 2023-06-27 NOTE — Patient Outreach (Signed)
  Care Coordination   06/27/2023 Name: Beth Barber MRN: [id number] DOB: 1966-03-29   Care Coordination Outreach Attempts:  An unsuccessful telephone outreach was attempted today to offer the patient information about available care coordination services.  Follow Up Plan:  Additional outreach attempts will be made to offer the patient care coordination information and services.   Encounter Outcome:  No Answer   Care Coordination Interventions:  No, not indicated    Glendon Dunwoody L. Noelle Penner, RN, BSN, [facility]  VBCI Care Management Coordinator  [phone]  Fax: [phone]

## 2023-07-08 ENCOUNTER — Ambulatory Visit: Payer: Self-pay | Admitting: Licensed Clinical Social Worker

## 2023-07-08 NOTE — Patient Instructions (Signed)
Visit Information  Thank you for taking time to visit with me today. Please don't hesitate to contact me if I can be of assistance to you.   Following are the goals we discussed today:   Goals Addressed               This Visit's Progress     Patient Stated she is concerned about housing issues. She is looking for new apartment (pt-stated)        Interventions:  Spoke with client via phone today about client  current status and health needs Spoke with client about housing needs. She said she is still looking for a new apartment Spoke about needs of client's sister. Client's sister resides with client. Client provides support for her sister as needed Discussed food procurement. Client likes to cook to help her relax .Client has [employer] to help her with food needs. Client said she met with nutritionist recently and was informed about healthy eating habits. Client is trying to buy healthy foods from grocery store. She is trying to start adjusting her diet as recommended by nutritionist. She has next appointment with nutritionist on August 05, 2023.  Discussed walking of client. She said she has some difficulty climbing stairs .She is sometimes out of breath in climbing stairs.  She has some swelling in her ankles. Discussed client edema  Discussed medication procurement Discussed transport needs. Client uses [employer] occasionally for transport or to go on daily activities Discussed mood. She feels some anxiety related to managing her health needs. She is anxious in trying to change her eating habits to adjust to a more healthy diet as recommended by nutritionist Provided counseling support Discussed pain issues. She has some knee pain issues. She said she can walk short distances Discussed PCP support with Dr. [name]. She had appointment with Dr [name] on 06/16/23. Client said she sometimes gets sleepy after eating meals Client said she is not sore from recent fall Discussed [employer]. Discussed that complex was receiving numerous applications for housing. Discussed that complex may have waiting list for housing applicants. Discussed family support. Thanked client for phone call with LCSW today Encouraged client to call LCSW as needed for SW support at [phone].            Our next appointment is by telephone on 08/12/23 at 10:00 AM   Please call the care guide team at [phone] if you need to cancel or reschedule your appointment.   If you are experiencing a Mental Health or Behavioral Health Crisis or need someone to talk to, please go to [facility] [address], [place] [phone])   The patient verbalized understanding of instructions, educational materials, and care plan provided today and DECLINED offer to receive copy of patient instructions, educational materials, and care plan.   The patient has been provided with contact information for the care management team and has been advised to call with any health related questions or concerns.   Beth Barber MSW, LCSW Licensed Visual merchandiser [facility] Care Management [phone]

## 2023-07-08 NOTE — Patient Outreach (Signed)
Care Coordination   Follow Up Visit Note   07/08/2023 Name: Tee Neve MRN: [id number] DOB: 1966/07/26  Ruthelma Besch is a 57 y.o. year old female who sees [name], [name], MD for primary care. I spoke with  [name] by phone today.  What matters to the patients health and wellness today?  Patient Stated she is concerned about housing issues. She is looking for new apartment      Goals Addressed               This Visit's Progress     Patient Stated she is concerned about housing issues. She is looking for new apartment (pt-stated)        Interventions:  Spoke with client via phone today about client  current status and health needs Spoke with client about housing needs. She said she is still looking for a new apartment Spoke about needs of client's sister. Client's sister resides with client. Client provides support for her sister as needed Discussed food procurement. Client likes to cook to help her relax .Client has [employer] to help her with food needs. Client said she met with nutritionist recently and was informed about healthy eating habits. Client is trying to buy healthy foods from grocery store. She is trying to start adjusting her diet as recommended by nutritionist. She has next appointment with nutritionist on August 05, 2023.  Discussed walking of client. She said she has some difficulty climbing stairs .She is sometimes out of breath in climbing stairs.  She has some swelling in her ankles. Discussed client edema  Discussed medication procurement Discussed transport needs. Client uses [employer] occasionally for transport or to go on daily activities Discussed mood. She feels some anxiety related to managing her health needs. She is anxious in trying to change her eating habits to adjust to a more healthy diet as recommended by nutritionist Provided counseling support Discussed pain issues. She has some knee pain issues. She said she can walk short distances Discussed PCP  support with Dr. [name]. She had appointment with Dr [name] on 06/16/23. Client said she sometimes gets sleepy after eating meals Client said she is not sore from recent fall Discussed [employer]. Discussed that complex was receiving numerous applications for housing. Discussed that complex may have waiting list for housing applicants. Discussed family support. Thanked client for phone call with LCSW today Encouraged client to call LCSW as needed for SW support at [phone].            SDOH assessments and interventions completed:  Yes  SDOH Interventions Today    Flowsheet Row Most Recent Value  SDOH Interventions   Depression Interventions/Treatment  Counseling  Physical Activity Interventions Other (Comments)  [decreased activities]  Stress Interventions Provide Counseling  [client has stress in managing diet,  client has stress in managing medical needs]        Care Coordination Interventions:  Yes, provided  Interventions Today    Flowsheet Row Most Recent Value  Chronic Disease   Chronic disease during today's visit Other  [spoke with client about client needs]  General Interventions   General Interventions Discussed/Reviewed General Interventions Discussed, Community Resources  Exercise Interventions   Exercise Discussed/Reviewed Physical Activity, Weight Managment  Education Interventions   Education Provided Provided Education  Provided Verbal Education On Community Resources  Mental Health Interventions   Mental Health Discussed/Reviewed Coping Strategies  [trying to use coping skills to manage anxiety issues. is trying to eat a more  healthy diet]  Nutrition Interventions   Nutrition Discussed/Reviewed Nutrition Discussed  Pharmacy Interventions   Pharmacy Dicussed/Reviewed Pharmacy Topics Discussed        Follow up plan: Follow up call scheduled for 08/12/23 at 10:00 AM    Encounter Outcome:  Patient Visit Completed   [name].Blossie Raffel  MSW, LCSW Licensed Visual merchandiser [facility] Care Management [phone]

## 2023-07-21 ENCOUNTER — Inpatient Hospital Stay (HOSPITAL_COMMUNITY): Admission: RE | Admit: 2023-07-21 | Payer: 59 | Source: Ambulatory Visit

## 2023-08-05 ENCOUNTER — Encounter: Payer: Self-pay | Admitting: Nutrition

## 2023-08-05 ENCOUNTER — Encounter: Payer: 59 | Attending: Family Medicine | Admitting: Nutrition

## 2023-08-05 VITALS — Ht 66.0 in | Wt 227.0 lb

## 2023-08-05 DIAGNOSIS — R635 Abnormal weight gain: Secondary | ICD-10-CM | POA: Insufficient documentation

## 2023-08-05 DIAGNOSIS — I10 Essential (primary) hypertension: Secondary | ICD-10-CM | POA: Insufficient documentation

## 2023-08-05 DIAGNOSIS — E559 Vitamin D deficiency, unspecified: Secondary | ICD-10-CM | POA: Insufficient documentation

## 2023-08-05 DIAGNOSIS — R7303 Prediabetes: Secondary | ICD-10-CM | POA: Insufficient documentation

## 2023-08-05 NOTE — Patient Instructions (Addendum)
Goals Work on portion sizes Go to full plate living website. Keep working out 5 times per week. Lose 2 lbs per month.

## 2023-08-05 NOTE — Progress Notes (Signed)
Medical Nutrition Therapy  Appointment Start time:  0800  Appointment End time:  0900  Primary concerns today: Unexplained weight gain, Pre Dm, Obesity  Referral diagnosis: R63.4, R74.03, E66.9 Preferred learning style: Listen and hear, see  Learning readiness: Ready   NUTRITION ASSESSMENT Pre Dm and Obesity Follow up She notes her weight  had gone up to 231 and now down to 227 lbs.  Changes made: Goals made last visit: Walk 30 minuets 3 times per week-Working on it- doing it mostly. Eat 2 vegetables with lunch and dinner daily.-done Eat fruit with each meal.-done Avoid junk food and processed foods-working on it Drink only water-improved. She is doing fantastic. Made a lot of changes. Cooking more, meal prepping,  eating more vegetables, fruit and started exercising.  Bowels are regular. GI is much better. Sleeping better. Goes to see PCP in January she thinks. No blood work to review.    She is willing to continue to work on Lifestyle Medicine.    Clinical Medical Hx:  Past Medical History:  Diagnosis Date   Asthma    Cancer (HCC)    Complication of anesthesia    difficult to wake up after anesthesia   GERD (gastroesophageal reflux disease)    Hypertension     Medications: Current Outpatient Medications on File Prior to Visit  Medication Sig Dispense Refill   albuterol (PROVENTIL) (2.5 MG/3ML) 0.083% nebulizer solution USE 1 VIAL IN NEBULIZER THREE TIMES DAILY AS NEEDED     albuterol (VENTOLIN HFA) 108 (90 Base) MCG/ACT inhaler Inhale 1-2 puffs into the lungs every 6 (six) hours as needed for wheezing or shortness of breath.     albuterol (VENTOLIN HFA) 108 (90 Base) MCG/ACT inhaler Inhale 2 puffs into the lungs every 4 (four) hours as needed.     amLODipine (NORVASC) 5 MG tablet Take 1 tablet every day by oral route for 90 days.     Borage, Borago officinalis, (BORAGE OIL PO) Take 1 capsule by mouth daily.     Cholecalciferol (VITAMIN D) 50 MCG (2000 UT) CAPS Take  4,000 Units by mouth daily.     clobetasol ointment (TEMOVATE) 0.05 % APPLY A THIN LAYER TO THE AFEFCTED AREA TWICE DAILY     famotidine (PEPCID) 20 MG tablet Take 1 tablet by mouth daily.     Flax Oil-Fish Oil-Borage Oil (FISH OIL-FLAX OIL-BORAGE OIL PO) Take by mouth.     halobetasol (ULTRAVATE) 0.05 % cream APPLY CREAM TOPICALLY TO HANDS TWICE DAILY AS NEEDED. (NOT TO FACE, GROIN OR UNDERARMS)     triamcinolone cream (KENALOG) 0.1 % APPLY A THIN LAYER TO THE AFFECTED AREA(S) BY TOPICAL ROUTE 2 TIMES PER DAY     No current facility-administered medications on file prior to visit.     Labs:     Latest Ref Rng & Units 04/09/2023    9:01 AM 04/08/2022    8:48 AM 10/12/2021    1:29 PM  CMP  Glucose 70 - 99 mg/dL 161  096  045   BUN 6 - 20 mg/dL 11  12  13    Creatinine 0.44 - 1.00 mg/dL 4.09  8.11  9.14   Sodium 135 - 145 mmol/L 136  136  138   Potassium 3.5 - 5.1 mmol/L 4.0  3.9  3.9   Chloride 98 - 111 mmol/L 105  104  107   CO2 22 - 32 mmol/L 22  25  25    Calcium 8.9 - 10.3 mg/dL 9.2  9.4  8.9  Total Protein 6.5 - 8.1 g/dL 7.9  8.1    Total Bilirubin 0.3 - 1.2 mg/dL 0.6  0.5    Alkaline Phos 38 - 126 U/L 56  51    AST 15 - 41 U/L 36  28    ALT 0 - 44 U/L 29  25      Notable Signs/Symptoms: None  Lifestyle & Dietary Hx LIves by herself. Her sister lives with her. Usually eats 1 meal per day  Estimated daily fluid intake: 40 oz Supplements:  Sleep: poor Stress / self-care: a lot of stress Current average weekly physical activity: Not exercising but active in cleaning house   24-Hr Dietary Recall Eats 1-2 meals per day Has been eating higher processed foods   Estimated Energy Needs Calories: 1200 Carbohydrate: 135g Protein: 90g Fat: 33g   NUTRITION DIAGNOSIS  NI-1.7 Predicted excessive energy intake As related to inconsistent food intake from processed foods/high calorie diet.  As evidenced by 25 lb weight gain and BMI>30.   NUTRITION INTERVENTION  Nutrition  education (E-1) on the following topics:  Nutrition and Pre- Diabetes education provided on My Plate, CHO counting, meal planning, portion sizes, timing of meals, avoiding snacks between meals unless having a low blood sugar, target ranges for A1C and blood sugars, signs/symptoms and treatment of hyper/hypoglycemia, monitoring blood sugars, taking medications as prescribed, benefits of exercising 30 minutes per day and prevention of complications of DM.  Lifestyle Medicine  - Whole Food, Plant Predominant Nutrition is highly recommended: Eat Plenty of vegetables, Mushrooms, fruits, Legumes, Whole Grains, Nuts, seeds in lieu of processed meats, processed snacks/pastries red meat, poultry, eggs.    -It is better to avoid simple carbohydrates including: Cakes, Sweet Desserts, Ice Cream, Soda (diet and regular), Sweet Tea, Candies, Chips, Cookies, Store Bought Juices, Alcohol in Excess of  1-2 drinks a day, Lemonade,  Artificial Sweeteners, Doughnuts, Coffee Creamers, "Sugar-free" Products, etc, etc.  This is not a complete list.....  Exercise: If you are able: 30 -60 minutes a day ,4 days a week, or 150 minutes a week.  The longer the better.  Combine stretch, strength, and aerobic activities.  If you were told in the past that you have high risk for cardiovascular diseases, you may seek evaluation by your heart doctor prior to initiating moderate to intense exercise programs.  Handouts Provided Include  Lifestyle Medicine handouts  Learning Style & Readiness for Change Teaching method utilized: Visual & Auditory  Demonstrated degree of understanding via: Teach Back  Barriers to learning/adherence to lifestyle change: None  Goals Established by Pt Goals Work on portion sizes Go to full plate living website. Keep working out 5 times per week. Lose 2 lbs per month.  MONITORING & EVALUATION Dietary intake, weekly physical activity, and weight in 4 months.  Next Steps  Patient is to work on  meal planning and meal planning.Marland Kitchen

## 2023-08-06 ENCOUNTER — Ambulatory Visit: Payer: Self-pay | Admitting: *Deleted

## 2023-08-06 NOTE — Patient Instructions (Addendum)
Visit Information  Thank you for taking time to visit with me today. Please don't hesitate to contact me if I can be of assistance to you.   Following are the goals we discussed today:   Goals Addressed             This Visit's Progress    Education and management of health conditions - care coordination services   On track    Care Coordination Interventions: Interventions Today    Flowsheet Row Most Recent Value  Chronic Disease   Chronic disease during today's visit Other  [housing, nutrition/weight loss]  General Interventions   General Interventions Discussed/Reviewed General Interventions Reviewed, Community Resources, Doctor Visits  Doctor Visits Discussed/Reviewed Doctor Visits Reviewed, PCP, Specialist  [pcp at Dr Scharlene Gloss office Eboni will be leaving]  PCP/Specialist Visits Compliance with follow-up visit  Exercise Interventions   Exercise Discussed/Reviewed Exercise Reviewed, Physical Activity, Weight Managment  Physical Activity Discussed/Reviewed Physical Activity Reviewed, Types of exercise, Home Exercise Program (HEP)  [aerobics with her sister, walking]  Weight Management Weight loss  Education Interventions   Education Provided Provided Education, Provided Web-based Education  Provided Verbal Education On Nutrition  Mental Health Interventions   Mental Health Discussed/Reviewed Mental Health Reviewed, Coping Strategies  Nutrition Interventions   Nutrition Discussed/Reviewed Nutrition Reviewed, Adding fruits and vegetables, Portion sizes, Decreasing sugar intake, Decreasing salt  Pharmacy Interventions   Pharmacy Dicussed/Reviewed Pharmacy Topics Reviewed, Affording Medications              Our next appointment is by telephone on 09/05/23 at 1 pm  Please call the care guide team at [phone] if you need to cancel or reschedule your appointment.   If you are experiencing a Mental Health or Behavioral Health Crisis or need someone to talk to, please call  the Suicide and Crisis Lifeline: 988 call the [place] National Suicide Prevention Lifeline: 787-040-0979 or TTY: 262-776-7533 TTY (365) 088-8009) to talk to a trained counselor call 1-800-273-TALK (toll free, 24 hour hotline) call the [facility]: 639-388-4019 call 911   Patient verbalizes understanding of instructions and care plan provided today and agrees to view in MyChart. Active MyChart status and patient understanding of how to access instructions and care plan via MyChart confirmed with patient.     The patient has been provided with contact information for the care management team and has been advised to call with any health related questions or concerns.   Aran Menning L. Noelle Penner, RN, BSN, [facility]  VBCI Care Management Coordinator  [phone]  Fax: [phone]

## 2023-08-06 NOTE — Patient Outreach (Signed)
  Care Coordination   Follow Up Visit Note   08/06/2023 Name: Beth Barber MRN: [id number] DOB: 1966-08-03  Beth Barber is a 57 y.o. year old female who sees [name], [name], MD for primary care. I spoke with  [name] by phone today.  What matters to the patients health and wellness today?  Housing, Nutrition  Housing - does not want to move to the lower floor for security reasons. Still working with on new housing with [employer], [address]. Working with [employer] to get a voucher.  Nutrition- is seeing nutritionist, P Crumpton She has lost a few pounds. She reports she is eating better, eating less junk food. Saw nutritionist on 08/05/23. Doing more aerobics with her sister + walking. Her goals of making better food choices & manage portion sizes. Likes squash, zucchini, ? Chard/sorrel (green leaf vegetable with red veins)     Goals Addressed             This Visit's Progress    Education and management of health conditions - care coordination services   On track    Care Coordination Interventions: Interventions Today    Flowsheet Row Most Recent Value  Chronic Disease   Chronic disease during today's visit Other  [housing, nutrition/weight loss]  General Interventions   General Interventions Discussed/Reviewed General Interventions Reviewed, Community Resources, Doctor Visits  Doctor Visits Discussed/Reviewed Doctor Visits Reviewed, PCP, Specialist  [pcp at Dr Scharlene Gloss office Eboni will be leaving]  PCP/Specialist Visits Compliance with follow-up visit  Exercise Interventions   Exercise Discussed/Reviewed Exercise Reviewed, Physical Activity, Weight Managment  Physical Activity Discussed/Reviewed Physical Activity Reviewed, Types of exercise, Home Exercise Program (HEP)  [aerobics with her sister, walking]  Weight Management Weight loss  Education Interventions   Education Provided Provided Education, Provided Web-based Education  Provided Verbal Education On  Nutrition  Mental Health Interventions   Mental Health Discussed/Reviewed Mental Health Reviewed, Coping Strategies  Nutrition Interventions   Nutrition Discussed/Reviewed Nutrition Reviewed, Adding fruits and vegetables, Portion sizes, Decreasing sugar intake, Decreasing salt  Pharmacy Interventions   Pharmacy Dicussed/Reviewed Pharmacy Topics Reviewed, Affording Medications              SDOH assessments and interventions completed:  No     Care Coordination Interventions:  No, not indicated   Follow up plan: Follow up call scheduled for 09/05/23 1 pm     Encounter Outcome:  Patient Visit Completed   Deaunna Olarte L. Noelle Penner, RN, BSN, [facility]  VBCI Care Management Coordinator  [phone]  Fax: [phone]

## 2023-08-12 ENCOUNTER — Ambulatory Visit: Payer: Self-pay | Admitting: Licensed Clinical Social Worker

## 2023-08-12 NOTE — Patient Outreach (Signed)
Care Coordination   Follow Up Visit Note   08/12/2023 Name: Beth Barber MRN: [id number] DOB: [birth year]  Beth Barber is a 57 y.o. year old female who sees [name], [name], MD for primary care. I spoke with  Beth Barber by phone today.  What matters to the patients health and wellness today?  Patient Stated she is concerned about housing issues. She is looking for new apartment (pt-stated)     Goals Addressed               This Visit's Progress     Patient Stated she is concerned about housing issues. She is looking for new apartment (pt-stated)        Interventions:  Spoke with client via phone today about client  current status and health needs Spoke with client about housing needs. She said she is still looking for a new apartment. She has to climb some steps to her apartment at present. She is walking for exercise. She hopes that regular walking exercises will help her with climbing stairs. Spoke about needs of client's sister. Client's sister resides with client. Client provides support for her sister as needed. Beth Barber said she and her sister enjoy cooking as form of relaxation.  Beth Barber has met with Beth Barber, Nutritionist. Beth Barber said that Beth Barber has been very helpful to Beth Barber on what types of food to buy and on healthy eating habits for client Client is trying to cook healthy meals, eat fruits and vegetables. Using portion control method. Client has [employer] to use for food procurement and use of U card is helpful to client Client said that edema issues have been improving Client said she has occasional knee pain issues Client said she uses [employer] as needed to go to and from client appointments Discussed medical support at PCP office. (Office of Dr. Fidel Levy) Discussed mood of client. She feels that her mood is stable at present Congratulated client on her positive changes in working with nutritionist and in healthy eating. Client said she feels good about her progress in  area of healthy eating Reminded client that she can call LCSW for SW support at [phone]. Thanked client for phone call with LCSW today Client was appreciative of call from LCSW today          SDOH assessments and interventions completed:  Yes  SDOH Interventions Today    Flowsheet Row Most Recent Value  SDOH Interventions   Depression Interventions/Treatment  Counseling  Physical Activity Interventions Other (Comments)  [client tries to walk for exercise. she does exercise in her home as able]  Stress Interventions Provide Counseling  [stress in managing medical needs]        Care Coordination Interventions:  Yes, provided   Interventions Today    Flowsheet Row Most Recent Value  Chronic Disease   Chronic disease during today's visit Other  [spoke with client about client needs]  General Interventions   General Interventions Discussed/Reviewed General Interventions Discussed, Community Resources  Exercise Interventions   Exercise Discussed/Reviewed Exercise Reviewed, Physical Activity  [she likes to go walking for exercise]  Physical Activity Discussed/Reviewed Physical Activity Discussed  Education Interventions   Provided Verbal Education On Nutrition  [client has met with nutritionist for dietary support and dietary education]  Mental Health Interventions   Mental Health Discussed/Reviewed Mental Health Discussed, Coping Strategies  [no mood issues noted]  Nutrition Interventions   Nutrition Discussed/Reviewed Nutrition Discussed  Pharmacy Interventions   Pharmacy Dicussed/Reviewed Pharmacy Topics Discussed  Safety Interventions   Safety Discussed/Reviewed Fall Risk        Follow up plan: Follow up call scheduled for 09/15/23 at 9:00 AM    Encounter Outcome:  Patient Visit Completed   [name].Porter Nakama MSW, LCSW Licensed Visual merchandiser [facility] Care Management [phone]

## 2023-08-12 NOTE — Patient Instructions (Signed)
Visit Information  Thank you for taking time to visit with me today. Please don't hesitate to contact me if I can be of assistance to you.   Following are the goals we discussed today:   Goals Addressed               This Visit's Progress     Patient Stated she is concerned about housing issues. She is looking for new apartment (pt-stated)        Interventions:  Spoke with client via phone today about client  current status and health needs Spoke with client about housing needs. She said she is still looking for a new apartment. She has to climb some steps to her apartment at present. She is walking for exercise. She hopes that regular walking exercises will help her with climbing stairs. Spoke about needs of client's sister. Client's sister resides with client. Client provides support for her sister as needed. Ahria said she and her sister enjoy cooking as form of relaxation.  Amran has met with Norm Salt, Nutritionist. [place]kale said that Boyd Kerbs has been very helpful to Sharmeka on what types of food to buy and on healthy eating habits for client Client is trying to cook healthy meals, eat fruits and vegetables. Using portion control method. Client has [employer] to use for food procurement and use of U card is helpful to client Client said that edema issues have been improving Client said she has occasional knee pain issues Client said she uses [employer] as needed to go to and from client appointments Discussed medical support at PCP office. (Office of Dr. Fidel Levy) Discussed mood of client. She feels that her mood is stable at present Congratulated client on her positive changes in working with nutritionist and in healthy eating. Client said she feels good about her progress in area of healthy eating Reminded client that she can call LCSW for SW support at [phone]. Thanked client for phone call with LCSW today Client was appreciative of call from LCSW today          Our  next appointment is by telephone on 09/15/23 at 9:00 AM   Please call the care guide team at [phone] if you need to cancel or reschedule your appointment.   If you are experiencing a Mental Health or Behavioral Health Crisis or need someone to talk to, please go to [facility] [address] [place] [phone])   The patient verbalized understanding of instructions, educational materials, and care plan provided today and DECLINED offer to receive copy of patient instructions, educational materials, and care plan.   The patient has been provided with contact information for the care management team and has been advised to call with any health related questions or concerns.   [name].Melvina Pangelinan MSW, LCSW Licensed Visual merchandiser [facility] Care Management [phone]

## 2023-09-05 ENCOUNTER — Ambulatory Visit: Payer: Self-pay | Admitting: *Deleted

## 2023-09-05 NOTE — Patient Outreach (Signed)
  Care Coordination   Follow Up Visit Note   11/18/2023 updated note for 09/05/23 Name: Beth Barber MRN: [id number] DOB: 1966/05/31  Beth Barber is a 58 y.o. year old female who sees [name], [name], MD for primary care. I spoke with  [name] by phone today.  What matters to the patients health and wellness today?  Weight management,  dentures  Weight-she reports she has started juicing daily then do her exercising.  Now air frying, using less oil. [place], chicken, fish (salmon) Lost 5 pounds. Was 231 lbs now down to 226 Goal weight Walks daily  Tried to  upload a full plate living app to help with getting recipes recommended by her dietitian but was unable to  Voiced appreciation after RN CM assisted her to get app uploaded Continues to work with endocrinologist's dietitian  She has her glasses Will be soon getting her dentures   Goals Addressed             This Visit's Progress    Education and management of health conditions - care coordination services       Care Coordination Interventions: Interventions Today    Flowsheet Row Most Recent Value  Chronic Disease   Chronic disease during today's visit Diabetes, Other  General Interventions   General Interventions Discussed/Reviewed General Interventions Reviewed, Doctor Visits, [facility], Annual Eye Exam  Larose Kells glasses, dentures]  Doctor Visits Discussed/Reviewed Doctor Visits Reviewed, PCP, Specialist  PCP/Specialist Visits Compliance with follow-up visit  Exercise Interventions   Exercise Discussed/Reviewed Exercise Reviewed, Physical Activity, Weight Managment  Physical Activity Discussed/Reviewed Physical Activity Reviewed  Weight Management Weight maintenance  Education Interventions   Education Provided Provided Education  [assisted to upload her full plate living app]  Provided Verbal Education On Community Resources  Mental Health Interventions   Mental Health Discussed/Reviewed Mental Health Reviewed,  Coping Strategies  Nutrition Interventions   Nutrition Discussed/Reviewed Nutrition Reviewed, Adding fruits and vegetables, Fluid intake, Portion sizes, Increasing proteins  Pharmacy Interventions   Pharmacy Dicussed/Reviewed Pharmacy Topics Reviewed, Affording Medications              SDOH assessments and interventions completed:  No     Care Coordination Interventions:  Yes, provided   Follow up plan: Follow up call scheduled for 12/04/23    Encounter Outcome:  Patient Visit Completed   Cala Bradford L. Noelle Penner, RN, BSN, CCM [place]  [facility], [facility] RN Care Manager Direct Dial: [phone]  Fax: [phone] Mailing Address: [address]  [place] [address] Website: [email or web address]

## 2023-09-15 ENCOUNTER — Ambulatory Visit: Payer: Self-pay | Admitting: Licensed Clinical Social Worker

## 2023-09-15 NOTE — Patient Outreach (Signed)
Care Coordination   Follow Up Visit Note   09/15/2023 Name: Krystian Ferrentino MRN: [id number] DOB: [birth year]  Chayanne Filippi is a 58 y.o. year old female who sees [name], [name], MD for primary care. I spoke with  [name] by phone today.  What matters to the patients health and wellness today?  Patient Stated she is concerned about housing issues. She is looking for new apartment (pt-stated)    Goals Addressed               This Visit's Progress     Patient Stated she is concerned about housing issues. She is looking for new apartment (pt-stated)        Interventions:  Spoke with client via phone today about client  current status and health needs Spoke with client about housing needs. She said she is still looking for a new apartment. She has to climb some steps to her apartment at present. She is walking for exercise. She hopes that regular walking exercises will help her with climbing stairs. Spoke about needs of client's sister. Client's sister resides with client. Client provides support for her sister as needed. Anglea said she and her sister enjoy cooking as form of relaxation.  Client is trying to cook healthy meals, eat fruits and vegetables. Using portion control method. Client has U Card to use for food procurement and use of U card is helpful to client Client said that edema issues have been improving Client said she has occasional knee pain issues Client said she uses [employer] bus as needed to go to and from client appointments Discussed medical support at PCP office. (Office of Dr. Fidel Levy). Client said she actually lives close to PCP office and can actually walk to PCP office. This is very helpful to client in receiving care Discussed mood of client. She feels that her mood is stable at present Provided counseling support Spoke with client about program support with RN, LCSW, Pharmacist Reminded client that she can call LCSW for SW support at [phone]. Thanked client  for phone call with LCSW today Client was appreciative of call from LCSW today          SDOH assessments and interventions completed:  Yes  SDOH Interventions Today    Flowsheet Row Most Recent Value  SDOH Interventions   Depression Interventions/Treatment  Counseling  Physical Activity Interventions Other (Comments)  [gets fatigued occasionally,  has to take periodic rest breaks]  Stress Interventions Provide Counseling  [has stress in managing medical needs]        Care Coordination Interventions:  Yes, provided    Interventions Today    Flowsheet Row Most Recent Value  Chronic Disease   Chronic disease during today's visit Other  [spoke with client about client needs]  General Interventions   General Interventions Discussed/Reviewed General Interventions Discussed, Community Resources  Education Interventions   Education Provided Provided Education  Provided Verbal Education On [facility]  Mental Health Interventions   Mental Health Discussed/Reviewed Coping Strategies  [client is trying to manage medical needs. Faces some stress issues]  Pharmacy Interventions   Pharmacy Dicussed/Reviewed Pharmacy Topics Discussed  Safety Interventions   Safety Discussed/Reviewed Fall Risk       Follow up plan: Follow up call scheduled for 11/12/23 at 9:30 AM     Encounter Outcome:  Patient Visit Completed    Lorna Few  MSW, LCSW [place] Licensed Clinical Social Worker Direct Dial:  [phone]  Fax:  [phone] Website:  [email or web address]

## 2023-09-15 NOTE — Patient Instructions (Signed)
Visit Information  Thank you for taking time to visit with me today. Please don't hesitate to contact me if I can be of assistance to you.   Following are the goals we discussed today:   Goals Addressed               This Visit's Progress     Patient Stated she is concerned about housing issues. She is looking for new apartment (pt-stated)        Interventions:  Spoke with client via phone today about client  current status and health needs Spoke with client about housing needs. She said she is still looking for a new apartment. She has to climb some steps to her apartment at present. She is walking for exercise. She hopes that regular walking exercises will help her with climbing stairs. Spoke about needs of client's sister. Client's sister resides with client. Client provides support for her sister as needed. Gradie said she and her sister enjoy cooking as form of relaxation.  Client is trying to cook healthy meals, eat fruits and vegetables. Using portion control method. Client has U Card to use for food procurement and use of U card is helpful to client Client said that edema issues have been improving Client said she has occasional knee pain issues Client said she uses [employer] bus as needed to go to and from client appointments Discussed medical support at PCP office. (Office of Dr. Fidel Levy). Client said she actually lives close to PCP office and can actually walk to PCP office. This is very helpful to client in receiving care Discussed mood of client. She feels that her mood is stable at present Provided counseling support Spoke with client about program support with RN, LCSW, Pharmacist Reminded client that she can call LCSW for SW support at [phone]. Thanked client for phone call with LCSW today Client was appreciative of call from LCSW today          Our next appointment is by telephone on 11/12/23 at 9:30 AM  Please call the care guide team at [phone] if  you need to cancel or reschedule your appointment.   If you are experiencing a Mental Health or Behavioral Health Crisis or need someone to talk to, please go to [facility] [address] [place] [phone])   The patient verbalized understanding of instructions, educational materials, and care plan provided today and DECLINED offer to receive copy of patient instructions, educational materials, and care plan.   The patient has been provided with contact information for the care management team and has been advised to call with any health related questions or concerns.    Lorna Few  MSW, LCSW [place] Licensed Clinical Social Worker Direct Dial:  [phone] Fax:  [phone] Website:  [email or web address]

## 2023-09-18 DIAGNOSIS — E559 Vitamin D deficiency, unspecified: Secondary | ICD-10-CM | POA: Diagnosis not present

## 2023-09-18 DIAGNOSIS — R7303 Prediabetes: Secondary | ICD-10-CM | POA: Diagnosis not present

## 2023-09-18 DIAGNOSIS — E785 Hyperlipidemia, unspecified: Secondary | ICD-10-CM | POA: Diagnosis not present

## 2023-09-26 DIAGNOSIS — E559 Vitamin D deficiency, unspecified: Secondary | ICD-10-CM | POA: Diagnosis not present

## 2023-09-26 DIAGNOSIS — L309 Dermatitis, unspecified: Secondary | ICD-10-CM | POA: Diagnosis not present

## 2023-09-26 DIAGNOSIS — R7303 Prediabetes: Secondary | ICD-10-CM | POA: Diagnosis not present

## 2023-09-26 DIAGNOSIS — I10 Essential (primary) hypertension: Secondary | ICD-10-CM | POA: Diagnosis not present

## 2023-09-26 DIAGNOSIS — Z8571 Personal history of Hodgkin lymphoma: Secondary | ICD-10-CM | POA: Diagnosis not present

## 2023-09-26 DIAGNOSIS — M545 Low back pain, unspecified: Secondary | ICD-10-CM | POA: Diagnosis not present

## 2023-09-26 DIAGNOSIS — E785 Hyperlipidemia, unspecified: Secondary | ICD-10-CM | POA: Diagnosis not present

## 2023-09-26 DIAGNOSIS — M8580 Other specified disorders of bone density and structure, unspecified site: Secondary | ICD-10-CM | POA: Diagnosis not present

## 2023-11-12 ENCOUNTER — Ambulatory Visit: Payer: Self-pay | Admitting: Licensed Clinical Social Worker

## 2023-11-12 NOTE — Patient Outreach (Signed)
 Care Coordination   Follow Up Visit Note   11/12/2023 Name: Beth Barber MRN: [id number] DOB: Mar 26, 1966  Beth Barber is a 58 y.o. year old female who sees [name], [name], MD for primary care. I spoke with  Beth Barber by phone today.  What matters to the patients health and wellness today?  Patient Stated she is concerned about housing issues. She is looking for new apartment      Goals Addressed               This Visit's Progress     Patient Stated she is concerned about housing issues. She is looking for new apartment (pt-stated)        Interventions:  Spoke with client via phone today about client  current status and health needs Spoke with client about housing needs. She said she is still looking for a new apartment. She spoke of apartment complex being built in area. Client has to climb some steps to her apartment at present. She is walking for exercise. She hopes that regular walking exercises will help her with climbing stairs. Spoke about needs of client's sister. Client's sister resides with client. Client provides support for her sister as needed. Beth Barber said she and her sister enjoy cooking as form of relaxation. She said her sister does her own ADLs and is fairly independent. Client is trying to cook healthy meals, eat fruits and vegetables. Using portion control method. Client has been communicating with nutritionist to discuss healthy meals for client. Beth Barber said she has appointment with nutritionist next month Client has U Card to use for food procurement and use of U card is helpful to client Client said that edema issues have been improving Client said she has occasional knee pain issues Client said she uses [employer] bus as needed to go to and from client appointments Discussed medical support at PCP office. (Office of Dr. Fidel Levy). Client said she actually lives close to PCP office and can actually walk to PCP office. This is very helpful to client in receiving care.   LCSW encouraged Beth Barber to call PCP office as needed to discuss medical needs of client. Discussed mood of client. She feels that her mood is stable at present Provided counseling support for client. Discussed client shortness of breath. She spoke of asthma issues. She said she has to take rest breaks. She said sometimes by early afternoon she may be fatigued Discussed ambulation of client. Discussed medication procurement. Spoke with client about program support with RN, LCSW, Pharmacist Discussed vision of client. Discussed sleeping issues. She said she is having some difficulty in sleeping Reminded client that she can call LCSW for SW support at [phone]. Thanked client for phone call with LCSW today Client was appreciative of call from LCSW today          SDOH assessments and interventions completed:  Yes  SDOH Interventions Today    Flowsheet Row Most Recent Value  SDOH Interventions   Depression Interventions/Treatment  Counseling  Physical Activity Interventions Other (Comments)  [client has stress in managing medical needs]        Care Coordination Interventions:  Yes, provided   Interventions Today    Flowsheet Row Most Recent Value  Chronic Disease   Chronic disease during today's visit Other  [spoke with client about client needs]  General Interventions   General Interventions Discussed/Reviewed General Interventions Discussed, Community Resources  Education Interventions   Education Provided Provided Education  Provided Verbal Education On  Community Resources  Mental Health Interventions   Mental Health Discussed/Reviewed Coping Strategies  Nutrition Interventions   Nutrition Discussed/Reviewed Nutrition Discussed  Pharmacy Interventions   Pharmacy Dicussed/Reviewed Pharmacy Topics Discussed  Safety Interventions   Safety Discussed/Reviewed Fall Risk        Follow up plan: LCSW has provided Beth Barber with LCSW name and phone number. LCSW has encouraged  Beth Barber to call LCSW as needed for SW support at [phone]   Encounter Outcome:  Patient Visit Completed    Lorna Few  MSW, LCSW [place]/[facility] Licensed Clinical Social Worker Direct Dial:  [phone] Fax:  [phone] Website:  [email or web address]

## 2023-11-12 NOTE — Patient Instructions (Signed)
 Visit Information  Thank you for taking time to visit with me today. Please don't hesitate to contact me if I can be of assistance to you.   Following are the goals we discussed today:   Goals Addressed               This Visit's Progress     Patient Stated she is concerned about housing issues. She is looking for new apartment (pt-stated)        Interventions:  Spoke with client via phone today about client  current status and health needs Spoke with client about housing needs. She said she is still looking for a new apartment. She spoke of apartment complex being built in area. Client has to climb some steps to her apartment at present. She is walking for exercise. She hopes that regular walking exercises will help her with climbing stairs. Spoke about needs of client's sister. Client's sister resides with client. Client provides support for her sister as needed. Janeece said she and her sister enjoy cooking as form of relaxation. She said her sister does her own ADLs and is fairly independent. Client is trying to cook healthy meals, eat fruits and vegetables. Using portion control method. Client has been communicating with nutritionist to discuss healthy meals for client. Shaleena said she has appointment with nutritionist next month Client has U Card to use for food procurement and use of U card is helpful to client Client said that edema issues have been improving Client said she has occasional knee pain issues Client said she uses [employer] bus as needed to go to and from client appointments Discussed medical support at PCP office. (Office of Dr. Fidel Levy). Client said she actually lives close to PCP office and can actually walk to PCP office. This is very helpful to client in receiving care.  LCSW encouraged Nayelly to call PCP office as needed to discuss medical needs of client. Discussed mood of client. She feels that her mood is stable at present Provided counseling support for  client. Discussed client shortness of breath. She spoke of asthma issues. She said she has to take rest breaks. She said sometimes by early afternoon she may be fatigued Discussed ambulation of client. Discussed medication procurement. Spoke with client about program support with RN, LCSW, Pharmacist Discussed vision of client. Discussed sleeping issues. She said she is having some difficulty in sleeping Reminded client that she can call LCSW for SW support at [phone]. Thanked client for phone call with LCSW today Client was appreciative of call from LCSW today         LCSW has  provided client with LCSW name and phone number. LCSW has encouraged client to call LCSW as needed for SW support at [phone]  Please call the care guide team at [phone] if you need to cancel or reschedule your appointment.   If you are experiencing a Mental Health or Behavioral Health Crisis or need someone to talk to, please go to [facility] [address] [place] [phone])   The patient verbalized understanding of instructions, educational materials, and care plan provided today and DECLINED offer to receive copy of patient instructions, educational materials, and care plan.   The patient has been provided with contact information for the care management team and has been advised to call with any health related questions or concerns.    Lorna Few  MSW, LCSW [employer] [facility] [employer] Licensed Clinical Social  Worker Warehouse manager:  [phone] Fax:  [phone] Website:  [email or web address]

## 2023-11-18 NOTE — Patient Instructions (Signed)
 Visit Information  Thank you for taking time to visit with me today. Please don't hesitate to contact me if I can be of assistance to you.   Following are the goals we discussed today:   Goals Addressed             This Visit's Progress    Education and management of health conditions - care coordination services       Care Coordination Interventions: Interventions Today    Flowsheet Row Most Recent Value  Chronic Disease   Chronic disease during today's visit Diabetes, Other  General Interventions   General Interventions Discussed/Reviewed General Interventions Reviewed, Doctor Visits, [facility], Annual Eye Exam  Larose Kells glasses, dentures]  Doctor Visits Discussed/Reviewed Doctor Visits Reviewed, PCP, Specialist  PCP/Specialist Visits Compliance with follow-up visit  Exercise Interventions   Exercise Discussed/Reviewed Exercise Reviewed, Physical Activity, Weight Managment  Physical Activity Discussed/Reviewed Physical Activity Reviewed  Weight Management Weight maintenance  Education Interventions   Education Provided Provided Education  [assisted to upload her full plate living app]  Provided Verbal Education On Community Resources  Mental Health Interventions   Mental Health Discussed/Reviewed Mental Health Reviewed, Coping Strategies  Nutrition Interventions   Nutrition Discussed/Reviewed Nutrition Reviewed, Adding fruits and vegetables, Fluid intake, Portion sizes, Increasing proteins  Pharmacy Interventions   Pharmacy Dicussed/Reviewed Pharmacy Topics Reviewed, Affording Medications              Our next appointment is by telephone on 12/04/23 at 3 pm  Please call the care guide team at [phone] if you need to cancel or reschedule your appointment.   If you are experiencing a Mental Health or Behavioral Health Crisis or need someone to talk to, please call the Suicide and Crisis Lifeline: 988 call the [place] National Suicide Prevention Lifeline:  6034219470 or TTY: 325-640-4744 TTY 646-368-8831) to talk to a trained counselor call 1-800-273-TALK (toll free, 24 hour hotline) call the [facility]: 786-800-4429 call 911   Patient verbalizes understanding of instructions and care plan provided today and agrees to view in MyChart. Active MyChart status and patient understanding of how to access instructions and care plan via MyChart confirmed with patient.     The patient has been provided with contact information for the care management team and has been advised to call with any health related questions or concerns.   Doil Kamara L. Noelle Penner, RN, BSN, CCM [place]  [facility], [facility] RN Care Manager Direct Dial: [phone]  Fax: [phone]

## 2023-11-27 ENCOUNTER — Encounter: Payer: Self-pay | Admitting: *Deleted

## 2023-11-27 ENCOUNTER — Other Ambulatory Visit: Payer: Self-pay | Admitting: *Deleted

## 2023-11-27 NOTE — Patient Instructions (Signed)
 Visit Information  Thank you for taking time to visit with me today. Please don't hesitate to contact me if I can be of assistance to you before our next scheduled appointment.  Your next care management appointment is by telephone on 12/04/23 at 3 pm  Telephone follow up appointment date/time:  12/04/23 3 pm  Please call the care guide team at [phone] if you need to cancel, schedule, or reschedule an appointment.   Please call the Suicide and Crisis Lifeline: 988 call the [place] National Suicide Prevention Lifeline: 458-318-5272 or TTY: 4791106768 TTY 902 268 3231) to talk to a trained counselor call 1-800-273-TALK (toll free, 24 hour hotline) call the [facility]: (210)482-2217 call 911 if you are experiencing a Mental Health or Behavioral Health Crisis or need someone to talk to.  Freddye Cardamone L. Noelle Penner, RN, BSN, CCM [place]  [facility], [facility] [employer] [facility] RN Care Manager Direct Dial: [phone]  Fax: [phone]

## 2023-11-27 NOTE — Patient Outreach (Addendum)
 Complex Care Management   Visit Note  11/27/2023  Name:  Beth Barber MRN: [id number] DOB: 1966/03/17  Situation: Referral received for Complex Care Management related to SDOH Barriers:  Housing asthma, hypertension, eczema, anemia and PMH hodgkin lymphoma  I obtained verbal consent from Dhara Weyenberg.  Visit completed with Naiara Gagliano  on the phone  Background:   Past Medical History:  Diagnosis Date   Asthma    Cancer (HCC)    Complication of anesthesia    difficult to wake up after anesthesia   GERD (gastroesophageal reflux disease)    Hypertension     Assessment: Patient Reported Symptoms:  Cognitive Able to follow simple commands, Alert and oriented to person, place, and time, Insightful and able to interpret abstract concepts, Normal speech and language skills  Neurological No symptoms reported    HEENT No symptoms reported    Cardiovascular Swelling in legs or feet    Respiratory Shortness of breath    Endocrine No symptoms reported    Gastrointestinal No symptoms reported    Genitourinary No symptoms reported    Integumentary No symptoms reported    Musculoskeletal Difficulty walking    Psychosocial Depression - if selected complete PHQ 2-9     There were no vitals filed for this visit.  Medications Reviewed Today     Reviewed by Clinton Gallant, RN (Registered Nurse) on 11/27/23 at 2138  Med List Status: <None>   Medication Order Taking? Sig Documenting Provider Last Dose Status Informant  albuterol (PROVENTIL) (2.5 MG/3ML) 0.083% nebulizer solution [id number]  USE 1 VIAL IN NEBULIZER THREE TIMES DAILY AS NEEDED [provider]  Active   albuterol (VENTOLIN HFA) 108 (90 Base) MCG/ACT inhaler [id number]  Inhale 1-2 puffs into the lungs every 6 (six) hours as needed for wheezing or shortness of breath. [provider]  Active Self  albuterol (VENTOLIN HFA) 108 (90 Base) MCG/ACT inhaler [id number]  Inhale 2 puffs into the lungs every 4 (four)  hours as needed. [provider]  Active   amLODipine (NORVASC) 5 MG tablet [id number]  Take 1 tablet every day by oral route for 90 days. [provider]  Active   Borage, Borago officinalis, (BORAGE OIL PO) [id number]  Take 1 capsule by mouth daily. [provider]  Active Self  Cholecalciferol (VITAMIN D) 50 MCG (2000 UT) CAPS [id number]  Take 4,000 Units by mouth daily. [provider]  Active Self  clobetasol ointment (TEMOVATE) 0.05 % [id number]  APPLY A THIN LAYER TO THE AFEFCTED AREA TWICE DAILY [provider]  Active   famotidine (PEPCID) 20 MG tablet [id number]  Take 1 tablet by mouth daily. [provider]  Active   Flax Oil-Fish Oil-Borage Oil (FISH OIL-FLAX OIL-BORAGE OIL PO) [id number]  Take by mouth. [provider]  Active            Med Note Chilton Si, Marcelyn Ditty Sep 02, 2022  9:27 AM) Patient states she takes every other day  Flax Oil-Fish Oil-Borage Oil CAPS [id number]  Take by oral route. [provider]  Active   halobetasol (ULTRAVATE) 0.05 % cream [id number]  APPLY CREAM TOPICALLY TO HANDS TWICE DAILY AS NEEDED. (NOT TO FACE, GROIN OR UNDERARMS) [provider]  Active   Roflumilast (ZORYVE) 0.15 % CREA [id number] Yes APPLY TO THE AFFECTED AREA(S) BY TOPICAL ROUTE ONCE DAILY [provider]  Active   triamcinolone cream (KENALOG) 0.1 % [id number]  APPLY A THIN LAYER TO THE AFFECTED  AREA(S) BY TOPICAL ROUTE 2 TIMES PER DAY [provider]  Active             Recommendation:   Follow up with her section 8 staff/[facility]BCI SW Attend pcp appointment or call pcp if any worsening symptoms  Follow Up Plan:   Telephone follow up appointment date/time:  12/04/23 3 pm  Camillia Marcy L. Noelle Penner, RN, BSN, CCM [place]  [facility], [facility] RN Care Manager Direct Dial: [phone]  Fax: [phone]

## 2023-12-03 ENCOUNTER — Encounter: Payer: 59 | Attending: Internal Medicine | Admitting: Nutrition

## 2023-12-03 VITALS — Ht 66.0 in | Wt 226.4 lb

## 2023-12-03 DIAGNOSIS — R7303 Prediabetes: Secondary | ICD-10-CM | POA: Insufficient documentation

## 2023-12-03 DIAGNOSIS — R635 Abnormal weight gain: Secondary | ICD-10-CM | POA: Insufficient documentation

## 2023-12-03 DIAGNOSIS — E559 Vitamin D deficiency, unspecified: Secondary | ICD-10-CM | POA: Insufficient documentation

## 2023-12-03 DIAGNOSIS — Z713 Dietary counseling and surveillance: Secondary | ICD-10-CM | POA: Insufficient documentation

## 2023-12-03 DIAGNOSIS — Z6836 Body mass index (BMI) 36.0-36.9, adult: Secondary | ICD-10-CM | POA: Insufficient documentation

## 2023-12-03 DIAGNOSIS — I10 Essential (primary) hypertension: Secondary | ICD-10-CM | POA: Diagnosis not present

## 2023-12-03 NOTE — Progress Notes (Signed)
 Medical Nutrition Therapy  Appointment Start time:  [phone]  Appointment End time: 1030  Primary concerns today: Unexplained weight gain, Pre Dm, Obesity  Referral diagnosis: R63.4, R74.03, E66.9 Preferred learning style: Listen and hear, see  Learning readiness: Ready   NUTRITION ASSESSMENT Pre Dm and Obesity Follow up She notes her weight  had gone up to 231 and now down to 227 lbs.  Changes made: Goals made last visit: Walk 30 minuets 3 times per week-Working on it- doing it mostly. Eat 2 vegetables with lunch and dinner daily.-done Eat fruit with each meal.-done Avoid junk food and processed foods-working on it Drink only water-improved. She is doing fantastic. Made a lot of changes. Cooking more, meal prepping,  eating more vegetables, fruit and started exercising.  Bowels are regular. GI is much better. Sleeping better. Goes to see PCP in January she thinks. No blood work to review.    She is willing to continue to work on Lifestyle Medicine.    Clinical Medical Hx:  Past Medical History:  Diagnosis Date   Asthma    Cancer (HCC)    Complication of anesthesia    difficult to wake up after anesthesia   GERD (gastroesophageal reflux disease)    Hypertension     Medications: Current Outpatient Medications on File Prior to Visit  Medication Sig Dispense Refill   albuterol (PROVENTIL) (2.5 MG/3ML) 0.083% nebulizer solution USE 1 VIAL IN NEBULIZER THREE TIMES DAILY AS NEEDED     albuterol (VENTOLIN HFA) 108 (90 Base) MCG/ACT inhaler Inhale 1-2 puffs into the lungs every 6 (six) hours as needed for wheezing or shortness of breath.     albuterol (VENTOLIN HFA) 108 (90 Base) MCG/ACT inhaler Inhale 2 puffs into the lungs every 4 (four) hours as needed.     amLODipine (NORVASC) 5 MG tablet Take 1 tablet every day by oral route for 90 days.     Borage, Borago officinalis, (BORAGE OIL PO) Take 1 capsule by mouth daily.     Cholecalciferol (VITAMIN D) 50 MCG (2000 UT) CAPS Take  4,000 Units by mouth daily.     clobetasol ointment (TEMOVATE) 0.05 % APPLY A THIN LAYER TO THE AFEFCTED AREA TWICE DAILY     famotidine (PEPCID) 20 MG tablet Take 1 tablet by mouth daily.     Flax Oil-Fish Oil-Borage Oil (FISH OIL-FLAX OIL-BORAGE OIL PO) Take by mouth.     Flax Oil-Fish Oil-Borage Oil CAPS Take by oral route.     halobetasol (ULTRAVATE) 0.05 % cream APPLY CREAM TOPICALLY TO HANDS TWICE DAILY AS NEEDED. (NOT TO FACE, GROIN OR UNDERARMS)     Roflumilast (ZORYVE) 0.15 % CREA APPLY TO THE AFFECTED AREA(S) BY TOPICAL ROUTE ONCE DAILY     triamcinolone cream (KENALOG) 0.1 % APPLY A THIN LAYER TO THE AFFECTED AREA(S) BY TOPICAL ROUTE 2 TIMES PER DAY     No current facility-administered medications on file prior to visit.     Labs:     Latest Ref Rng & Units 04/09/2023    9:01 AM 04/08/2022    8:48 AM 10/12/2021    1:29 PM  CMP  Glucose 70 - 99 mg/dL 960  454  098   BUN 6 - 20 mg/dL 11  12  13    Creatinine 0.44 - 1.00 mg/dL 1.19  1.47  8.29   Sodium 135 - 145 mmol/L 136  136  138   Potassium 3.5 - 5.1 mmol/L 4.0  3.9  3.9   Chloride 98 -  111 mmol/L 105  104  107   CO2 22 - 32 mmol/L 22  25  25    Calcium 8.9 - 10.3 mg/dL 9.2  9.4  8.9   Total Protein 6.5 - 8.1 g/dL 7.9  8.1    Total Bilirubin 0.3 - 1.2 mg/dL 0.6  0.5    Alkaline Phos 38 - 126 U/L 56  51    AST 15 - 41 U/L 36  28    ALT 0 - 44 U/L 29  25     Wt Readings from Last 3 Encounters:  12/03/23 226 lb 6.4 oz (102.7 kg)  08/05/23 227 lb (103 kg)  04/16/23 225 lb 8.5 oz (102.3 kg)   Ht Readings from Last 3 Encounters:  12/03/23 5\' 6"  (1.676 m)  08/05/23 5\' 6"  (1.676 m)  08/01/22 5\' 5"  (1.651 m)   Body mass index is 36.54 kg/m. @BMIFA @ Facility age limit for growth %iles is 20 years. Facility age limit for growth %iles is 20 years.  Notable Signs/Symptoms: None  Lifestyle & Dietary Hx LIves by herself. Her sister lives with her. Usually eats 1 meal per day  Estimated daily fluid intake: 40  oz Supplements:  Sleep: poor Stress / self-care: a lot of stress Current average weekly physical activity: Not exercising but active in cleaning house   24-Hr Dietary Recall Skipped breakfast 2 pm [employer]- hot ham and cheese and fried and sprite Snack pB nabs 8 pm: Baked chicken, water Bubbly soda,   Estimated Energy Needs Calories: 1200 Carbohydrate: 135g Protein: 90g Fat: 33g   NUTRITION DIAGNOSIS  NI-1.7 Predicted excessive energy intake As related to inconsistent food intake from processed foods/high calorie diet.  As evidenced by 25 lb weight gain and BMI>30.   NUTRITION INTERVENTION  Nutrition education (E-1) on the following topics:  Nutrition and Pre- Diabetes education provided on My Plate, CHO counting, meal planning, portion sizes, timing of meals, avoiding snacks between meals unless having a low blood sugar, target ranges for A1C and blood sugars, signs/symptoms and treatment of hyper/hypoglycemia, monitoring blood sugars, taking medications as prescribed, benefits of exercising 30 minutes per day and prevention of complications of DM.  Lifestyle Medicine  - Whole Food, Plant Predominant Nutrition is highly recommended: Eat Plenty of vegetables, Mushrooms, fruits, Legumes, Whole Grains, Nuts, seeds in lieu of processed meats, processed snacks/pastries red meat, poultry, eggs.    -It is better to avoid simple carbohydrates including: Cakes, Sweet Desserts, Ice Cream, Soda (diet and regular), Sweet Tea, Candies, Chips, Cookies, Store Bought Juices, Alcohol in Excess of  1-2 drinks a day, Lemonade,  Artificial Sweeteners, Doughnuts, Coffee Creamers, "Sugar-free" Products, etc, etc.  This is not a complete list.....  Exercise: If you are able: 30 -60 minutes a day ,4 days a week, or 150 minutes a week.  The longer the better.  Combine stretch, strength, and aerobic activities.  If you were told in the past that you have high risk for cardiovascular diseases, you may seek  evaluation by your heart doctor prior to initiating moderate to intense exercise programs.  Handouts Provided Include  Lifestyle Medicine handouts  Learning Style & Readiness for Change Teaching method utilized: Visual & Auditory  Demonstrated degree of understanding via: Teach Back  Barriers to learning/adherence to lifestyle change: None  Goals Established by Pt Goals Work on portion sizes-not as good as she wanted. Go to full plate living website. Keep working out 5 times per week. Lose 2 lbs per month.  MONITORING &  EVALUATION Dietary intake, weekly physical activity, and weight in 4 months.  Next Steps  Patient is to work on meal planning and meal planning.Aaron Aas

## 2023-12-03 NOTE — Patient Instructions (Addendum)
 Goals Downloaded Mapmywalk Try to work up to walking 2 miles 3 times per week Keep eating foods from garden Try to exericse to burn off 250 calories per day Keep eating veggies and add some beans for protein.

## 2023-12-04 ENCOUNTER — Other Ambulatory Visit: Payer: Self-pay

## 2023-12-04 ENCOUNTER — Ambulatory Visit: Payer: Self-pay | Admitting: *Deleted

## 2023-12-04 NOTE — Patient Outreach (Addendum)
 Complex Care Management   Visit Note  12/04/2023  Name:  Beth Barber MRN: [id number] DOB: [birth year]  Situation: Referral received for Complex Care Management related to SDOH Barriers:  Housing asthma, hypertension, eczema, anemia and  PMH hodgkin lymphoma  I obtained verbal consent from Patient.  Visit completed with [name]  on the phone  Background:   Past Medical History:  Diagnosis Date   Asthma    Cancer (HCC)    Complication of anesthesia    difficult to wake up after anesthesia   GERD (gastroesophageal reflux disease)    Hypertension     Assessment: Patient Reported Symptoms:  Cognitive Cognitive Status: Alert and oriented to person, place, and time, Normal speech and language skills, Insightful and able to interpret abstract concepts   Health Maintenance Behaviors: Annual physical exam, Sleep adequate, Hobbies, Exercise Healing Pattern: Average Health Facilitated by: Healthy diet, Prayer/meditation, Rest  Neurological   Neurological Management Strategies: Activity, Adequate rest, Diet modification, Exercise, Fluid modification, Medication therapy, Routine screening, Weight management Neurological Self-Management Outcome: 4 (good)  HEENT HEENT Symptoms Reported: No symptoms reported HEENT Conditions: Vision problem(s) Vision Problems:  (wears glasses) HEENT Management Strategies: Adequate rest, Activity, Exercise, Diet modification, Fluid modification, Routine screening, Weight management HEENT Self-Management Outcome: 4 (good) Vision problem(s)  Cardiovascular Cardiovascular Symptoms Reported: No symptoms reported Does patient have uncontrolled Hypertension?: No Cardiovascular Conditions: High blood cholesterol, Hypertension Cardiovascular Management Strategies: Activity, Adequate rest, Coping strategies, Diet modification, Exercise, Fluid modification, Medication therapy, Routine screening Weight: 226 lb (102.5 kg) Cardiovascular Self-Management Outcome: 4  (good) Cardiovascular Comment: resolved swelling of legs  Respiratory Respiratory Symptoms Reported: Not assesed    Endocrine Patient reports the following symptoms related to hypoglycemia or hyperglycemia : No symptoms reported Is patient diabetic?:  (prediabetes confirmed with patient 09/18/23 HgA1c = 6,3. She has stay from 5.9 to 6.3 for the last 2 years) Endocrine Conditions: Vitamin D deficiency Endocrine Management Strategies: Activity, Adequate rest, Routine screening, Weight management, Diet modification, Exercise Endocrine Self-Management Outcome: 4 (good)  Gastrointestinal Gastrointestinal Symptoms Reported: No symptoms reported      Genitourinary  Genitourinary Symptoms Reported: Not assessed    Integumentary Integumentary Symptoms Reported: Not assessed    Musculoskeletal Musculoskelatal Symptoms Reviewed: Muscle pain Additional Musculoskeletal Details: reports pain in both shoulders in the mornings. this symptom started this week. Voice she believes it is related to sleep positions because of menopause symptoms. She confirms pain goes away throughout the day. Back pain resolved Musculoskeletal Conditions: Joint pain Musculoskeletal Management Strategies: Activity, Adequate rest, Coping strategies, Diet modification, Exercise, Fluid modification, Medication therapy, Routine screening Musculoskeletal Self-Management Outcome: 3 (uncertain) Musculoskeletal Comment: She has started exercising more and confirms more shoulder exercises, Confirms warming up.Resolved with use of biofreeze Falls in the past year?: No Number of falls in past year: 1 or less Was there an injury with Fall?: No Fall Risk Category Calculator: 0 Patient Fall Risk Level: Low Fall Risk Patient at Risk for Falls Due to: No Fall Risks Fall risk Follow up: Falls evaluation completed  Psychosocial Psychosocial Symptoms Reported: Not assessed     Quality of Family Relationships: helpful, supportive Do you feel  physically threatened by others?: No      11/27/2023    8:58 PM  Depression screen PHQ 2/9  Decreased Interest 1  Down, Depressed, Hopeless 1  PHQ - 2 Score 2  Altered sleeping 1  Tired, decreased energy 1  Change in appetite 1  Feeling bad or failure about yourself  1  Trouble concentrating 0  Moving slowly or fidgety/restless 0  Suicidal thoughts 0  PHQ-9 Score 6  Difficult doing work/chores Somewhat difficult    There were no vitals filed for this visit.  Medications Reviewed Today     Reviewed by Arlyce Berger, RN (Registered Nurse) on 12/04/23 at 1600  Med List Status: <None>   Medication Order Taking? Sig Documenting Provider Last Dose Status Informant  albuterol (PROVENTIL) (2.5 MG/3ML) 0.083% nebulizer solution [id number] Yes USE 1 VIAL IN NEBULIZER THREE TIMES DAILY AS NEEDED [provider] Taking Active   albuterol (VENTOLIN HFA) 108 (90 Base) MCG/ACT inhaler [id number] Yes Inhale 1-2 puffs into the lungs every 6 (six) hours as needed for wheezing or shortness of breath. [provider] Taking Active Self  albuterol (VENTOLIN HFA) 108 (90 Base) MCG/ACT inhaler [id number] Yes Inhale 2 puffs into the lungs every 4 (four) hours as needed. [provider] Taking Active   amLODipine (NORVASC) 5 MG tablet [id number] Yes Take 1 tablet every day by oral route for 90 days. [provider] Taking Active   Borage, Borago officinalis, (BORAGE OIL PO) [id number] Yes Take 1 capsule by mouth daily. [provider] Taking Active Self  Cholecalciferol (VITAMIN D) 50 MCG (2000 UT) CAPS [id number] Yes Take 4,000 Units by mouth daily. [provider] Taking Active Self  clobetasol ointment (TEMOVATE) 0.05 % [id number] Yes APPLY A THIN LAYER TO THE AFEFCTED AREA TWICE DAILY [provider] Taking Active   famotidine (PEPCID) 20 MG tablet [id number] Yes Take 1 tablet by mouth daily. [provider] Taking Active   Flax  Oil-Fish Oil-Borage Oil (FISH OIL-FLAX OIL-BORAGE OIL PO) [id number] Yes Take by mouth. [provider] Taking Active            Med Note Marrie Sizer, [place] Sep 02, 2022  9:27 AM) Patient states she takes every other day  Flax Oil-Fish Oil-Borage Oil CAPS [id number] Yes Take by oral route. [provider] Taking Active   halobetasol (ULTRAVATE) 0.05 % cream [id number] Yes APPLY CREAM TOPICALLY TO HANDS TWICE DAILY AS NEEDED. (NOT TO FACE, GROIN OR UNDERARMS) [provider] Taking Active   Roflumilast (ZORYVE) 0.15 % CREA [id number] Yes APPLY TO THE AFFECTED AREA(S) BY TOPICAL ROUTE ONCE DAILY [provider] Taking Active   triamcinolone cream (KENALOG) 0.1 % [id number] Yes APPLY A THIN LAYER TO THE AFFECTED AREA(S) BY TOPICAL ROUTE 2 TIMES PER DAY [provider] Taking Active             Recommendation:   Specialty provider follow-up oncology 04/08/24 8 am & 8:30 am 04/15/24 Pcp labs 12/31/23 1020, Maryelizabeth Smolder 01/06/24 1120   Follow Up Plan:   Telephone follow up appointment date/time:  02/03/24 3:15 pm   Jullie Oiler L. Mcarthur Speedy, RN, BSN, CCM [place]  [facility], [facility] RN Care Manager Direct Dial: [phone]  Fax: [phone]

## 2023-12-04 NOTE — Patient Instructions (Signed)
 Visit Information  Thank you for taking time to visit with me today. Please don't hesitate to contact me if I can be of assistance to you before our next scheduled appointment.  Your next care management appointment is by telephone on 02/03/24 at 3:15 pm  Patient goals Attend all scheduled provider appointments Call provider office for new concerns or questions  Take medications as prescribed   drink 6 to 8 glasses of water each day eat fish at least once per week fill half of plate with vegetables manage portion size prepare main meal at home 3 to 5 days each week switch to sugar-free drinks Track her calories when walking- goal 250 calories.  Be more conscious of portion sizes,  aim for 2 lb weight loss per month,  Keep working out 5 times per week.  Regain access of her my chart services    Please call the care guide team at [phone] if you need to cancel, schedule, or reschedule an appointment.   Please call the Suicide and Crisis Lifeline: 988 call the [place]  National Suicide Prevention Lifeline: 563-861-1629 or TTY: 667-058-4891 TTY 334-784-8345) to talk to a trained counselor call 1-800-273-TALK (toll free, 24 hour hotline) call the [facility]: (905)[phone] call 911 if you are experiencing a Mental Health or Behavioral Health Crisis or need someone to talk to.  Rozalynn Buege L. Mcarthur Speedy, RN, BSN, CCM [place]  [facility], [facility] RN Care Manager Direct Dial: [phone]  Fax: [phone]

## 2023-12-05 ENCOUNTER — Telehealth: Payer: Self-pay | Admitting: *Deleted

## 2023-12-05 NOTE — Patient Outreach (Signed)
 Collaboration with [facility] SW [place] [facility] about progression of housing for patient.  Azyah Flett L. Mcarthur Speedy, RN, BSN, CCM [place]  [facility], [facility] RN Care Manager Direct Dial: [phone]  Fax: [phone]

## 2023-12-09 ENCOUNTER — Encounter: Payer: Self-pay | Admitting: Nutrition

## 2024-01-06 DIAGNOSIS — D509 Iron deficiency anemia, unspecified: Secondary | ICD-10-CM | POA: Diagnosis not present

## 2024-01-06 DIAGNOSIS — R7303 Prediabetes: Secondary | ICD-10-CM | POA: Diagnosis not present

## 2024-01-06 DIAGNOSIS — I10 Essential (primary) hypertension: Secondary | ICD-10-CM | POA: Diagnosis not present

## 2024-01-16 DIAGNOSIS — R809 Proteinuria, unspecified: Secondary | ICD-10-CM | POA: Diagnosis not present

## 2024-01-16 DIAGNOSIS — E785 Hyperlipidemia, unspecified: Secondary | ICD-10-CM | POA: Diagnosis not present

## 2024-01-16 DIAGNOSIS — I10 Essential (primary) hypertension: Secondary | ICD-10-CM | POA: Diagnosis not present

## 2024-01-16 DIAGNOSIS — E559 Vitamin D deficiency, unspecified: Secondary | ICD-10-CM | POA: Diagnosis not present

## 2024-01-16 DIAGNOSIS — R0600 Dyspnea, unspecified: Secondary | ICD-10-CM | POA: Diagnosis not present

## 2024-01-16 DIAGNOSIS — R718 Other abnormality of red blood cells: Secondary | ICD-10-CM | POA: Diagnosis not present

## 2024-01-16 DIAGNOSIS — M8580 Other specified disorders of bone density and structure, unspecified site: Secondary | ICD-10-CM | POA: Diagnosis not present

## 2024-01-16 DIAGNOSIS — M545 Low back pain, unspecified: Secondary | ICD-10-CM | POA: Diagnosis not present

## 2024-01-16 DIAGNOSIS — R7303 Prediabetes: Secondary | ICD-10-CM | POA: Diagnosis not present

## 2024-01-16 DIAGNOSIS — K219 Gastro-esophageal reflux disease without esophagitis: Secondary | ICD-10-CM | POA: Diagnosis not present

## 2024-01-16 DIAGNOSIS — L309 Dermatitis, unspecified: Secondary | ICD-10-CM | POA: Diagnosis not present

## 2024-01-28 DIAGNOSIS — R600 Localized edema: Secondary | ICD-10-CM | POA: Insufficient documentation

## 2024-01-28 DIAGNOSIS — R718 Other abnormality of red blood cells: Secondary | ICD-10-CM | POA: Insufficient documentation

## 2024-01-28 DIAGNOSIS — R809 Proteinuria, unspecified: Secondary | ICD-10-CM | POA: Insufficient documentation

## 2024-02-03 ENCOUNTER — Encounter: Payer: Self-pay | Admitting: *Deleted

## 2024-02-03 ENCOUNTER — Other Ambulatory Visit: Payer: Self-pay | Admitting: *Deleted

## 2024-02-03 NOTE — Patient Outreach (Signed)
 Complex Care Management   Visit Note  03/19/2024 updated note for 02/03/24  Name:  Beth Barber MRN: [id number] DOB: 1966/05/15  Situation: Referral received for Complex Care Management related to SDOH Barriers:  Housing asthma, hodgkin lymphoma and hypertension I obtained verbal consent from Patient.  Visit completed with Earnie Stager  on the phone  She is anticipating moving to another apartment   Background:   Past Medical History:  Diagnosis Date   Asthma    Cancer (HCC)    Complication of anesthesia    difficult to wake up after anesthesia   GERD (gastroesophageal reflux disease)    Hypertension     Assessment: Patient Reported Symptoms:  Cognitive Cognitive Status: Alert and oriented to person, place, and time, Normal speech and language skills Cognitive/Intellectual Conditions Management [RPT]: None reported or documented in medical history or problem list   Health Maintenance Behaviors: Annual physical exam, Healthy diet, Sleep adequate, Social activities, Spiritual practice(s) Healing Pattern: Average Health Facilitated by: Rest, Prayer/meditation, Healthy diet  Neurological Neurological Review of Symptoms: No symptoms reported Neurological Management Strategies: Adequate rest, Activity, Routine screening Neurological Self-Management Outcome: 4 (good)  HEENT HEENT Symptoms Reported: No symptoms reported HEENT Management Strategies: Adequate rest, Routine screening HEENT Self-Management Outcome: 4 (good)    Cardiovascular Cardiovascular Symptoms Reported: Swelling in legs or feet    Respiratory Respiratory Symptoms Reported: No symptoms reported Respiratory Self-Management Outcome: 4 (good)  Endocrine Endocrine Symptoms Reported: Other    Gastrointestinal Gastrointestinal Symptoms Reported: Constipation Gastrointestinal Management Strategies: Adequate rest, Activity, Diet modification, Medication therapy Gastrointestinal Self-Management Outcome: 3 (uncertain)     Genitourinary Genitourinary Symptoms Reported: Frequency Genitourinary Management Strategies: Adequate rest Genitourinary Self-Management Outcome: 4 (good)  Integumentary Integumentary Symptoms Reported: Skin changes Additional Integumentary Details: ezcema hands Skin Management Strategies: Medication therapy Skin Self-Management Outcome: 4 (good)  Musculoskeletal Musculoskelatal Symptoms Reviewed: Joint pain Musculoskeletal Management Strategies: Adequate rest, Routine screening Musculoskeletal Self-Management Outcome: 4 (good) Falls in the past year?: No Number of falls in past year: 1 or less Was there an injury with Fall?: No Fall Risk Category Calculator: 0 Patient Fall Risk Level: Low Fall Risk Patient at Risk for Falls Due to: No Fall Risks Fall risk Follow up: Falls evaluation completed, Falls prevention discussed  Psychosocial Psychosocial Symptoms Reported: Sadness - if selected complete PHQ 2-9, Anxiety - if selected complete GAD   Major Change/Loss/Stressor/Fears (CP): Medical condition, self, Medical condition, family Techniques to [place] with Loss/Stress/Change: Diversional activities Quality of Family Relationships: supportive, helpful Do you feel physically threatened by others?: No      11/27/2023    8:58 PM  Depression screen PHQ 2/9  Decreased Interest 1  Down, Depressed, Hopeless 1  PHQ - 2 Score 2  Altered sleeping 1  Tired, decreased energy 1  Change in appetite 1  Feeling bad or failure about yourself  1  Trouble concentrating 0  Moving slowly or fidgety/restless 0  Suicidal thoughts 0  PHQ-9 Score 6  Difficult doing work/chores Somewhat difficult    There were no vitals filed for this visit.  Medications Reviewed Today     Reviewed by Ramonita Suzen CROME, RN (Registered Nurse) on 02/03/24 at 1621  Med List Status: <None>   Medication Order Taking? Sig Documenting Provider Last Dose Status Informant  albuterol (PROVENTIL) (2.5 MG/3ML) 0.083%  nebulizer solution [id number] Yes USE 1 VIAL IN NEBULIZER THREE TIMES DAILY AS NEEDED [provider]  Active   albuterol (VENTOLIN HFA) 108 (90 Base) MCG/ACT inhaler [id number] Yes  Inhale 1-2 puffs into the lungs every 6 (six) hours as needed for wheezing or shortness of breath. [provider]  Active Self  albuterol (VENTOLIN HFA) 108 (90 Base) MCG/ACT inhaler [id number]  Inhale 2 puffs into the lungs every 4 (four) hours as needed.  Patient not taking: Reported on 02/03/2024   [provider]  Active   amLODipine (NORVASC) 5 MG tablet [id number] Yes Take 1 tablet every day by oral route for 90 days.  Patient taking differently: Take 1 tablet every day by oral route for 90 days.   [provider]  Active   amLODipine (NORVASC) 5 MG tablet [id number] Yes Take 5 mg by mouth daily. [provider]  Active   Borage, Borago officinalis, (BORAGE OIL PO) [id number]  Take 1 capsule by mouth daily.  Patient not taking: Reported on 02/03/2024   [provider]  Active Self  Cholecalciferol (VITAMIN D) 50 MCG (2000 UT) CAPS [id number] Yes Take 4,000 Units by mouth daily. [provider]  Active Self  clobetasol  ointment (TEMOVATE ) 0.05 % [id number]  APPLY A THIN LAYER TO THE AFEFCTED AREA TWICE DAILY  Patient not taking: Reported on 02/03/2024   [provider]  Active   famotidine  (PEPCID ) 20 MG tablet [id number] Yes Take 1 tablet by mouth daily. [provider]  Active   Flax Oil-Fish Oil-Borage Oil (FISH OIL-FLAX OIL-BORAGE OIL PO) [id number]  Take by mouth.  Patient not taking: Reported on 02/03/2024   [provider]  Active            Med Note DORI, ARVIN FORBES Kitchens Sep 02, 2022  9:27 AM) Patient states she takes every other day  Flax Oil-Fish Oil-Borage Oil CAPS [id number] Yes Take by oral route. [provider]  Active   halobetasol (ULTRAVATE) 0.05 % cream [id number] Yes APPLY CREAM TOPICALLY TO HANDS TWICE  DAILY AS NEEDED. (NOT TO FACE, GROIN OR UNDERARMS) [provider]  Active   mometasone-formoterol (DULERA) 100-5 MCG/ACT TERESE [id number] Yes Inhale 2 puffs twice a day by inhalation route.  Patient taking differently: Inhale 2 puffs twice a day by inhalation route.   [provider]  Active   predniSONE  (DELTASONE ) 10 MG tablet [id number]  TAKE 6 TABLETS ON DAY 1 THEN TAKE 5 TABLETS ON DAY 2 THEN TAKE 4 TABLETS ON DAY 3 THEN TAKE 3 TABLETS ON DAY 4 THEN TAKE 2 TABLETS ON DAY 5 THEN TAKE 1 TABLET ON DAY 6. TAKE ALL DOSES WITH FOOD.  Patient not taking: Reported on 02/03/2024   [provider]  Active   Roflumilast (ZORYVE) 0.15 % CREA [id number]  APPLY TO THE AFFECTED AREA(S) BY TOPICAL ROUTE ONCE DAILY  Patient not taking: Reported on 02/03/2024   [provider]  Active   triamcinolone cream (KENALOG) 0.1 % [id number]  APPLY A THIN LAYER TO THE AFFECTED AREA(S) BY TOPICAL ROUTE 2 TIMES PER DAY  Patient not taking: Reported on 02/03/2024   [provider]  Active             Recommendation:   PCP Follow-up Continue Current Plan of Care Continue to work with your social worker and housing staff   Follow Up Plan:   Telephone follow up appointment date/time:  03/22/24 3:15 pm  Suzen L. Ramonita, RN, BSN, CCM [place]  [facility], [facility] Health RN Care Manager Direct Dial: [phone]  Fax: [phone]

## 2024-02-03 NOTE — Patient Instructions (Addendum)
 Visit Information  Thank you for taking time to visit with me today. Please don't hesitate to contact me if I can be of assistance to you before our next scheduled appointment.  Your next care management appointment is by telephone on 03/22/24 at 3:15 pm  Your [name] is on the way !  Please call the care guide team at [phone] if you need to cancel, schedule, or reschedule an appointment.   Please call the Suicide and Crisis Lifeline: 988 call the [place]  National Suicide Prevention Lifeline: (559)301-1249 or TTY: (203) 541-0214 TTY 667-622-1069) to talk to a trained counselor call 1-800-273-TALK (toll free, 24 hour hotline) call the [facility]: [phone] call 911 if you are experiencing a Mental Health or Behavioral Health Crisis or need someone to talk to.  [name], RN, BSN, CCM [place]  [facility], [facility] Health RN Care Manager Direct Dial: [phone]  Fax: [phone]

## 2024-02-24 ENCOUNTER — Other Ambulatory Visit (HOSPITAL_COMMUNITY): Payer: Self-pay

## 2024-02-24 DIAGNOSIS — M25552 Pain in left hip: Secondary | ICD-10-CM

## 2024-03-18 ENCOUNTER — Inpatient Hospital Stay: Attending: Hematology

## 2024-03-18 ENCOUNTER — Ambulatory Visit (HOSPITAL_COMMUNITY)
Admission: RE | Admit: 2024-03-18 | Discharge: 2024-03-18 | Disposition: A | Discharge status: Home / Self Care | Source: Ambulatory Visit

## 2024-03-18 DIAGNOSIS — C8190 Hodgkin lymphoma, unspecified, unspecified site: Secondary | ICD-10-CM | POA: Insufficient documentation

## 2024-03-18 DIAGNOSIS — M25552 Pain in left hip: Secondary | ICD-10-CM | POA: Insufficient documentation

## 2024-03-18 LAB — CBC WITH DIFFERENTIAL/PLATELET
Abs Immature Granulocytes: 0.05 K/uL (ref 0.00–0.07)
Basophils Absolute: 0 K/uL (ref 0.0–0.1)
Basophils Relative: 0 %
Eosinophils Absolute: 0 K/uL (ref 0.0–0.5)
Eosinophils Relative: 0 %
HCT: 38.7 % (ref 36.0–46.0)
Hemoglobin: 12.4 g/dL (ref 12.0–15.0)
Immature Granulocytes: 1 %
Lymphocytes Relative: 37 %
Lymphs Abs: 3.1 K/uL (ref 0.7–4.0)
MCH: 25.3 pg — ABNORMAL LOW (ref 26.0–34.0)
MCHC: 32 g/dL (ref 30.0–36.0)
MCV: 79 fL — ABNORMAL LOW (ref 80.0–100.0)
Monocytes Absolute: 0.6 K/uL (ref 0.1–1.0)
Monocytes Relative: 7 %
Neutro Abs: 4.8 K/uL (ref 1.7–7.7)
Neutrophils Relative %: 55 %
Platelets: 268 K/uL (ref 150–400)
RBC: 4.9 MIL/uL (ref 3.87–5.11)
RDW: 16.9 % — ABNORMAL HIGH (ref 11.5–15.5)
WBC: 8.6 K/uL (ref 4.0–10.5)
nRBC: 0 % (ref 0.0–0.2)

## 2024-03-18 LAB — COMPREHENSIVE METABOLIC PANEL WITH GFR
ALT: 19 U/L (ref 0–44)
AST: 15 U/L (ref 15–41)
Albumin: 3.5 g/dL (ref 3.5–5.0)
Alkaline Phosphatase: 52 U/L (ref 38–126)
Anion gap: 8 (ref 5–15)
BUN: 22 mg/dL — ABNORMAL HIGH (ref 6–20)
CO2: 24 mmol/L (ref 22–32)
Calcium: 9.2 mg/dL (ref 8.9–10.3)
Chloride: 108 mmol/L (ref 98–111)
Creatinine, Ser: 0.8 mg/dL (ref 0.44–1.00)
GFR, Estimated: >60 mL/min (ref >60)
Glucose, Bld: 93 mg/dL (ref 70–99)
Potassium: 3.8 mmol/L (ref 3.5–5.1)
Sodium: 140 mmol/L (ref 135–145)
Total Bilirubin: 0.5 mg/dL (ref 0.0–1.2)
Total Protein: 7.4 g/dL (ref 6.5–8.1)

## 2024-03-18 LAB — SEDIMENTATION RATE: Sed Rate: 17 mm/h (ref 0–30)

## 2024-03-18 LAB — LACTATE DEHYDROGENASE: LDH: 136 U/L (ref 98–192)

## 2024-03-20 IMAGING — CR DG LUMBAR SPINE 2-3V
3 series · 3 of 3 positions shown · non-contrast
Comparison: None Available.

CLINICAL DATA: Low back pain, unspecified.

EXAM:
LUMBAR SPINE - 2-3 VIEW

[t l spine  ap]
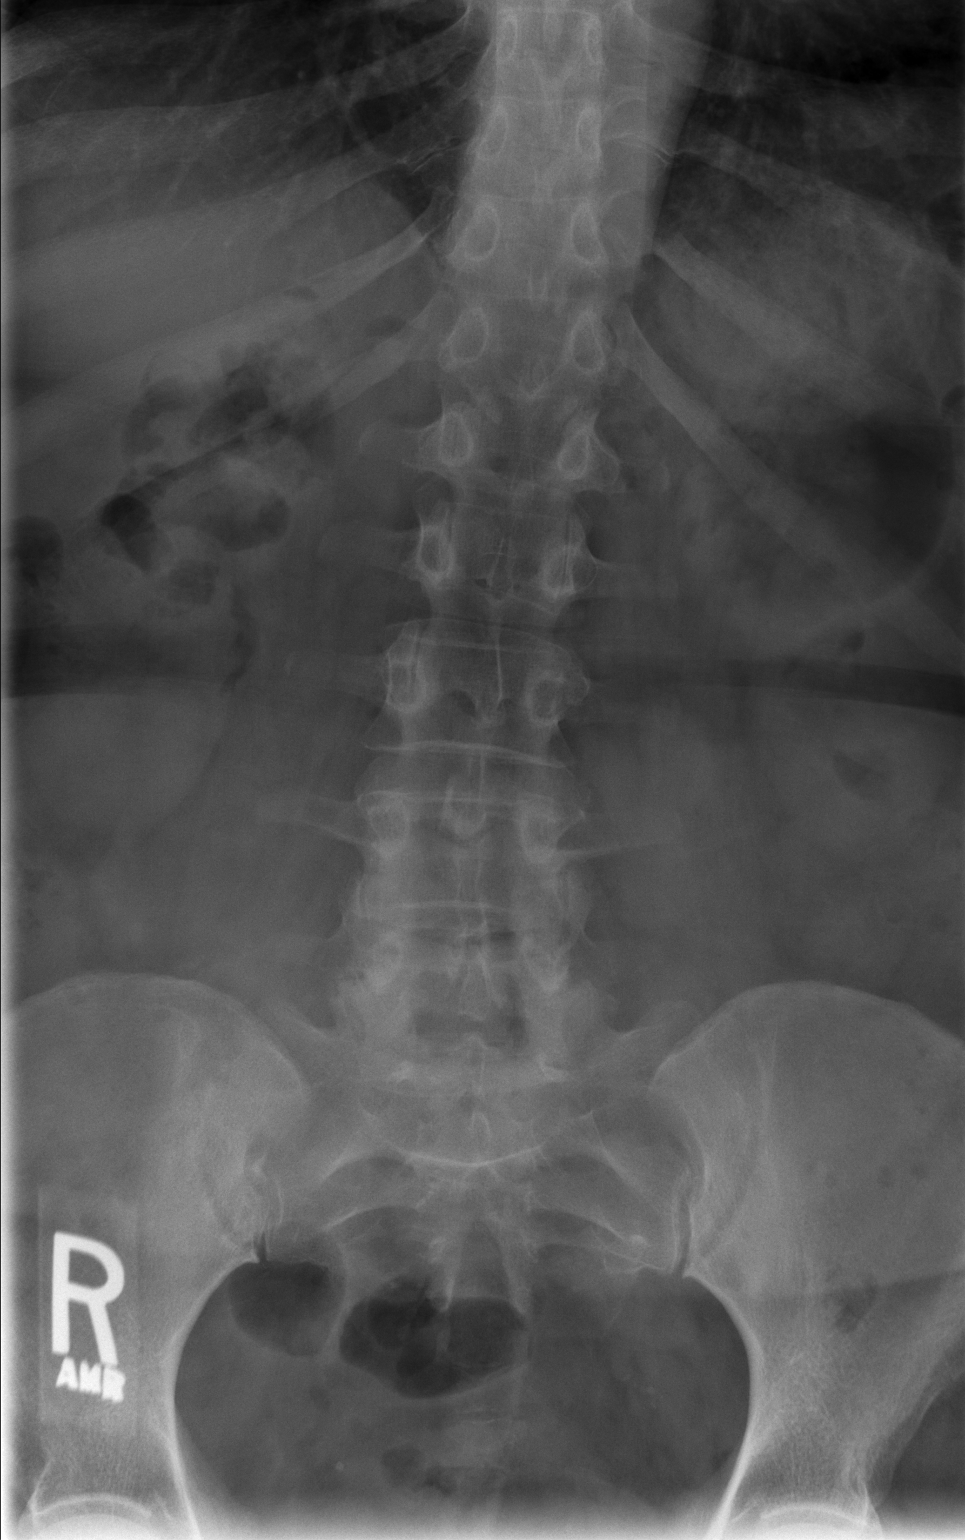

[t  l spine lateral]
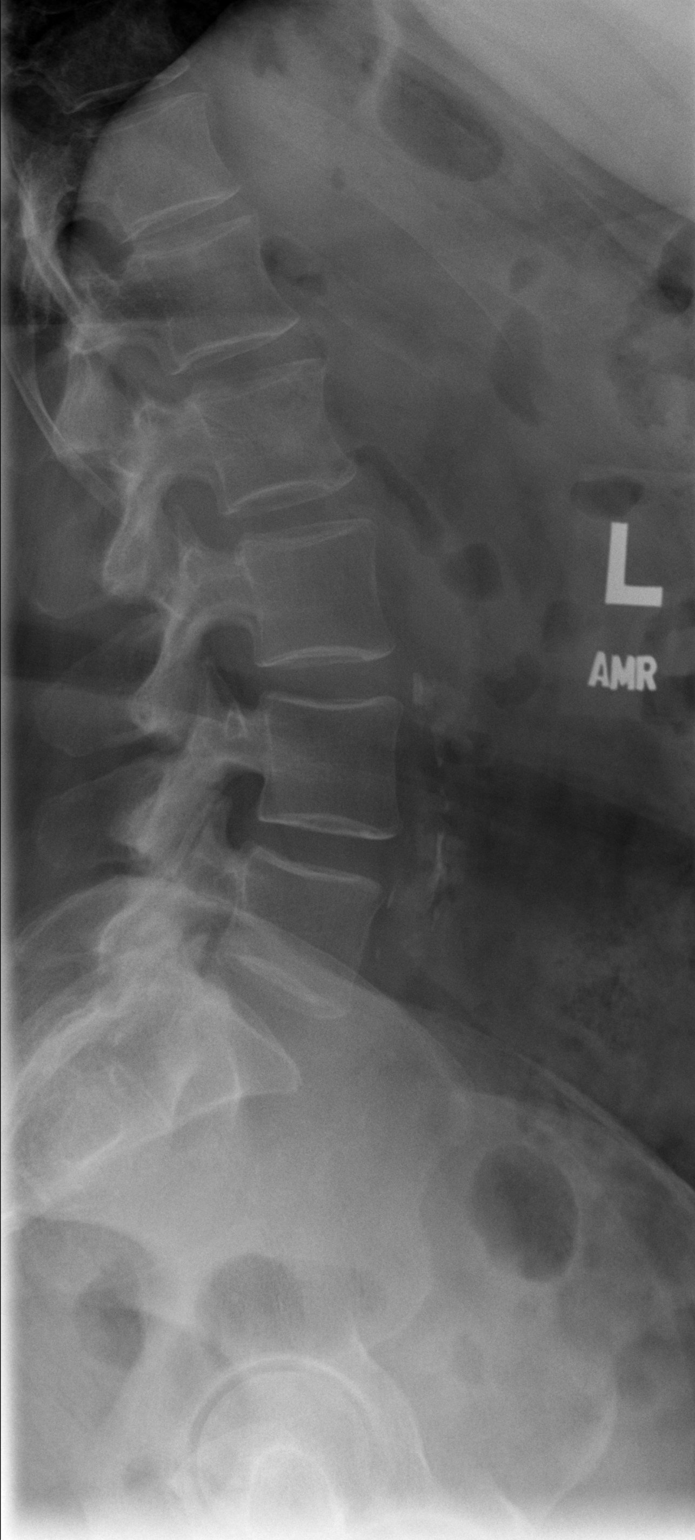

[t spot lateral]
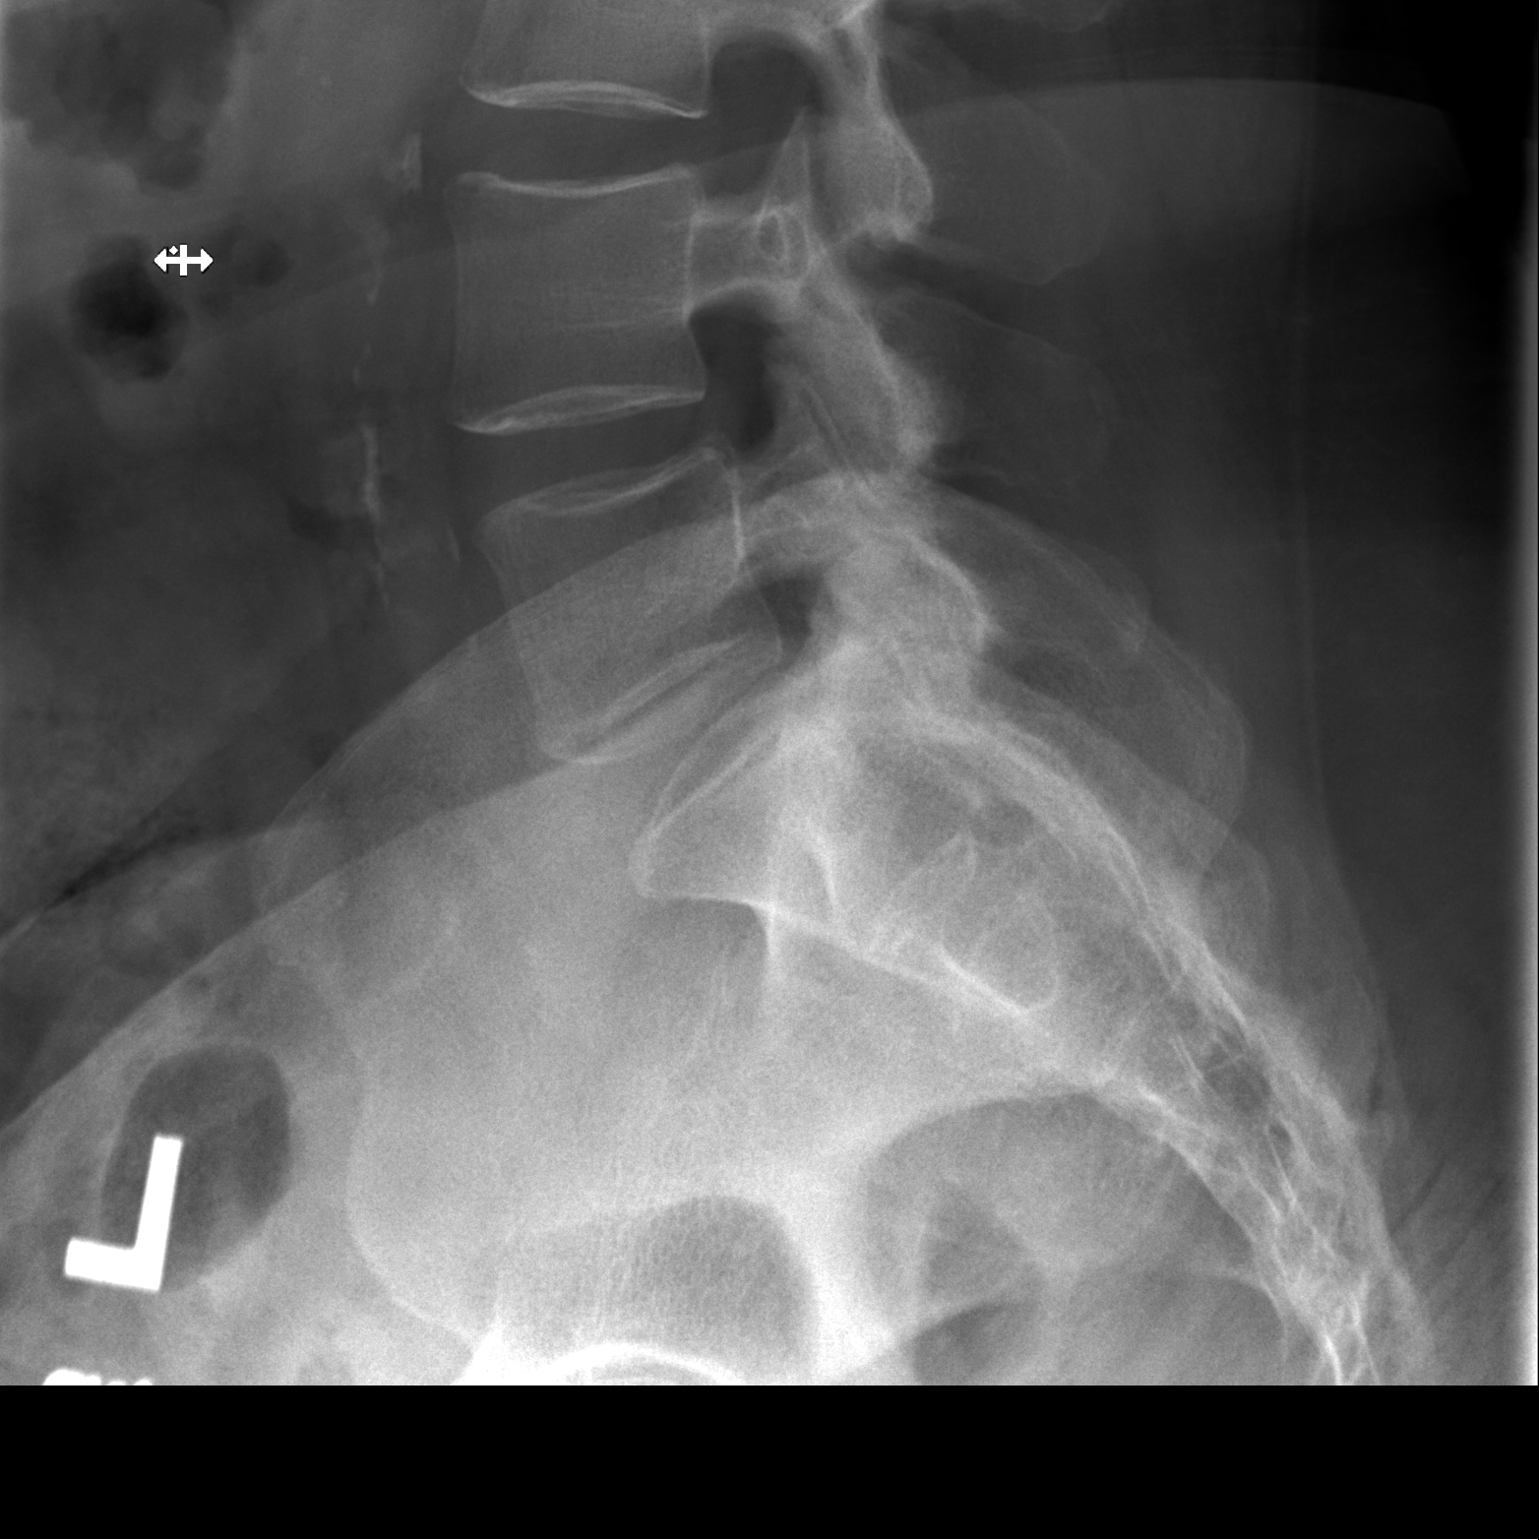

[3 of 3 positions shown; findings below may reference images not displayed]

FINDINGS: Rightward curvature of the lumbar spine. Preservation of the
vertebral body and intervertebral disc space heights. Lower lumbar
spine facet degenerative changes. Vascular calcifications. Mild
sclerosis right SI joint.
IMPRESSION: Mild lower lumbar spine degenerative changes.

## 2024-03-22 ENCOUNTER — Other Ambulatory Visit: Payer: Self-pay | Admitting: *Deleted

## 2024-03-22 ENCOUNTER — Other Ambulatory Visit: Payer: Self-pay

## 2024-03-22 NOTE — Patient Outreach (Signed)
 Complex Care Management   Visit Note  03/22/2024  Name:  Beth Barber MRN: [id number] DOB: 1965-12-18  Situation: Referral received for Complex Care Management related to SDOH Barriers:  Housing    asthma, hodgkin lymphoma and hypertension  I obtained verbal consent from Patient.  Visit completed with patient  on the phone   Moved on March 02 2024 in a 2 bed room town home- Peaceful and quiet- [place] [address] [place]- housing goal met   Still planning a visit to her family in [place]  (baptism) in the middle of August 2025   Voiced understanding related to her improved sedimentation rate (17 normal),   low MCV (79.9), low MCH (25.3)-abnormal & elevated RDW (16.9 abnormal) indicating iron deficiency,&  high BUN (22 abnormal) indicating possible dehydration, high protein diet, Heart failure or GI bleeding on 03/18/24   Background:   Past Medical History:  Diagnosis Date   Asthma    Cancer (HCC)    Complication of anesthesia    difficult to wake up after anesthesia   GERD (gastroesophageal reflux disease)    Hypertension     Assessment: Patient Reported Symptoms:  Cognitive Cognitive Status: No symptoms reported      Neurological Neurological Review of Symptoms: No symptoms reported Neurological Self-Management Outcome: 4 (good)  HEENT HEENT Symptoms Reported: No symptoms reported HEENT Self-Management Outcome: 4 (good)    Cardiovascular Cardiovascular Symptoms Reported: No symptoms reported Does patient have uncontrolled Hypertension?: No Cardiovascular Management Strategies: Activity, Medication therapy, Routine screening Cardiovascular Self-Management Outcome: 4 (good)  Respiratory Respiratory Symptoms Reported: No symptoms reported Respiratory Self-Management Outcome: 4 (good)  Endocrine Endocrine Symptoms Reported: No symptoms reported Is patient diabetic?: No Endocrine Self-Management Outcome: 4 (good)  Gastrointestinal Gastrointestinal Symptoms  Reported: Other, Constipation Other Gastrointestinal Symptoms: BUN on 03/18/24 = 22 (6-20) dehydration taking super foods that caused some constipation Gastrointestinal Management Strategies: Adequate rest, Medication therapy Gastrointestinal Self-Management Outcome: 4 (good)    Genitourinary Genitourinary Symptoms Reported: No symptoms reported Genitourinary Self-Management Outcome: 4 (good)  Integumentary Integumentary Symptoms Reported: Skin changes Additional Integumentary Details: ezcema hands Skin Management Strategies: Medication therapy Skin Self-Management Outcome: 4 (good)  Musculoskeletal Musculoskelatal Symptoms Reviewed: Joint pain Musculoskeletal Management Strategies: Adequate rest, Routine screening Musculoskeletal Self-Management Outcome: 4 (good) Falls in the past year?: No Number of falls in past year: 1 or less Was there an injury with Fall?: No Fall Risk Category Calculator: 0 Patient Fall Risk Level: Low Fall Risk Patient at Risk for Falls Due to: No Fall Risks Fall risk Follow up: Falls evaluation completed, Falls prevention discussed  Psychosocial Psychosocial Symptoms Reported: No symptoms reported Behavioral Health Self-Management Outcome: 4 (good)          03/22/2024    3:56 PM  Depression screen PHQ 2/9  Decreased Interest 0  Down, Depressed, Hopeless 0  PHQ - 2 Score 0    There were no vitals filed for this visit.  Medications Reviewed Today   Medications were not reviewed in this encounter     Recommendation:   PCP Follow-up Continue Current Plan of Care Maintaining hydration  Follow up with Oncology 03/25/24 & nutritionist 9/2/225  Follow Up Plan:   Telephone follow up appointment date/time:  04/22/24 3 pm  Beth Barber L. Ramonita, RN, BSN, CCM   [facility], [facility] Health RN Care Manager Direct Dial: [phone]  Fax: [phone]

## 2024-03-22 NOTE — Patient Instructions (Signed)
 Visit Information  Thank you for taking time to visit with me today. Please don't hesitate to contact me if I can be of assistance to you before our next scheduled appointment.  Your next care management appointment is by telephone on 04/22/24 at 3 pm  improved sedimentation rate (17 normal), low MCV (79.9), low MCH (25.3)-abnormal & elevated RDW (16.9 abnormal) ndicating iron deficiency,&  high BUN (22 abnormal) indicating possible dehydration, high protein diet, Heart failure or GI bleeding on 03/18/24   Please call the care guide team at [phone] if you need to cancel, schedule, or reschedule an appointment.   Please call the Suicide and Crisis Lifeline: 988 call the [place]  National Suicide Prevention Lifeline: 915-856-0354 or TTY: (762)046-4268 TTY 724-434-2469) to talk to a trained counselor call 1-800-273-TALK (toll free, 24 hour hotline) call the [facility]: ([phone] call 911 if you are experiencing a Mental Health or Behavioral Health Crisis or need someone to talk to.  Waneta Fitting L. Ramonita, RN, BSN, CCM [place]  [facility], [facility] Health RN Care Manager Direct Dial: [phone]  Fax: [phone]

## 2024-03-23 ENCOUNTER — Telehealth: Admitting: *Deleted

## 2024-03-25 ENCOUNTER — Other Ambulatory Visit: Payer: Self-pay

## 2024-03-25 ENCOUNTER — Inpatient Hospital Stay: Attending: Hematology | Admitting: Hematology

## 2024-03-25 VITALS — Ht 65.0 in | Wt 224.4 lb

## 2024-03-25 DIAGNOSIS — Z7189 Other specified counseling: Secondary | ICD-10-CM

## 2024-03-25 DIAGNOSIS — C8190 Hodgkin lymphoma, unspecified, unspecified site: Secondary | ICD-10-CM | POA: Insufficient documentation

## 2024-03-25 DIAGNOSIS — E611 Iron deficiency: Secondary | ICD-10-CM | POA: Diagnosis not present

## 2024-03-25 NOTE — Progress Notes (Signed)
 [facility] [address] [place] [address]    Clinic Day:  03/25/2024  Referring physician: Shona Norleen PEDLAR, MD  Patient Care Team: Shona Norleen PEDLAR, MD as PCP - General (Internal Medicine) Rogers Hai, MD as Medical Oncologist (Medical Oncology) Frances Ozell RAMAN, LCSW as [facility] Care Management (Licensed Clinical Social Worker) Joeann Browning, FNP (Nurse Practitioner) Ramonita Suzen CROME, RN as [facility] Care Management   ASSESSMENT & PLAN:   Assessment: 1.  Stage IIb Hodgkin's lymphoma: - Presentation with left supraclavicular lymphadenopathy. - Status post ABVD for 6 months completed in 25-Apr-2013. - Last CT CAP (07/25/2018): Negative for recurrent lymphoma - Last seen by Dr. Rosalea in [place]  on 03/01/2021.   2.  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  She had exposure to asbestos.  She recently moved to [place] in 2021-04-25.  She takes care of her sister.  She is a 1 pack/day smoker for 34 years, quit in 25-Apr-2020. - Mother died of colon cancer but also had throat cancer and breast cancer.  Father had cancer, type not known to the patient.  Brother had throat cancer.  Sister had?  Sarcoma of the thigh with metastasis to the lungs.    Plan: 1.  Stage IIb Hodgkin's lymphoma: - Denies any B symptoms or infections in the last 1 year. - Physical exam: Small shotty lymph nodes at the site of the previous biopsy in the left supraclavicular fossa.  No other new lymphadenopathy or splenomegaly. - Labs from 03/18/2024: Normal LFTs.  CBC was grossly normal.  LDH was normal. - She would like to continue follow-ups with us .  Will see her back in 1 year with physical exam and labs.  She was told to continue yearly mammograms.    Orders Placed This Encounter  Procedures   CBC with Differential/Platelet    Standing Status:   Future    Expected Date:   03/25/2025    Expiration Date:   06/23/2025    Release to  patient:   Immediate   Comprehensive metabolic panel with GFR    Standing Status:   Future    Expected Date:   03/25/2025    Expiration Date:   06/23/2025    Release to patient:   Immediate   Lactate dehydrogenase    Standing Status:   Future    Expected Date:   03/25/2025    Expiration Date:   06/23/2025    Release to patient:   Immediate   Ferritin    Standing Status:   Future    Expected Date:   03/25/2025    Expiration Date:   06/23/2025    Release to patient:   Immediate   Iron and TIBC    Standing Status:   Future    Expected Date:   03/25/2025    Expiration Date:   06/23/2025    Release to patient:   Immediate   Ambulatory referral to Social Work    Referral Priority:   Routine    Referral Type:   Consultation    Referral Reason:   Specialty Services Required    Number of Visits Requested:   1      I,Helena R Teague,acting as a Neurosurgeon for Hai Rogers, MD.,have documented all relevant documentation on the behalf of Hai Rogers, MD,as directed by  Hai Rogers, MD while in the presence of Hai Rogers, MD.  I, Hai Rogers MD, have reviewed  the above documentation for accuracy and completeness, and I agree with the above.    Alean Stands, MD   8/7/20254:27 PM  CHIEF COMPLAINT:   Diagnosis: history of stage IIb Hodgkin's lymphoma    Cancer Staging  No matching staging information was found for the patient.    Prior Therapy: ABVD, 6 months in 2014  Current Therapy:  surveillance   HISTORY OF PRESENT ILLNESS:   Oncology History   No history exists.     INTERVAL HISTORY:   Beth Barber is a 58 y.o. female presenting to clinic today for follow up of history of stage IIb Hodgkin's lymphoma. She was last seen by me on 04/16/2023.  Today, she states that she is doing well overall. Her appetite level is at 100%. Her energy level is at 100%. Kyiah denies any infections, fevers, or night sweats in the last 6 months. She does report  occasional hot flashes. Paysen was recommended to start iron supplements for low iron, though she is not taking them as the medication causes her constipation. She is instead attempting to eat foods high in iron.   Bexleigh was recently seen by Dr. Shona for right hip pain and had imaging of the area done. She has had no other medical issues since her last visit.  Natoria notes intermittent swelling of the left supraclavicular lymph nodes. She has a history of biopsy to these lymph nodes, which were found to be benign.   PAST MEDICAL HISTORY:   Past Medical History: Past Medical History:  Diagnosis Date   Asthma    Cancer (HCC)    Complication of anesthesia    difficult to wake up after anesthesia   GERD (gastroesophageal reflux disease)    Hypertension     Surgical History: Past Surgical History:  Procedure Laterality Date   ABDOMINAL HYSTERECTOMY     BIOPSY  10/15/2021   Procedure: BIOPSY;  Surgeon: Cindie Carlin POUR, DO;  [facility];  Service: Endoscopy;;   COLONOSCOPY WITH PROPOFOL  N/A 10/15/2021   Procedure: COLONOSCOPY WITH PROPOFOL ;  Surgeon: Cindie Carlin POUR, DO;  [facility];  Service: Endoscopy;  Laterality: N/A;  2:30 / ASA 3   POLYPECTOMY  10/15/2021   Procedure: POLYPECTOMY;  Surgeon: Cindie Carlin POUR, DO;  [facility];  Service: Endoscopy;;   PORTACATH PLACEMENT     portacath removal      Social History: Social History   Socioeconomic History   Marital status: Single    Spouse name: Not on file   Number of children: Not on file   Years of education: Not on file   Highest education level: Not on file  Occupational History   Not on file  Tobacco Use   Smoking status: Former    Current packs/day: 0.00    Types: Cigarettes    Quit date: 05/24/2020    Years since quitting: 3.8   Smokeless tobacco: Never  Vaping Use   Vaping status: Never Used  Substance and Sexual Activity   Alcohol use: Yes    Comment: Pt. states sometimes.    Drug use: Never   Sexual activity: Not Currently  Other Topics Concern   Not on file  Social History Narrative   Lives with sister, terrier dog    She takes care of her sister    Had 5 children   Social Drivers of Corporate investment banker Strain: Low Risk  (11/27/2023)   Overall Financial Resource Strain (CARDIA)  Difficulty of Paying Living Expenses: Not hard at all  Food Insecurity: No Food Insecurity (03/22/2024)   Hunger Vital Sign    Worried About Running Out of Food in the Last Year: Never true    Ran Out of Food in the Last Year: Never true  Transportation Needs: No Transportation Needs (03/22/2024)   PRAPARE - Administrator, Civil Service (Medical): No    Lack of Transportation (Non-Medical): No  Physical Activity: Inactive (11/12/2023)   Exercise Vital Sign    Days of Exercise per Week: 0 days    Minutes of Exercise per Session: 0 min  Stress: Stress Concern Present (09/15/2023)   [employer] of Occupational Health - Occupational Stress Questionnaire    Feeling of Stress : To some extent  Social Connections: Moderately Integrated (03/22/2024)   Social Connection and Isolation Panel    Frequency of Communication with Friends and Family: Three times a week    Frequency of Social Gatherings with Friends and Family: Three times a week    Attends Religious Services: 1 to 4 times per year    Active Member of Clubs or Organizations: Yes    Attends Banker Meetings: 1 to 4 times per year    Marital Status: Never married  Intimate Partner Violence: Not At Risk (03/22/2024)   Humiliation, Afraid, Rape, and Kick questionnaire    Fear of Current or Ex-Partner: No    Emotionally Abused: No    Physically Abused: No    Sexually Abused: No    Family History: Family History  Problem Relation Age of Onset   Cancer Mother    Cancer Father    Cancer Sister    Cancer Brother     Current Medications:  Current Outpatient Medications:    albuterol  (PROVENTIL) (2.5 MG/3ML) 0.083% nebulizer solution, USE 1 VIAL IN NEBULIZER THREE TIMES DAILY AS NEEDED, Disp: , Rfl:    albuterol (VENTOLIN HFA) 108 (90 Base) MCG/ACT inhaler, Inhale 1-2 puffs into the lungs every 6 (six) hours as needed for wheezing or shortness of breath., Disp: , Rfl:    albuterol (VENTOLIN HFA) 108 (90 Base) MCG/ACT inhaler, Inhale 2 puffs into the lungs every 4 (four) hours as needed. (Patient not taking: Reported on 02/03/2024), Disp: , Rfl:    amLODipine (NORVASC) 5 MG tablet, Take 1 tablet every day by oral route for 90 days. (Patient taking differently: Take 1 tablet every day by oral route for 90 days.), Disp: , Rfl:    amLODipine (NORVASC) 5 MG tablet, Take 5 mg by mouth daily., Disp: , Rfl:    amLODipine (NORVASC) 5 MG tablet, Take 5 mg by mouth daily., Disp: , Rfl:    Borage, Borago officinalis, (BORAGE OIL PO), Take 1 capsule by mouth daily. (Patient not taking: Reported on 02/03/2024), Disp: , Rfl:    Cholecalciferol (VITAMIN D) 50 MCG (2000 UT) CAPS, Take 4,000 Units by mouth daily., Disp: , Rfl:    clobetasol  ointment (TEMOVATE ) 0.05 %, APPLY A THIN LAYER TO THE AFEFCTED AREA TWICE DAILY (Patient not taking: Reported on 02/03/2024), Disp: , Rfl:    famotidine  (PEPCID ) 20 MG tablet, Take 1 tablet by mouth daily., Disp: , Rfl:    Flax Oil-Fish Oil-Borage Oil (FISH OIL-FLAX OIL-BORAGE OIL PO), Take by mouth. (Patient not taking: Reported on 02/03/2024), Disp: , Rfl:    Flax Oil-Fish Oil-Borage Oil CAPS, Take by oral route., Disp: , Rfl:    FLAX OIL-FISH OIL-BORAGE OIL PO, Take by oral  route., Disp: , Rfl:    halobetasol (ULTRAVATE) 0.05 % cream, APPLY CREAM TOPICALLY TO HANDS TWICE DAILY AS NEEDED. (NOT TO FACE, GROIN OR UNDERARMS), Disp: , Rfl:    lisinopril -hydrochlorothiazide  (ZESTORETIC ) 20-25 MG tablet, , Disp: , Rfl:    mometasone-formoterol (DULERA) 100-5 MCG/ACT AERO, Inhale 2 puffs twice a day by inhalation route. (Patient taking differently: Inhale 2 puffs twice a  day by inhalation route.), Disp: , Rfl:    mometasone-formoterol (DULERA) 100-5 MCG/ACT AERO, Inhale 2 puffs twice a day by inhalation route., Disp: , Rfl:    predniSONE  (DELTASONE ) 10 MG tablet, TAKE 6 TABLETS ON DAY 1 THEN TAKE 5 TABLETS ON DAY 2 THEN TAKE 4 TABLETS ON DAY 3 THEN TAKE 3 TABLETS ON DAY 4 THEN TAKE 2 TABLETS ON DAY 5 THEN TAKE 1 TABLET ON DAY 6. TAKE ALL DOSES WITH FOOD. (Patient not taking: Reported on 02/03/2024), Disp: , Rfl:    Roflumilast (ZORYVE) 0.15 % CREA, APPLY TO THE AFFECTED AREA(S) BY TOPICAL ROUTE ONCE DAILY (Patient not taking: Reported on 02/03/2024), Disp: , Rfl:    triamcinolone cream (KENALOG) 0.1 %, APPLY A THIN LAYER TO THE AFFECTED AREA(S) BY TOPICAL ROUTE 2 TIMES PER DAY (Patient not taking: Reported on 02/03/2024), Disp: , Rfl:    Allergies: Allergies  Allergen Reactions   Hydrocodone-Acetaminophen Hives    vicodin    REVIEW OF SYSTEMS:   Review of Systems  Constitutional:  Negative for chills, fatigue and fever.  HENT:   Negative for lump/mass, mouth sores, nosebleeds, sore throat and trouble swallowing.   Eyes:  Negative for eye problems.  Respiratory:  Negative for cough and shortness of breath.   Cardiovascular:  Negative for chest pain, leg swelling and palpitations.  Gastrointestinal:  Negative for abdominal pain, constipation, diarrhea, nausea and vomiting.  Endocrine: Positive for hot flashes (occasional).  Genitourinary:  Negative for bladder incontinence, difficulty urinating, dysuria, frequency, hematuria and nocturia.   Musculoskeletal:  Negative for arthralgias, back pain, flank pain, myalgias and neck pain.  Skin:  Negative for itching and rash.  Neurological:  Negative for dizziness, headaches and numbness.  Hematological:  Does not bruise/bleed easily.  Psychiatric/Behavioral:  Negative for depression, sleep disturbance and suicidal ideas. The patient is not nervous/anxious.   All other systems reviewed and are negative.    VITALS:    Blood pressure (P) 129/84, pulse (P) 74, temperature (P) 98 F (36.7 C), temperature source (P) Oral, resp. rate (P) 18, height 5' 5 (1.651 m), weight 224 lb 6.9 oz (101.8 kg), SpO2 (P) 98%.  Wt Readings from Last 3 Encounters:  03/25/24 224 lb 6.9 oz (101.8 kg)  02/03/24 224 lb (101.6 kg)  12/04/23 226 lb (102.5 kg)    Body mass index is 37.35 kg/m.  Performance status (ECOG): 1 - Symptomatic but completely ambulatory  PHYSICAL EXAM:   Physical Exam Vitals and nursing note reviewed. Exam conducted with a chaperone present.  Constitutional:      Appearance: Normal appearance.  Cardiovascular:     Rate and Rhythm: Normal rate and regular rhythm.     Pulses: Normal pulses.     Heart sounds: Normal heart sounds.  Pulmonary:     Effort: Pulmonary effort is normal.     Breath sounds: Normal breath sounds.  Abdominal:     Palpations: Abdomen is soft. There is no hepatomegaly, splenomegaly or mass.     Tenderness: There is no abdominal tenderness.  Musculoskeletal:     Right lower leg: No edema.  Left lower leg: No edema.  Lymphadenopathy:     Cervical: No cervical adenopathy.     Right cervical: No superficial, deep or posterior cervical adenopathy.    Left cervical: No superficial, deep or posterior cervical adenopathy.     Upper Body:     Right upper body: No supraclavicular or axillary adenopathy.     Left upper body: No supraclavicular or axillary adenopathy.     Comments: +shotty lymph nodes in the left supraclavicular area  Neurological:     General: No focal deficit present.     Mental Status: She is alert and oriented to person, place, and time.  Psychiatric:        Mood and Affect: Mood normal.        Behavior: Behavior normal.     LABS:      Latest Ref Rng & Units 03/18/2024    7:52 AM 04/09/2023    9:01 AM 04/08/2022    8:48 AM  CBC  WBC 4.0 - 10.5 K/uL 8.6  4.2  4.3   Hemoglobin 12.0 - 15.0 g/dL 87.5  86.8  87.2   Hematocrit 36.0 - 46.0 % 38.7   40.3  39.3   Platelets 150 - 400 K/uL 268  250  237       Latest Ref Rng & Units 03/18/2024    7:52 AM 04/09/2023    9:01 AM 04/08/2022    8:48 AM  CMP  Glucose 70 - 99 mg/dL 93  879  894   BUN 6 - 20 mg/dL 22  11  12    Creatinine 0.44 - 1.00 mg/dL 9.19  9.03  9.20   Sodium 135 - 145 mmol/L 140  136  136   Potassium 3.5 - 5.1 mmol/L 3.8  4.0  3.9   Chloride 98 - 111 mmol/L 108  105  104   CO2 22 - 32 mmol/L 24  22  25    Calcium 8.9 - 10.3 mg/dL 9.2  9.2  9.4   Total Protein 6.5 - 8.1 g/dL 7.4  7.9  8.1   Total Bilirubin 0.0 - 1.2 mg/dL 0.5  0.6  0.5   Alkaline Phos 38 - 126 U/L 52  56  51   AST 15 - 41 U/L 15  36  28   ALT 0 - 44 U/L 19  29  25       No results found for: CEA1, CEA / No results found for: CEA1, CEA No results found for: PSA1 No results found for: CAN199 No results found for: CAN125  No results found for: TOTALPROTELP, ALBUMINELP, A1GS, A2GS, BETS, BETA2SER, GAMS, MSPIKE, SPEI No results found for: TIBC, FERRITIN, IRONPCTSAT Lab Results  Component Value Date   LDH 136 03/18/2024   LDH 168 04/09/2023   LDH 144 04/08/2022     STUDIES:   No results found.

## 2024-03-25 NOTE — Patient Instructions (Addendum)
 [facility]  Discharge Instructions  You were seen and examined today by Dr. Rogers.  Dr. Rogers discussed your most recent lab work which revealed that everything looks good and stable.  Dr. Rogers wants you to start taking over the counter iron once weekly.   Follow-up as scheduled.    Thank you for choosing [facility] to provide your oncology and hematology care.   To afford each patient quality time with our provider, please arrive at least 15 minutes before your scheduled appointment time. You may need to reschedule your appointment if you arrive late (10 or more minutes). Arriving late affects you and other patients whose appointments are after yours.  Also, if you miss three or more appointments without notifying the office, you may be dismissed from the clinic at the provider's discretion.    Again, thank you for choosing [facility].  Our hope is that these requests will decrease the amount of time that you wait before being seen by our physicians.   If you have a lab appointment with the Cancer Center - please note that after April 8th, all labs will be drawn in the cancer center.  You do not have to check in or register with the main entrance as you have in the past but will complete your check-in at the cancer center.            _____________________________________________________________  Should you have questions after your visit to [facility], please contact our office at [phone] and follow the prompts.  Our office hours are 8:00 a.m. to 4:30 p.m. Monday - Thursday and 8:00 a.m. to 2:30 p.m. Friday.  Please note that voicemails left after 4:00 p.m. may not be returned until the following business day.  We are closed weekends and all major holidays.  You do have access to a nurse 24-7, just call the main number to the clinic [phone] and do not press any options, hold on the  line and a nurse will answer the phone.    For prescription refill requests, have your pharmacy contact our office and allow 72 hours.    Masks are no longer required in the cancer centers. If you would like for your care team to wear a mask while they are taking care of you, please let them know. You may have one support person who is at least 57 years old accompany you for your appointments.

## 2024-03-25 NOTE — Progress Notes (Signed)
 Patient requested information on advance care planning.  She was given a packet of information.  She is aware that we will make an appt with social worker to help her complete those forms.  Orders for referral to social work placed.

## 2024-03-31 ENCOUNTER — Inpatient Hospital Stay: Admitting: Licensed Clinical Social Worker

## 2024-03-31 DIAGNOSIS — Z7189 Other specified counseling: Secondary | ICD-10-CM

## 2024-03-31 NOTE — Progress Notes (Signed)
 [facility] CSW Progress Note  Clinical Social Worker contacted patient by phone to follow-up on advanced care planning.    Interventions: CSW spoke w/ pt about completing advanced directives.  Pt states she would like to schedule a time to complete advanced directives when she returns from vacation.        Follow Up Plan:  Patient will contact CSW with any support or resource needs    Devere JONELLE Manna, LCSW Clinical Social Worker [facility]

## 2024-04-07 ENCOUNTER — Other Ambulatory Visit: Payer: Self-pay

## 2024-04-07 ENCOUNTER — Encounter: Payer: Self-pay | Admitting: *Deleted

## 2024-04-07 NOTE — Patient Outreach (Signed)
 Incoming call from patient to get help to reschedule her appointments during the week of 04/19/24 to 04/26/24 as she is enjoying her family and would like to stay a little longer  She has already rescheduled her oncology appointment for 04/08/24 & 04/15/24 Dr Rogers Hai Needs nutrition visit with [facility] [phone] on 04/20/24 1100, appointment with pcp Rosaline Macadam [phone] on 04/21/24 0800 & appointment with RN CCM on 04/22/24 rescheduled  New appointments are  Pcp labs 04/30/24,  office visit 05/06/24 1100 Rosaline Macadam Nutritionist on 05/04/24 at 3:30 pm    Plan Follow up with patient on 05/11/24 1130 and/or She will call RN CCM when she and her sister return to [facility]  [place] L. Ramonita, RN, BSN, CCM   [facility], [facility] RN Care Manager Direct Dial: [phone]  Fax: [phone]

## 2024-04-07 NOTE — Patient Instructions (Addendum)
 Visit Information  Thank you for taking time to visit with me today. Please don't hesitate to contact me if I can be of assistance to you before our next scheduled appointment.  Your next care management appointment is by telephone on 05/11/24 at 1130  New appointments are --Pcp labs 04/30/24,  office visit 05/06/24 1100 [place] [phone] [place] [phone] on 05/04/24 at 3:30 pm  Please call the care guide team at [phone] if you need to cancel, schedule, or reschedule an appointment.   Please call the Suicide and Crisis Lifeline: 988 call the [place]  National Suicide Prevention Lifeline: 207-147-7578 or TTY: 706-705-7882 TTY 3463835767) to talk to a trained counselor call 1-800-273-TALK (toll free, 24 hour hotline) call the [facility]: (540)157-6774 call 911 if you are experiencing a Mental Health or Behavioral Health Crisis or need someone to talk to.  Vinicius Brockman L. Ramonita, RN, BSN, CCM [place]  [facility], [facility] RN Care Manager Direct Dial: [phone]  Fax: [phone]

## 2024-04-08 ENCOUNTER — Other Ambulatory Visit: Payer: 59

## 2024-04-15 ENCOUNTER — Ambulatory Visit: Payer: 59 | Admitting: Hematology

## 2024-04-20 ENCOUNTER — Ambulatory Visit: Admitting: Nutrition

## 2024-04-22 ENCOUNTER — Telehealth: Admitting: *Deleted

## 2024-05-03 ENCOUNTER — Other Ambulatory Visit: Payer: Self-pay

## 2024-05-03 ENCOUNTER — Telehealth: Admitting: *Deleted

## 2024-05-03 ENCOUNTER — Telehealth: Payer: Self-pay | Admitting: *Deleted

## 2024-05-03 NOTE — Patient Outreach (Signed)
 Incoming call from patient  Patient called in to RN CCM at [phone] to get assist in getting a new Nutritional appointment with P [facility] on 05/03/24 related to transportation Changed to 05/17/24 at 1130  Patient able to ventilate her feelings about her & her sister's 3 week visit to [place] to see her family and church members   She is enjoying her new apartment  Plan Outreach to patient on 05/11/24 as scheduled   Cassian Torelli L. Ramonita, RN, BSN, CCM [place]  [facility], [facility] RN Care Manager Direct Dial: [phone]  Fax: [phone]

## 2024-05-04 ENCOUNTER — Ambulatory Visit: Admitting: Nutrition

## 2024-05-07 ENCOUNTER — Emergency Department (HOSPITAL_COMMUNITY)
Admission: EM | Admit: 2024-05-07 | Discharge: 2024-05-07 | Disposition: A | Discharge status: Home / Self Care | Attending: Emergency Medicine | Admitting: Emergency Medicine

## 2024-05-07 ENCOUNTER — Emergency Department (HOSPITAL_COMMUNITY)

## 2024-05-07 ENCOUNTER — Other Ambulatory Visit: Payer: Self-pay

## 2024-05-07 ENCOUNTER — Encounter (HOSPITAL_COMMUNITY): Payer: Self-pay

## 2024-05-07 DIAGNOSIS — R079 Chest pain, unspecified: Secondary | ICD-10-CM

## 2024-05-07 DIAGNOSIS — K219 Gastro-esophageal reflux disease without esophagitis: Secondary | ICD-10-CM | POA: Insufficient documentation

## 2024-05-07 DIAGNOSIS — R0602 Shortness of breath: Secondary | ICD-10-CM | POA: Diagnosis not present

## 2024-05-07 DIAGNOSIS — R0789 Other chest pain: Secondary | ICD-10-CM | POA: Diagnosis not present

## 2024-05-07 LAB — BASIC METABOLIC PANEL WITH GFR
Anion gap: 8 (ref 5–15)
BUN: 14 mg/dL (ref 6–20)
CO2: 23 mmol/L (ref 22–32)
Calcium: 9.3 mg/dL (ref 8.9–10.3)
Chloride: 106 mmol/L (ref 98–111)
Creatinine, Ser: 0.82 mg/dL (ref 0.44–1.00)
GFR, Estimated: >60 mL/min (ref >60)
Glucose, Bld: 100 mg/dL — ABNORMAL HIGH (ref 70–99)
Potassium: 4 mmol/L (ref 3.5–5.1)
Sodium: 137 mmol/L (ref 135–145)

## 2024-05-07 LAB — CBC
HCT: 37.4 % (ref 36.0–46.0)
Hemoglobin: 12.3 g/dL (ref 12.0–15.0)
MCH: 26.2 pg (ref 26.0–34.0)
MCHC: 32.9 g/dL (ref 30.0–36.0)
MCV: 79.7 fL — ABNORMAL LOW (ref 80.0–100.0)
Platelets: 218 K/uL (ref 150–400)
RBC: 4.69 MIL/uL (ref 3.87–5.11)
RDW: 17.2 % — ABNORMAL HIGH (ref 11.5–15.5)
WBC: 4.7 K/uL (ref 4.0–10.5)
nRBC: 0 % (ref 0.0–0.2)

## 2024-05-07 LAB — TROPONIN I (HIGH SENSITIVITY): Troponin I (High Sensitivity): 2 ng/L (ref <18)

## 2024-05-07 MED ORDER — PANTOPRAZOLE SODIUM 40 MG IV SOLR
40.0000 mg | Freq: Once | INTRAVENOUS | Status: AC
  Administered 2024-05-07: 40 mg via INTRAVENOUS
  Filled 2024-05-07: qty 10

## 2024-05-07 MED ORDER — ASPIRIN 81 MG PO CHEW
324.0000 mg | CHEWABLE_TABLET | Freq: Once | ORAL | Status: AC
  Administered 2024-05-07: 324 mg via ORAL
  Filled 2024-05-07: qty 4

## 2024-05-07 MED ORDER — PANTOPRAZOLE SODIUM 40 MG PO TBEC: 40.0000 mg | DELAYED_RELEASE_TABLET | Freq: Every day | ORAL | 0 refills | Status: AC

## 2024-05-07 MED ORDER — FENTANYL CITRATE (PF) 100 MCG/2ML IJ SOLN
50.0000 ug | Freq: Once | INTRAMUSCULAR | Status: AC
  Administered 2024-05-07: 50 ug via INTRAVENOUS
  Filled 2024-05-07: qty 2

## 2024-05-07 NOTE — ED Triage Notes (Signed)
 Pt stated that she has been having on and off chest pain for 2 weeks that eventually went away but came back earlier today. Pt stated that when she turns certain ways it is worse. Pt stated that she has been slightly SOB as well since it has started

## 2024-05-07 NOTE — ED Provider Notes (Signed)
 [place] EMERGENCY DEPARTMENT AT [facility] Provider Note   CSN: [id number] Arrival date & time: 05/07/24  1841     Patient presents with: Chest Pain   Beth Barber is a 58 y.o. female.  She is here with complaint of pain across to her chest that started this morning.  Has been constant.  Worse with movement and palpation.  Feels a little short of breath.  Had a similar episode a few weeks ago.  No history of cardiac disease.  Does have a history of Hodgkin's lymphoma and also has had significant reflux history.  No fevers or chills.  No leg swelling or leg pain.  Has tried nothing for his symptoms.   The history is provided by the patient.  Chest Pain Pain location:  L chest and R chest Pain quality: aching and burning   Pain severity:  Moderate Onset quality:  Gradual Duration:  1 day Timing:  Constant Progression:  Unchanged Chronicity:  Recurrent Relieved by:  None tried Worsened by:  Movement Ineffective treatments:  None tried Associated symptoms: shortness of breath   Associated symptoms: no abdominal pain, no cough, no diaphoresis, no fever, no nausea and no vomiting   Risk factors: high cholesterol and hypertension   Risk factors: no coronary artery disease, no prior DVT/PE and no smoking        Prior to Admission medications   Medication Sig Start Date End Date Taking? Authorizing Provider  albuterol (PROVENTIL) (2.5 MG/3ML) 0.083% nebulizer solution USE 1 VIAL IN NEBULIZER THREE TIMES DAILY AS NEEDED 08/26/22   [provider]  albuterol (VENTOLIN HFA) 108 (90 Base) MCG/ACT inhaler Inhale 1-2 puffs into the lungs every 6 (six) hours as needed for wheezing or shortness of breath.    [provider]  albuterol (VENTOLIN HFA) 108 (90 Base) MCG/ACT inhaler Inhale 2 puffs into the lungs every 4 (four) hours as needed. Patient not taking: Reported on 02/03/2024 08/26/22   [provider]  amLODipine (NORVASC) 5 MG tablet Take 1 tablet  every day by oral route for 90 days. Patient taking differently: Take 1 tablet every day by oral route for 90 days. 12/04/22   [provider]  amLODipine (NORVASC) 5 MG tablet Take 5 mg by mouth daily. 05/23/23   [provider]  amLODipine (NORVASC) 5 MG tablet Take 5 mg by mouth daily.    [provider]  Borage, Borago officinalis, (BORAGE OIL PO) Take 1 capsule by mouth daily. Patient not taking: Reported on 02/03/2024    [provider]  Cholecalciferol (VITAMIN D) 50 MCG (2000 UT) CAPS Take 4,000 Units by mouth daily.    [provider]  clobetasol  ointment (TEMOVATE ) 0.05 % APPLY A THIN LAYER TO THE AFEFCTED AREA TWICE DAILY Patient not taking: Reported on 02/03/2024    [provider]  famotidine  (PEPCID ) 20 MG tablet Take 1 tablet by mouth daily. 09/12/22   [provider]  Flax Oil-Fish Oil-Borage Oil (FISH OIL-FLAX OIL-BORAGE OIL PO) Take by mouth. Patient not taking: Reported on 02/03/2024    [provider]  Flax Oil-Fish Oil-Borage Oil CAPS Take by oral route.    [provider]  FLAX OIL-FISH OIL-BORAGE OIL PO Take by oral route.    [provider]  halobetasol (ULTRAVATE) 0.05 % cream APPLY CREAM TOPICALLY TO HANDS TWICE DAILY AS NEEDED. (NOT TO FACE, GROIN OR UNDERARMS)    [provider]  lisinopril -hydrochlorothiazide  (ZESTORETIC ) 20-25 MG tablet  [provider]  mometasone-formoterol (DULERA) 100-5 MCG/ACT AERO Inhale 2 puffs twice a day by inhalation route. Patient taking differently: Inhale 2 puffs twice a day by inhalation route.    [provider]  mometasone-formoterol (DULERA) 100-5 MCG/ACT AERO Inhale 2 puffs twice a day by inhalation route.    [provider]  predniSONE  (DELTASONE ) 10 MG tablet TAKE 6 TABLETS ON DAY 1 THEN TAKE 5 TABLETS ON DAY 2 THEN TAKE 4 TABLETS ON DAY 3 THEN TAKE 3 TABLETS ON DAY 4 THEN TAKE 2 TABLETS ON DAY 5 THEN TAKE 1  TABLET ON DAY 6. TAKE ALL DOSES WITH FOOD. Patient not taking: Reported on 02/03/2024 05/05/23   [provider]  Roflumilast (ZORYVE) 0.15 % CREA APPLY TO THE AFFECTED AREA(S) BY TOPICAL ROUTE ONCE DAILY Patient not taking: Reported on 02/03/2024 09/26/23   [provider]  triamcinolone cream (KENALOG) 0.1 % APPLY A THIN LAYER TO THE AFFECTED AREA(S) BY TOPICAL ROUTE 2 TIMES PER DAY Patient not taking: Reported on 02/03/2024    [provider]    Allergies: Hydrocodone-acetaminophen    Review of Systems  Constitutional:  Negative for diaphoresis and fever.  Respiratory:  Positive for shortness of breath. Negative for cough.   Cardiovascular:  Positive for chest pain.  Gastrointestinal:  Negative for abdominal pain, nausea and vomiting.    Updated Vital Signs BP (!) 140/83 (BP Location: Right Arm)   Pulse 62   Temp 97.9 F (36.6 C) (Oral)   Resp 18   Ht 5' 6 (1.676 m)   Wt 102.1 kg   SpO2 100%   BMI 36.32 kg/m   Physical Exam Vitals and nursing note reviewed.  Constitutional:      General: She is not in acute distress.    Appearance: Normal appearance. She is well-developed.  HENT:     Head: Normocephalic and atraumatic.  Eyes:     Conjunctiva/sclera: Conjunctivae normal.  Cardiovascular:     Rate and Rhythm: Normal rate and regular rhythm.     Heart sounds: No murmur heard. Pulmonary:     Effort: Pulmonary effort is normal. No respiratory distress.     Breath sounds: Normal breath sounds. No stridor. No wheezing.  Chest:     Chest wall: Tenderness (diffuse) present.  Abdominal:     Palpations: Abdomen is soft.     Tenderness: There is no abdominal tenderness. There is no guarding or rebound.  Musculoskeletal:        General: No tenderness or deformity. Normal range of motion.     Cervical back: Neck supple.     Right lower leg: No tenderness. No edema.     Left lower leg: No tenderness. No edema.  Skin:    General: Skin is warm and dry.   Neurological:     General: No focal deficit present.     Mental Status: She is alert.     GCS: GCS eye subscore is 4. GCS verbal subscore is 5. GCS motor subscore is 6.     (all labs ordered are listed, but only abnormal results are displayed) Labs Reviewed  BASIC METABOLIC PANEL WITH GFR - Abnormal; Notable for the following components:      Result Value   Glucose, Bld 100 (*)    All other components within normal limits  CBC - Abnormal; Notable for the following components:   MCV 79.7 (*)    RDW 17.2 (*)    All other components within normal limits  TROPONIN I (HIGH SENSITIVITY)    EKG: EKG Interpretation Date/Time:  Friday May 07 2024 18:48:40 EDT Ventricular Rate:  67 PR Interval:  150 QRS Duration:  78 QT Interval:  394 QTC Calculation: 416 R Axis:   54  Text Interpretation: Sinus rhythm with sinus arrhythmia with occasional Premature ventricular complexes Cannot rule out Anterior infarct , age undetermined Abnormal ECG When compared with ECG of 12-Oct-2021 13:32, No significant change was found Confirmed by Towana Sharper [phone]) on 05/07/2024 6:59:41 PM  Radiology: [facility] Chest 2 View Result Date: 05/07/2024 EXAM: 2 VIEW(S) XRAY OF THE CHEST 05/07/2024 07:17:00 PM COMPARISON: 08/01/2022 CLINICAL HISTORY: CP. Pt stated that she has been having on and off chest pain for 2 weeks that eventually went away but came back earlier today. Pt stated that when she turns certain ways it is worse. Pt stated that she has been slightly SOB as well since it has started. FINDINGS: LUNGS AND PLEURA: No focal pulmonary opacity. No pulmonary edema. No pleural effusion. No pneumothorax. HEART AND MEDIASTINUM: No acute abnormality of the cardiac and mediastinal silhouettes. BONES AND SOFT TISSUES: No acute osseous abnormality. Surgical clips in left supraclavicular region. IMPRESSION: 1. No acute abnormalities. Electronically signed by: Pinkie Pebbles MD 05/07/2024 07:19 PM EDT RP  Workstation: [id number]     Procedures   Medications Ordered in the ED  aspirin  chewable tablet 324 mg (has no administration in time range)  fentaNYL  (SUBLIMAZE ) injection 50 mcg (has no administration in time range)  pantoprazole  (PROTONIX ) injection 40 mg (has no administration in time range)    Clinical Course as of 05/08/24 0926  Fri May 07, 2024  2120 She is feeling better and is comfortable plan for discharge.  Do not feel she needs 2 troponins as her symptoms been going on for many hours. [MB]    Clinical Course User Index [MB] Towana Sharper BROCKS, MD                                 Medical Decision Making Amount and/or Complexity of Data Reviewed Labs: ordered. Radiology: ordered.  Risk OTC drugs. Prescription drug management.   This patient complains of substernal chest pain; this involves an extensive number of treatment Options and is a complaint that carries with it a high risk of complications and morbidity. The differential includes ACS, pneumonia, pneumothorax, reflux, PE, vascular  I ordered, reviewed and interpreted labs, which included CBC normal chemistries normal troponin flat I ordered medication aspirin , pain medicine, IV PPI and reviewed PMP when indicated. I ordered imaging studies which included chest x-ray and I independently    visualized and interpreted imaging which showed no acute findings Previous records obtained and reviewed in epic including outpatient PCP notes Cardiac monitoring reviewed, sinus rhythm Social determinants considered, physically inactive Critical Interventions: None  After the interventions stated above, I reevaluated the patient and found patient to be feeling improved Admission and further testing considered, she does not feel she needs admission of the hospital at this time and I think that is reasonable.  Will start her on PPI.  Recommended close follow-up with PCP.  Return instructions discussed      Final  diagnoses:  Nonspecific chest pain  Gastroesophageal reflux disease, unspecified whether esophagitis present    ED Discharge Orders          Ordered    pantoprazole  (PROTONIX ) 40 MG tablet  Daily  05/07/24 2121               Towana Ozell BROCKS, MD 05/08/24 [phone]

## 2024-05-10 DIAGNOSIS — R7303 Prediabetes: Secondary | ICD-10-CM | POA: Diagnosis not present

## 2024-05-10 DIAGNOSIS — E785 Hyperlipidemia, unspecified: Secondary | ICD-10-CM | POA: Diagnosis not present

## 2024-05-10 DIAGNOSIS — R718 Other abnormality of red blood cells: Secondary | ICD-10-CM | POA: Diagnosis not present

## 2024-05-10 LAB — LAB REPORT - SCANNED
A1c: 6.1
Albumin, Urine POC: 35.9
Creatinine, POC: 146.8 mg/dL
EGFR: 82
Microalb Creat Ratio: 24

## 2024-05-11 ENCOUNTER — Other Ambulatory Visit: Payer: Self-pay | Admitting: *Deleted

## 2024-05-11 ENCOUNTER — Other Ambulatory Visit: Payer: Self-pay

## 2024-05-11 NOTE — Patient Instructions (Addendum)
 Visit Information  Thank you for taking time to visit with me today. Please don't hesitate to contact me if I can be of assistance to you before our next scheduled appointment.  Copy of your cardiac action plan which includes seeking emergency services for sudden unexplained chest discomfort that may radiate to her arms, back, neck or jaw, worsening chest pain with coughing up blood, that causes severe abdominal pain, that causes fainting, that causes shortness of breath, sweating, dizziness, nausea or vomiting., that causes the heart to beat too quickly or noted skips in beats  Your next care management appointment is by telephone on 06/08/24 at [facility] follow up with Hendricks Her RN CM   Please call the care guide team at [phone] if you need to cancel, schedule, or reschedule an appointment.   Please call the Suicide and Crisis Lifeline: 988 call the [place]  National Suicide Prevention Lifeline: 806 010 8111 or TTY: 930 159 7660 TTY [phone]) to talk to a trained counselor call 1-800-273-TALK (toll free, 24 hour hotline) call the [facility]: [phone] call 911 if you are experiencing a Mental Health or Behavioral Health Crisis or need someone to talk to.  Yalanda Soderman L. Ramonita, RN, BSN, CCM [place]  [facility], [facility] RN Care Manager Direct Dial: [phone]  Fax: [phone]

## 2024-05-11 NOTE — Patient Outreach (Signed)
 Complex Care Management   Visit Note  05/11/2024  Name:  Beth Barber MRN: [id number] DOB: 02-Jan-1966  Situation: Referral received for Complex Care Management related to SDOH Barriers:  Housing asthma, hodgkin lymphoma and hypertension I obtained verbal consent from Patient.  Visit completed with Patient  on the phone  She confirmed her visit to the ED on 04/30/24 recently for chest pain that was determined to be GERD (gastroesophageal reflux disease) She was commended for following her cardiac action plan to include seeking emergency care considering her family history and hodgkin's lymphoma diagnosis She confirms the Protonix  ordered at the ED has helped resolved her GERD  She will be attending her 05/14/24 pcp visit for ED follow up  She denies other medical concerns today She is working today with [employer] on her recent bill  She voices understanding of future outreach in October 2025 with new RN CM Hendricks Her   Background:   Past Medical History:  Diagnosis Date   Asthma    Cancer (HCC)    Complication of anesthesia    difficult to wake up after anesthesia   GERD (gastroesophageal reflux disease)    Hypertension     Assessment: Patient Reported Symptoms:  Cognitive Cognitive Status: No symptoms reported      Neurological Neurological Review of Symptoms: No symptoms reported Neurological Self-Management Outcome: 4 (good)  HEENT HEENT Symptoms Reported: No symptoms reported HEENT Self-Management Outcome: 4 (good)    Cardiovascular Cardiovascular Symptoms Reported: Chest pain or discomfort Does patient have uncontrolled Hypertension?: No Cardiovascular Management Strategies: Activity, Adequate rest, Medication therapy, Routine screening Cardiovascular Self-Management Outcome: 4 (good) Cardiovascular Comment: GERD determined during her 04/30/24 ED visit Protonix  has resolved her symptoms  Respiratory Respiratory Symptoms Reported: No symptoms reported Respiratory  Management Strategies: Activity, Adequate rest, Routine screening Respiratory Self-Management Outcome: 4 (good)  Endocrine Endocrine Symptoms Reported: No symptoms reported Is patient diabetic?: No Endocrine Self-Management Outcome: 4 (good)  Gastrointestinal Gastrointestinal Symptoms Reported: Reflux/heartburn Other Gastrointestinal Symptoms: 04/30/24 ED visit for non specific chest pain found to be GERD discharged with protonix  which she reports has resolved the symptoms Gastrointestinal Management Strategies: Activity, Adequate rest, Diet modification, Medication therapy Gastrointestinal Self-Management Outcome: 4 (good)    Genitourinary Genitourinary Symptoms Reported: No symptoms reported Genitourinary Management Strategies: Adequate rest Genitourinary Self-Management Outcome: 4 (good)  Integumentary Integumentary Symptoms Reported: Skin changes Additional Integumentary Details: 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 Skin Management Strategies: Routine screening, Medication therapy Skin Self-Management Outcome: 4 (good)  Musculoskeletal Musculoskelatal Symptoms Reviewed: Joint pain Musculoskeletal Management Strategies: Adequate rest, Activity, Exercise, Medication therapy, Routine screening Musculoskeletal Self-Management Outcome: 4 (good) Falls in the past year?: No Number of falls in past year: 1 or less Was there an injury with Fall?: No Fall Risk Category Calculator: 0 Patient Fall Risk Level: Low Fall Risk Patient at Risk for Falls Due to: No Fall Risks Fall risk Follow up: Falls evaluation completed  Psychosocial Psychosocial Symptoms Reported: No symptoms reported Behavioral Management Strategies: Adequate rest, Coping strategies, Support system Behavioral Health Self-Management Outcome: 4 (good) Major Change/Loss/Stressor/Fears (CP): Medical condition, family, Medical condition, self Techniques to Cope with Loss/Stress/Change: Diversional activities, Exercise, Meditation, Spiritual  practice(s) Quality of Family Relationships: helpful, supportive Do you feel physically threatened by others?: No    05/11/2024    PHQ2-9 Depression Screening   Little interest or pleasure in doing things Not at all  Feeling down, depressed, or hopeless Not at all  PHQ-2 - Total Score 0  Trouble falling or staying asleep, or sleeping  too much    Feeling tired or having little energy    Poor appetite or overeating     Feeling bad about yourself - or that you are a failure or have let yourself or your family down    Trouble concentrating on things, such as reading the newspaper or watching television    Moving or speaking so slowly that other people could have noticed.  Or the opposite - being so fidgety or restless that you have been moving around a lot more than usual    Thoughts that you would be better off dead, or hurting yourself in some way    PHQ2-9 Total Score    If you checked off any problems, how difficult have these problems made it for you to do your work, take care of things at home, or get along with other people    Depression Interventions/Treatment      There were no vitals filed for this visit.  Medications Reviewed Today   Medications were not reviewed in this encounter     Recommendation:   PCP Follow-up Continue Current Plan of Care  Follow Up Plan:   Telephone follow up appointment date/time:  06/08/24 @1130  with Hendricks Her, RN CM   Suzen L. Ramonita, RN, BSN, CCM [place]  [facility], [facility] Health RN Care Manager Direct Dial: [phone]  Fax: [phone]

## 2024-05-14 ENCOUNTER — Other Ambulatory Visit (HOSPITAL_COMMUNITY): Payer: Self-pay | Admitting: Nurse Practitioner

## 2024-05-14 DIAGNOSIS — Z87891 Personal history of nicotine dependence: Secondary | ICD-10-CM

## 2024-05-14 DIAGNOSIS — Z122 Encounter for screening for malignant neoplasm of respiratory organs: Secondary | ICD-10-CM

## 2024-05-17 ENCOUNTER — Encounter: Attending: Internal Medicine | Admitting: Nutrition

## 2024-05-17 ENCOUNTER — Encounter: Payer: Self-pay | Admitting: Nutrition

## 2024-05-17 VITALS — Ht 65.0 in | Wt 228.0 lb

## 2024-05-17 DIAGNOSIS — I10 Essential (primary) hypertension: Secondary | ICD-10-CM | POA: Insufficient documentation

## 2024-05-17 DIAGNOSIS — R7303 Prediabetes: Secondary | ICD-10-CM | POA: Insufficient documentation

## 2024-05-17 NOTE — Patient Instructions (Signed)
 Goals  Get back to walking 1 mile per day Drink only water-4-5 bottles per day Increase fruits, vegetables and whole grains.

## 2024-05-17 NOTE — Progress Notes (Signed)
 Medical Nutrition Therapy  Appointment Start time:  1125 Appointment End time: 1200 Primary concerns today: Unexplained weight gain, Pre Dm, Obesity  Referral diagnosis: R63.4, R74.03, E66.9 Preferred learning style: Listen and hear, see  Learning readiness: Ready   NUTRITION ASSESSMENT Pre Dm and Obesity Follow up A1C 6.1% still.  Hasn't been able to walk like she was.  Wt 228 lbs. Has moved and is working on getting back to walking daily. Started on Protonix  for reflux.   She is willing to continue to work on Lifestyle Medicine.    Clinical Medical Hx:  Past Medical History:  Diagnosis Date   Asthma    Cancer (HCC)    Complication of anesthesia    difficult to wake up after anesthesia   GERD (gastroesophageal reflux disease)    Hypertension     Medications: Current Outpatient Medications on File Prior to Visit  Medication Sig Dispense Refill   albuterol (PROVENTIL) (2.5 MG/3ML) 0.083% nebulizer solution USE 1 VIAL IN NEBULIZER THREE TIMES DAILY AS NEEDED     albuterol (VENTOLIN HFA) 108 (90 Base) MCG/ACT inhaler Inhale 1-2 puffs into the lungs every 6 (six) hours as needed for wheezing or shortness of breath.     albuterol (VENTOLIN HFA) 108 (90 Base) MCG/ACT inhaler Inhale 2 puffs into the lungs every 4 (four) hours as needed. (Patient not taking: Reported on 02/03/2024)     amLODipine (NORVASC) 5 MG tablet Take 1 tablet every day by oral route for 90 days. (Patient taking differently: Take 1 tablet every day by oral route for 90 days.)     amLODipine (NORVASC) 5 MG tablet Take 5 mg by mouth daily.     amLODipine (NORVASC) 5 MG tablet Take 5 mg by mouth daily.     Borage, Borago officinalis, (BORAGE OIL PO) Take 1 capsule by mouth daily. (Patient not taking: Reported on 02/03/2024)     Cholecalciferol (VITAMIN D) 50 MCG (2000 UT) CAPS Take 4,000 Units by mouth daily.     clobetasol  ointment (TEMOVATE ) 0.05 % APPLY A THIN LAYER TO THE AFEFCTED AREA TWICE DAILY (Patient not  taking: Reported on 02/03/2024)     famotidine  (PEPCID ) 20 MG tablet Take 1 tablet by mouth daily.     Flax Oil-Fish Oil-Borage Oil (FISH OIL-FLAX OIL-BORAGE OIL PO) Take by mouth. (Patient not taking: Reported on 02/03/2024)     Flax Oil-Fish Oil-Borage Oil CAPS Take by oral route.     FLAX OIL-FISH OIL-BORAGE OIL PO Take by oral route.     halobetasol (ULTRAVATE) 0.05 % cream APPLY CREAM TOPICALLY TO HANDS TWICE DAILY AS NEEDED. (NOT TO FACE, GROIN OR UNDERARMS)     lisinopril -hydrochlorothiazide  (ZESTORETIC ) 20-25 MG tablet      mometasone-formoterol (DULERA) 100-5 MCG/ACT AERO Inhale 2 puffs twice a day by inhalation route. (Patient taking differently: Inhale 2 puffs twice a day by inhalation route.)     mometasone-formoterol (DULERA) 100-5 MCG/ACT AERO Inhale 2 puffs twice a day by inhalation route.     pantoprazole  (PROTONIX ) 40 MG tablet Take 1 tablet (40 mg total) by mouth daily. 30 tablet 0   predniSONE  (DELTASONE ) 10 MG tablet TAKE 6 TABLETS ON DAY 1 THEN TAKE 5 TABLETS ON DAY 2 THEN TAKE 4 TABLETS ON DAY 3 THEN TAKE 3 TABLETS ON DAY 4 THEN TAKE 2 TABLETS ON DAY 5 THEN TAKE 1 TABLET ON DAY 6. TAKE ALL DOSES WITH FOOD. (Patient not taking: Reported on 02/03/2024)     Roflumilast (ZORYVE) 0.15 % CREA  APPLY TO THE AFFECTED AREA(S) BY TOPICAL ROUTE ONCE DAILY (Patient not taking: Reported on 02/03/2024)     triamcinolone cream (KENALOG) 0.1 % APPLY A THIN LAYER TO THE AFFECTED AREA(S) BY TOPICAL ROUTE 2 TIMES PER DAY (Patient not taking: Reported on 02/03/2024)     No current facility-administered medications on file prior to visit.     Labs:     Latest Ref Rng & Units 05/07/2024    7:36 PM 03/18/2024    7:52 AM 04/09/2023    9:01 AM  CMP  Glucose 70 - 99 mg/dL 899  93  879   BUN 6 - 20 mg/dL 14  22  11    Creatinine 0.44 - 1.00 mg/dL 9.17  9.19  9.03   Sodium 135 - 145 mmol/L 137  140  136   Potassium 3.5 - 5.1 mmol/L 4.0  3.8  4.0   Chloride 98 - 111 mmol/L 106  108  105   CO2 22 - 32  mmol/L 23  24  22    Calcium 8.9 - 10.3 mg/dL 9.3  9.2  9.2   Total Protein 6.5 - 8.1 g/dL  7.4  7.9   Total Bilirubin 0.0 - 1.2 mg/dL  0.5  0.6   Alkaline Phos 38 - 126 U/L  52  56   AST 15 - 41 U/L  15  36   ALT 0 - 44 U/L  19  29    Wt Readings from Last 3 Encounters:  05/17/24 228 lb (103.4 kg)  05/07/24 225 lb (102.1 kg)  03/25/24 224 lb 6.9 oz (101.8 kg)   Ht Readings from Last 3 Encounters:  05/17/24 5' 5 (1.651 m)  05/07/24 5' 6 (1.676 m)  03/25/24 5' 5 (1.651 m)   Body mass index is 37.94 kg/m. @BMIFA @ Facility age limit for growth %iles is 20 years. Facility age limit for growth %iles is 20 years.   Notable Signs/Symptoms: None  Lifestyle & Dietary Hx LIves by herself. Her sister lives with her. Usually eats 1 meal per day  Estimated daily fluid intake: 40 oz Supplements:  Sleep: poor Stress / self-care: a lot of stress Current average weekly physical activity: Not exercising but active in cleaning house   24-Hr Dietary Recall Cherrios and occassionally bacon. Trying to use Healthy Choice meals or cooking meals at home. Admits she needs to stop going to sheets and [employer] for meals. Drinks sparkling water   Estimated Energy Needs Calories: 1200 Carbohydrate: 135g Protein: 90g Fat: 33g   NUTRITION DIAGNOSIS  NI-1.7 Predicted excessive energy intake As related to inconsistent food intake from processed foods/high calorie diet.  As evidenced by 25 lb weight gain and BMI>30.   NUTRITION INTERVENTION  Nutrition education (E-1) on the following topics:  Nutrition and Pre- Diabetes education provided on My Plate, CHO counting, meal planning, portion sizes, timing of meals, avoiding snacks between meals unless having a low blood sugar, target ranges for A1C and blood sugars, signs/symptoms and treatment of hyper/hypoglycemia, monitoring blood sugars, taking medications as prescribed, benefits of exercising 30 minutes per day and prevention of  complications of DM.  Lifestyle Medicine  - Whole Food, Plant Predominant Nutrition is highly recommended: Eat Plenty of vegetables, Mushrooms, fruits, Legumes, Whole Grains, Nuts, seeds in lieu of processed meats, processed snacks/pastries red meat, poultry, eggs.    -It is better to avoid simple carbohydrates including: Cakes, Sweet Desserts, Ice Cream, Soda (diet and regular), Sweet Tea, Candies, Chips, Cookies, Store Bought Juices, Alcohol  in Excess of  1-2 drinks a day, Lemonade,  Artificial Sweeteners, Doughnuts, Coffee Creamers, Sugar-free Products, etc, etc.  This is not a complete list.....  Exercise: If you are able: 30 -60 minutes a day ,4 days a week, or 150 minutes a week.  The longer the better.  Combine stretch, strength, and aerobic activities.  If you were told in the past that you have high risk for cardiovascular diseases, you may seek evaluation by your heart doctor prior to initiating moderate to intense exercise programs.  Handouts Provided Include  Lifestyle Medicine handouts  Learning Style & Readiness for Change Teaching method utilized: Visual & Auditory  Demonstrated degree of understanding via: Teach Back  Barriers to learning/adherence to lifestyle change: None  Goals Established by Pt Get back to walking 1 mile per day Drink only water-4-5 bottles per day Increase fruits, vegetables and whole grains.  MONITORING & EVALUATION Dietary intake, weekly physical activity, and weight in 4 months.  Next Steps  Patient is to work on meal planning and meal planning.SABRA

## 2024-05-18 ENCOUNTER — Other Ambulatory Visit (HOSPITAL_COMMUNITY): Payer: Self-pay | Admitting: Internal Medicine

## 2024-05-18 DIAGNOSIS — Z1231 Encounter for screening mammogram for malignant neoplasm of breast: Secondary | ICD-10-CM

## 2024-05-26 ENCOUNTER — Ambulatory Visit (HOSPITAL_COMMUNITY)
Admission: RE | Admit: 2024-05-26 | Discharge: 2024-05-26 | Disposition: A | Discharge status: Home / Self Care | Source: Ambulatory Visit | Attending: Internal Medicine | Admitting: Internal Medicine

## 2024-05-26 ENCOUNTER — Encounter (HOSPITAL_COMMUNITY): Payer: Self-pay

## 2024-05-26 ENCOUNTER — Ambulatory Visit (HOSPITAL_COMMUNITY)
Admission: RE | Admit: 2024-05-26 | Discharge: 2024-05-26 | Disposition: A | Discharge status: Home / Self Care | Source: Ambulatory Visit | Attending: Family Medicine | Admitting: Family Medicine

## 2024-05-26 DIAGNOSIS — M8580 Other specified disorders of bone density and structure, unspecified site: Secondary | ICD-10-CM | POA: Insufficient documentation

## 2024-05-26 DIAGNOSIS — M8589 Other specified disorders of bone density and structure, multiple sites: Secondary | ICD-10-CM | POA: Insufficient documentation

## 2024-05-26 DIAGNOSIS — Z1231 Encounter for screening mammogram for malignant neoplasm of breast: Secondary | ICD-10-CM | POA: Insufficient documentation

## 2024-05-26 DIAGNOSIS — Z780 Asymptomatic menopausal state: Secondary | ICD-10-CM | POA: Diagnosis not present

## 2024-05-26 DIAGNOSIS — Z13820 Encounter for screening for osteoporosis: Secondary | ICD-10-CM | POA: Diagnosis not present

## 2024-05-26 DIAGNOSIS — M8588 Other specified disorders of bone density and structure, other site: Secondary | ICD-10-CM | POA: Diagnosis not present

## 2024-06-01 ENCOUNTER — Ambulatory Visit (HOSPITAL_COMMUNITY)
Admission: RE | Admit: 2024-06-01 | Discharge: 2024-06-01 | Disposition: A | Discharge status: Home / Self Care | Source: Ambulatory Visit | Attending: Nurse Practitioner | Admitting: Nurse Practitioner

## 2024-06-01 DIAGNOSIS — Z87891 Personal history of nicotine dependence: Secondary | ICD-10-CM | POA: Insufficient documentation

## 2024-06-01 DIAGNOSIS — Z122 Encounter for screening for malignant neoplasm of respiratory organs: Secondary | ICD-10-CM | POA: Insufficient documentation

## 2024-06-08 ENCOUNTER — Other Ambulatory Visit (HOSPITAL_COMMUNITY): Payer: Self-pay | Admitting: Nurse Practitioner

## 2024-06-08 ENCOUNTER — Other Ambulatory Visit: Payer: Self-pay

## 2024-06-08 DIAGNOSIS — E041 Nontoxic single thyroid nodule: Secondary | ICD-10-CM

## 2024-06-08 NOTE — Patient Instructions (Signed)
 Visit Information  Beth Barber was given information about Medicaid Managed Care team care coordination services as a part of their [facility] Community Plan Medicaid benefit.   If you would like to schedule transportation through your [facility], please call the following number at least 2 days in advance of your appointment: [phone]   Rides for urgent appointments can also be made after hours by calling Member Services.  Call the Behavioral Health Crisis Line at [phone], at any time, 24 hours a day, 7 days a week. If you are in danger or need immediate medical attention call 911.  Please see education materials related to GERD provided by MyChart link.  Patient verbalizes understanding of instructions and care plan provided today and agrees to view in MyChart. Active MyChart status and patient understanding of how to access instructions and care plan via MyChart confirmed with patient.     Telephone follow up appointment with Managed Medicaid care management team member scheduled for: 07-07-2024 at 11:00 AM   Hendricks Her RN, BSN  [place] VBCI-Population Health RN Case Manager   Direct [phone]   Following is a copy of your plan of care:  There are no care plans that you recently modified to display for this patient.

## 2024-06-09 ENCOUNTER — Other Ambulatory Visit (HOSPITAL_COMMUNITY): Payer: Self-pay | Admitting: Nurse Practitioner

## 2024-06-09 DIAGNOSIS — E041 Nontoxic single thyroid nodule: Secondary | ICD-10-CM

## 2024-06-09 DIAGNOSIS — R221 Localized swelling, mass and lump, neck: Secondary | ICD-10-CM

## 2024-06-09 DIAGNOSIS — E079 Disorder of thyroid, unspecified: Secondary | ICD-10-CM | POA: Diagnosis not present

## 2024-06-15 ENCOUNTER — Ambulatory Visit (HOSPITAL_COMMUNITY)
Admission: RE | Admit: 2024-06-15 | Discharge: 2024-06-15 | Disposition: A | Discharge status: Home / Self Care | Source: Ambulatory Visit | Attending: Nurse Practitioner | Admitting: Nurse Practitioner

## 2024-06-15 DIAGNOSIS — E041 Nontoxic single thyroid nodule: Secondary | ICD-10-CM | POA: Insufficient documentation

## 2024-06-21 ENCOUNTER — Other Ambulatory Visit: Payer: Self-pay | Admitting: Nurse Practitioner

## 2024-06-21 DIAGNOSIS — E079 Disorder of thyroid, unspecified: Secondary | ICD-10-CM

## 2024-07-07 ENCOUNTER — Other Ambulatory Visit: Payer: Self-pay

## 2024-07-07 NOTE — Patient Instructions (Signed)
 Visit Information  Ms. Beth Barber was given information about Medicaid Managed Care team care coordination services as a part of their [facility] Community Plan Medicaid benefit.   If you would like to schedule transportation through your [facility], please call the following number at least 2 days in advance of your appointment: [phone]   Rides for urgent appointments can also be made after hours by calling Member Services.  Call the Behavioral Health Crisis Line at [phone], at any time, 24 hours a day, 7 days a week. If you are in danger or need immediate medical attention call 911.  Please see education materials related to GERD provided by MyChart link.  Care plan and visit instructions communicated with the patient verbally today. Patient agrees to receive a copy in MyChart. Active MyChart status and patient understanding of how to access instructions and care plan via MyChart confirmed with patient.     Telephone follow up appointment with Managed Medicaid care management team member scheduled for: 08-09-2024  Hendricks Her RN, BSN  [place] I VBCI-Population Health RN Case Manager   Direct [phone]   Following is a copy of your plan of care:  There are no care plans that you recently modified to display for this patient.

## 2024-07-09 ENCOUNTER — Other Ambulatory Visit (HOSPITAL_COMMUNITY)
Admission: RE | Admit: 2024-07-09 | Discharge: 2024-07-09 | Disposition: A | Discharge status: Home / Self Care | Source: Ambulatory Visit | Attending: Nurse Practitioner | Admitting: Nurse Practitioner

## 2024-07-09 ENCOUNTER — Ambulatory Visit
Admission: RE | Admit: 2024-07-09 | Discharge: 2024-07-09 | Disposition: A | Discharge status: Home / Self Care | Source: Ambulatory Visit | Attending: Nurse Practitioner | Admitting: Nurse Practitioner

## 2024-07-09 DIAGNOSIS — E079 Disorder of thyroid, unspecified: Secondary | ICD-10-CM

## 2024-07-13 LAB — CYTOLOGY - NON PAP

## 2024-08-09 ENCOUNTER — Telehealth: Payer: Self-pay

## 2024-08-09 NOTE — Patient Instructions (Signed)
 Earnie Stager - I am sorry I was unable to reach you today for our scheduled appointment. I work with Shona, Norleen PEDLAR, MD and am calling to support your healthcare needs. Please contact me at [phone] at your earliest convenience. I look forward to speaking with you soon.   Thank you,  Hendricks Her RN, BSN  [place] I VBCI-Population Health RN Case Manager   Direct [phone]

## 2024-08-17 ENCOUNTER — Other Ambulatory Visit: Payer: Self-pay

## 2024-08-17 NOTE — Patient Outreach (Signed)
 Complex Care Management   Visit Note  08/17/2024  Name:  Beth Barber MRN: [id number] DOB: 11/30/1965  Situation: Referral received for Complex Care Management related to obesity I obtained verbal consent from Patient.  Visit completed with Patient  on the phone  Background:   Past Medical History:  Diagnosis Date   Asthma    Cancer (HCC)    Complication of anesthesia    difficult to wake up after anesthesia   GERD (gastroesophageal reflux disease)    Hypertension     Assessment: Patient Reported Symptoms:  Cognitive Cognitive Status: Alert and oriented to person, place, and time Cognitive/Intellectual Conditions Management [RPT]: None reported or documented in medical history or problem list   Health Maintenance Behaviors: Annual physical exam Healing Pattern: Average Health Facilitated by: Prayer/meditation, Rest  Neurological Neurological Review of Symptoms: No symptoms reported Neurological Management Strategies: Adequate rest, Routine screening Neurological Self-Management Outcome: 4 (good)  HEENT HEENT Symptoms Reported: No symptoms reported HEENT Management Strategies: Coping strategies, Routine screening HEENT Self-Management Outcome: 4 (good)    Cardiovascular Cardiovascular Symptoms Reported: Swelling in legs or feet Does patient have uncontrolled Hypertension?: No Cardiovascular Management Strategies: Adequate rest, Coping strategies Weight: 233 lb (105.7 kg) Cardiovascular Self-Management Outcome: 4 (good)  Respiratory Respiratory Symptoms Reported: Shortness of breath Other Respiratory Symptoms: after steps Respiratory Management Strategies: Adequate rest, Routine screening Respiratory Self-Management Outcome: 4 (good)  Endocrine Endocrine Symptoms Reported: No symptoms reported Is patient diabetic?: No Endocrine Self-Management Outcome: 4 (good)  Gastrointestinal Gastrointestinal Symptoms Reported: No symptoms reported Other Gastrointestinal Symptoms:  constipation has sbsided Gastrointestinal Management Strategies: Adequate rest, Activity, Diet modification Gastrointestinal Self-Management Outcome: 4 (good) Gastrointestinal Comment: drinks 2 16 ox bottles    Genitourinary Genitourinary Symptoms Reported: Frequency Genitourinary Management Strategies: Adequate rest, Activity, Coping strategies Genitourinary Self-Management Outcome: 4 (good)  Integumentary Integumentary Symptoms Reported: Sweating Additional Integumentary Details: excessive sweating'exzema Skin Management Strategies: Adequate rest, Coping strategies, Medication therapy  Musculoskeletal Musculoskelatal Symptoms Reviewed: No symptoms reported, Other Other Musculoskeletal Symptoms: Get tired real quick when I walk far Musculoskeletal Management Strategies: Adequate rest, Coping strategies Musculoskeletal Self-Management Outcome: 4 (good) Falls in the past year?: No Number of falls in past year: 1 or less Was there an injury with Fall?: No Fall Risk Category Calculator: 0 Patient Fall Risk Level: Low Fall Risk Patient at Risk for Falls Due to: No Fall Risks Fall risk Follow up: Falls evaluation completed, Education provided, Falls prevention discussed  Psychosocial Psychosocial Symptoms Reported: No symptoms reported Behavioral Management Strategies: Adequate rest Behavioral Health Self-Management Outcome: 4 (good) Major Change/Loss/Stressor/Fears (CP): Denies Techniques to Cope with Loss/Stress/Change: Not applicable Quality of Family Relationships: helpful, supportive, involved Do you feel physically threatened by others?: No    08/17/2024    PHQ2-9 Depression Screening   Little interest or pleasure in doing things Not at all  Feeling down, depressed, or hopeless Not at all  PHQ-2 - Total Score 0  Trouble falling or staying asleep, or sleeping too much    Feeling tired or having little energy    Poor appetite or overeating     Feeling bad about yourself - or  that you are a failure or have let yourself or your family down    Trouble concentrating on things, such as reading the newspaper or watching television    Moving or speaking so slowly that other people could have noticed.  Or the opposite - being so fidgety or restless that you have been moving around a lot more than  usual    Thoughts that you would be better off dead, or hurting yourself in some way    PHQ2-9 Total Score    If you checked off any problems, how difficult have these problems made it for you to do your work, take care of things at home, or get along with other people    Depression Interventions/Treatment      Today's Vitals   08/17/24 1036  Weight:   Height: 5' 6 (1.676 m)   Pain Scale: 0-10 Pain Score: 0-No pain  Medications Reviewed Today     Reviewed by Kay Hendricks MATSU, RN (Case Manager) on 08/17/24 at 1031  Med List Status: <None>   Medication Order Taking? Sig Documenting Provider Last Dose Status Informant  albuterol (PROVENTIL) (2.5 MG/3ML) 0.083% nebulizer solution [id number] Yes USE 1 VIAL IN NEBULIZER THREE TIMES DAILY AS NEEDED [provider]  Active   albuterol (VENTOLIN HFA) 108 (90 Base) MCG/ACT inhaler [id number] Yes Inhale 1-2 puffs into the lungs every 6 (six) hours as needed for wheezing or shortness of breath. [provider]  Active Self  albuterol (VENTOLIN HFA) 108 (90 Base) MCG/ACT inhaler [id number]  Inhale 2 puffs into the lungs every 4 (four) hours as needed.  Patient not taking: Reported on 08/17/2024   [provider]  Active   amLODipine (NORVASC) 5 MG tablet [id number] Yes Take 1 tablet every day by oral route for 90 days. [provider]  Active   amLODipine (NORVASC) 5 MG tablet [id number]  Take 5 mg by mouth daily.  Patient not taking: Reported on 08/17/2024   [provider]  Active   amLODipine (NORVASC) 5 MG tablet [id number]  Take 5 mg by mouth daily.  Patient not taking: Reported on  08/17/2024   [provider]  Active   Pecolia Heritage officinalis, (BORAGE OIL PO) [id number]  Take 1 capsule by mouth daily.  Patient not taking: Reported on 08/17/2024   [provider]  Consider Medication Status and Discontinue Self  Cholecalciferol (VITAMIN D) 50 MCG (2000 UT) CAPS [id number] Yes Take 4,000 Units by mouth daily. [provider]  Active Self  clobetasol  ointment (TEMOVATE ) 0.05 % [id number] Yes APPLY A THIN LAYER TO THE AFEFCTED AREA TWICE DAILY [provider]  Active   famotidine  (PEPCID ) 20 MG tablet [id number] Yes Take 1 tablet by mouth daily. [provider]  Active   Flax Oil-Fish Oil-Borage Oil (FISH OIL-FLAX OIL-BORAGE OIL PO) [id number]  Take by mouth.  Patient not taking: Reported on 08/17/2024   [provider]  Consider Medication Status and Discontinue            Med Note DORI, DAVINA E   Mon Sep 02, 2022  9:27 AM) Patient states she takes every other day  Flax Oil-Fish Oil-Borage Oil CAPS [id number]  Take by oral route.  Patient not taking: Reported on 08/17/2024   [provider]  Consider Medication Status and Discontinue   FLAX OIL-FISH OIL-BORAGE OIL PO [id number]  Take by oral route.  Patient not taking: Reported on 08/17/2024   [provider]  Consider Medication Status and Discontinue   halobetasol (ULTRAVATE) 0.05 % cream [id number] Yes APPLY CREAM TOPICALLY TO HANDS TWICE DAILY AS NEEDED. (NOT TO FACE, GROIN OR UNDERARMS) [provider]  Active   lisinopril -hydrochlorothiazide  (ZESTORETIC ) 20-25 MG tablet [id number]    Patient not taking: Reported on 08/17/2024   [provider]  Consider Medication Status and Discontinue   mometasone-formoterol (DULERA)  100-5 MCG/ACT AERO [id number]  Inhale 2 puffs twice a day by inhalation route.  Patient not taking: Reported on 08/17/2024   [provider]  Consider Medication Status and Discontinue    mometasone-formoterol (DULERA) 100-5 MCG/ACT AERO [id number]  Inhale 2 puffs twice a day by inhalation route.  Patient not taking: Reported on 08/17/2024   [provider]  Consider Medication Status and Discontinue   pantoprazole  (PROTONIX ) 40 MG tablet [id number] Yes Take 1 tablet (40 mg total) by mouth daily. Towana Ozell BROCKS, MD  Active   predniSONE  (DELTASONE ) 10 MG tablet [id number]  TAKE 6 TABLETS ON DAY 1 THEN TAKE 5 TABLETS ON DAY 2 THEN TAKE 4 TABLETS ON DAY 3 THEN TAKE 3 TABLETS ON DAY 4 THEN TAKE 2 TABLETS ON DAY 5 THEN TAKE 1 TABLET ON DAY 6. TAKE ALL DOSES WITH FOOD.  Patient not taking: Reported on 08/17/2024   [provider]  Consider Medication Status and Discontinue   Roflumilast (ZORYVE) 0.15 % CREA [id number]  APPLY TO THE AFFECTED AREA(S) BY TOPICAL ROUTE ONCE DAILY  Patient not taking: Reported on 08/17/2024   [provider]  Consider Medication Status and Discontinue   triamcinolone cream (KENALOG) 0.1 % [id number]  APPLY A THIN LAYER TO THE AFFECTED AREA(S) BY TOPICAL ROUTE 2 TIMES PER DAY  Patient not taking: Reported on 08/17/2024   [provider]  Consider Medication Status and Discontinue             Recommendation:   Continue Current Plan of Care  Follow Up Plan:   Telephone follow-up in 1 month  .mys

## 2024-08-17 NOTE — Patient Instructions (Signed)
 Visit Information  Ms. Bardwell was given information about Medicaid Managed Care team care coordination services as a part of their [facility] Community Plan Medicaid benefit.   If you would like to schedule transportation through your [facility], please call the following number at least 2 days in advance of your appointment: [phone]   Rides for urgent appointments can also be made after hours by calling Member Services.  Call the Behavioral Health Crisis Line at [phone], at any time, 24 hours a day, 7 days a week. If you are in danger or need immediate medical attention call 911.  Please see education materials related to obesity provided by MyChart link.  Care plan and visit instructions communicated with the patient verbally today. Patient agrees to receive a copy in MyChart. Active MyChart status and patient understanding of how to access instructions and care plan via MyChart confirmed with patient.     Telephone follow up appointment with Managed Medicaid care management team member scheduled for:09/17/2024 at 11:00 AM   Hendricks Her RN, BSN  [place] I VBCI-Population Health RN Case Manager   Direct [phone]   Following is a copy of your plan of care:   Goals Addressed             This Visit's Progress    COMPLETED: Home management of GERD -VBCI RN Care Plan       Problems:  Chronic Disease Management support and education needs related to GERD History of Hodgkin's Lymphoma- chest pain is a worsening symptoms  Goal: Over the next 3  months the Patient will demonstrate Improved health management independence as evidenced by report of no further chest pain in 3 months and no ED/hospital visit in 3 months (EHR)         UPDATE 07-07-2024 No Hospital Admissions in 3 months   Patient has completed goal 08/17/2024  Interventions:   Evaluation of current treatment plan related to GERD, home diet self-management and patient's  adherence to plan as established by provider. Discussed plans with patient for ongoing care management follow up and provided patient with direct contact information for care management team Evaluation of current treatment plan related to GERD and patient's adherence to plan as established by provider  Patient Self-Care Activities:  Attend all scheduled provider appointments Call pharmacy for medication refills 3-7 days in advance of running out of medications Call provider office for new concerns or questions  Take medications as prescribed   Review education provided on chest pain & GERD Continue to follow her cardiac action plan which includes seeking emergency services for sudden unexplained chest discomfort that may radiate to her arms, back, neck or jaw, worsening chest pain with coughing up blood, that causes severe abdominal pain, that causes fainting, that causes shortness of breath, sweating, dizziness, nausea or vomiting., that causes the heart to beat too quickly or noted skips in beats  Plan:  Telephone follow up appointment with care management team member scheduled for:  08-09-2024 1:00 PM with Hendricks Her RN CM          VBCI RN Care Plan   On track    Problems:  Chronic Disease Management support and education needs related to Obesity   Goal: Over the next 90 days the Patient will attend all scheduled medical appointments: with providers as evidenced by encounter notes for appointments in EMR         demonstrate ongoing self health care management ability of obesity  as evidenced by  daily weighing and keeping a food and activity log   not experience hospital admission as evidenced by review of electronic medical record. Hospital Admissions in last 6 months = 0 take all medications exactly as prescribed and will call provider for medication related questions as evidenced by medication adherence and communication with provider for medication related questions or concerns      verbalize understanding of plan for management of obesity as evidenced by explaining in own words what diet plan and exercise routine is (Calories in vs calories burned)  Interventions:   Evaluation of current treatment plan related to obesity,  self-management and patient's adherence to plan as established by provider. Discussed plans with patient for ongoing care management follow up and provided patient with direct contact information for care management team Evaluation of current treatment plan related to obesity and patient's adherence to plan as established by provider Provided education to patient re: Health Diet and Exercise options Reviewed medications with patient and discussed importance of calling provider ofr any questions or concerns Provided patient with   educational materials related to Obesity and Weight loss  Discussed plans with patient for ongoing care management follow up and provided patient with direct contact information for care management team Screening for signs and symptoms of depression related to chronic disease state  Assessed social determinant of health barriers  Patient Self-Care Activities:  Attend all scheduled provider appointments Call pharmacy for medication refills 3-7 days in advance of running out of medications Call provider office for new concerns or questions  Notify RN Care Manager of [facility] call rescheduling needs Perform all self care activities independently  Take medications as prescribed    Plan:  Telephone follow up appointment with care management team member scheduled for:  09/17/2024 at 11:00 AM

## 2024-08-25 ENCOUNTER — Encounter: Payer: Self-pay | Admitting: Cardiology

## 2024-08-25 ENCOUNTER — Ambulatory Visit: Attending: Cardiology | Admitting: Cardiology

## 2024-08-25 VITALS — BP 120/72 | HR 78 | Ht 66.0 in | Wt 235.0 lb

## 2024-08-25 DIAGNOSIS — I288 Other diseases of pulmonary vessels: Secondary | ICD-10-CM

## 2024-08-25 DIAGNOSIS — R0602 Shortness of breath: Secondary | ICD-10-CM

## 2024-08-25 NOTE — Progress Notes (Signed)
 "     Clinical Summary Beth Barber is a 59 y.o.female seen today as a new consult for the following medical problems  1.Enlarged pulmonary artery - 05/2024 CT chest: enlarted pulmonary trunk, suggesting pulmonary HTN - tobacco history, smoked roughly 35 years. Quit in 2020 - chronic SOB with walking up stairs at her apartment.  - 03/2022 205 lbs, today in clinic 235 lbs.  - snores at night, unsure of apneic episodes, daytime somnolence though doesn't sleep well in general  - no known autoimmune history for her or family.   2.Hodgkin's lymphoma -she reports cancer free since 2014   Past Medical History:  Diagnosis Date   Asthma    Cancer (HCC)    Complication of anesthesia    difficult to wake up after anesthesia   GERD (gastroesophageal reflux disease)    Hypertension      Allergies[1]   Current Outpatient Medications  Medication Sig Dispense Refill   albuterol (PROVENTIL) (2.5 MG/3ML) 0.083% nebulizer solution USE 1 VIAL IN NEBULIZER THREE TIMES DAILY AS NEEDED     albuterol (VENTOLIN HFA) 108 (90 Base) MCG/ACT inhaler Inhale 1-2 puffs into the lungs every 6 (six) hours as needed for wheezing or shortness of breath.     albuterol (VENTOLIN HFA) 108 (90 Base) MCG/ACT inhaler Inhale 2 puffs into the lungs every 4 (four) hours as needed. (Patient not taking: Reported on 08/17/2024)     amLODipine (NORVASC) 5 MG tablet Take 1 tablet every day by oral route for 90 days.     amLODipine (NORVASC) 5 MG tablet Take 5 mg by mouth daily. (Patient not taking: Reported on 08/17/2024)     amLODipine (NORVASC) 5 MG tablet Take 5 mg by mouth daily. (Patient not taking: Reported on 08/17/2024)     Borage, Borago officinalis, (BORAGE OIL PO) Take 1 capsule by mouth daily. (Patient not taking: Reported on 08/17/2024)     Cholecalciferol (VITAMIN D) 50 MCG (2000 UT) CAPS Take 4,000 Units by mouth daily.     clobetasol  ointment (TEMOVATE ) 0.05 % APPLY A THIN LAYER TO THE AFEFCTED AREA TWICE DAILY      famotidine  (PEPCID ) 20 MG tablet Take 1 tablet by mouth daily.     Flax Oil-Fish Oil-Borage Oil (FISH OIL-FLAX OIL-BORAGE OIL PO) Take by mouth. (Patient not taking: Reported on 08/17/2024)     Flax Oil-Fish Oil-Borage Oil CAPS Take by oral route. (Patient not taking: Reported on 08/17/2024)     FLAX OIL-FISH OIL-BORAGE OIL PO Take by oral route. (Patient not taking: Reported on 08/17/2024)     halobetasol (ULTRAVATE) 0.05 % cream APPLY CREAM TOPICALLY TO HANDS TWICE DAILY AS NEEDED. (NOT TO FACE, GROIN OR UNDERARMS)     lisinopril -hydrochlorothiazide  (ZESTORETIC ) 20-25 MG tablet  (Patient not taking: Reported on 08/17/2024)     mometasone-formoterol (DULERA) 100-5 MCG/ACT AERO Inhale 2 puffs twice a day by inhalation route. (Patient not taking: Reported on 08/17/2024)     mometasone-formoterol (DULERA) 100-5 MCG/ACT AERO Inhale 2 puffs twice a day by inhalation route. (Patient not taking: Reported on 08/17/2024)     pantoprazole  (PROTONIX ) 40 MG tablet Take 1 tablet (40 mg total) by mouth daily. 30 tablet 0   predniSONE  (DELTASONE ) 10 MG tablet TAKE 6 TABLETS ON DAY 1 THEN TAKE 5 TABLETS ON DAY 2 THEN TAKE 4 TABLETS ON DAY 3 THEN TAKE 3 TABLETS ON DAY 4 THEN TAKE 2 TABLETS ON DAY 5 THEN TAKE 1 TABLET ON DAY 6. TAKE ALL DOSES WITH FOOD. (  Patient not taking: Reported on 08/17/2024)     Roflumilast (ZORYVE) 0.15 % CREA APPLY TO THE AFFECTED AREA(S) BY TOPICAL ROUTE ONCE DAILY (Patient not taking: Reported on 08/17/2024)     triamcinolone cream (KENALOG) 0.1 % APPLY A THIN LAYER TO THE AFFECTED AREA(S) BY TOPICAL ROUTE 2 TIMES PER DAY (Patient not taking: Reported on 08/17/2024)     No current facility-administered medications for this visit.     Past Surgical History:  Procedure Laterality Date   ABDOMINAL HYSTERECTOMY     BIOPSY  10/15/2021   Procedure: BIOPSY;  Surgeon: Cindie Carlin POUR, DO;  Location: [facility];  Service: Endoscopy;;   COLONOSCOPY WITH PROPOFOL  N/A 10/15/2021    Procedure: COLONOSCOPY WITH PROPOFOL ;  Surgeon: Cindie Carlin POUR, DO;  Location: [facility];  Service: Endoscopy;  Laterality: N/A;  2:30 / ASA 3   POLYPECTOMY  10/15/2021   Procedure: POLYPECTOMY;  Surgeon: Cindie Carlin POUR, DO;  Location: [facility];  Service: Endoscopy;;   PORTACATH PLACEMENT     portacath removal       Allergies[2]    Family History  Problem Relation Age of Onset   Cancer Mother    Cancer Father    Cancer Sister    Cancer Brother      Social History Ms. Wellborn reports that she quit smoking about 4 years ago. Her smoking use included cigarettes. She has never used smokeless tobacco. [name] reports current alcohol use.    Physical Examination Today's Vitals   08/25/24 0855  BP: 120/72  Pulse: 78  SpO2: 98%  Weight: 235 lb (106.6 kg)  Height: 5' 6 (1.676 m)   Body mass index is 37.93 kg/m.  Gen: resting comfortably, no acute distress HEENT: no scleral icterus, pupils equal round and reactive, no palptable cervical adenopathy,  CV: RRR, no m/rg, no jvd Resp: Clear to auscultation bilaterally GI: abdomen is soft, non-tender, non-distended, normal bowel sounds, no hepatosplenomegaly MSK: extremities are warm, no edema.  Skin: warm, no rash Neuro:  no focal deficits Psych: appropriate affect    Assessment and Plan  1.Enlarged pulmonary artery - CT scan with enlarged pulmonary trunk/artery suggesting possible pulmonary HTN - will initiate workup with echocardiogram - pending echo results may warrant further evaluation. Given her weight and other signs/symptoms would likely consider sleep study - she has smoking history, on inhalers unclear if more diagnosis of COPD or asthmas based on listed history. I don't see formal PFTs in our system, will request from pcp - possibly VQ scan pending echo results  2.SOB - plan for echo - does have smoking history, request from pcp if has had prior PFTs - 30 lbs weight gain over 2.5 years, may be  a component of deconditioning as well       Dorn PHEBE Ross, M.D.     [1]  Allergies Allergen Reactions   Hydrocodone-Acetaminophen Hives    vicodin  [2]  Allergies Allergen Reactions   Hydrocodone-Acetaminophen Hives    vicodin   "

## 2024-08-25 NOTE — Patient Instructions (Signed)
 Medication Instructions:  Your physician recommends that you continue on your current medications as directed. Please refer to the Current Medication list given to you today.  *If you need a refill on your cardiac medications before your next appointment, please call your pharmacy*  Lab Work: None If you have labs (blood work) drawn today and your tests are completely normal, you will receive your results only by: MyChart Message (if you have MyChart) OR A paper copy in the mail If you have any lab test that is abnormal or we need to change your treatment, we will call you to review the results.  Testing/Procedures: Your physician has requested that you have an echocardiogram. Echocardiography is a painless test that uses sound waves to create images of your heart. It provides your doctor with information about the size and shape of your heart and how well your hearts chambers and valves are working. This procedure takes approximately one hour. There are no restrictions for this procedure. Please do NOT wear cologne, perfume, aftershave, or lotions (deodorant is allowed). Please arrive 15 minutes prior to your appointment time.  Please note: We ask at that you not bring children with you during ultrasound (echo/ vascular) testing. Due to room size and safety concerns, children are not allowed in the ultrasound rooms during exams. Our front office staff cannot provide observation of children in our lobby area while testing is being conducted. An adult accompanying a patient to their appointment will only be allowed in the ultrasound room at the discretion of the ultrasound technician under special circumstances. We apologize for any inconvenience.   Follow-Up: At [facility], you and your health needs are our priority.  As part of our continuing mission to provide you with exceptional heart care, our providers are all part of one team.  This team includes your primary Cardiologist  (physician) and Advanced Practice Providers or APPs (Physician Assistants and Nurse Practitioners) who all work together to provide you with the care you need, when you need it.  Your next appointment:   3 month(s)  Provider:   You may see Alvan Carrier, MD or one of the following Advanced Practice Providers on your designated Care Team:   Laymon Qua, PA-C  [place], [place] Olivia [place]avy, [place]     We recommend signing up for the patient portal called MyChart.  Sign up information is provided on this After Visit Summary.  MyChart is used to connect with patients for Virtual Visits (Telemedicine).  Patients are able to view lab/test results, encounter notes, upcoming appointments, etc.  Non-urgent messages can be sent to your provider as well.   To learn more about what you can do with MyChart, go to [email or web address].   Other Instructions Thank you for choosing [facility]!

## 2024-09-01 ENCOUNTER — Ambulatory Visit: Admitting: Nutrition

## 2024-09-14 ENCOUNTER — Encounter: Payer: Self-pay | Admitting: Nutrition

## 2024-09-17 ENCOUNTER — Other Ambulatory Visit: Payer: Self-pay

## 2024-09-17 NOTE — Patient Instructions (Signed)
 Visit Information  Thank you for taking time to visit with me today. Please don't hesitate to contact me if I can be of assistance to you before our next scheduled appointment.  Your next care management appointment is by telephone on 10/15/2024  at 9:30 AM   Telephone follow-up in 1 month  Please call the care guide team at [phone] if you need to cancel, schedule, or reschedule an appointment.   Please call the Suicide and Crisis Lifeline: 988 call the [place]  National Suicide Prevention Lifeline: 938-018-8049 or TTY: 787 104 9895 TTY (506) 876-8596) to talk to a trained counselor call 1-800-273-TALK (toll free, 24 hour hotline) call the [facility]: 3305090139 call 911 if you are experiencing a Mental Health or Behavioral Health Crisis or need someone to talk to.  Hendricks Her RN, BSN  [place] [facility]Population [facility] RN Case Information Systems Manager [phone]

## 2024-09-22 ENCOUNTER — Ambulatory Visit (HOSPITAL_COMMUNITY): Admission: RE | Admit: 2024-09-22 | Discharge: 2024-09-22 | Attending: Cardiology

## 2024-09-22 DIAGNOSIS — R0602 Shortness of breath: Secondary | ICD-10-CM | POA: Diagnosis not present

## 2024-09-22 LAB — ECHOCARDIOGRAM COMPLETE
Area-P 1/2: 2.99 cm2
S' Lateral: 3 cm

## 2024-09-22 NOTE — Progress Notes (Signed)
*  PRELIMINARY RESULTS* Echocardiogram 2D Echocardiogram has been performed.  Beth Barber 09/22/2024, 11:26 AM

## 2024-09-23 ENCOUNTER — Ambulatory Visit: Payer: Self-pay | Admitting: Cardiology

## 2024-09-23 DIAGNOSIS — R0602 Shortness of breath: Secondary | ICD-10-CM

## 2024-09-24 NOTE — Telephone Encounter (Signed)
-----   Message from Nurse Jon DEL sent at 09/24/2024  3:23 PM EST -----  ----- Message ----- From: Alvan Dorn FALCON, MD Sent: 09/23/2024  12:25 PM EST To: Jon KANDICE Potters, LPN  Echo shows normal heart pumping function. She has some mild age related stiffness of the heart muscle which is common and a minor finding. No evidence of pulmonary HTN by echo. Given her SOB please  order PFTs.  JINNY Alvan MD

## 2024-09-27 ENCOUNTER — Encounter: Payer: Self-pay | Admitting: Nutrition

## 2024-10-15 ENCOUNTER — Telehealth

## 2024-12-06 ENCOUNTER — Ambulatory Visit: Admitting: Cardiology

## 2025-03-23 ENCOUNTER — Other Ambulatory Visit

## 2025-03-30 ENCOUNTER — Ambulatory Visit: Admitting: Physician Assistant
